# Patient Record
Sex: Female | Born: 1935
Health system: Southern US, Community
[De-identification: ages and names within clinical notes are randomized; demographics above are authoritative.]

## PROBLEM LIST (undated history)

## (undated) ENCOUNTER — Emergency Department (HOSPITAL_COMMUNITY): Admission: EM | Payer: Self-pay | Source: Home / Self Care

## (undated) DIAGNOSIS — F329 Major depressive disorder, single episode, unspecified: Secondary | ICD-10-CM

## (undated) DIAGNOSIS — I8393 Asymptomatic varicose veins of bilateral lower extremities: Secondary | ICD-10-CM

## (undated) DIAGNOSIS — H269 Unspecified cataract: Secondary | ICD-10-CM

## (undated) DIAGNOSIS — M545 Low back pain, unspecified: Secondary | ICD-10-CM

## (undated) DIAGNOSIS — F3289 Other specified depressive episodes: Secondary | ICD-10-CM

## (undated) DIAGNOSIS — F411 Generalized anxiety disorder: Secondary | ICD-10-CM

## (undated) DIAGNOSIS — F17200 Nicotine dependence, unspecified, uncomplicated: Secondary | ICD-10-CM

## (undated) DIAGNOSIS — E785 Hyperlipidemia, unspecified: Secondary | ICD-10-CM

## (undated) DIAGNOSIS — K5900 Constipation, unspecified: Secondary | ICD-10-CM

## (undated) DIAGNOSIS — R413 Other amnesia: Secondary | ICD-10-CM

## (undated) DIAGNOSIS — I10 Essential (primary) hypertension: Secondary | ICD-10-CM

## (undated) DIAGNOSIS — E643 Sequelae of rickets: Secondary | ICD-10-CM

## (undated) HISTORY — DX: Essential (primary) hypertension: I10

## (undated) HISTORY — DX: Unspecified cataract: H26.9

## (undated) HISTORY — DX: Low back pain, unspecified: M54.50

## (undated) HISTORY — DX: Generalized anxiety disorder: F41.1

## (undated) HISTORY — DX: Low back pain: M54.5

## (undated) HISTORY — DX: Asymptomatic varicose veins of bilateral lower extremities: I83.93

## (undated) HISTORY — DX: Hyperlipidemia, unspecified: E78.5

## (undated) HISTORY — PX: CYST EXCISION: SHX5701

## (undated) HISTORY — DX: Sequelae of rickets: E64.3

## (undated) HISTORY — DX: Other specified depressive episodes: F32.89

## (undated) HISTORY — DX: Major depressive disorder, single episode, unspecified: F32.9

## (undated) HISTORY — DX: Constipation, unspecified: K59.00

## (undated) HISTORY — DX: Other amnesia: R41.3

## (undated) HISTORY — DX: Nicotine dependence, unspecified, uncomplicated: F17.200

---

## 1998-09-26 ENCOUNTER — Ambulatory Visit (HOSPITAL_COMMUNITY): Admission: RE | Admit: 1998-09-26 | Discharge: 1998-09-26 | Payer: Self-pay | Admitting: Gastroenterology

## 1999-11-08 ENCOUNTER — Encounter: Admission: RE | Admit: 1999-11-08 | Discharge: 1999-11-08 | Payer: Self-pay | Admitting: Family Medicine

## 1999-11-08 ENCOUNTER — Encounter: Payer: Self-pay | Admitting: Family Medicine

## 2000-11-16 ENCOUNTER — Other Ambulatory Visit: Admission: RE | Admit: 2000-11-16 | Discharge: 2000-11-16 | Payer: Self-pay | Admitting: Family Medicine

## 2001-04-08 ENCOUNTER — Encounter: Payer: Self-pay | Admitting: Family Medicine

## 2001-04-08 ENCOUNTER — Encounter: Admission: RE | Admit: 2001-04-08 | Discharge: 2001-04-08 | Payer: Self-pay | Admitting: Family Medicine

## 2001-12-24 ENCOUNTER — Other Ambulatory Visit: Admission: RE | Admit: 2001-12-24 | Discharge: 2001-12-24 | Payer: Self-pay | Admitting: Family Medicine

## 2002-01-13 ENCOUNTER — Encounter: Payer: Self-pay | Admitting: Family Medicine

## 2002-01-13 ENCOUNTER — Encounter: Admission: RE | Admit: 2002-01-13 | Discharge: 2002-01-13 | Payer: Self-pay | Admitting: Family Medicine

## 2002-01-14 ENCOUNTER — Encounter: Admission: RE | Admit: 2002-01-14 | Discharge: 2002-01-14 | Payer: Self-pay | Admitting: Family Medicine

## 2002-01-14 ENCOUNTER — Encounter: Payer: Self-pay | Admitting: Family Medicine

## 2002-02-24 ENCOUNTER — Other Ambulatory Visit: Admission: RE | Admit: 2002-02-24 | Discharge: 2002-02-24 | Payer: Self-pay | Admitting: *Deleted

## 2002-03-10 ENCOUNTER — Ambulatory Visit (HOSPITAL_COMMUNITY): Admission: RE | Admit: 2002-03-10 | Discharge: 2002-03-10 | Payer: Self-pay | Admitting: Gastroenterology

## 2002-03-10 ENCOUNTER — Encounter (INDEPENDENT_AMBULATORY_CARE_PROVIDER_SITE_OTHER): Payer: Self-pay | Admitting: Specialist

## 2002-04-20 ENCOUNTER — Ambulatory Visit: Admission: RE | Admit: 2002-04-20 | Discharge: 2002-04-20 | Payer: Self-pay | Admitting: *Deleted

## 2002-06-16 ENCOUNTER — Ambulatory Visit (HOSPITAL_COMMUNITY): Admission: RE | Admit: 2002-06-16 | Discharge: 2002-06-17 | Payer: Self-pay | Admitting: *Deleted

## 2002-06-16 ENCOUNTER — Encounter (INDEPENDENT_AMBULATORY_CARE_PROVIDER_SITE_OTHER): Payer: Self-pay | Admitting: *Deleted

## 2003-02-17 ENCOUNTER — Encounter: Payer: Self-pay | Admitting: Family Medicine

## 2003-02-17 ENCOUNTER — Encounter: Admission: RE | Admit: 2003-02-17 | Discharge: 2003-02-17 | Payer: Self-pay | Admitting: Family Medicine

## 2004-01-24 ENCOUNTER — Other Ambulatory Visit: Admission: RE | Admit: 2004-01-24 | Discharge: 2004-01-24 | Payer: Self-pay | Admitting: Family Medicine

## 2005-03-19 ENCOUNTER — Encounter (INDEPENDENT_AMBULATORY_CARE_PROVIDER_SITE_OTHER): Payer: Self-pay | Admitting: Specialist

## 2005-03-19 ENCOUNTER — Ambulatory Visit (HOSPITAL_COMMUNITY): Admission: RE | Admit: 2005-03-19 | Discharge: 2005-03-19 | Payer: Self-pay | Admitting: Gastroenterology

## 2005-05-12 ENCOUNTER — Ambulatory Visit (HOSPITAL_BASED_OUTPATIENT_CLINIC_OR_DEPARTMENT_OTHER): Admission: RE | Admit: 2005-05-12 | Discharge: 2005-05-13 | Payer: Self-pay | Admitting: Otolaryngology

## 2005-05-12 ENCOUNTER — Ambulatory Visit (HOSPITAL_COMMUNITY): Admission: RE | Admit: 2005-05-12 | Discharge: 2005-05-12 | Payer: Self-pay | Admitting: Otolaryngology

## 2005-05-12 ENCOUNTER — Encounter (INDEPENDENT_AMBULATORY_CARE_PROVIDER_SITE_OTHER): Payer: Self-pay | Admitting: *Deleted

## 2005-06-24 ENCOUNTER — Encounter: Admission: RE | Admit: 2005-06-24 | Discharge: 2005-06-24 | Payer: Self-pay | Admitting: Family Medicine

## 2005-07-11 ENCOUNTER — Encounter: Admission: RE | Admit: 2005-07-11 | Discharge: 2005-07-11 | Payer: Self-pay | Admitting: Family Medicine

## 2006-01-12 ENCOUNTER — Encounter: Admission: RE | Admit: 2006-01-12 | Discharge: 2006-01-12 | Payer: Self-pay | Admitting: Family Medicine

## 2006-09-29 ENCOUNTER — Encounter: Admission: RE | Admit: 2006-09-29 | Discharge: 2006-09-29 | Payer: Self-pay | Admitting: Family Medicine

## 2006-10-06 ENCOUNTER — Encounter: Admission: RE | Admit: 2006-10-06 | Discharge: 2006-10-06 | Payer: Self-pay | Admitting: Family Medicine

## 2006-10-06 ENCOUNTER — Encounter (INDEPENDENT_AMBULATORY_CARE_PROVIDER_SITE_OTHER): Payer: Self-pay | Admitting: Specialist

## 2007-10-14 ENCOUNTER — Encounter: Admission: RE | Admit: 2007-10-14 | Discharge: 2007-10-14 | Payer: Self-pay | Admitting: Family Medicine

## 2008-11-01 ENCOUNTER — Encounter: Admission: RE | Admit: 2008-11-01 | Discharge: 2008-11-01 | Payer: Self-pay | Admitting: Family Medicine

## 2008-11-07 ENCOUNTER — Encounter: Admission: RE | Admit: 2008-11-07 | Discharge: 2008-11-07 | Payer: Self-pay | Admitting: Family Medicine

## 2009-08-09 ENCOUNTER — Encounter: Admission: RE | Admit: 2009-08-09 | Discharge: 2009-08-09 | Payer: Self-pay | Admitting: Internal Medicine

## 2009-09-16 ENCOUNTER — Emergency Department (HOSPITAL_COMMUNITY): Admission: EM | Admit: 2009-09-16 | Discharge: 2009-09-16 | Payer: Self-pay | Admitting: Emergency Medicine

## 2009-09-19 ENCOUNTER — Inpatient Hospital Stay (HOSPITAL_COMMUNITY): Admission: EM | Admit: 2009-09-19 | Discharge: 2009-09-24 | Payer: Self-pay | Admitting: Emergency Medicine

## 2010-06-29 ENCOUNTER — Encounter: Payer: Self-pay | Admitting: *Deleted

## 2010-06-30 ENCOUNTER — Encounter: Payer: Self-pay | Admitting: Family Medicine

## 2010-08-27 LAB — CBC
Hemoglobin: 12 g/dL (ref 12.0–15.0)
MCHC: 36.8 g/dL — ABNORMAL HIGH (ref 30.0–36.0)
MCV: 91.6 fL (ref 78.0–100.0)
MCV: 93.4 fL (ref 78.0–100.0)
Platelets: 175 10*3/uL (ref 150–400)
RBC: 3.46 MIL/uL — ABNORMAL LOW (ref 3.87–5.11)
RDW: 14.6 % (ref 11.5–15.5)

## 2010-08-27 LAB — BASIC METABOLIC PANEL
BUN: 10 mg/dL (ref 6–23)
BUN: 9 mg/dL (ref 6–23)
CO2: 23 mEq/L (ref 19–32)
Calcium: 8.5 mg/dL (ref 8.4–10.5)
Chloride: 106 mEq/L (ref 96–112)
Chloride: 108 mEq/L (ref 96–112)
Creatinine, Ser: 0.89 mg/dL (ref 0.4–1.2)
GFR calc Af Amer: >60 mL/min (ref >60)
GFR calc Af Amer: >60 mL/min (ref >60)
Glucose, Bld: 103 mg/dL — ABNORMAL HIGH (ref 70–99)
Glucose, Bld: 110 mg/dL — ABNORMAL HIGH (ref 70–99)
Sodium: 138 mEq/L (ref 135–145)
Sodium: 139 mEq/L (ref 135–145)

## 2010-08-27 LAB — LIPASE, BLOOD
Lipase: 261 U/L — ABNORMAL HIGH (ref 11–59)
Lipase: 272 U/L — ABNORMAL HIGH (ref 11–59)

## 2010-08-28 LAB — COMPREHENSIVE METABOLIC PANEL
ALT: 18 U/L (ref 0–35)
ALT: 41 U/L — ABNORMAL HIGH (ref 0–35)
AST: 74 U/L — ABNORMAL HIGH (ref 0–37)
Albumin: 2.2 g/dL — ABNORMAL LOW (ref 3.5–5.2)
Albumin: 2.3 g/dL — ABNORMAL LOW (ref 3.5–5.2)
Alkaline Phosphatase: 41 U/L (ref 39–117)
Alkaline Phosphatase: 43 U/L (ref 39–117)
Alkaline Phosphatase: 45 U/L (ref 39–117)
Alkaline Phosphatase: 55 U/L (ref 39–117)
BUN: 21 mg/dL (ref 6–23)
BUN: 23 mg/dL (ref 6–23)
BUN: 38 mg/dL — ABNORMAL HIGH (ref 6–23)
CO2: 21 mEq/L (ref 19–32)
CO2: 24 mEq/L (ref 19–32)
CO2: 25 mEq/L (ref 19–32)
Calcium: 9 mg/dL (ref 8.4–10.5)
Chloride: 101 mEq/L (ref 96–112)
Chloride: 105 mEq/L (ref 96–112)
Chloride: 99 mEq/L (ref 96–112)
GFR calc Af Amer: 34 mL/min — ABNORMAL LOW (ref >60)
GFR calc non Af Amer: 28 mL/min — ABNORMAL LOW (ref >60)
GFR calc non Af Amer: 29 mL/min — ABNORMAL LOW (ref >60)
GFR calc non Af Amer: 29 mL/min — ABNORMAL LOW (ref >60)
Glucose, Bld: 103 mg/dL — ABNORMAL HIGH (ref 70–99)
Glucose, Bld: 118 mg/dL — ABNORMAL HIGH (ref 70–99)
Glucose, Bld: 118 mg/dL — ABNORMAL HIGH (ref 70–99)
Glucose, Bld: 125 mg/dL — ABNORMAL HIGH (ref 70–99)
Potassium: 3.3 mEq/L — ABNORMAL LOW (ref 3.5–5.1)
Potassium: 3.3 mEq/L — ABNORMAL LOW (ref 3.5–5.1)
Potassium: 3.4 mEq/L — ABNORMAL LOW (ref 3.5–5.1)
Potassium: 3.4 mEq/L — ABNORMAL LOW (ref 3.5–5.1)
Sodium: 132 mEq/L — ABNORMAL LOW (ref 135–145)
Sodium: 135 mEq/L (ref 135–145)
Total Bilirubin: 0.3 mg/dL (ref 0.3–1.2)
Total Bilirubin: 0.6 mg/dL (ref 0.3–1.2)
Total Bilirubin: 0.8 mg/dL (ref 0.3–1.2)
Total Protein: 8.1 g/dL (ref 6.0–8.3)

## 2010-08-28 LAB — LEGIONELLA ANTIGEN, URINE

## 2010-08-28 LAB — APTT: aPTT: 31 seconds (ref 24–37)

## 2010-08-28 LAB — CBC
HCT: 36.9 % (ref 36.0–46.0)
HCT: 42.3 % (ref 36.0–46.0)
Hemoglobin: 12.7 g/dL (ref 12.0–15.0)
Hemoglobin: 13.9 g/dL (ref 12.0–15.0)
Hemoglobin: 14.2 g/dL (ref 12.0–15.0)
MCHC: 33.7 g/dL (ref 30.0–36.0)
Platelets: 133 10*3/uL — ABNORMAL LOW (ref 150–400)
RBC: 3.98 MIL/uL (ref 3.87–5.11)
RBC: 4.57 MIL/uL (ref 3.87–5.11)
RBC: 4.62 MIL/uL (ref 3.87–5.11)
RDW: 13.6 % (ref 11.5–15.5)
WBC: 6.8 10*3/uL (ref 4.0–10.5)
WBC: 8 10*3/uL (ref 4.0–10.5)

## 2010-08-28 LAB — URINALYSIS, ROUTINE W REFLEX MICROSCOPIC
Bilirubin Urine: NEGATIVE
Nitrite: NEGATIVE
Specific Gravity, Urine: 1.023 (ref 1.005–1.030)
pH: 6 (ref 5.0–8.0)

## 2010-08-28 LAB — CULTURE, BLOOD (ROUTINE X 2)
Culture: NO GROWTH
Culture: NO GROWTH

## 2010-08-28 LAB — PROTEIN ELECTROPHORESIS, SERUM
Alpha-1-Globulin: 8.6 % — ABNORMAL HIGH (ref 2.9–4.9)
Alpha-2-Globulin: 18.1 % — ABNORMAL HIGH (ref 7.1–11.8)
Beta 2: 8.3 % — ABNORMAL HIGH (ref 3.2–6.5)
Gamma Globulin: 18 % (ref 11.1–18.8)

## 2010-08-28 LAB — CARDIAC PANEL(CRET KIN+CKTOT+MB+TROPI)
Relative Index: 1.6 (ref 0.0–2.5)
Troponin I: 0.01 ng/mL (ref 0.00–0.06)

## 2010-08-28 LAB — DIFFERENTIAL
Basophils Absolute: 0 10*3/uL (ref 0.0–0.1)
Basophils Absolute: 0 10*3/uL (ref 0.0–0.1)
Basophils Relative: 0 % (ref 0–1)
Basophils Relative: 0 % (ref 0–1)
Basophils Relative: 0 % (ref 0–1)
Eosinophils Absolute: 0 10*3/uL (ref 0.0–0.7)
Eosinophils Absolute: 0 10*3/uL (ref 0.0–0.7)
Eosinophils Relative: 0 % (ref 0–5)
Lymphs Abs: 1.6 10*3/uL (ref 0.7–4.0)
Monocytes Absolute: 0.2 10*3/uL (ref 0.1–1.0)
Monocytes Relative: 2 % — ABNORMAL LOW (ref 3–12)
Neutro Abs: 12.4 10*3/uL — ABNORMAL HIGH (ref 1.7–7.7)
Neutro Abs: 5 10*3/uL (ref 1.7–7.7)
Neutrophils Relative %: 77 % (ref 43–77)
Neutrophils Relative %: 84 % — ABNORMAL HIGH (ref 43–77)

## 2010-08-28 LAB — LIPASE, BLOOD: Lipase: 325 U/L — ABNORMAL HIGH (ref 11–59)

## 2010-08-28 LAB — BASIC METABOLIC PANEL
BUN: 39 mg/dL — ABNORMAL HIGH (ref 6–23)
CO2: 24 mEq/L (ref 19–32)
Glucose, Bld: 129 mg/dL — ABNORMAL HIGH (ref 70–99)
Potassium: 3.4 mEq/L — ABNORMAL LOW (ref 3.5–5.1)
Sodium: 130 mEq/L — ABNORMAL LOW (ref 135–145)

## 2010-08-28 LAB — EXPECTORATED SPUTUM ASSESSMENT W GRAM STAIN, RFLX TO RESP C

## 2010-08-28 LAB — CULTURE, RESPIRATORY W GRAM STAIN: Culture: NORMAL

## 2010-08-28 LAB — GLUCOSE, CAPILLARY

## 2010-08-28 LAB — URINE MICROSCOPIC-ADD ON

## 2010-08-28 LAB — UIFE/LIGHT CHAINS/TP QN, 24-HR UR
Alpha 2, Urine: DETECTED — AB
Beta, Urine: DETECTED — AB
Free Kappa Lt Chains,Ur: 25.5 mg/dL — ABNORMAL HIGH (ref 0.04–1.51)
Free Lt Chn Excr Rate: 306 mg/d

## 2010-08-28 LAB — TROPONIN I: Troponin I: 0.01 ng/mL (ref 0.00–0.06)

## 2010-08-28 LAB — URINE CULTURE

## 2010-08-28 LAB — HEPATITIS B SURFACE ANTIGEN: Hepatitis B Surface Ag: NEGATIVE

## 2010-10-01 ENCOUNTER — Inpatient Hospital Stay (INDEPENDENT_AMBULATORY_CARE_PROVIDER_SITE_OTHER)
Admission: RE | Admit: 2010-10-01 | Discharge: 2010-10-01 | Disposition: A | Discharge status: Home / Self Care | Payer: Medicare Other | Source: Ambulatory Visit | Attending: Emergency Medicine | Admitting: Emergency Medicine

## 2010-10-01 DIAGNOSIS — S139XXA Sprain of joints and ligaments of unspecified parts of neck, initial encounter: Secondary | ICD-10-CM

## 2010-10-01 DIAGNOSIS — S335XXA Sprain of ligaments of lumbar spine, initial encounter: Secondary | ICD-10-CM

## 2010-10-01 DIAGNOSIS — S239XXA Sprain of unspecified parts of thorax, initial encounter: Secondary | ICD-10-CM

## 2010-10-25 NOTE — Op Note (Signed)
NAME:  Carol [name], Carol [name]                    ACCOUNT NO.:  [id number]   MEDICAL RECORD NO.:  [id number]                   PATIENT TYPE:  AMB   LOCATION:  ENDO                                 FACILITY:  [facility]   PHYSICIAN:  Charna Elizabeth, M.D.                   DATE OF BIRTH:  1936-02-08   DATE OF PROCEDURE:  03/10/2002  DATE OF DISCHARGE:                                 OPERATIVE REPORT   PROCEDURE PERFORMED:  Colonoscopy with biopsies.   ENDOSCOPIST:  Charna Elizabeth, M.D.   INSTRUMENT USED:  Olympus video colonoscope.   INDICATIONS FOR PROCEDURE:  The patient is a 75 year old African-American  female with a history of adenomatous polyps removed three years ago.  Rule  out recurrent polyps.   PREPROCEDURE PREPARATION:  Informed consent was procured from the patient.  The patient was fasted for eight hours prior to the procedure and prepped  with a bottle of magnesium citrate and a gallon of NuLytely the night prior  to the procedure.   PREPROCEDURE PHYSICAL:  The patient had stable vital signs.  Neck supple.  Chest clear to auscultation.  S1 and S2 regular.  Abdomen soft with normal  bowel sounds.   DESCRIPTION OF PROCEDURE:  The patient was placed in left lateral decubitus  position and sedated with 60 mg of Demerol and 6 mg of Versed intravenously.  Once the patient was adequately sedated and maintained on low flow oxygen  and continuous cardiac monitoring, the Olympus video colonoscope was  advanced from the rectum to the cecum with difficulty.  The patient had a  very tortuous colon and the patient's position had to be changed from the  left lateral to supine and the right lateral position to adequately  visualize the cecal base.  Abdominal pressure was applied in different  locations. There was difficulty with entry of the scope into the cecum.  The  appendicular orifice and ileocecal valve appeared healthy and without  lesions.  A small sessile polyp was biopsied from the  proximal right colon.  The patient had a very tortuous colon.  There was no evidence of large  masses, polyps or diverticulosis.  The patient tolerated the procedure well  without complications.  No abnormalities were noted on retroflexion in the  rectum.   IMPRESSION:  1. A small sessile polyp biopsied from proximal right colon.  2. Very tortuous colon.  No diverticulosis, large masses or polyps seen.   RECOMMENDATIONS:  1. Await biopsy results.  2.     Repeat colorectal cancer screening in the next five years unless the patient     develops any abnormal symptoms in the interim.  3. Outpatient follow-up on a p.r.n. basis.  Charna Elizabeth, M.D.    JM/MEDQ  D:  03/10/2002  T:  03/11/2002  Job:  [id number]   cc:   Talmadge Coventry, M.D.

## 2010-10-25 NOTE — Op Note (Signed)
Carol [name], Carol [name]          ACCOUNT NO.:  [id number]   MEDICAL RECORD NO.:  [id number]          PATIENT TYPE:  AMB   LOCATION:  DSC                          FACILITY:  [facility]   PHYSICIAN:  Jefry H. Pollyann Kennedy, MD     DATE OF BIRTH:  03/16/1936   DATE OF PROCEDURE:  05/12/2005  DATE OF DISCHARGE:                                 OPERATIVE REPORT   PREOPERATIVE DIAGNOSIS:  Left parotid mass.   POSTOPERATIVE DIAGNOSIS:  Left parotid mass.   PROCEDURE:  Left superficial parotidectomy.   SURGEON:  Jefry H. Pollyann Kennedy, MD.   ASSISTANT:  Kinnie Scales. Annalee Genta, M.D.   ANESTHESIA:  General endotracheal.   COMPLICATIONS:  No complications.   BLOOD LOSS:  50 cc.   FINDINGS:  Large soft mass arising from the tail of the parotid on the left  side, inferior and deep to the mandibular branch of the facial nerve. No  complications.   REFERRING PHYSICIAN:  Talmadge Coventry, M.D.   HISTORY:  A 75 year old lady with an enlarging mass in the left parotid. 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 This one had  increase in size recently. Risks, benefits, alternatives and complications  of the procedure were explained to the patient's; she seemed to understand  and agreed to surgery.   PROCEDURE:  The patient was taken to the operating room, placed on the  operating table in a supine position. Following induction of general  endotracheal anesthesia, the table was turned 90 degrees. The patient was  positioned for parotid surgery. The face was prepped and draped in a sterile  fashion. An S-shaped incision was outlined the preauricular crease behind  the lobule, and then around the mastoid tip -- down to a cervical crease two  fingerbreadths below the angle of the mandible. Electrocautery was used to  incise through the skin and the superficial fascia. A flap was developed  anteriorly superficial to the  parotid fascia. The greater auricular nerve  posterior branch was identified, dissected and preserved. The parotid gland  tissue was dissected off of the upper sternocleidomastoid muscle. The gland  was then dissected off of the ear canal cartilage and down to the  tympanomeatal suture line. The posterior belly of the digastric was  identified. The main trunk of the facial nerve was then identified, and  dissection was then began following the main trunk to the pes anserinus. We  used McCabe dissector to dissect right down onto the lateral aspect of the  nerve, and the bipolar cautery used to cauterize the glandular tissue.  Superficial sharp dissection was used to divide the superficial from the  deep lobe at the plane of the nerve. The upper branches were dissected  first, followed by the lower branches -- all the way down to the tail of  parotid, where the tumor was identified. The tumor was then dissected out of  the surrounding tissue using blunt dissection. The entire specimen was then  sent for pathologic evaluation. Several vessels were encountered during the  dissection and were tied using silk ligatures. The wound was inspected for  bleeding and hemostasis was completed. The wound was then irrigated with  saline. A nerve stimulator was used to stimulate the main trunk and the  upper and lower division, and there was good stimulation in all branches.  The wound was closed in layers using 3-0 chromic on the deep layer and  running 5-0 nylon on the skin. A 7-French round JP drain was left in the  wound, exited through the inferior aspect of the incision, and secured in  place with nylon suture. Bacitracin was applied. The patient was then  awakened, extubated and transferred to recovery.      Jefry H. Pollyann Kennedy, MD  Electronically Signed     JHR/MEDQ  D:  05/12/2005  T:  05/12/2005  Job:  [id number]   cc:   Talmadge Coventry, M.D.  Fax: [phone]

## 2010-10-25 NOTE — Op Note (Signed)
Carol [name], Carol [name] NO.:  [id number]   MEDICAL RECORD NO.:  [id number]          PATIENT TYPE:  AMB   LOCATION:  ENDO                         FACILITY:  [facility]   PHYSICIAN:  Anselmo Rod, M.D.  DATE OF BIRTH:  1936-01-05   DATE OF PROCEDURE:  03/19/2005  DATE OF DISCHARGE:  03/19/2005                                 OPERATIVE REPORT   PROCEDURE PERFORMED:  Colonoscopy with biopsies.   ENDOSCOPIST:  Anselmo Rod, M.D.   INSTRUMENT USED:  Olympus video colonoscope.   INDICATIONS FOR PROCEDURE:  A 75 year old female undergoing a screening  coloscopy to rule out colonic polyps, masses, etc.  The patient has a  personal history of tubular adenomas in the colon, occasional rectal  bleeding, rule out recurrent polyps, masses, ect.   PRE-PROCEDURE PREPARATION:  Informed consent was procured from the patient.  The patient was fasted for eight hours prior to the procedure and prepped  with a bottle of magnesium citrate and a gallon of GoLYTELY the night prior  to the procedure.  The risks and benefits of the procedure including a 10%  misread of cancer and polyps were discussed with the patient as well.   PRE-PROCEDURE PHYSICAL:  The patient had stable vital signs.  Neck supple.  Chest clear to auscultation.  S1 S2 regular.  Abdomen soft with normal bowel  sounds.   DESCRIPTION OF THE PROCEDURE:  The patient was placed in the left lateral  decubitus position and sedated with 80 mg of Demerol and 9 mg of Versed in  slow incremental doses.  Once the patient was adequately sedated and  maintained on low flow oxygen and continuous cardiac monitoring, the Olympus  video colonoscope was advanced from the rectum to the cecum.  A small  sessile polyp was biopsied in the rectosigmoid colon.  One cecal polyp was  biopsied as well.  Four small sessile polyps were biopsied from the proximal  right colon.  The cecal polyp and the polyps from the proximal right colon  were  placed in one bottle.  Retroflexion in the rectum revealed small  internal hemorrhoids.  No large masses or polyps seen.  There was no  evidence of diverticulosis.  The patient tolerated the procedure well  without immediate complications.   IMPRESSION:  1.  Small internal hemorrhoids.  2.  Small sessile polyp biopsied from the rectosigmoid colon, four from the      proximal right colon and one from the cecum.   RECOMMENDATIONS:  1.  Await pathology results.  2.  Repeat colonoscopy depending on pathology results.  3.  Avoid all nonsteroidals including aspirin for the next two weeks.  4.  Continue a high fiber diet with liberal fluid intake.  5.  Outpatient followup as need arises in the future.      Anselmo Rod, M.D.  Electronically Signed     JNM/MEDQ  D:  03/21/2005  T:  03/21/2005  Job:  [id number]   cc:   Talmadge Coventry, M.D.  Fax: [phone]

## 2010-10-25 NOTE — Op Note (Signed)
NAME:  Carol [name], Carol [name]                    ACCOUNT NO.:  [id number]   MEDICAL RECORD NO.:  [id number]                   PATIENT TYPE:  OIB   LOCATION:  2550                                 FACILITY:  [facility]   PHYSICIAN:  Veverly Fells. Arletha Grippe, M.D.             DATE OF BIRTH:  10-30-1935   DATE OF PROCEDURE:  06/16/2002  DATE OF DISCHARGE:                                 OPERATIVE REPORT   PREOPERATIVE DIAGNOSIS:  Right parotid neoplasm.   POSTOPERATIVE DIAGNOSIS:  Right parotid neoplasm.   PROCEDURE:  Right lateral parotidectomy with facial nerve preservation and  monitoring of facial nerve with the XOMED MIMS facial nerve monitor for two  hours.   SURGEON:  Veverly Fells. Arletha Grippe, M.D.   ASSISTANT:  Kinnie Scales. Annalee Genta, M.D.   ANESTHESIA:  General endotracheal.   INDICATIONS:  This is a 75 year old black female, patient of Dr. Talmadge Coventry, with a history of a slowly enlarging mass involving the right  parotid gland for the past 6-12 months.  This is confirmed on examination  and by MRI of the neck which did show about a 3 x 4 cm mass which involving  the tail of the parotid gland.  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  Based on her history and physical examination,  I have recommended proceeding with the above noted surgical procedure.  I  have discussed extensively with the patient and the family the risks and  benefits of the surgery, including anesthesia, infection, bleeding, injury  to facial nerve and numbness of the face and earlobe.  I also discussed with  them the type of incision we would use, the type of drain we would use and  normal recovery period expected after this type of surgery.  We entertained  any questions and answered them appropriately.  Informed was obtained, and  the patient was consented for the procedure.   OPERATIVE FINDINGS:  Large benign-appearing mass involving the tail of the  parotid gland  approximately 3 x 4 cm dimension.  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   DESCRIPTION OF PROCEDURE:  The patient was brought into the operating room  and in the placed supine position.  General endotracheal anesthesia was  administered via the anesthesiologist without complications.  The shoulders  were placed on a shoulder roller.  Head was placed in a donut.  Head of the  table was turned 90 degrees such that the right side was exposed.  The XOMED  MIMS monitor was then hooked up to the patient per protocol to monitor the  function of the facial nerve on the right side.  After this was done, the  patient's right face and neck were sterilely prepped and draped in the  standard fashion.  A doubly modified Blair incision was then marked out over  the patient's right neck and was injected with a total of 10 cubic  centimeters  of 1% lidocaine solution, 1:100,000 epinephrine.  The facial  incision was made with the scalpel.  Light dissection was carried down  through the subcutaneous tissue until parotid fascia was identified.  Standard face lift flap was then elevated anteriorly using sharp dissection  and then down inferiorly to expose anterior corner of SCM muscle.  A little  bit of dissection was also carried out posteriorly.  The large tumor which  involved the tail of the parotid gland was then sharply excised off of the  anterior border of the SCM and off inferiorly and posteriorly off of the  posterior biliary digastric muscle.  Pharyngeal cartilage was identified and  parotid tissue was then both bluntly and sharply divided off the tracheal  cartilage until all mastoid suture was identified in this area.  The main  branch of the facial nerve was identified and was stimulated with a XOMED  MIMS monitor with good response.  The main branch was then dissected from  the parotid mass, and the upper division but was not traced out anteriorly  because of the location of the  tumor.  All three branches of the inferior  division were traced out entirely throughout the substance of the gland.  The gland was divided sharply overlying the inferior branch of the facial  nerve and no branches were sacrificed.  After this was done, the posterior  facial vein was identified and was divided between hemostats and tied with 3-  0 silk ties.  The great auricular nerve was also divided inferiorly and  sharply.  The tumor and portion of surrounding normal gland were then  excised sharply not to injure the facial nerve, and the specimen was then  removed and sent to surgical pathology for frozen section analysis.  This  came back consistent with a Warthin's tumor. Bleeding was controlled with  bipolar cautery.  The wound was then irrigated with copious amounts of  irrigation.  Fluid suctioned dry.  There was no evidence of any active  bleeding.  The main branch in all divisions that were exposed to the fascial  nerve were stimulated with the XOMED MIMS facial nerve stimulator with good  response of all branches.  After this was done, a fully fluted 10 mm Blake  drain was placed within the wound, brought up through a separate stab  incision posteriorly and then was sutured to the neck skin with a 2-0 silk  stitch.  The flap was then placed back in normal position.  The incision was  closed in multiple interrupted 4-0 Vicryl sutures and final skin closure was  achieved with a combination of running interlocking 5-0 and 4-0 nylon  stitch.  Bacitracin ointment was placed over the wound.  Fluids given during  the procedure were approximately 1100 cubic centimeters crystalloid.  Estimated blood loss was less than 50 cubic centimeters.  Urine output was  not measured.  The above noted closed suction drain was placed.  The  specimen sent was the right lateral parotid gland, and there were no packs.  The patient tolerated the procedure well without complications and was extubated in  the operating room and transferred to recovery in stable  condition.  Sponge, needle and instrument counts were correct at the end of  the procedure.  Total duration of the procedure is approximately 2-1/2  hours.   DISCHARGE INSTRUCTIONS:  The patient will be admitted for overnight  recovery.  Once determined well she will be sent home on June 17, 2002.  She is to have regular activity and diet after surgery.  Both her and her  family will be given oral and written instructions.  They are to call for  any problems with bleeding, fever, vomiting or pain, for any extra  medications, or any questions.  She will follow up in the office for suture  removal and postoperative check on June 23, 2001, at 1:15 p.m.   DISCHARGE MEDICATIONS:  1. She will be sent home on Levaquin 500 mg p.o. q.d. x 10 days.  2. Vicodin #30 two refills one to two tablets q.4h. p.r.n.                                               Veverly Fells. Arletha Grippe, M.D.    MDR/MEDQ  D:  06/16/2002  T:  06/16/2002  Job:  [id number]   cc:   Talmadge Coventry, M.D.  [address]  [place]  [place] [address]  Fax: [phone]

## 2010-10-26 ENCOUNTER — Inpatient Hospital Stay (INDEPENDENT_AMBULATORY_CARE_PROVIDER_SITE_OTHER)
Admission: RE | Admit: 2010-10-26 | Discharge: 2010-10-26 | Disposition: A | Discharge status: Home / Self Care | Payer: BC Managed Care – PPO | Source: Ambulatory Visit | Attending: Family Medicine | Admitting: Family Medicine

## 2010-10-26 DIAGNOSIS — N390 Urinary tract infection, site not specified: Secondary | ICD-10-CM

## 2010-10-26 LAB — POCT URINALYSIS DIP (DEVICE)
Nitrite: POSITIVE — AB
Protein, ur: >=300 mg/dL — AB
Urobilinogen, UA: 0.2 mg/dL (ref 0.0–1.0)

## 2011-12-04 ENCOUNTER — Other Ambulatory Visit: Payer: Self-pay | Admitting: Internal Medicine

## 2011-12-04 DIAGNOSIS — Z780 Asymptomatic menopausal state: Secondary | ICD-10-CM

## 2011-12-19 ENCOUNTER — Other Ambulatory Visit: Payer: Medicare Other

## 2011-12-22 ENCOUNTER — Ambulatory Visit
Admission: RE | Admit: 2011-12-22 | Discharge: 2011-12-22 | Disposition: A | Discharge status: Home / Self Care | Payer: Medicare Other | Source: Ambulatory Visit | Attending: Internal Medicine | Admitting: Internal Medicine

## 2011-12-22 DIAGNOSIS — Z780 Asymptomatic menopausal state: Secondary | ICD-10-CM

## 2011-12-22 LAB — HM DEXA SCAN

## 2012-04-06 ENCOUNTER — Other Ambulatory Visit: Payer: Self-pay | Admitting: Internal Medicine

## 2012-04-06 DIAGNOSIS — Z1231 Encounter for screening mammogram for malignant neoplasm of breast: Secondary | ICD-10-CM

## 2012-05-10 ENCOUNTER — Ambulatory Visit
Admission: RE | Admit: 2012-05-10 | Discharge: 2012-05-10 | Disposition: A | Discharge status: Home / Self Care | Payer: Medicare Other | Source: Ambulatory Visit | Attending: Internal Medicine | Admitting: Internal Medicine

## 2012-05-10 DIAGNOSIS — Z1231 Encounter for screening mammogram for malignant neoplasm of breast: Secondary | ICD-10-CM

## 2012-05-10 LAB — HM MAMMOGRAPHY: HM Mammogram: NORMAL

## 2013-01-24 ENCOUNTER — Encounter: Payer: Self-pay | Admitting: *Deleted

## 2013-01-24 ENCOUNTER — Other Ambulatory Visit: Payer: Self-pay | Admitting: *Deleted

## 2013-01-25 ENCOUNTER — Encounter: Payer: Self-pay | Admitting: Internal Medicine

## 2013-01-25 ENCOUNTER — Other Ambulatory Visit: Payer: Self-pay | Admitting: Internal Medicine

## 2013-01-25 ENCOUNTER — Ambulatory Visit (INDEPENDENT_AMBULATORY_CARE_PROVIDER_SITE_OTHER): Payer: 59 | Admitting: Internal Medicine

## 2013-01-25 VITALS — BP 136/82 | HR 78 | Temp 98.2°F | Resp 14 | Wt 173.6 lb

## 2013-01-25 DIAGNOSIS — Z720 Tobacco use: Secondary | ICD-10-CM

## 2013-01-25 DIAGNOSIS — K5900 Constipation, unspecified: Secondary | ICD-10-CM

## 2013-01-25 DIAGNOSIS — I10 Essential (primary) hypertension: Secondary | ICD-10-CM

## 2013-01-25 DIAGNOSIS — F0150 Vascular dementia without behavioral disturbance: Secondary | ICD-10-CM

## 2013-01-25 DIAGNOSIS — F0280 Dementia in other diseases classified elsewhere without behavioral disturbance: Secondary | ICD-10-CM

## 2013-01-25 DIAGNOSIS — M948X9 Other specified disorders of cartilage, unspecified sites: Secondary | ICD-10-CM

## 2013-01-25 DIAGNOSIS — F17200 Nicotine dependence, unspecified, uncomplicated: Secondary | ICD-10-CM

## 2013-01-25 DIAGNOSIS — E785 Hyperlipidemia, unspecified: Secondary | ICD-10-CM

## 2013-01-25 MED ORDER — POLYETHYLENE GLYCOL 3350 17 GM/SCOOP PO POWD: 17.0000 g | Freq: Every day | ORAL | Status: DC

## 2013-01-25 MED ORDER — POLYETHYLENE GLYCOL 3350 17 GM/SCOOP PO POWD: 17.0000 g | Freq: Two times a day (BID) | ORAL | Status: DC | PRN

## 2013-01-25 NOTE — Patient Instructions (Signed)
No driving until I see her in the office next and she has cleared the driving test

## 2013-01-25 NOTE — Progress Notes (Signed)
Patient ID: Carol [name], female   DOB: 06/12/1935, 77 y.o.   MRN: [id number]  Code status- full code  HPI 77 y/o female patient seen today after 10 months. Her daughters are here with her today. She denies any complaints. She has hx of dementia but is able to carry out pleasant conversation Family noticed that she has been forgetting directions while driving. No recent road accidents. She has been more repetitive recently with family members She is going days without bathing or changing her clothes and refuses to admit it. Daughters are concerned for her No falls reported She lives at home with her husband in one level housing  Review of Systems  Constitutional: Negative for fever, chills, malaise/fatigue and diaphoresis.  HENT: Positive for congestion. Negative for ear pain and sore throat.   Eyes: Negative for blurred vision and double vision.       Wears corrective lenses  Respiratory: Negative for cough and shortness of breath.   Cardiovascular: Negative for chest pain, palpitations, claudication and leg swelling.  Gastrointestinal: Negative for heartburn, nausea, vomiting, abdominal pain and constipation.  Genitourinary: Negative for dysuria and flank pain.  Musculoskeletal: Negative for joint pain and falls.  Skin: Negative for rash.  Neurological: Negative for dizziness, seizures, loss of consciousness and headaches.  Psychiatric/Behavioral: Positive for memory loss. Negative for depression. The patient is not nervous/anxious and does not have insomnia.      BP 136/82  Pulse 78  Temp(Src) 98.2 F (36.8 C) (Oral)  Resp 14  Wt 173 lb 9.6 oz (78.744 kg)  Physical Exam  Constitutional: She is oriented to person, place, and time. She appears well-developed and well-nourished. No distress.  HENT:  Head: Normocephalic and atraumatic.  Mouth/Throat: Oropharynx is clear and moist.  Eyes: Conjunctivae and EOM are normal. Pupils are equal, round, and reactive to light.   Neck: Normal range of motion. Neck supple. No JVD present.  Cardiovascular: Normal rate, regular rhythm, normal heart sounds and intact distal pulses.   No murmur heard. Pulmonary/Chest: Effort normal and breath sounds normal. No respiratory distress. She has no wheezes. She has no rales. She exhibits no tenderness.  Abdominal: Soft. Bowel sounds are normal. She exhibits no distension and no mass. There is no tenderness. There is no guarding.  Musculoskeletal: Normal range of motion. She exhibits no edema and no tenderness.  Lymphadenopathy:    She has no cervical adenopathy.  Neurological: She is alert and oriented to person, place, and time. No cranial nerve deficit.  Skin: Skin is warm and dry. She is not diaphoretic.  Psychiatric: She has a normal mood and affect. Her behavior is normal.   Assessment/plan  Mixed dementia- worsening recently. Will have her not drive until gives a driving test with DMV office. Will get tsh and b12 level checked to rule out reversible causes. Talked with daughter in detail about course and progression of dementia and her eventually needing assisted living vs nursing home. Pt refuses any assistance at home for now  HTN- continue maxzide for now, monitor bp and will check cmp  Vitamin d def- check vitamin d level. Continue calcium- vitamin d supplement   Constipation- with her being constipated, will add miralax 17 g daily to her current colace regimen  Allergic rhinitis- continue clarinex for now  Hyperlipidemia- check lipid panel  Tobacco user- does not want to quit smoking.   Labs- cbc, cmp, flp, tsh, b12

## 2013-01-26 ENCOUNTER — Encounter: Payer: Self-pay | Admitting: *Deleted

## 2013-01-26 DIAGNOSIS — F17200 Nicotine dependence, unspecified, uncomplicated: Secondary | ICD-10-CM | POA: Insufficient documentation

## 2013-01-26 DIAGNOSIS — Z720 Tobacco use: Secondary | ICD-10-CM | POA: Insufficient documentation

## 2013-01-26 DIAGNOSIS — M948X9 Other specified disorders of cartilage, unspecified sites: Secondary | ICD-10-CM | POA: Insufficient documentation

## 2013-01-26 DIAGNOSIS — F329 Major depressive disorder, single episode, unspecified: Secondary | ICD-10-CM | POA: Insufficient documentation

## 2013-01-26 DIAGNOSIS — I10 Essential (primary) hypertension: Secondary | ICD-10-CM | POA: Insufficient documentation

## 2013-01-26 DIAGNOSIS — F0150 Vascular dementia without behavioral disturbance: Secondary | ICD-10-CM | POA: Insufficient documentation

## 2013-01-26 DIAGNOSIS — K5900 Constipation, unspecified: Secondary | ICD-10-CM | POA: Insufficient documentation

## 2013-01-26 DIAGNOSIS — E785 Hyperlipidemia, unspecified: Secondary | ICD-10-CM | POA: Insufficient documentation

## 2013-01-27 LAB — LIPID PANEL
Cholesterol, Total: 210 mg/dL — ABNORMAL HIGH (ref 100–199)
HDL: 60 mg/dL (ref >39)
Triglycerides: 124 mg/dL (ref 0–149)
VLDL Cholesterol Cal: 25 mg/dL (ref 5–40)

## 2013-01-27 LAB — COMPREHENSIVE METABOLIC PANEL
ALT: 7 IU/L (ref 0–32)
CO2: 24 mmol/L (ref 18–29)
Calcium: 9.6 mg/dL (ref 8.6–10.2)
Chloride: 104 mmol/L (ref 97–108)
Glucose: 86 mg/dL (ref 65–99)
Potassium: 4.2 mmol/L (ref 3.5–5.2)
Total Protein: 7 g/dL (ref 6.0–8.5)

## 2013-01-27 LAB — CBC WITH DIFFERENTIAL/PLATELET
Basos: 1 % (ref 0–3)
Eosinophils Absolute: 0.1 10*3/uL (ref 0.0–0.4)
Immature Grans (Abs): 0 10*3/uL (ref 0.0–0.1)
MCH: 29.9 pg (ref 26.6–33.0)
MCV: 89 fL (ref 79–97)
Monocytes Absolute: 0.5 10*3/uL (ref 0.1–0.9)
Neutrophils Relative %: 47 % (ref 40–74)
RBC: 4.48 x10E6/uL (ref 3.77–5.28)
RDW: 13.9 % (ref 12.3–15.4)

## 2013-03-01 ENCOUNTER — Encounter: Payer: Self-pay | Admitting: Internal Medicine

## 2013-03-01 ENCOUNTER — Ambulatory Visit (INDEPENDENT_AMBULATORY_CARE_PROVIDER_SITE_OTHER): Payer: 59 | Admitting: Internal Medicine

## 2013-03-01 VITALS — BP 140/92 | HR 77 | Temp 98.8°F | Resp 18 | Wt 177.4 lb

## 2013-03-01 DIAGNOSIS — F29 Unspecified psychosis not due to a substance or known physiological condition: Secondary | ICD-10-CM

## 2013-03-01 DIAGNOSIS — F0150 Vascular dementia without behavioral disturbance: Secondary | ICD-10-CM

## 2013-03-01 DIAGNOSIS — I10 Essential (primary) hypertension: Secondary | ICD-10-CM

## 2013-03-01 DIAGNOSIS — K5900 Constipation, unspecified: Secondary | ICD-10-CM

## 2013-03-01 DIAGNOSIS — Z23 Encounter for immunization: Secondary | ICD-10-CM

## 2013-03-01 DIAGNOSIS — F0280 Dementia in other diseases classified elsewhere without behavioral disturbance: Secondary | ICD-10-CM

## 2013-03-01 MED ORDER — DONEPEZIL HCL 5 MG PO TABS: 5.0000 mg | ORAL_TABLET | Freq: Every evening | ORAL | Status: DC | PRN

## 2013-03-01 MED ORDER — POLYETHYLENE GLYCOL 3350 17 GM/SCOOP PO POWD: 17.0000 g | Freq: Every day | ORAL | Status: DC

## 2013-03-01 MED ORDER — QUETIAPINE FUMARATE 25 MG PO TABS: 25.0000 mg | ORAL_TABLET | Freq: Every day | ORAL | Status: DC

## 2013-03-01 MED ORDER — TRIAMTERENE-HCTZ 75-50 MG PO TABS: ORAL_TABLET | ORAL | Status: DC

## 2013-03-01 NOTE — Progress Notes (Signed)
Passed clock drawing 

## 2013-03-01 NOTE — Progress Notes (Signed)
Patient ID: Carol [name], female   DOB: Oct 21, 1935, 77 y.o.   MRN: [id number]  Chief Complaint  Patient presents with  . Follow-up   HPI 77 y/o female patient seen today for routine visit. Her 4 daughters are here with her today. She denies any complaints. She has hx of dementia but is able to carry out pleasant conversation. She continues to be forgetful and tends to repeat things. She also has been agitated at times when things does not happen the way she plans and family have noticed behavioral changes like getting angry easily, being mean to her daughters which is new fo few months on and off.  She is going days without bathing or changing her clothes and refuses to admit it. No falls reported She lives at home with her husband in one level housing Taking her medications  Review of Systems  Constitutional: Negative for fever, chills, malaise/fatigue and diaphoresis.  HENT: Negative for ear pain, chest congestion and sore throat.   Eyes: Negative for blurred vision and double vision.        Wears corrective lenses  Respiratory: Negative for cough and shortness of breath.   Cardiovascular: Negative for chest pain, palpitations, claudication and leg swelling.  Gastrointestinal: Negative for heartburn, nausea, vomiting, abdominal pain and constipation.  Genitourinary: Negative for dysuria and flank pain.  Musculoskeletal: Negative for joint pain and falls.  Skin: Negative for rash.  Neurological: Negative for dizziness, seizures, loss of consciousness and headaches.  Psychiatric/Behavioral: Positive for memory loss. Negative for depression. The patient is not nervous/anxious and does not have insomnia.    Past Medical History  Diagnosis Date  . Cataract   . Rickets, late effect   . Other and unspecified hyperlipidemia   . Anxiety state, unspecified   . Tobacco use disorder   . Depressive disorder, not elsewhere classified   . Unspecified essential hypertension   . Memory  loss   . Lumbago   . Unspecified constipation   . Varicose veins of lower extremities    Past Surgical History  Procedure Laterality Date  . Cyst excision Bilateral     behind ears   Medication reviewed  BP 140/92  Pulse 77  Temp(Src) 98.8 F (37.1 C) (Oral)  Resp 18  Wt 177 lb 6.4 oz (80.468 kg)  SpO2 97%  Physical Exam  Constitutional: She is oriented to person, place, and time. She appears well-developed and well-nourished. No distress.  HENT:   Head: Normocephalic and atraumatic.   Mouth/Throat: Oropharynx is clear and moist.  Eyes: Conjunctivae and EOM are normal. Pupils are equal, round, and reactive to light.  Neck: Normal range of motion. Neck supple. No JVD present.  Cardiovascular: Normal rate, regular rhythm, normal heart sounds and intact distal pulses.    No murmur heard. Pulmonary/Chest: Effort normal and breath sounds normal. No respiratory distress. She has no wheezes. She has no rales. She exhibits no tenderness.  Abdominal: Soft. Bowel sounds are normal. She exhibits no distension and no mass. There is no tenderness. There is no guarding.  Musculoskeletal: Normal range of motion. She exhibits no edema and no tenderness.  Lymphadenopathy:    She has no cervical adenopathy.  Neurological: She is alert and oriented to person, place, and time. No cranial nerve deficit.  Skin: Skin is warm and dry. She is not diaphoretic.  Psychiatric: She has a normal mood and affect. Her behavior is normal.   Labs-  CBC    Component Value Date/Time  WBC 8.2 01/25/2013 1541   WBC 8.2 09/22/2009 0607   RBC 4.48 01/25/2013 1541   RBC 3.46* 09/22/2009 0607   HGB 13.4 01/25/2013 1541   HCT 39.7 01/25/2013 1541   PLT 197 09/22/2009 0607   MCV 89 01/25/2013 1541   MCH 29.9 01/25/2013 1541   MCHC 33.8 01/25/2013 1541   MCHC 36.8* 09/22/2009 0607   RDW 13.9 01/25/2013 1541   RDW 15.0 09/22/2009 0607   LYMPHSABS 3.6* 01/25/2013 1541   LYMPHSABS 1.5 09/19/2009 0623   MONOABS 0.2  09/19/2009 0623   EOSABS 0.1 01/25/2013 1541   EOSABS 0.1 09/19/2009 0623   BASOSABS 0.0 01/25/2013 1541   BASOSABS 0.0 09/19/2009 0623    CMP     Component Value Date/Time   NA 143 01/25/2013 1541   NA 138 09/22/2009 0607   K 4.2 01/25/2013 1541   CL 104 01/25/2013 1541   CO2 24 01/25/2013 1541   GLUCOSE 86 01/25/2013 1541   GLUCOSE 103* 09/22/2009 0607   BUN 12 01/25/2013 1541   BUN 9 09/22/2009 0607   CREATININE 0.88 01/25/2013 1541   CALCIUM 9.6 01/25/2013 1541   PROT 7.0 01/25/2013 1541   PROT 6.0 09/20/2009 0653   ALBUMIN 2.2* 09/20/2009 0653   AST 14 01/25/2013 1541   ALT 7 01/25/2013 1541   ALKPHOS 67 01/25/2013 1541   BILITOT 0.2 01/25/2013 1541   GFRNONAA 64 01/25/2013 1541   GFRAA 74 01/25/2013 1541   Normal thyroid level and b12  Assessment/plan  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 Reversible causes ruled out.But with new behavior changes and mild cognitive impairment, will have her started on donepezil 5 mg daily at bedtime and seroquel 25 mg daily at bedtme. Common side effects with this medication explained. Will also schedule ct head w/o contrast to help rule out vascular infarcts// ischemia (i would expect some given her long standing HTN)  Psychosis- will have her on seroquel 25 mg daily and reassess  HTN- bp elevated this visit but patient mentions being bothered by all 4 daughters present in the room. bp normal at home as per daughter. continue maxzide for now, monitor bp  Vitamin d def- normal vitamin d. Continue calcium- vitamin d supplement   Constipation- regular bowel movement with current regimen

## 2013-03-10 ENCOUNTER — Ambulatory Visit
Admission: RE | Admit: 2013-03-10 | Discharge: 2013-03-10 | Disposition: A | Discharge status: Home / Self Care | Payer: Medicare HMO | Source: Ambulatory Visit | Attending: Internal Medicine | Admitting: Internal Medicine

## 2013-03-10 DIAGNOSIS — I10 Essential (primary) hypertension: Secondary | ICD-10-CM

## 2013-03-10 DIAGNOSIS — F0150 Vascular dementia without behavioral disturbance: Secondary | ICD-10-CM

## 2013-03-29 ENCOUNTER — Ambulatory Visit: Payer: Self-pay | Admitting: Internal Medicine

## 2013-04-13 ENCOUNTER — Ambulatory Visit: Payer: 59 | Admitting: Internal Medicine

## 2013-04-13 ENCOUNTER — Ambulatory Visit (INDEPENDENT_AMBULATORY_CARE_PROVIDER_SITE_OTHER): Payer: 59 | Admitting: Internal Medicine

## 2013-04-13 ENCOUNTER — Encounter: Payer: Self-pay | Admitting: Internal Medicine

## 2013-04-13 VITALS — BP 150/86 | HR 65 | Temp 98.3°F | Wt 177.4 lb

## 2013-04-13 DIAGNOSIS — F0150 Vascular dementia without behavioral disturbance: Secondary | ICD-10-CM

## 2013-04-13 DIAGNOSIS — F0280 Dementia in other diseases classified elsewhere without behavioral disturbance: Secondary | ICD-10-CM

## 2013-04-13 DIAGNOSIS — I10 Essential (primary) hypertension: Secondary | ICD-10-CM

## 2013-04-13 DIAGNOSIS — F29 Unspecified psychosis not due to a substance or known physiological condition: Secondary | ICD-10-CM

## 2013-04-13 MED ORDER — RIVASTIGMINE 9.5 MG/24HR TD PT24: 9.5000 mg | MEDICATED_PATCH | Freq: Every day | TRANSDERMAL | Status: DC

## 2013-04-13 NOTE — Progress Notes (Signed)
Patient ID: Carol [name], female   DOB: [birth year], 77 y.o.   MRN: [id number]  Chief Complaint  Patient presents with  . Medical Managment of Chronic Issues    6 week f/u   No Known Allergies  HPI 77 y/o patient with recently diagnosed dementia is here for follow up. She was started on donepezil and seroquel 6 weeks back and is here for follow up. She has not taken the medication as per her 4 daughters who are there with her in the room. She mentions she is concerned about the side effect and does not want to take it. She is pleasant at making conversations and her long term memory is intact. She is refusing to take shower and maintain her hygiene. No acute behavioral changes reported but has occassional outbursts  Review of Systems   Constitutional: Negative for fever, chills, malaise/fatigue and diaphoresis.   HENT: Negative for ear pain, chest congestion and sore throat.    Eyes: Negative for blurred vision and double vision.        Wears corrective lenses   Respiratory: Negative for cough and shortness of breath.    Cardiovascular: Negative for chest pain, palpitations, claudication and leg swelling.   Gastrointestinal: Negative for heartburn, nausea, vomiting, abdominal pain and constipation.   Genitourinary: Negative for dysuria and flank pain.   Musculoskeletal: Negative for joint pain and falls.   Skin: Negative for rash.   Neurological: Negative for dizziness, seizures, loss of consciousness and headaches.   Psychiatric/Behavioral: Positive for memory loss. Negative for depression. The patient is not nervous/anxious and does not have insomnia.       Past Medical History  Diagnosis Date  . Cataract   . Rickets, late effect   . Other and unspecified hyperlipidemia   . Anxiety state, unspecified   . Tobacco use disorder   . Depressive disorder, not elsewhere classified   . Unspecified essential hypertension   . Memory loss   . Lumbago   . Unspecified constipation    . Varicose veins of lower extremities    Medication reviewed. See [facility]  Past Surgical History  Procedure Laterality Date  . Cyst excision Bilateral     behind ears    Physical Exam   BP 150/86  Pulse 65  Temp(Src) 98.3 F (36.8 C) (Oral)  Wt 177 lb 6.4 oz (80.468 kg)  SpO2 98%  Constitutional: She is oriented to person, place, and time. She appears well-developed and well-nourished. No distress.   HENT:   Head: Normocephalic and atraumatic.   Mouth/Throat: Oropharynx is clear and moist.   Eyes: Conjunctivae and EOM are normal. Pupils are equal, round, and reactive to light.   Neck: Normal range of motion. Neck supple. No JVD present.   Cardiovascular: Normal rate, regular rhythm, normal heart sounds and intact distal pulses.    No murmur heard. Pulmonary/Chest: Effort normal and breath sounds normal. No respiratory distress. She has no wheezes. She has no rales. She exhibits no tenderness.   Abdominal: Soft. Bowel sounds are normal. She exhibits no distension and no mass. There is no tenderness. There is no guarding.  Musculoskeletal: Normal range of motion. She exhibits no edema and no tenderness.  Lymphadenopathy:    She has no cervical adenopathy.  Neurological: She is alert and oriented to person, place, and time. No cranial nerve deficit.  short term memory loss Skin: Skin is warm and dry. She is not diaphoretic.  Psychiatric: She has a normal mood and affect. Her  behavior is normal in office  Labs- CBC    Component Value Date/Time   WBC 8.2 01/25/2013 1541   WBC 8.2 09/22/2009 0607   RBC 4.48 01/25/2013 1541   RBC 3.46* 09/22/2009 0607   HGB 13.4 01/25/2013 1541   HCT 39.7 01/25/2013 1541   PLT 197 09/22/2009 0607   MCV 89 01/25/2013 1541   MCH 29.9 01/25/2013 1541   MCHC 33.8 01/25/2013 1541   MCHC 36.8* 09/22/2009 0607   RDW 13.9 01/25/2013 1541   RDW 15.0 09/22/2009 0607   LYMPHSABS 3.6* 01/25/2013 1541   LYMPHSABS 1.5 09/19/2009 0623   MONOABS 0.2 09/19/2009 0623    EOSABS 0.1 01/25/2013 1541   EOSABS 0.1 09/19/2009 0623   BASOSABS 0.0 01/25/2013 1541   BASOSABS 0.0 09/19/2009 0623    CMP     Component Value Date/Time   NA 143 01/25/2013 1541   NA 138 09/22/2009 0607   K 4.2 01/25/2013 1541   CL 104 01/25/2013 1541   CO2 24 01/25/2013 1541   GLUCOSE 86 01/25/2013 1541   GLUCOSE 103* 09/22/2009 0607   BUN 12 01/25/2013 1541   BUN 9 09/22/2009 0607   CREATININE 0.88 01/25/2013 1541   CALCIUM 9.6 01/25/2013 1541   PROT 7.0 01/25/2013 1541   PROT 6.0 09/20/2009 0653   ALBUMIN 2.2* 09/20/2009 0653   AST 14 01/25/2013 1541   ALT 7 01/25/2013 1541   ALKPHOS 67 01/25/2013 1541   BILITOT 0.2 01/25/2013 1541   GFRNONAA 64 01/25/2013 1541   GFRAA 74 01/25/2013 1541   Normal tsh and b12  Ct head- 03/10/13 FINDINGS: Mild atrophy and chronic microvascular ischemic change. Moderate supra vermian atrophy. Cavum septum pellucidum. No acute stroke or hemorrhage. Calvarium intact. Tiny osteoma left frontal bone projects inward. No extra-axial fluid collections. Normal sized ventricles for age. Scalp and extracranial soft tissues unremarkable. No sinus or mastoid disease.   IMPRESSION: Chronic changes as described.  Similar appearance to prior MR.   assessment/plan  Mixed dementia- MMSE 25/30 and behavior changes. Vascular and alzhimer's. Will d/c donepeil and have her on exelon patch 4.6 mg daily for a week and then 9.5 mg daily, sample of 7 days provided and a free trial care of 30 days provided. Reversible causes ruled out.pt agrees to take seroquel 25 mg daily at bedtime. Common side effects with this medication explained. Explained to daughter to give all her meds together in evening when she goes to see her  Psychosis- will have her on seroquel 25 mg daily and reassess  HTN- continue maxzide for now, monitor bp

## 2013-05-04 ENCOUNTER — Ambulatory Visit: Payer: 59 | Admitting: Internal Medicine

## 2013-05-10 ENCOUNTER — Ambulatory Visit (INDEPENDENT_AMBULATORY_CARE_PROVIDER_SITE_OTHER): Payer: 59 | Admitting: Internal Medicine

## 2013-05-10 ENCOUNTER — Encounter: Payer: Self-pay | Admitting: Internal Medicine

## 2013-05-10 VITALS — BP 124/86 | HR 81 | Temp 99.2°F | Wt 183.0 lb

## 2013-05-10 DIAGNOSIS — F0150 Vascular dementia without behavioral disturbance: Secondary | ICD-10-CM

## 2013-05-10 DIAGNOSIS — F0280 Dementia in other diseases classified elsewhere without behavioral disturbance: Secondary | ICD-10-CM

## 2013-05-10 DIAGNOSIS — I10 Essential (primary) hypertension: Secondary | ICD-10-CM

## 2013-05-10 DIAGNOSIS — F29 Unspecified psychosis not due to a substance or known physiological condition: Secondary | ICD-10-CM

## 2013-05-10 MED ORDER — DESLORATADINE 5 MG PO TABS: 5.0000 mg | ORAL_TABLET | Freq: Every day | ORAL | Status: DC

## 2013-05-10 MED ORDER — RIVASTIGMINE 13.3 MG/24HR TD PT24: 1.0000 | MEDICATED_PATCH | Freq: Every day | TRANSDERMAL | Status: DC

## 2013-05-10 MED ORDER — TRIAMTERENE-HCTZ 75-50 MG PO TABS: ORAL_TABLET | ORAL | Status: DC

## 2013-05-10 NOTE — Progress Notes (Signed)
Patient ID: Carol [name], female   DOB: [birth year], 77 y.o.   MRN: [id number]  Chief Complaint  Patient presents with  . Medication Management    memory    No Known Allergies  HPI 77 y/o patient is here with her daughter for follow up on her exelon patch. She was started on it few weeks back and appears to have tolerated it well. No agitation. Complaint with her seroquel. She is pleasant at making conversations and her long term memory is intact. She is refusing to take shower on daily basis but able to maintain basic hygiene.   Review of Systems   Constitutional: Negative for fever, chills, malaise/fatigue and diaphoresis.   HENT: Negative for ear pain, chest congestion and sore throat.    Eyes: Negative for blurred vision and double vision.        Wears corrective lenses   Respiratory: Negative for cough and shortness of breath.    Cardiovascular: Negative for chest pain, palpitations, claudication and leg swelling.   Gastrointestinal: Negative for heartburn, nausea, vomiting, abdominal pain and constipation.   Genitourinary: Negative for dysuria and flank pain.   Musculoskeletal: Negative for joint pain and falls.   Skin: Negative for rash.   Neurological: Negative for dizziness, seizures, loss of consciousness and headaches.   Psychiatric/Behavioral: Positive for memory loss. Negative for depression. The patient is not nervous/anxious and does not have insomnia.      Physical exam BP 124/86  Pulse 81  Temp(Src) 99.2 F (37.3 C) (Oral)  Wt 183 lb (83.008 kg)  SpO2 96%  Constitutional: She is oriented to person, place, and time. She appears well-developed and well-nourished. No distress.   HENT:   Head: Normocephalic and atraumatic.   Mouth/Throat: Oropharynx is clear and moist.   Eyes: Conjunctivae and EOM are normal. Pupils are equal, round, and reactive to light.   Cardiovascular: Normal rate, regular rhythm, normal heart sounds and intact distal pulses.    No  murmur heard. Pulmonary/Chest: Effort normal and breath sounds normal. No respiratory distress.  Abdominal: Soft. Bowel sounds are normal. She exhibits no distension and no mass. There is no tenderness. There is no guarding.  Musculoskeletal: Normal range of motion. She exhibits no edema and no tenderness.  Lymphadenopathy:    She has no cervical adenopathy.  Neurological: She is alert and oriented to person, place, and time. No cranial nerve deficit.  Skin: Skin is warm and dry. She is not diaphoretic.  Psychiatric: She has a normal mood and affect. Her behavior is normal in office  Assessment/plan  Mixed dementia- MMSE 25/30. Tolerating exelon patch well. Will increase this to 13.3 mg daily, common gi side effects explained. Monitor clinically. Pt has been calmer and no recent agitation. Pleasant this visit.    Psychosis- improved, continue seroquel 25 mg daily   HTN- continue maxzide for now, monitor bp

## 2013-05-11 ENCOUNTER — Other Ambulatory Visit: Payer: Self-pay | Admitting: Internal Medicine

## 2013-07-24 ENCOUNTER — Other Ambulatory Visit: Payer: Self-pay | Admitting: Internal Medicine

## 2013-08-25 ENCOUNTER — Other Ambulatory Visit: Payer: Self-pay | Admitting: Internal Medicine

## 2013-09-14 ENCOUNTER — Encounter: Payer: Self-pay | Admitting: Internal Medicine

## 2013-09-14 ENCOUNTER — Ambulatory Visit (INDEPENDENT_AMBULATORY_CARE_PROVIDER_SITE_OTHER): Payer: 59 | Admitting: Internal Medicine

## 2013-09-14 VITALS — BP 148/84 | HR 72 | Resp 10 | Ht 66.03 in | Wt 166.0 lb

## 2013-09-14 DIAGNOSIS — F0393 Unspecified dementia, unspecified severity, with mood disturbance: Secondary | ICD-10-CM

## 2013-09-14 DIAGNOSIS — E785 Hyperlipidemia, unspecified: Secondary | ICD-10-CM

## 2013-09-14 DIAGNOSIS — G309 Alzheimer's disease, unspecified: Principal | ICD-10-CM

## 2013-09-14 DIAGNOSIS — F3289 Other specified depressive episodes: Secondary | ICD-10-CM

## 2013-09-14 DIAGNOSIS — I10 Essential (primary) hypertension: Secondary | ICD-10-CM

## 2013-09-14 DIAGNOSIS — K5900 Constipation, unspecified: Secondary | ICD-10-CM

## 2013-09-14 DIAGNOSIS — F0150 Vascular dementia without behavioral disturbance: Secondary | ICD-10-CM

## 2013-09-14 DIAGNOSIS — F329 Major depressive disorder, single episode, unspecified: Secondary | ICD-10-CM

## 2013-09-14 DIAGNOSIS — F0280 Dementia in other diseases classified elsewhere without behavioral disturbance: Secondary | ICD-10-CM

## 2013-09-14 MED ORDER — RIVASTIGMINE 13.3 MG/24HR TD PT24: 1.0000 | MEDICATED_PATCH | Freq: Every day | TRANSDERMAL | Status: DC

## 2013-09-14 NOTE — Progress Notes (Signed)
Patient ID: Carol [name], female   DOB: [birth year], 78 y.o.   MRN: [id number]     Chief Complaint  Patient presents with  . Medical Managment of Chronic Issues    routine follow up   No Known Allergies  HPI 78 y/o patient is here with her daughters for follow up. Her exelon patch dose was adjusted last visit but the family continued to receive 9.9 mg patch and has been giving her the same dose. Her mood has been somewhat stable with fewer agitation episodes. Her bowel movement has been regular. He refuses to take bath but other wise has been taking her medications. No other concerns from family. She is due for an annual physical.   Review of Systems   Constitutional: Negative for fever, chills, malaise/fatigue and diaphoresis.   HENT: Negative for ear pain, chest congestion and sore throat.    Eyes: Negative for blurred vision and double vision. Wears corrective lenses   Respiratory: Negative for cough and shortness of breath.    Cardiovascular: Negative for chest pain, palpitations, claudication and leg swelling.   Gastrointestinal: Negative for heartburn, nausea, vomiting, abdominal pain and constipation.   Genitourinary: Negative for dysuria and flank pain.   Musculoskeletal: Negative for joint pain and falls.   Skin: Negative for rash.   Neurological: Negative for dizziness, seizures, loss of consciousness and headaches.   Psychiatric/Behavioral: Positive for memory loss.Negative for depression. The patient is not nervous/anxious and does not have insomnia.     Past Medical History  Diagnosis Date  . Cataract   . Rickets, late effect   . Other and unspecified hyperlipidemia   . Anxiety state, unspecified   . Tobacco use disorder   . Depressive disorder, not elsewhere classified   . Unspecified essential hypertension   . Memory loss   . Lumbago   . Unspecified constipation   . Varicose veins of lower extremities    Past Surgical History  Procedure Laterality Date    . Cyst excision Bilateral     behind ears   Current Outpatient Prescriptions on File Prior to Visit  Medication Sig Dispense Refill  . Calcium Carbonate-Vitamin D (CALTRATE 600+D) 600-400 MG-UNIT per tablet Take 1 tablet by mouth 2 (two) times a week.      . desloratadine (CLARINEX) 5 MG tablet Take 1 tablet (5 mg total) by mouth daily. Take 1 tablet daily as needed for allergies.  30 tablet  5  . docusate sodium (COLACE) 100 MG capsule Take 100 mg by mouth 2 (two) times daily. Take 1 tablet by mouth twice daily for constipation.      . ergocalciferol (VITAMIN D2) 50000 UNITS capsule Take 50,000 Units by mouth once a week.      . Hydrocortisone Acetate ([employer] HC-1% HEMORRHOID) 1 % OINT Apply topically 4 (four) times daily. Apply 4 times daily for pain.      Marland Kitchen MAXZIDE 75-50 MG per tablet TAKE 1 TABLET BY MOUTH ONE TIME DAILY  30 tablet  2  . Multiple Vitamins-Minerals (MULTIVITAMIN WITH MINERALS) tablet Take 1 tablet by mouth daily.      . polyethylene glycol powder (GLYCOLAX/MIRALAX) powder Take 17 g by mouth daily.  3350 g  1  . QUEtiapine (SEROQUEL) 25 MG tablet TAKE 1 TABLET BY MOUTH AT BEDTIME  30 tablet  5   No current facility-administered medications on file prior to visit.    Physical exam BP 148/84  Pulse 72  Resp 10  Ht 5' 6.02" (1.677  m)  Wt 166 lb (75.297 kg)  BMI 26.77 kg/m2  Constitutional: She is oriented to person, place, and time. She appears well-developed and well-nourished. No distress.   HENT:   Head: Normocephalic and atraumatic.   Mouth/Throat: Oropharynx is clear and moist.   Eyes: Conjunctivae and EOM are normal. Pupils are equal, round, and reactive to light.   Cardiovascular: Normal rate, regular rhythm, normal heart sounds and intact distal pulses.    No murmur heard. Pulmonary/Chest: Effort normal and breath sounds normal. No respiratory distress.   Abdominal: Soft. Bowel sounds are normal. She exhibits no distension and no mass. There is no tenderness.  There is no guarding.  Musculoskeletal: Normal range of motion. She exhibits no edema and no tenderness.  Lymphadenopathy:    She has no cervical adenopathy.  Neurological: She is alert and oriented to person, place, and time. No cranial nerve deficit.   Skin: Skin is warm and dry. She is not diaphoretic.  Psychiatric: She has a normal mood and affect. Her behavior is normal in office  Assessment/plan  1. Essential hypertension, benign continue maxzide for now, monitor bp - CBC with Differential - CMP  2. Constipation Stable. Continue miralax and prn hydrocortisone cream for her hemorrhoids  3. Mixed Alzheimer's and vascular dementia Will increase her exelon patch to 13.3 mg patch daily for now. Continue seroquel to help with her behavior  4. Other and unspecified hyperlipidemia Off all medication at present - Lipid Panel  5. Depression due to dementia Stable. Continue seroquel 25 mg daily

## 2013-09-15 LAB — CBC WITH DIFFERENTIAL/PLATELET
Basophils Absolute: 0 10*3/uL (ref 0.0–0.2)
Basos: 0 %
Eos: 2 %
Eosinophils Absolute: 0.2 10*3/uL (ref 0.0–0.4)
HEMATOCRIT: 40.4 % (ref 34.0–46.6)
Hemoglobin: 13.8 g/dL (ref 11.1–15.9)
Immature Grans (Abs): 0 10*3/uL (ref 0.0–0.1)
Immature Granulocytes: 0 %
LYMPHS ABS: 3.7 10*3/uL — AB (ref 0.7–3.1)
Lymphs: 46 %
MCH: 29.8 pg (ref 26.6–33.0)
MCHC: 34.2 g/dL (ref 31.5–35.7)
MCV: 87 fL (ref 79–97)
Monocytes Absolute: 0.4 10*3/uL (ref 0.1–0.9)
Monocytes: 5 %
Neutrophils Absolute: 3.7 10*3/uL (ref 1.4–7.0)
Neutrophils Relative %: 47 %
RBC: 4.63 x10E6/uL (ref 3.77–5.28)
RDW: 14.1 % (ref 12.3–15.4)
WBC: 8 10*3/uL (ref 3.4–10.8)

## 2013-09-15 LAB — COMPREHENSIVE METABOLIC PANEL
A/G RATIO: 1.3 (ref 1.1–2.5)
ALBUMIN: 4.1 g/dL (ref 3.5–4.8)
ALT: 5 IU/L (ref 0–32)
AST: 14 IU/L (ref 0–40)
Alkaline Phosphatase: 66 IU/L (ref 39–117)
BILIRUBIN TOTAL: 0.5 mg/dL (ref 0.0–1.2)
BUN/Creatinine Ratio: 9 — ABNORMAL LOW (ref 11–26)
BUN: 9 mg/dL (ref 8–27)
CALCIUM: 9.5 mg/dL (ref 8.7–10.3)
CHLORIDE: 104 mmol/L (ref 97–108)
CO2: 23 mmol/L (ref 18–29)
Creatinine, Ser: 0.98 mg/dL (ref 0.57–1.00)
GFR calc Af Amer: 65 mL/min/{1.73_m2} (ref >59)
GFR calc non Af Amer: 57 mL/min/{1.73_m2} — ABNORMAL LOW (ref >59)
Globulin, Total: 3.2 g/dL (ref 1.5–4.5)
Glucose: 90 mg/dL (ref 65–99)
Potassium: 3.9 mmol/L (ref 3.5–5.2)
Sodium: 145 mmol/L — ABNORMAL HIGH (ref 134–144)
TOTAL PROTEIN: 7.3 g/dL (ref 6.0–8.5)

## 2013-09-15 LAB — LIPID PANEL
Chol/HDL Ratio: 3.6 ratio units (ref 0.0–4.4)
Cholesterol, Total: 227 mg/dL — ABNORMAL HIGH (ref 100–199)
HDL: 63 mg/dL (ref >39)
LDL Calculated: 147 mg/dL — ABNORMAL HIGH (ref 0–99)
Triglycerides: 87 mg/dL (ref 0–149)
VLDL CHOLESTEROL CAL: 17 mg/dL (ref 5–40)

## 2013-12-14 ENCOUNTER — Ambulatory Visit (INDEPENDENT_AMBULATORY_CARE_PROVIDER_SITE_OTHER): Payer: 59 | Admitting: Internal Medicine

## 2013-12-14 ENCOUNTER — Encounter: Payer: Self-pay | Admitting: Internal Medicine

## 2013-12-14 VITALS — BP 130/84 | HR 72 | Temp 98.4°F | Wt 161.6 lb

## 2013-12-14 DIAGNOSIS — F0280 Dementia in other diseases classified elsewhere without behavioral disturbance: Secondary | ICD-10-CM

## 2013-12-14 DIAGNOSIS — M8580 Other specified disorders of bone density and structure, unspecified site: Secondary | ICD-10-CM | POA: Insufficient documentation

## 2013-12-14 DIAGNOSIS — F17200 Nicotine dependence, unspecified, uncomplicated: Secondary | ICD-10-CM

## 2013-12-14 DIAGNOSIS — G309 Alzheimer's disease, unspecified: Secondary | ICD-10-CM

## 2013-12-14 DIAGNOSIS — I10 Essential (primary) hypertension: Secondary | ICD-10-CM

## 2013-12-14 DIAGNOSIS — H6123 Impacted cerumen, bilateral: Secondary | ICD-10-CM

## 2013-12-14 DIAGNOSIS — R062 Wheezing: Secondary | ICD-10-CM

## 2013-12-14 DIAGNOSIS — Z23 Encounter for immunization: Secondary | ICD-10-CM

## 2013-12-14 DIAGNOSIS — F329 Major depressive disorder, single episode, unspecified: Secondary | ICD-10-CM

## 2013-12-14 DIAGNOSIS — H6120 Impacted cerumen, unspecified ear: Secondary | ICD-10-CM

## 2013-12-14 DIAGNOSIS — E785 Hyperlipidemia, unspecified: Secondary | ICD-10-CM

## 2013-12-14 DIAGNOSIS — F0393 Unspecified dementia, unspecified severity, with mood disturbance: Secondary | ICD-10-CM

## 2013-12-14 DIAGNOSIS — F3289 Other specified depressive episodes: Secondary | ICD-10-CM

## 2013-12-14 DIAGNOSIS — K5900 Constipation, unspecified: Secondary | ICD-10-CM

## 2013-12-14 DIAGNOSIS — M949 Disorder of cartilage, unspecified: Secondary | ICD-10-CM

## 2013-12-14 DIAGNOSIS — Z0000 Encounter for general adult medical examination without abnormal findings: Secondary | ICD-10-CM

## 2013-12-14 DIAGNOSIS — F0150 Vascular dementia without behavioral disturbance: Secondary | ICD-10-CM

## 2013-12-14 DIAGNOSIS — M899 Disorder of bone, unspecified: Secondary | ICD-10-CM

## 2013-12-14 MED ORDER — QUETIAPINE FUMARATE 25 MG PO TABS: ORAL_TABLET | ORAL | Status: DC

## 2013-12-14 MED ORDER — ALBUTEROL SULFATE (2.5 MG/3ML) 0.083% IN NEBU: 2.5000 mg | INHALATION_SOLUTION | Freq: Four times a day (QID) | RESPIRATORY_TRACT | Status: DC | PRN

## 2013-12-14 MED ORDER — TETANUS-DIPHTH-ACELL PERTUSSIS 5-2.5-18.5 LF-MCG/0.5 IM SUSP: 0.5000 mL | Freq: Once | INTRAMUSCULAR | Status: DC

## 2013-12-14 MED ORDER — MAXZIDE 75-50 MG PO TABS: ORAL_TABLET | ORAL | Status: DC

## 2013-12-14 MED ORDER — CARBAMIDE PEROXIDE 6.5 % OT SOLN: 5.0000 [drp] | Freq: Two times a day (BID) | OTIC | Status: DC

## 2013-12-14 MED ORDER — ATORVASTATIN CALCIUM 10 MG PO TABS: 10.0000 mg | ORAL_TABLET | Freq: Every day | ORAL | Status: DC

## 2013-12-14 MED ORDER — ZOSTER VACCINE LIVE 19400 UNT/0.65ML ~~LOC~~ SOLR: 0.6500 mL | Freq: Once | SUBCUTANEOUS | Status: DC

## 2013-12-14 NOTE — Progress Notes (Signed)
Patient ID: Carol [name], female   DOB: 1935/12/10, 78 y.o.   MRN: [id number]    Chief Complaint  Patient presents with  . Annual Exam    Physical with no labs done prior- need EKG & MMSE   Allergies  Allergen Reactions  . Penicillins     Rash & swelling   HPI 78 y/o patient is here with her daughters for annual exam. She has history of dementia and behavior overall has been calm and stable. Continue to refuse to take bath but other wise has been taking her medications. No other concerns from family.  Mammogram 05/10/12 normal 12/23/11 dexa scan- low bone mass Continues to smoke  Wt Readings from Last 3 Encounters:  12/14/13 161 lb 9.6 oz (73.301 kg)  09/14/13 166 lb (75.297 kg)  05/10/13 183 lb (83.008 kg)   Review of Systems   Constitutional: Negative for fever, chills, malaise/fatigue and diaphoresis.   HENT: Negative for ear pain, chest congestion and sore throat.    Eyes: Negative for blurred vision and double vision. Wears corrective lenses. Due for eye exam- family to make appointment Respiratory: Negative for cough, wheezing and shortness of breath. smoking at present Cardiovascular: Negative for chest pain, palpitations, claudication and leg swelling. Has varicose veins  Gastrointestinal: Negative for heartburn, nausea, vomiting, abdominal pain, melena and constipation.   Genitourinary: Negative for dysuria and flank pain. Denies nocturia Musculoskeletal: Negative for joint pain and falls.   Skin: Negative for itching or rash.   Neurological: Negative for dizziness, seizures, loss of consciousness and headaches.   Psychiatric/Behavioral: Positive for memory loss.Negative for depression. The patient is not nervous/anxious and does not have insomnia.       Past Medical History  Diagnosis Date  . Cataract   . Rickets, late effect   . Other and unspecified hyperlipidemia   . Anxiety state, unspecified   . Tobacco use disorder   . Depressive disorder, not  elsewhere classified   . Unspecified essential hypertension   . Memory loss   . Lumbago   . Unspecified constipation   . Varicose veins of lower extremities    Past Surgical History  Procedure Laterality Date  . Cyst excision Bilateral     behind ears   Family History  Problem Relation Age of Onset  . Heart disease Father    History   Social History  . Marital Status: Married    Spouse Name: N/A    Number of Children: N/A  . Years of Education: N/A   Occupational History  . Not on file.   Social History Main Topics  . Smoking status: Current Every Day Smoker  . Smokeless tobacco: Not on file     Comment: No more than three cig daily   . Alcohol Use: No  . Drug Use: No  . Sexual Activity: Not on file   Other Topics Concern  . Not on file   Social History Narrative  . No narrative on file   Physical exam BP 130/84  Pulse 72  Temp(Src) 98.4 F (36.9 C) (Oral)  Wt 161 lb 9.6 oz (73.301 kg)  SpO2 98%  General- elderly female in no acute distress Head- atraumatic, normocephalic Eyes- PERRLA, EOMI, no pallor, no icterus, no discharge Ears- impacted cerumen in both ears Neck- no lymphadenopathy, no thyromegaly, no jugular vein distension, no carotid bruit Nose- normal nasaal mucosa, no maxillary sinus tenderness, no frontal sinus tenderness Mouth- normal mucus membrane, no oral thrush, poor dentition, has partial  dentures, has a mass in palate region Chest- no chest wall deformities, no chest wall tenderness Breast- no masses, no palpable lumps, normal nipple and areola exam, no axillary lymphadenopathy Cardiovascular- normal s1,s2, no murmurs/ rubs/ gallops, normal distal pulses Respiratory-has expiratory wheeze, no rhonchi, no crackles Abdomen- bowel sounds present, soft, non tender, no organomegaly, no guarding or rigidity, no CVA tenderness Musculoskeletal- able to move all 4 extremities, no spinal and paraspinal tenderness, steady gait, no use of assistive  device, normal range of motion, no leg edema Neurological- no focal deficit, normal reflexes, normal muscle strength, normal sensation to fine touch and vibration Skin- warm and dry Psychiatry- alert and oriented to person, place and time, normal mood and affect   12/14/13 MMSE 25/30, passed clock draw 12/14/13 ekg- NSR, left atrial abnormality and leftward axis  Labs- Lab Results  Component Value Date   WBC 8.0 09/14/2013   HGB 13.8 09/14/2013   HCT 40.4 09/14/2013   MCV 87 09/14/2013   PLT 197 09/22/2009   CMP     Component Value Date/Time   NA 145* 09/14/2013 1632   NA 138 09/22/2009 0607   K 3.9 09/14/2013 1632   CL 104 09/14/2013 1632   CO2 23 09/14/2013 1632   GLUCOSE 90 09/14/2013 1632   GLUCOSE 103* 09/22/2009 0607   BUN 9 09/14/2013 1632   BUN 9 09/22/2009 0607   CREATININE 0.98 09/14/2013 1632   CALCIUM 9.5 09/14/2013 1632   PROT 7.3 09/14/2013 1632   PROT 6.0 09/20/2009 0653   ALBUMIN 2.2* 09/20/2009 0653   AST 14 09/14/2013 1632   ALT 5 09/14/2013 1632   ALKPHOS 66 09/14/2013 1632   BILITOT 0.5 09/14/2013 1632   GFRNONAA 57* 09/14/2013 1632   GFRAA 65 09/14/2013 1632    Assessment/plan  1. Essential hypertension, benign maxzide to be continued, refill provided - CBC with Differential; Future - CMP; Future  2. Constipation, unspecified constipation type miralax has been helpful  3. Mixed Alzheimer's and vascular dementia Stable with exelon patch at present - TSH; Future  4. Depression due to dementia Stable on seroquel  5. Osteopenia Repeat dexa. Continue capvit d supplement and fall precautions - DG Bone Density; Future  6. Depressive disorder, not elsewhere classified Stable with seroquel  7. Hyperlipidemia Will have her started on lipitor 10 mg daily - Lipid Panel; Future - CMP; Future  8. Tobacco use disorder - DG Chest 2 View; Future  9. Routine general medical examination at a health care facility Family to make Eye appointment and dental appointment Shingles and tdap  script given FOBT card provided Labs prior to next visit. Recent labs reviewed dexa scheduled Normal breast exam. Will not get further mammogram and pap smear given her age and absence of complaints counselling on diet, self hygiene, regular interval appointments provided Also spoke about living will and HCPOA  10. Need for prophylactic vaccination with combined diphtheria-tetanus-pertussis (DTP) vaccine - Tdap (BOOSTRIX) 5-2.5-18.5 LF-MCG/0.5 injection; Inject 0.5 mLs into the muscle once.  Dispense: 0.5 mL; Refill: 0  11. Need for prophylactic vaccination and inoculation against other combinations of diseases - zoster vaccine live, PF, (ZOSTAVAX) [id number] UNT/0.65ML injection; Inject 19,400 Units into the skin once.  Dispense: 1 each; Refill: 0  12. Impacted cerumen, bilateral Debrox bid for a week and then ear lavage  13. Wheezing Likely from emphysematous changes from her smoking history. Pt not willing to quit smoking. Will have her on albuterol MDI prn for now with her not having any  cough or dyspnea reported. Will get cxr to help assess for mass vs chronic bronchitis/ emphysema changes. Monitor clinically - DG Chest 2 View; Future

## 2013-12-14 NOTE — Progress Notes (Signed)
Passed clock drawing 

## 2013-12-21 ENCOUNTER — Encounter: Payer: Self-pay | Admitting: Internal Medicine

## 2013-12-28 ENCOUNTER — Other Ambulatory Visit: Payer: Self-pay | Admitting: Internal Medicine

## 2013-12-28 DIAGNOSIS — Z1231 Encounter for screening mammogram for malignant neoplasm of breast: Secondary | ICD-10-CM

## 2014-01-11 ENCOUNTER — Ambulatory Visit
Admission: RE | Admit: 2014-01-11 | Discharge: 2014-01-11 | Disposition: A | Discharge status: Home / Self Care | Payer: Medicare PPO | Source: Ambulatory Visit | Attending: Internal Medicine | Admitting: Internal Medicine

## 2014-01-11 DIAGNOSIS — Z1231 Encounter for screening mammogram for malignant neoplasm of breast: Secondary | ICD-10-CM

## 2014-01-11 DIAGNOSIS — M8580 Other specified disorders of bone density and structure, unspecified site: Secondary | ICD-10-CM

## 2014-01-11 LAB — HM DEXA SCAN

## 2014-01-13 ENCOUNTER — Encounter: Payer: Self-pay | Admitting: *Deleted

## 2014-01-17 ENCOUNTER — Telehealth: Payer: Self-pay | Admitting: *Deleted

## 2014-01-17 NOTE — Telephone Encounter (Signed)
Pharmacy called and stated that patient's insurance will not pay for brand Maxide but will Generic. Wants to know if it is ok to change to Generic? Please Advise.

## 2014-01-17 NOTE — Telephone Encounter (Signed)
Pharmacy Notified

## 2014-01-17 NOTE — Telephone Encounter (Signed)
ok 

## 2014-01-24 ENCOUNTER — Encounter: Payer: Self-pay | Admitting: Internal Medicine

## 2014-01-24 ENCOUNTER — Ambulatory Visit (INDEPENDENT_AMBULATORY_CARE_PROVIDER_SITE_OTHER): Payer: 59 | Admitting: Internal Medicine

## 2014-01-24 ENCOUNTER — Encounter: Payer: Self-pay | Admitting: *Deleted

## 2014-01-24 VITALS — BP 142/80 | HR 68 | Temp 98.1°F | Wt 162.6 lb

## 2014-01-24 DIAGNOSIS — H269 Unspecified cataract: Secondary | ICD-10-CM

## 2014-01-24 DIAGNOSIS — M8580 Other specified disorders of bone density and structure, unspecified site: Secondary | ICD-10-CM

## 2014-01-24 DIAGNOSIS — M899 Disorder of bone, unspecified: Secondary | ICD-10-CM

## 2014-01-24 DIAGNOSIS — E785 Hyperlipidemia, unspecified: Secondary | ICD-10-CM

## 2014-01-24 DIAGNOSIS — M949 Disorder of cartilage, unspecified: Secondary | ICD-10-CM

## 2014-01-24 MED ORDER — ALENDRONATE SODIUM 70 MG PO TABS: 70.0000 mg | ORAL_TABLET | ORAL | Status: DC

## 2014-01-24 MED ORDER — ALBUTEROL SULFATE HFA 108 (90 BASE) MCG/ACT IN AERS: 1.0000 | INHALATION_SPRAY | Freq: Four times a day (QID) | RESPIRATORY_TRACT | Status: DC | PRN

## 2014-01-24 NOTE — Progress Notes (Signed)
Patient ID: Carol [name], female   DOB: 1935-06-28, 78 y.o.   MRN: [id number]    Chief Complaint  Patient presents with  . Follow-up    discuss Bone Density Results  . other    referral to [facility].   Allergies  Allergen Reactions  . Penicillins     Rash & swelling   HPI 78 y/o female pt here to review her bone scan result. She also needs to get a referral to eye doctor for her cataract follow up. No recent falls reported. Denies bone pain. ocassional joint pain  ROS No chest pain or dyspnea No nausea or vomiting No abdominal pain No headaches  Past Medical History  Diagnosis Date  . Cataract   . Rickets, late effect   . Other and unspecified hyperlipidemia   . Anxiety state, unspecified   . Tobacco use disorder   . Depressive disorder, not elsewhere classified   . Unspecified essential hypertension   . Memory loss   . Lumbago   . Unspecified constipation   . Varicose veins of lower extremities    Current Outpatient Prescriptions on File Prior to Visit  Medication Sig Dispense Refill  . atorvastatin (LIPITOR) 10 MG tablet Take 1 tablet (10 mg total) by mouth daily.  90 tablet  3  . Calcium Carbonate-Vitamin D (CALTRATE 600+D) 600-400 MG-UNIT per tablet Take 1 tablet by mouth 2 (two) times a week.      . carbamide peroxide (DEBROX) 6.5 % otic solution Place 5 drops into both ears 2 (two) times daily. For a week and then come to office for ear lavage  15 mL  0  . desloratadine (CLARINEX) 5 MG tablet Take 1 tablet (5 mg total) by mouth daily. Take 1 tablet daily as needed for allergies.  30 tablet  5  . Hydrocortisone Acetate ([employer] HC-1% HEMORRHOID) 1 % OINT Apply topically 4 (four) times daily. Apply 4 times daily for pain.      Marland Kitchen MAXZIDE 75-50 MG per tablet TAKE 1 TABLET BY MOUTH ONE TIME DAILY  90 tablet  3  . Multiple Vitamins-Minerals (MULTIVITAMIN WITH MINERALS) tablet Take 1 tablet by mouth daily.      . polyethylene glycol powder (GLYCOLAX/MIRALAX)  powder Take 17 g by mouth daily.  3350 g  1  . QUEtiapine (SEROQUEL) 25 MG tablet TAKE 1 TABLET BY MOUTH AT BEDTIME  90 tablet  3  . Rivastigmine 13.3 MG/24HR PT24 Place 1 patch (13.3 mg total) onto the skin daily.  30 patch  6  . Tdap (BOOSTRIX) 5-2.5-18.5 LF-MCG/0.5 injection Inject 0.5 mLs into the muscle once.  0.5 mL  0  . zoster vaccine live, PF, (ZOSTAVAX) 40981 UNT/0.65ML injection Inject 19,400 Units into the skin once.  1 each  0   No current facility-administered medications on file prior to visit.   Physical exam BP 142/80  Pulse 68  Temp(Src) 98.1 F (36.7 C) (Oral)  Wt 162 lb 9.6 oz (73.755 kg)  SpO2 98%  gen- elderly female in NAD HEENT- no LAD, no pallor or icterus Abdomen- bowel sound present, soft, non tender Respiratory- CTAB Ext- able to move all 4  Imaging dexa scan 11/2011 t score -1.9 dexa scan 01/2014 t score -2.2  Assessment/plan  1. Cataract - Ambulatory referral to Ophthalmology  2. Osteopenia With progressively worsening t score will have her started on fosamax 70 mg once a week for her bones. Reviewed care plan with patient and family. Will also have  her continue ca-vit d   3. Hyperlipidemia Tolerating lipitor daily well

## 2014-04-10 ENCOUNTER — Other Ambulatory Visit: Payer: 59

## 2014-04-12 ENCOUNTER — Ambulatory Visit: Payer: 59 | Admitting: Internal Medicine

## 2014-04-12 HISTORY — PX: MOUTH SURGERY: SHX715

## 2014-04-18 ENCOUNTER — Other Ambulatory Visit: Payer: Self-pay | Admitting: Internal Medicine

## 2014-05-10 ENCOUNTER — Other Ambulatory Visit: Payer: Self-pay | Admitting: *Deleted

## 2014-05-10 DIAGNOSIS — E039 Hypothyroidism, unspecified: Secondary | ICD-10-CM

## 2014-05-10 DIAGNOSIS — I159 Secondary hypertension, unspecified: Secondary | ICD-10-CM

## 2014-05-10 DIAGNOSIS — E785 Hyperlipidemia, unspecified: Secondary | ICD-10-CM

## 2014-05-11 ENCOUNTER — Other Ambulatory Visit: Payer: 59

## 2014-05-11 DIAGNOSIS — E039 Hypothyroidism, unspecified: Secondary | ICD-10-CM

## 2014-05-11 DIAGNOSIS — I159 Secondary hypertension, unspecified: Secondary | ICD-10-CM

## 2014-05-11 DIAGNOSIS — E785 Hyperlipidemia, unspecified: Secondary | ICD-10-CM

## 2014-05-12 LAB — CBC WITH DIFFERENTIAL/PLATELET
BASOS ABS: 0 10*3/uL (ref 0.0–0.2)
Basos: 0 %
Eos: 2 %
Eosinophils Absolute: 0.2 10*3/uL (ref 0.0–0.4)
HEMATOCRIT: 42.2 % (ref 34.0–46.6)
Hemoglobin: 14.5 g/dL (ref 11.1–15.9)
Immature Grans (Abs): 0 10*3/uL (ref 0.0–0.1)
Immature Granulocytes: 0 %
LYMPHS ABS: 3.5 10*3/uL — AB (ref 0.7–3.1)
Lymphs: 43 %
MCH: 30.2 pg (ref 26.6–33.0)
MCHC: 34.4 g/dL (ref 31.5–35.7)
MCV: 88 fL (ref 79–97)
MONOCYTES: 6 %
MONOS ABS: 0.5 10*3/uL (ref 0.1–0.9)
Neutrophils Absolute: 4 10*3/uL (ref 1.4–7.0)
Neutrophils Relative %: 49 %
RBC: 4.8 x10E6/uL (ref 3.77–5.28)
RDW: 13.9 % (ref 12.3–15.4)
WBC: 8.2 10*3/uL (ref 3.4–10.8)

## 2014-05-12 LAB — LIPID PANEL
CHOL/HDL RATIO: 3.4 ratio (ref 0.0–4.4)
Cholesterol, Total: 240 mg/dL — ABNORMAL HIGH (ref 100–199)
HDL: 70 mg/dL (ref >39)
LDL Calculated: 153 mg/dL — ABNORMAL HIGH (ref 0–99)
Triglycerides: 87 mg/dL (ref 0–149)
VLDL Cholesterol Cal: 17 mg/dL (ref 5–40)

## 2014-05-12 LAB — COMPREHENSIVE METABOLIC PANEL
ALK PHOS: 71 IU/L (ref 39–117)
ALT: 5 IU/L (ref 0–32)
AST: 10 IU/L (ref 0–40)
Albumin/Globulin Ratio: 1.2 (ref 1.1–2.5)
Albumin: 4.1 g/dL (ref 3.5–4.8)
BUN / CREAT RATIO: 11 (ref 11–26)
BUN: 9 mg/dL (ref 8–27)
CHLORIDE: 101 mmol/L (ref 97–108)
CO2: 20 mmol/L (ref 18–29)
Calcium: 9.9 mg/dL (ref 8.7–10.3)
Creatinine, Ser: 0.81 mg/dL (ref 0.57–1.00)
GFR calc Af Amer: 82 mL/min/{1.73_m2} (ref >59)
GFR calc non Af Amer: 71 mL/min/{1.73_m2} (ref >59)
Globulin, Total: 3.4 g/dL (ref 1.5–4.5)
Glucose: 96 mg/dL (ref 65–99)
Potassium: 4.2 mmol/L (ref 3.5–5.2)
SODIUM: 141 mmol/L (ref 134–144)
Total Bilirubin: 0.7 mg/dL (ref 0.0–1.2)
Total Protein: 7.5 g/dL (ref 6.0–8.5)

## 2014-05-12 LAB — TSH: TSH: 1.34 u[IU]/mL (ref 0.450–4.500)

## 2014-05-16 ENCOUNTER — Ambulatory Visit: Payer: 59 | Admitting: Internal Medicine

## 2014-05-22 ENCOUNTER — Other Ambulatory Visit: Payer: Self-pay | Admitting: Internal Medicine

## 2014-06-08 ENCOUNTER — Ambulatory Visit (INDEPENDENT_AMBULATORY_CARE_PROVIDER_SITE_OTHER): Payer: 59

## 2014-06-08 ENCOUNTER — Ambulatory Visit (INDEPENDENT_AMBULATORY_CARE_PROVIDER_SITE_OTHER): Payer: 59 | Admitting: Nurse Practitioner

## 2014-06-08 ENCOUNTER — Encounter: Payer: Self-pay | Admitting: Nurse Practitioner

## 2014-06-08 VITALS — BP 132/80 | HR 73 | Resp 10 | Ht 66.0 in | Wt 157.0 lb

## 2014-06-08 DIAGNOSIS — K5900 Constipation, unspecified: Secondary | ICD-10-CM

## 2014-06-08 DIAGNOSIS — E785 Hyperlipidemia, unspecified: Secondary | ICD-10-CM

## 2014-06-08 DIAGNOSIS — F03918 Unspecified dementia, unspecified severity, with other behavioral disturbance: Secondary | ICD-10-CM | POA: Insufficient documentation

## 2014-06-08 DIAGNOSIS — Z23 Encounter for immunization: Secondary | ICD-10-CM

## 2014-06-08 DIAGNOSIS — F0391 Unspecified dementia with behavioral disturbance: Secondary | ICD-10-CM

## 2014-06-08 DIAGNOSIS — I10 Essential (primary) hypertension: Secondary | ICD-10-CM

## 2014-06-08 DIAGNOSIS — R634 Abnormal weight loss: Secondary | ICD-10-CM

## 2014-06-08 MED ORDER — ZOSTER VACCINE LIVE 19400 UNT/0.65ML ~~LOC~~ SOLR: 0.6500 mL | Freq: Once | SUBCUTANEOUS | Status: DC

## 2014-06-08 MED ORDER — DESLORATADINE 5 MG PO TABS
5.0000 mg | ORAL_TABLET | Freq: Every day | ORAL | Status: DC
Start: 2014-06-08 — End: 2016-07-04

## 2014-06-08 MED ORDER — EXELON 13.3 MG/24HR TD PT24
MEDICATED_PATCH | TRANSDERMAL | Status: DC
Start: 2014-06-08 — End: 2014-09-01

## 2014-06-08 MED ORDER — MEMANTINE HCL 28 X 5 MG & 21 X 10 MG PO TABS: ORAL_TABLET | ORAL | Status: DC

## 2014-06-08 MED ORDER — MEMANTINE HCL ER 28 MG PO CP24: 28.0000 mg | ORAL_CAPSULE | Freq: Every day | ORAL | Status: DC

## 2014-06-08 MED ORDER — ATORVASTATIN CALCIUM 20 MG PO TABS
20.0000 mg | ORAL_TABLET | Freq: Every day | ORAL | Status: DC
Start: 2014-06-08 — End: 2014-11-08

## 2014-06-08 NOTE — Progress Notes (Signed)
Patient ID: Carol [name], female   DOB: December 06, 1935, 78 y.o.   MRN: [id number]    PCP: Oneal GroutPANDEY, MAHIMA, MD  Allergies  Allergen Reactions  . Penicillins     Rash & swelling    Chief Complaint  Patient presents with  . Medical Management of Chronic Issues    4 month follow-up      HPI: Patient is a 78 y.o. female pt of Dr Glade LloydPandey seen in the office today for 4 month follow up. Pt with a pmh of cataract, hyperlipidemia, anxiety, memory loss, HTN, lumbago. Would like tdap and flu vaccine today. Needs refill on exelon patch   Still cooks occasionally, rarely daughters notice the stove is left on.  Not eating 3 meals a day, sometimes gets up late and will only eat 2 meals a day Turning the heat up in the house a lot.   daughters mostly concerned over increase in behaviors at times, worsening hygiene and not changing clothes daily  Review of Systems:  Review of Systems  Constitutional: Positive for unexpected weight change (weight loss since last visit). Negative for activity change, appetite change and fatigue.  HENT: Negative for congestion and hearing loss.   Eyes: Negative.   Respiratory: Negative for cough and shortness of breath.   Cardiovascular: Negative for chest pain, palpitations and leg swelling.  Gastrointestinal: Negative for abdominal pain, diarrhea and constipation.  Genitourinary: Negative for dysuria and difficulty urinating.  Musculoskeletal: Negative for myalgias and arthralgias.  Skin: Negative for color change and wound.  Neurological: Negative for dizziness and weakness.  Psychiatric/Behavioral: Positive for confusion and agitation. Negative for behavioral problems.       Memory loss with some increase in agitation     Past Medical History  Diagnosis Date  . Cataract   . Rickets, late effect   . Other and unspecified hyperlipidemia   . Anxiety state, unspecified   . Tobacco use disorder   . Depressive disorder, not elsewhere classified   .  Unspecified essential hypertension   . Memory loss   . Lumbago   . Unspecified constipation   . Varicose veins of lower extremities    Past Surgical History  Procedure Laterality Date  . Cyst excision Bilateral     behind ears  . Mouth surgery  04/12/2014   Social History:   reports that she has been smoking.  She does not have any smokeless tobacco history on file. She reports that she does not drink alcohol or use illicit drugs.  Family History  Problem Relation Age of Onset  . Heart disease Father     Medications: Patient's Medications  New Prescriptions   No medications on file  Previous Medications   ALBUTEROL (PROVENTIL HFA;VENTOLIN HFA) 108 (90 BASE) MCG/ACT INHALER    Inhale 1 puff into the lungs every 6 (six) hours as needed for wheezing or shortness of breath.   ALENDRONATE (FOSAMAX) 70 MG TABLET    Take 1 tablet (70 mg total) by mouth every 7 (seven) days. Take with a full glass of water on an empty stomach.   ATORVASTATIN (LIPITOR) 10 MG TABLET    Take 1 tablet (10 mg total) by mouth daily.   CALCIUM CARBONATE-VITAMIN D (CALTRATE 600+D) 600-400 MG-UNIT PER TABLET    Take 1 tablet by mouth 2 (two) times a week.   CARBAMIDE PEROXIDE (DEBROX) 6.5 % OTIC SOLUTION    Place 5 drops into both ears 2 (two) times daily. For a week and then come to  office for ear lavage   DESLORATADINE (CLARINEX) 5 MG TABLET    Take 1 tablet (5 mg total) by mouth daily. Take 1 tablet daily as needed for allergies.   EXELON 13.3 MG/24HR PT24    PLACE 1 PATCH ONTO THE SKIN EVERY DAY, ROTATE SITES   HYDROCODONE-ACETAMINOPHEN (NORCO/VICODIN) 5-325 MG PER TABLET    1/2-1 by mouth every 4-6 hours as needed for pain   HYDROCORTISONE ACETATE ([employer]S HC-1% HEMORRHOID) 1 % OINT    Apply topically 4 (four) times daily. Apply 4 times daily for pain.   MAXZIDE 75-50 MG PER TABLET    TAKE 1 TABLET BY MOUTH ONE TIME DAILY   MULTIPLE VITAMINS-MINERALS (MULTIVITAMIN WITH MINERALS) TABLET    Take 1 tablet by mouth  daily.   POLYETHYLENE GLYCOL POWDER (GLYCOLAX/MIRALAX) POWDER    Take 17 g by mouth daily.   QUETIAPINE (SEROQUEL) 25 MG TABLET    TAKE 1 TABLET BY MOUTH AT BEDTIME   TDAP (BOOSTRIX) 5-2.5-18.5 LF-MCG/0.5 INJECTION    Inject 0.5 mLs into the muscle once.   ZOSTER VACCINE LIVE, PF, (ZOSTAVAX) 16109 UNT/0.65ML INJECTION    Inject 19,400 Units into the skin once.  Modified Medications   No medications on file  Discontinued Medications   RIVASTIGMINE 13.3 MG/24HR PT24    Place 1 patch (13.3 mg total) onto the skin daily.     Physical Exam:  Filed Vitals:   06/08/14 0820  BP: 132/80  Pulse: 73  Resp: 10  Height: 5\' 6"  (1.676 m)  Weight: 157 lb (71.215 kg)  SpO2: 97%    Physical Exam  Constitutional: She is oriented to person, place, and time. She appears well-developed and well-nourished. No distress.  HENT:  Head: Normocephalic and atraumatic.  Mouth/Throat: Oropharynx is clear and moist.  Eyes: Conjunctivae and EOM are normal. Pupils are equal, round, and reactive to light.  Neck: Normal range of motion. Neck supple. No JVD present.  Cardiovascular: Normal rate, regular rhythm, normal heart sounds and intact distal pulses.   No murmur heard. Pulmonary/Chest: Effort normal and breath sounds normal. No respiratory distress. She has no wheezes. She has no rales. She exhibits no tenderness.  Abdominal: Soft. Bowel sounds are normal. She exhibits no distension and no mass. There is no tenderness. There is no guarding.  Musculoskeletal: Normal range of motion. She exhibits no edema or tenderness.  Lymphadenopathy:    She has no cervical adenopathy.  Neurological: She is alert and oriented to person, place, and time. No cranial nerve deficit.  Skin: Skin is warm and dry. She is not diaphoretic.  Psychiatric: She has a normal mood and affect. Her behavior is normal.    Labs reviewed: Basic Metabolic Panel:  Recent Labs  60/45/40 1632 05/11/14 0840  NA 145* 141  K 3.9 4.2  CL  104 101  CO2 23 20  GLUCOSE 90 96  BUN 9 9  CREATININE 0.98 0.81  CALCIUM 9.5 9.9  TSH  --  1.340   Liver Function Tests:  Recent Labs  09/14/13 1632 05/11/14 0840  AST 14 10  ALT 5 5  ALKPHOS 66 71  BILITOT 0.5 0.7  PROT 7.3 7.5   No results for input(s): LIPASE, AMYLASE in the last 8760 hours. No results for input(s): AMMONIA in the last 8760 hours. CBC:  Recent Labs  09/14/13 1632 05/11/14 0840  WBC 8.0 8.2  NEUTROABS 3.7 4.0  HGB 13.8 14.5  HCT 40.4 42.2  MCV 87 88   Lipid Panel:  Recent Labs  09/14/13 1632 05/11/14 0840  HDL 63 70  LDLCALC 147* 153*  TRIG 87 87  CHOLHDL 3.6 3.4   TSH:  Recent Labs  05/11/14 0840  TSH 1.340   A1C: Lab Results  Component Value Date   HGBA1C * 09/19/2009    6.5 (NOTE)                                                                       According to the ADA Clinical Practice Recommendations for 2011, when HbA1c is used as a screening test:   >=6.5%   Diagnostic of Diabetes Mellitus           (if abnormal result  is confirmed)  5.7-6.4%   Increased risk of developing Diabetes Mellitus  References:Diagnosis and Classification of Diabetes Mellitus,Diabetes Care,2011,34(Suppl 1):S62-S69 and Standards of Medical Care in         Diabetes - 2011,Diabetes Care,2011,34  (Suppl 1):S11-S61.     Assessment/Plan  1. Need for shingles vaccine - zoster vaccine live, PF, (ZOSTAVAX) [id number] UNT/0.65ML injection; Inject 19,400 Units into the skin once.  Dispense: 1 each; Refill: 0  2. Dementia with behavioral disturbance - discussed concerns regarding safety with pt cooking and leaving oven/stove on. Daughters are monitoring this -conts on seroquel due to behaviors, sometimes worse -will start namenda - memantine XR (NAMENDA TITRATION PAK) tablet pack given, instructions to fill Rx after titration pack is complete  - Memantine HCl ER 28 MG CP24; Take 28 mg by mouth daily.  Dispense: 30 capsule; Refill: 1 - EXELON 13.3 MG/24HR  PT24; PLACE 1 PATCH ONTO THE SKIN EVERY DAY, ROTATE SITES  Dispense: 30 patch; Refill: 3 -MMSE 25/30 in July, will have staff follow up MMSE at next visit  3. Hyperlipemia -LDL worse, will increase lipitor -discussed proper food choices - atorvastatin (LIPITOR) 20 MG tablet; Take 1 tablet (20 mg total) by mouth daily.  Dispense: 30 tablet; Refill: 3  4. Need for diphtheria-tetanus-pertussis (Tdap) vaccine - Tdap vaccine greater than or equal to 7yo IM given today in office   5. Essential hypertension, benign Controlled on medication  6. Constipation, unspecified constipation type -controlled  7. Weight loss -noted from last visit, likely due to progressive memory loss -educated on proper nutritional choices, 3 meals a day, may use ensure between meals  -daughter reports they are checking in more often with meals at this time -cont to follow   Follow up in 6 weeks with Dr Glade LloydPandey

## 2014-06-08 NOTE — Patient Instructions (Addendum)
Will start namenda titration pack to help with memory loss-use as directed Once complete start namenda XR 28 mg daily -- to get Rx from pharmacy  Will increase lipitor to 20 mg daily   Follow up with Dr Glade LloydPandey in 6 weeks

## 2014-06-20 ENCOUNTER — Other Ambulatory Visit: Payer: Self-pay | Admitting: Nurse Practitioner

## 2014-06-22 NOTE — Telephone Encounter (Signed)
Called the pharmacy to confirmed rx was received on 06/09/15

## 2014-06-25 IMAGING — CT CT HEAD W/O CM
2 series · 16 of 30 positions shown, 20 images · non-contrast
Comparison: MRI brain 09/19/2009.

CLINICAL DATA: Memory loss and cognitive impairment.

EXAM:
CT HEAD WITHOUT CONTRAST
TECHNIQUE: Contiguous axial images were obtained from the base of the skull
through the vertex without intravenous contrast.

[Series 2: head w/o · axial · non-contrast · 0.49mm/px · z∈[+25,+151]mm · 13 of 28 slices shown, 17 images]
[im 2/28  brain]
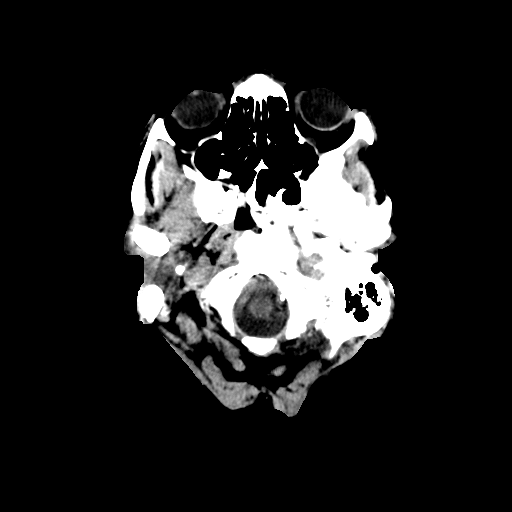
[im 2/28  bone]
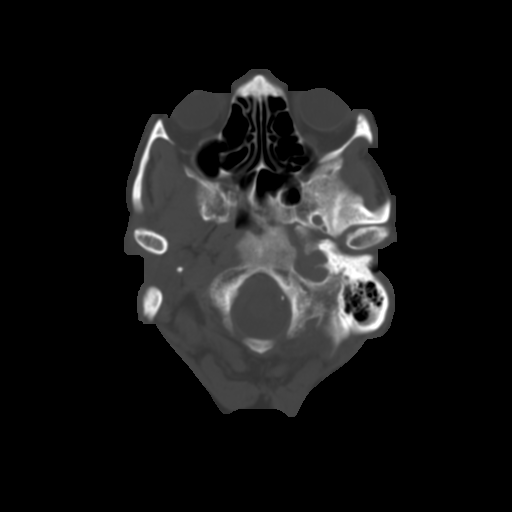
[im 4/28  brain]
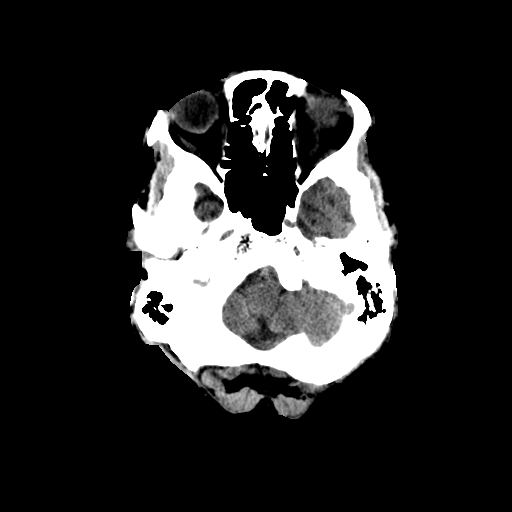
[im 6/28  brain]
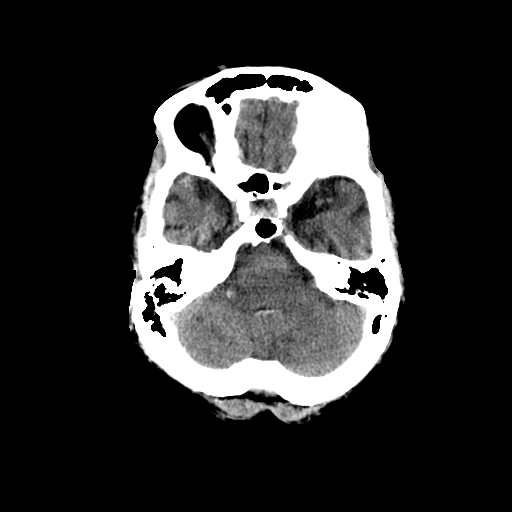
[im 8/28  brain]
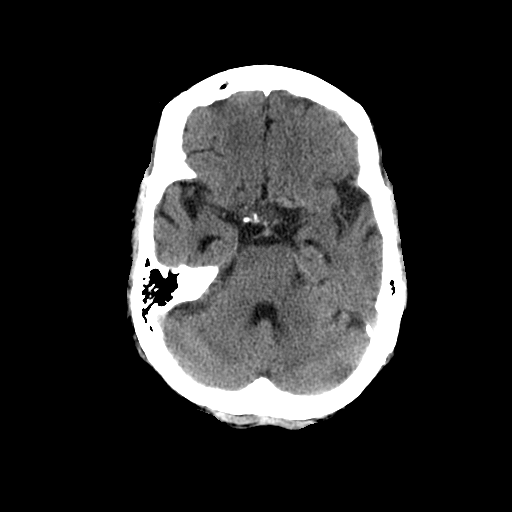
[im 10/28  brain]
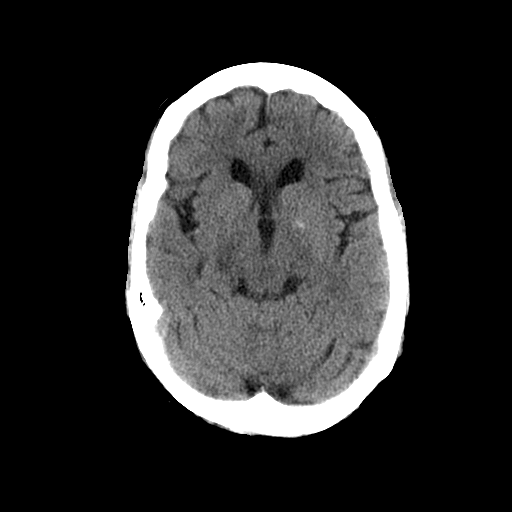
[im 10/28  bone]
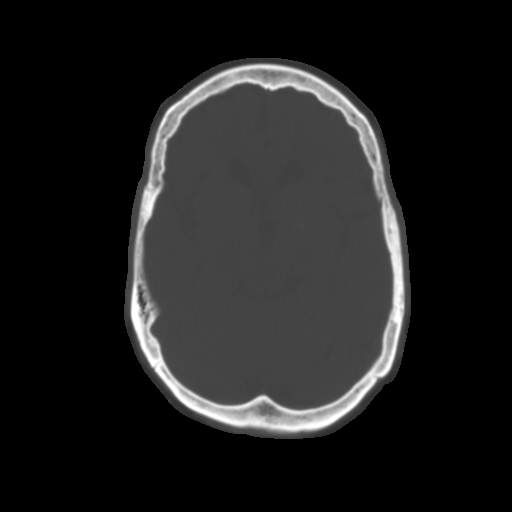
[im 12/28  brain]
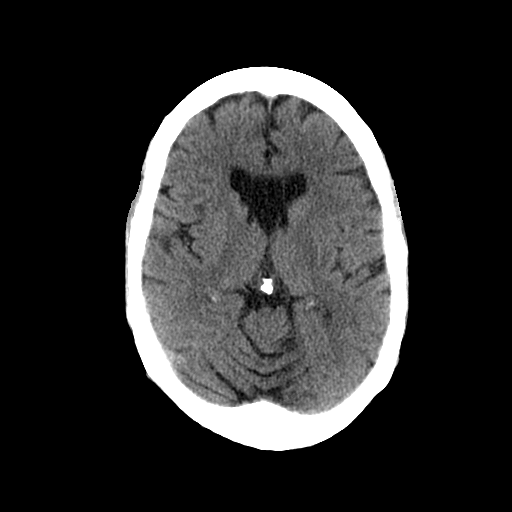
[im 14/28  brain]
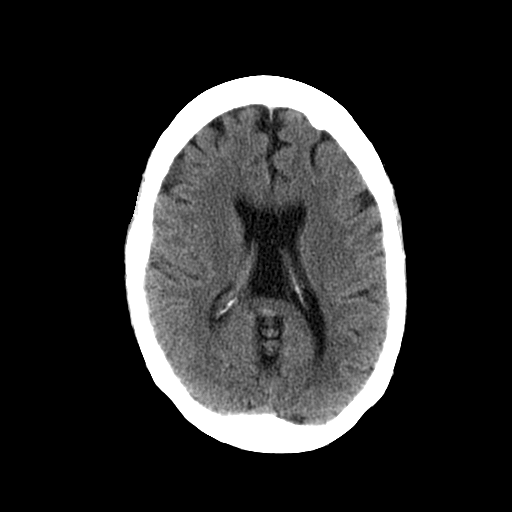
[im 16/28  brain]
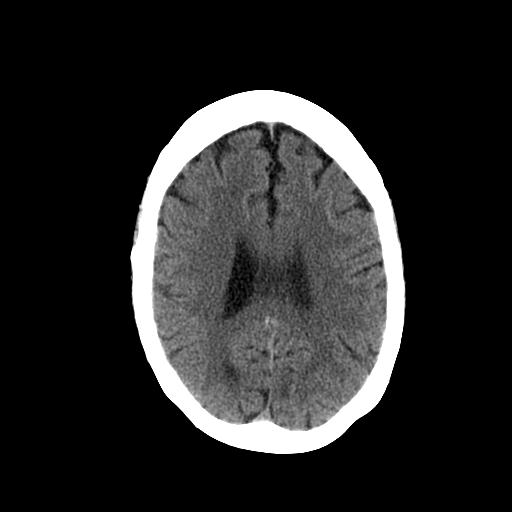
[im 18/28  brain]
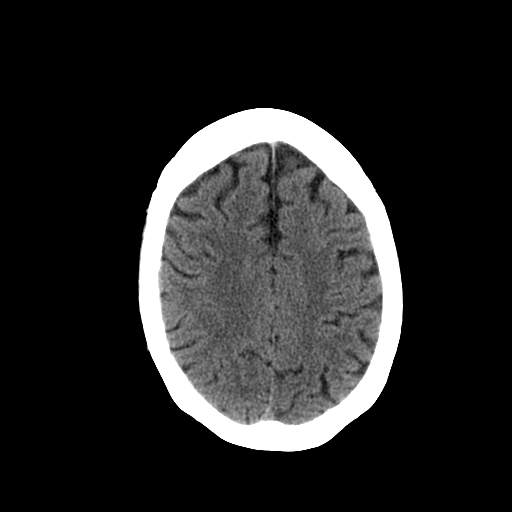
[im 18/28  bone]
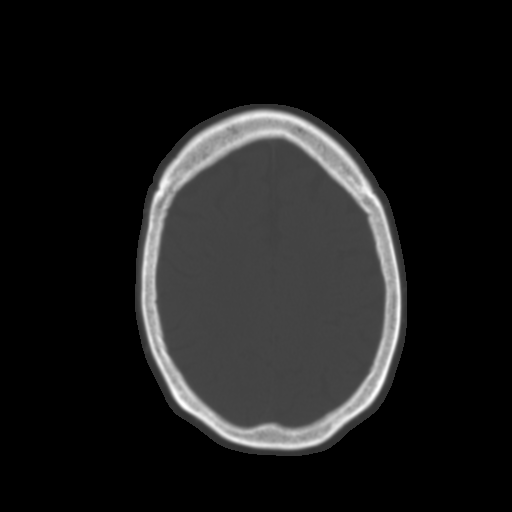
[im 20/28  brain]
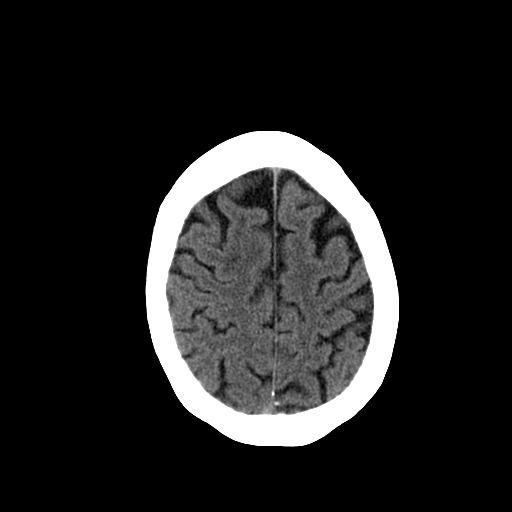
[im 22/28  brain]
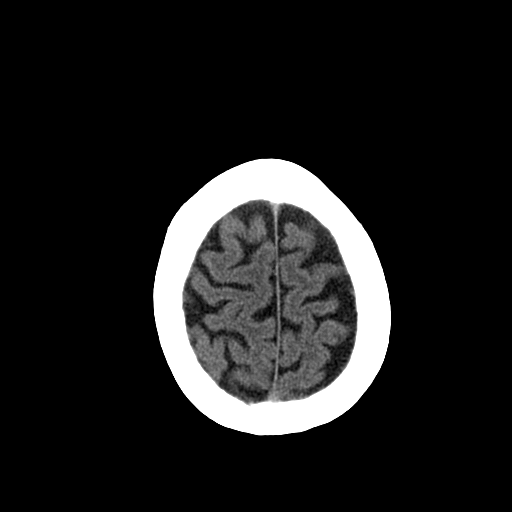
[im 24/28  brain]
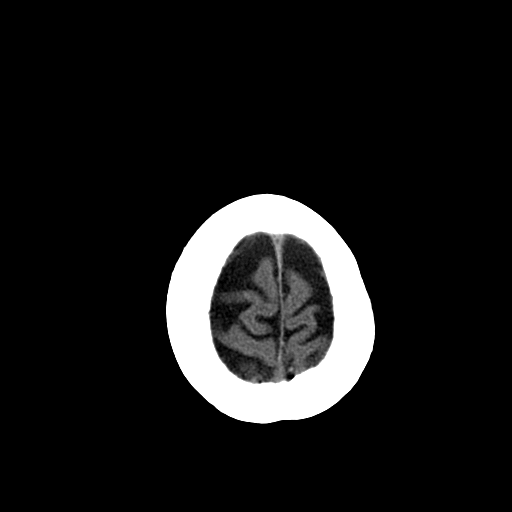
[im 26/28  brain]
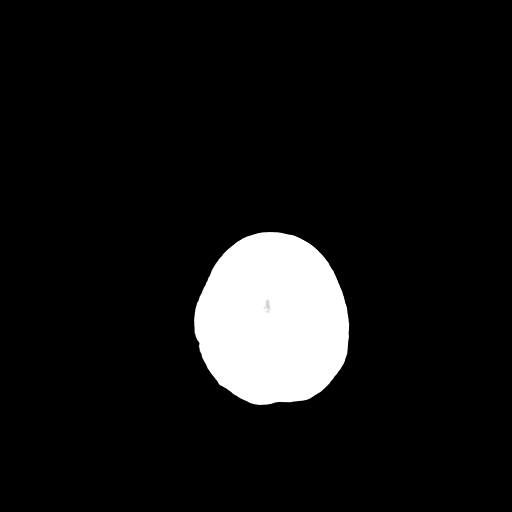
[im 26/28  bone]
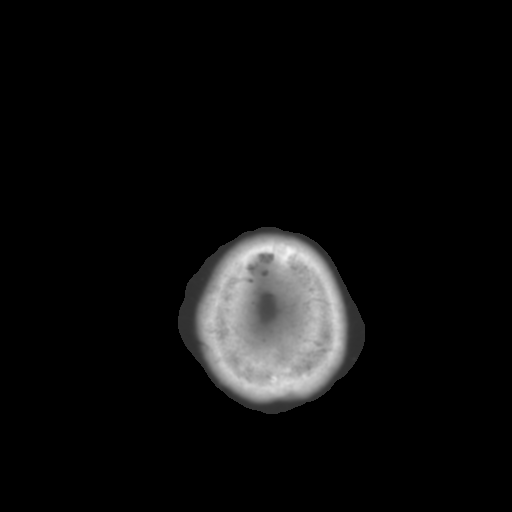

[Series 3: head bone · axial · 0.49mm/px · z∈[+25,+67]mm · 3 of 28 slices shown]
[im 2/28  bone]
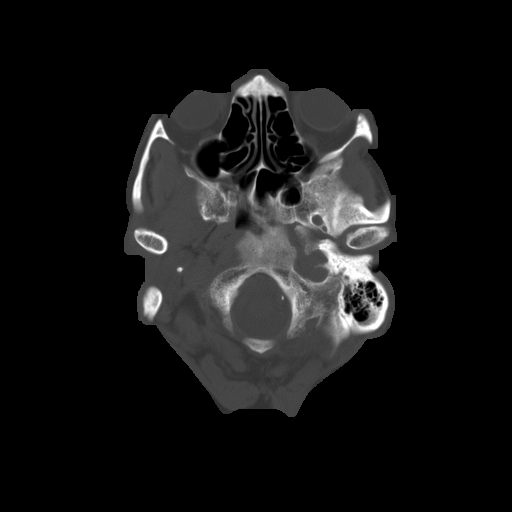
[im 6/28  bone]
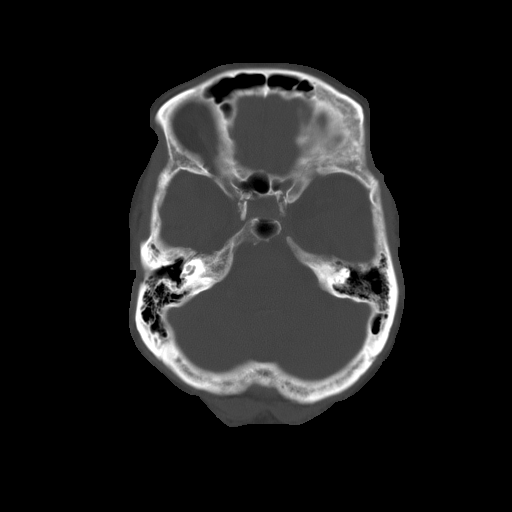
[im 10/28  bone]
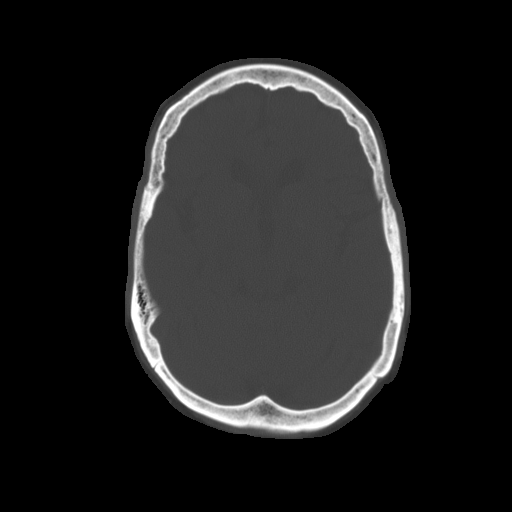

[16 of 30 positions shown; findings below may reference images not displayed]

FINDINGS: Mild atrophy and chronic microvascular ischemic change. Moderate
supra vermian atrophy. Cavum septum pellucidum. No acute stroke or
hemorrhage. Calvarium intact. Tiny osteoma left frontal bone
projects inward. No extra-axial fluid collections. Normal sized
ventricles for age. Scalp and extracranial soft tissues
unremarkable. No sinus or mastoid disease.
IMPRESSION: Chronic changes as described.  Similar appearance to prior MR.

## 2014-07-25 ENCOUNTER — Encounter: Payer: Self-pay | Admitting: Internal Medicine

## 2014-07-25 ENCOUNTER — Ambulatory Visit (INDEPENDENT_AMBULATORY_CARE_PROVIDER_SITE_OTHER): Payer: 59 | Admitting: Internal Medicine

## 2014-07-25 ENCOUNTER — Other Ambulatory Visit: Payer: Self-pay | Admitting: *Deleted

## 2014-07-25 VITALS — BP 132/78 | HR 90 | Temp 98.1°F | Resp 10 | Ht 66.0 in | Wt 156.0 lb

## 2014-07-25 DIAGNOSIS — Z23 Encounter for immunization: Secondary | ICD-10-CM

## 2014-07-25 DIAGNOSIS — E785 Hyperlipidemia, unspecified: Secondary | ICD-10-CM

## 2014-07-25 DIAGNOSIS — H6123 Impacted cerumen, bilateral: Secondary | ICD-10-CM

## 2014-07-25 DIAGNOSIS — F0391 Unspecified dementia with behavioral disturbance: Secondary | ICD-10-CM

## 2014-07-25 DIAGNOSIS — F03918 Unspecified dementia, unspecified severity, with other behavioral disturbance: Secondary | ICD-10-CM

## 2014-07-25 MED ORDER — MEMANTINE HCL ER 28 MG PO CP24: 28.0000 mg | ORAL_CAPSULE | Freq: Every day | ORAL | Status: DC

## 2014-07-25 MED ORDER — TETANUS-DIPHTH-ACELL PERTUSSIS 5-2.5-18.5 LF-MCG/0.5 IM SUSP: 0.5000 mL | Freq: Once | INTRAMUSCULAR | Status: DC

## 2014-07-25 MED ORDER — ZOSTER VACCINE LIVE 19400 UNT/0.65ML ~~LOC~~ SOLR: 0.6500 mL | Freq: Once | SUBCUTANEOUS | Status: DC

## 2014-07-25 MED ORDER — QUETIAPINE FUMARATE 25 MG PO TABS: 12.5000 mg | ORAL_TABLET | Freq: Every day | ORAL | Status: DC

## 2014-07-25 MED ORDER — QUETIAPINE FUMARATE 25 MG PO TABS: ORAL_TABLET | ORAL | Status: DC

## 2014-07-25 MED ORDER — CARBAMIDE PEROXIDE 6.5 % OT SOLN: 5.0000 [drp] | Freq: Two times a day (BID) | OTIC | Status: AC

## 2014-07-25 NOTE — Progress Notes (Signed)
Patient ID: Carol [name], female   DOB: May 01, 1936, 79 y.o.   MRN: [id number]    Chief Complaint  Patient presents with  . Medical Management of Chronic Issues    6 week follow-up, discuss medication    Allergies  Allergen Reactions  . Penicillins     Rash & swelling   HPI 79 y/o female patient is here for routine follow up. She has tolerated namenda xr titration pack well. Has now picked up her script. Taking her exelon patch Se is taking her zocor and tolerating it well bp has been controlled with current regimen Bowel movement regular, has not required any stool softener Her daughter is present this visit No recent behavioral changes Continues to smoke and is taking her fosamax  Review of Systems   Constitutional: Negative for fever, chills, malaise/fatigue and diaphoresis.   HENT: Negative for ear pain, chest congestion and sore throat.    Eyes: Negative for blurred vision and double vision. Wears corrective lenses.  Respiratory: Negative for cough, wheezing and shortness of breath. smoking at present Cardiovascular: Negative for chest pain, palpitations, claudication and leg swelling. Has varicose veins  Gastrointestinal: Negative for heartburn, nausea, vomiting, abdominal pain, melena and constipation.   Genitourinary: Negative for dysuria and flank pain. Denies nocturia Musculoskeletal: Negative for joint pain and falls.   Skin: Negative for itching or rash.   Neurological: Negative for dizziness, seizures, loss of consciousness and headaches.   Psychiatric/Behavioral: Positive for memory loss.Negative for depression. The patient is not nervous/anxious and does not have insomnia.     Past Medical History  Diagnosis Date  . Cataract   . Rickets, late effect   . Other and unspecified hyperlipidemia   . Anxiety state, unspecified   . Tobacco use disorder   . Depressive disorder, not elsewhere classified   . Unspecified essential hypertension   . Memory loss   .  Lumbago   . Unspecified constipation   . Varicose veins of lower extremities    Current Outpatient Prescriptions on File Prior to Visit  Medication Sig Dispense Refill  . alendronate (FOSAMAX) 70 MG tablet Take 1 tablet (70 mg total) by mouth every 7 (seven) days. Take with a full glass of water on an empty stomach. 4 tablet 11  . atorvastatin (LIPITOR) 20 MG tablet Take 1 tablet (20 mg total) by mouth daily. 30 tablet 3  . Calcium Carbonate-Vitamin D (CALTRATE 600+D) 600-400 MG-UNIT per tablet Take 1 tablet by mouth 2 (two) times a week.    . desloratadine (CLARINEX) 5 MG tablet Take 1 tablet (5 mg total) by mouth daily. Take 1 tablet daily as needed for allergies. 30 tablet 5  . EXELON 13.3 MG/24HR PT24 PLACE 1 PATCH ONTO THE SKIN EVERY DAY, ROTATE SITES 30 patch 3  . MAXZIDE 75-50 MG per tablet TAKE 1 TABLET BY MOUTH ONE TIME DAILY 90 tablet 3  . Multiple Vitamins-Minerals (MULTIVITAMIN WITH MINERALS) tablet Take 1 tablet by mouth daily.    . polyethylene glycol powder (GLYCOLAX/MIRALAX) powder Take 17 g by mouth daily. 3350 g 1   No current facility-administered medications on file prior to visit.   Physical exam BP 132/78 mmHg  Pulse 90  Temp(Src) 98.1 F (36.7 C) (Oral)  Resp 10  Ht  (1.676 m)  Wt 156 lb (70.761 kg)  BMI 25.19 kg/m2  SpO2 94%  General- elderly female in no acute distress Head- atraumatic, normocephalic Eyes- PERRLA, EOMI, no pallor, no icterus, no discharge Ears- impacted  cerumen in both ears Neck- no lymphadenopathy, no thyromegaly, no jugular vein distension, no carotid bruit Mouth- normal mucus membrane, no oral thrush, poor dentition, has partial dentures, has a mass in palate region Cardiovascular- normal s1,s2, no murmurs/ rubs/ gallops, normal distal pulses Respiratory-has expiratory wheeze, no rhonchi, no crackles Abdomen- bowel sounds present, soft, non tender Musculoskeletal- able to move all 4 extremities, normal range of motion, no leg  edema Neurological- no focal deficit Skin- warm and dry Psychiatry- alert and oriented to person, place and time, normal mood and affect  labs  Lipid Panel     Component Value Date/Time   TRIG 87 05/11/2014 0840   HDL 70 05/11/2014 0840   CHOLHDL 3.4 05/11/2014 0840   LDLCALC 153* 05/11/2014 0840   CMP Latest Ref Rng 05/11/2014 09/14/2013 01/25/2013  Glucose 65 - 99 mg/dL 96 90 86  BUN 8 - 27 mg/dL 9 9 12   Creatinine 0.57 - 1.00 mg/dL 1.910.81 4.780.98 2.950.88  Sodium 134 - 144 mmol/L 141 145(H) 143  Potassium 3.5 - 5.2 mmol/L 4.2 3.9 4.2  Chloride 97 - 108 mmol/L 101 104 104  CO2 18 - 29 mmol/L 20 23 24   Calcium 8.7 - 10.3 mg/dL 9.9 9.5 9.6  Total Protein 6.0 - 8.5 g/dL 7.5 7.3 7.0  Albumin 3.5 - 4.8 g/dL 4.1 4.1 4.2  Total Bilirubin 0.0 - 1.2 mg/dL 0.7 0.5 0.2  Alkaline Phos 39 - 117 IU/L 71 66 67  AST 0 - 40 IU/L 10 14 14   ALT 0 - 32 IU/L 5 5 7    CBC Latest Ref Rng 05/11/2014 09/14/2013 01/25/2013  WBC 3.4 - 10.8 x10E3/uL 8.2 8.0 8.2  Hemoglobin 11.1 - 15.9 g/dL 62.114.5 30.813.8 65.713.4  Hematocrit 34.0 - 46.6 % 42.2 40.4 39.7  Platelets 150 - 400 K/uL - - -    Assessment/plan  1. Dementia with behavioral disturbance Continue exelon patch, continue namenda xr 28 mg daily. Monitor weight. Behavior stable, decrease seroquel to 12.5 mg daily with attempt for GDR - memantine (NAMENDA XR) 28 MG CP24 24 hr capsule; Take 1 capsule (28 mg total) by mouth daily.  Dispense: 30 capsule; Refill: 1  2. Need for prophylactic vaccination with combined diphtheria-tetanus-pertussis (DTP) vaccine - Tdap (BOOSTRIX) 5-2.5-18.5 LF-MCG/0.5 injection; Inject 0.5 mLs into the muscle once.  Dispense: 0.5 mL; Refill: 0  3. Need for shingles vaccine - zoster vaccine live, PF, (ZOSTAVAX) [id number] UNT/0.65ML injection; Inject 19,400 Units into the skin once.  Dispense: 1 each; Refill: 0  4. Hyperlipidemia Continue lipitor 20 mg daily, check lft and lipid panel prior to next visit - CMP; Future - Lipid Panel; Future  5.  Cerumen debris on tympanic membrane, bilateral Debrox ear drops bid to both ears and then ear lavage

## 2014-07-25 NOTE — Telephone Encounter (Signed)
Pharmacy questioned directions on Seroquel. Confirmed with Dr. Glade LloydPandey -Seroquel 25mg  1/2 tablets at bedtime.

## 2014-08-01 ENCOUNTER — Encounter: Payer: Self-pay | Admitting: Internal Medicine

## 2014-08-31 ENCOUNTER — Telehealth: Payer: Self-pay | Admitting: *Deleted

## 2014-08-31 NOTE — Telephone Encounter (Signed)
Called daughter Carol [name]( [name]) To inform her that the insurance company stated that no authorization was needed for the Exelon patch 13.3 mg. She stated that was good and that she would go pick up her prescription at the pharmacy.

## 2014-09-01 ENCOUNTER — Other Ambulatory Visit: Payer: Self-pay | Admitting: Internal Medicine

## 2014-10-19 ENCOUNTER — Other Ambulatory Visit: Payer: Medicare PPO

## 2014-10-19 DIAGNOSIS — E785 Hyperlipidemia, unspecified: Secondary | ICD-10-CM

## 2014-10-20 LAB — COMPREHENSIVE METABOLIC PANEL
A/G RATIO: 1.1 (ref 1.1–2.5)
ALT: 7 IU/L (ref 0–32)
AST: 12 IU/L (ref 0–40)
Albumin: 3.7 g/dL (ref 3.5–4.8)
Alkaline Phosphatase: 67 IU/L (ref 39–117)
BILIRUBIN TOTAL: 0.5 mg/dL (ref 0.0–1.2)
BUN/Creatinine Ratio: 13 (ref 11–26)
BUN: 10 mg/dL (ref 8–27)
CO2: 19 mmol/L (ref 18–29)
CREATININE: 0.8 mg/dL (ref 0.57–1.00)
Calcium: 9.2 mg/dL (ref 8.7–10.3)
Chloride: 103 mmol/L (ref 97–108)
GFR calc Af Amer: 83 mL/min/{1.73_m2} (ref >59)
GFR, EST NON AFRICAN AMERICAN: 72 mL/min/{1.73_m2} (ref >59)
GLOBULIN, TOTAL: 3.4 g/dL (ref 1.5–4.5)
Glucose: 99 mg/dL (ref 65–99)
POTASSIUM: 4.3 mmol/L (ref 3.5–5.2)
SODIUM: 140 mmol/L (ref 134–144)
TOTAL PROTEIN: 7.1 g/dL (ref 6.0–8.5)

## 2014-10-20 LAB — LIPID PANEL
CHOLESTEROL TOTAL: 152 mg/dL (ref 100–199)
Chol/HDL Ratio: 2.3 ratio units (ref 0.0–4.4)
HDL: 65 mg/dL (ref >39)
LDL Calculated: 75 mg/dL (ref 0–99)
Triglycerides: 62 mg/dL (ref 0–149)
VLDL CHOLESTEROL CAL: 12 mg/dL (ref 5–40)

## 2014-10-24 ENCOUNTER — Ambulatory Visit: Payer: 59 | Admitting: Internal Medicine

## 2014-10-25 ENCOUNTER — Ambulatory Visit: Payer: 59 | Admitting: Internal Medicine

## 2014-11-08 ENCOUNTER — Encounter: Payer: Self-pay | Admitting: Internal Medicine

## 2014-11-08 ENCOUNTER — Ambulatory Visit (INDEPENDENT_AMBULATORY_CARE_PROVIDER_SITE_OTHER): Payer: Medicare PPO | Admitting: Internal Medicine

## 2014-11-08 VITALS — BP 120/78 | HR 76 | Temp 98.3°F | Resp 20 | Ht 66.0 in | Wt 156.0 lb

## 2014-11-08 DIAGNOSIS — F17200 Nicotine dependence, unspecified, uncomplicated: Secondary | ICD-10-CM

## 2014-11-08 DIAGNOSIS — F0150 Vascular dementia without behavioral disturbance: Secondary | ICD-10-CM

## 2014-11-08 DIAGNOSIS — F329 Major depressive disorder, single episode, unspecified: Secondary | ICD-10-CM | POA: Diagnosis not present

## 2014-11-08 DIAGNOSIS — F0280 Dementia in other diseases classified elsewhere without behavioral disturbance: Secondary | ICD-10-CM | POA: Diagnosis not present

## 2014-11-08 DIAGNOSIS — Z720 Tobacco use: Secondary | ICD-10-CM | POA: Diagnosis not present

## 2014-11-08 DIAGNOSIS — I10 Essential (primary) hypertension: Secondary | ICD-10-CM

## 2014-11-08 DIAGNOSIS — G309 Alzheimer's disease, unspecified: Secondary | ICD-10-CM | POA: Diagnosis not present

## 2014-11-08 DIAGNOSIS — E785 Hyperlipidemia, unspecified: Secondary | ICD-10-CM

## 2014-11-08 DIAGNOSIS — F0393 Unspecified dementia, unspecified severity, with mood disturbance: Secondary | ICD-10-CM

## 2014-11-08 MED ORDER — ATORVASTATIN CALCIUM 20 MG PO TABS: 20.0000 mg | ORAL_TABLET | Freq: Every day | ORAL | Status: DC

## 2014-11-08 MED ORDER — MAXZIDE 75-50 MG PO TABS: ORAL_TABLET | ORAL | Status: DC

## 2014-11-08 MED ORDER — EXELON 13.3 MG/24HR TD PT24: MEDICATED_PATCH | TRANSDERMAL | Status: DC

## 2014-11-08 MED ORDER — QUETIAPINE FUMARATE 25 MG PO TABS: ORAL_TABLET | ORAL | Status: DC

## 2014-11-08 NOTE — Progress Notes (Signed)
Patient ID: Carol [name], female   DOB: 1936/03/07, 79 y.o.   MRN: [id number]    Location:    [facility]   Place of Service:  OFFICE     Chief Complaint  Patient presents with  . Follow-up    Follow-up for lipids, memory, behavior    HPI:  79 yo female seen today for f/u. She continues to repeat herself, poor hygiene and does not bathe often, mood swings. She is taking all meds as rx. Recent labs were stable. Daughters make sure meds are taken. Spouse lives with pt. He is retired Hotel manager. She drives within neighborhood.  She is still smoking cigs. She must smoke 1 with her AM coffee.  Pt feels isolated. She is depressed. She did better with namenda xr and seroquel but is out of namenda and never rec'd Rx from pharmacy.  She is a poor historian due to dementia. Hx obtained from chart and daughters   Past Medical History  Diagnosis Date  . Cataract   . Rickets, late effect   . Other and unspecified hyperlipidemia   . Anxiety state, unspecified   . Tobacco use disorder   . Depressive disorder, not elsewhere classified   . Unspecified essential hypertension   . Memory loss   . Lumbago   . Unspecified constipation   . Varicose veins of lower extremities     Past Surgical History  Procedure Laterality Date  . Cyst excision Bilateral     behind ears  . Mouth surgery  04/12/2014    Patient Care Team: Kirt Boys, DO as PCP - General (Internal Medicine)  History   Social History  . Marital Status: Married    Spouse Name: N/A  . Number of Children: N/A  . Years of Education: N/A   Occupational History  . Not on file.   Social History Main Topics  . Smoking status: Current Every Day Smoker  . Smokeless tobacco: Never Used     Comment: No more than three cig daily   . Alcohol Use: No  . Drug Use: No  . Sexual Activity: Not on file   Other Topics Concern  . Not on file   Social History Narrative     reports that she has been smoking.  She has never used  smokeless tobacco. She reports that she does not drink alcohol or use illicit drugs.  Allergies  Allergen Reactions  . Penicillins     Rash & swelling    Medications: Patient's Medications  New Prescriptions   No medications on file  Previous Medications   ALENDRONATE (FOSAMAX) 70 MG TABLET    Take 1 tablet (70 mg total) by mouth every 7 (seven) days. Take with a full glass of water on an empty stomach.   ATORVASTATIN (LIPITOR) 20 MG TABLET    Take 1 tablet (20 mg total) by mouth daily.   CALCIUM CARBONATE-VITAMIN D (CALTRATE 600+D) 600-400 MG-UNIT PER TABLET    Take 1 tablet by mouth 2 (two) times a week.   DESLORATADINE (CLARINEX) 5 MG TABLET    Take 1 tablet (5 mg total) by mouth daily. Take 1 tablet daily as needed for allergies.   EXELON 13.3 MG/24HR PT24    PLACE 1 PATCH ON SKIN EVERY DAY, ROTATE SITES   MAXZIDE 75-50 MG PER TABLET    TAKE 1 TABLET BY MOUTH ONE TIME DAILY   MEMANTINE (NAMENDA XR) 28 MG CP24 24 HR CAPSULE    Take 1 capsule (28 mg  total) by mouth daily.   MULTIPLE VITAMINS-MINERALS (MULTIVITAMIN WITH MINERALS) TABLET    Take 1 tablet by mouth daily.   POLYETHYLENE GLYCOL POWDER (GLYCOLAX/MIRALAX) POWDER    Take 17 g by mouth daily.   QUETIAPINE (SEROQUEL) 25 MG TABLET    Take 1/2 tablet by mouth at bedtime   TDAP (BOOSTRIX) 5-2.5-18.5 LF-MCG/0.5 INJECTION    Inject 0.5 mLs into the muscle once.   ZOSTER VACCINE LIVE, PF, (ZOSTAVAX) 16109 UNT/0.65ML INJECTION    Inject 19,400 Units into the skin once.  Modified Medications   No medications on file  Discontinued Medications   No medications on file    Review of Systems  Unable to perform ROS: Dementia    Filed Vitals:   11/08/14 1432  BP: 120/78  Pulse: 76  Temp: 98.3 F (36.8 C)  TempSrc: Oral  Resp: 20  Height:  (1.676 m)  Weight: 156 lb (70.761 kg)  SpO2: 97%   Body mass index is 25.19 kg/(m^2).  Physical Exam  Constitutional: She appears well-developed and well-nourished.  HENT:    Mouth/Throat: Oropharynx is clear and moist. No oropharyngeal exudate.  Eyes: Pupils are equal, round, and reactive to light. No scleral icterus.  Neck: Neck supple. No tracheal deviation present. No thyromegaly present.  Cardiovascular: Normal rate, regular rhythm, normal heart sounds and intact distal pulses.  Exam reveals no gallop and no friction rub.   No murmur heard. No LE edema b/l. no calf TTP. No carotid bruit b/l  Pulmonary/Chest: Effort normal and breath sounds normal. No stridor. No respiratory distress. She has no wheezes. She has no rales.  Abdominal: Soft. Bowel sounds are normal. She exhibits no distension and no mass. There is no tenderness. There is no rebound and no guarding.  Lymphadenopathy:    She has no cervical adenopathy.  Neurological: She is alert. She has normal reflexes.  Skin: Skin is warm and dry. No rash noted.  Psychiatric: Her behavior is normal. Her affect is blunt (flat affect). She exhibits a depressed mood.  She repeats herself often during the exam     Labs reviewed: Appointment on 10/19/2014  Component Date Value Ref Range Status  . Glucose 10/19/2014 99  65 - 99 mg/dL Final  . BUN 60/45/4098 10  8 - 27 mg/dL Final  . Creatinine, Ser 10/19/2014 0.80  0.57 - 1.00 mg/dL Final  . GFR calc non Af Amer 10/19/2014 72  >59 mL/min/1.73 Final  . GFR calc Af Amer 10/19/2014 83  >59 mL/min/1.73 Final  . BUN/Creatinine Ratio 10/19/2014 13  11 - 26 Final  . Sodium 10/19/2014 140  134 - 144 mmol/L Final  . Potassium 10/19/2014 4.3  3.5 - 5.2 mmol/L Final  . Chloride 10/19/2014 103  97 - 108 mmol/L Final  . CO2 10/19/2014 19  18 - 29 mmol/L Final  . Calcium 10/19/2014 9.2  8.7 - 10.3 mg/dL Final  . Total Protein 10/19/2014 7.1  6.0 - 8.5 g/dL Final  . Albumin 11/91/4782 3.7  3.5 - 4.8 g/dL Final  . Globulin, Total 10/19/2014 3.4  1.5 - 4.5 g/dL Final  . Albumin/Globulin Ratio 10/19/2014 1.1  1.1 - 2.5 Final  . Bilirubin Total 10/19/2014 0.5  0.0 - 1.2  mg/dL Final  . Alkaline Phosphatase 10/19/2014 67  39 - 117 IU/L Final  . AST 10/19/2014 12  0 - 40 IU/L Final  . ALT 10/19/2014 7  0 - 32 IU/L Final  . Cholesterol, Total 10/19/2014 152  100 -  199 mg/dL Final  . Triglycerides 10/19/2014 62  0 - 149 mg/dL Final  . HDL 16/10/960405/05/2015 65  >39 mg/dL Final   Comment: According to ATP-III Guidelines, HDL-C >59 mg/dL is considered a negative risk factor for CHD.   Marland Kitchen. VLDL Cholesterol Cal 10/19/2014 12  5 - 40 mg/dL Final  . LDL Calculated 10/19/2014 75  0 - 99 mg/dL Final  . Chol/HDL Ratio 10/19/2014 2.3  0.0 - 4.4 ratio units Final   Comment:                                   T. Chol/HDL Ratio                                             Men  Women                               1/2 Avg.Risk  3.4    3.3                                   Avg.Risk  5.0    4.4                                2X Avg.Risk  9.6    7.1                                3X Avg.Risk 23.4   11.0     No results found.   Assessment/Plan   ICD-9-CM ICD-10-CM   1. Mixed Alzheimer's and vascular dementia 331.0 G30.9 QUEtiapine (SEROQUEL) 25 MG tablet   294.10 F01.50 EXELON 13.3 MG/24HR PT24   290.40 F02.80   2. Essential hypertension, benign 401.1 I10 MAXZIDE 75-50 MG per tablet  3. Hyperlipemia 272.4 E78.5 atorvastatin (LIPITOR) 20 MG tablet  4. Depression due to dementia 311 F32.9    294.10 F02.80   5. Tobacco use disorder 305.1 Z72.0     --recommend washing hair at least weekly. Family will try to get pt to agree to visit from barber  --smoking cessation discussed and highly urged  --resume namenda xr. Titration packet sample given.  --f/u in 1 month for CPE/MMSE. Will reassess memory/behavior at that time  Fish CampMonica S. Ancil Linseyarter, D. O., F. A. C. O. I.  [facility] [address] [place], [place] [address] [phone] Cell (Monday-Friday 8 AM - 5 PM) [phone] After 5 PM and follow prompts

## 2014-11-08 NOTE — Patient Instructions (Signed)
Start namenda xr titration packet. Will re-evaluate at next visit  Continue current medications as ordered  Follow up in 1 month for CPE

## 2014-11-10 ENCOUNTER — Telehealth: Payer: Self-pay

## 2014-11-10 NOTE — Telephone Encounter (Signed)
Received prior authorization request from [facility], called [employer] received form, filled out form and give to Dr. Montez Moritaarter to sign

## 2014-11-15 ENCOUNTER — Telehealth: Payer: Self-pay | Admitting: Internal Medicine

## 2014-11-15 NOTE — Telephone Encounter (Signed)
[facility] 75mg -50 tab.  Taken one daily # 90 11-15-2014 cdavis- Placed Faxed  authorization Pending Approval in Triage area..Apolinar Junescdavis

## 2014-12-15 ENCOUNTER — Ambulatory Visit (INDEPENDENT_AMBULATORY_CARE_PROVIDER_SITE_OTHER): Payer: Medicare PPO | Admitting: Internal Medicine

## 2014-12-15 ENCOUNTER — Encounter: Payer: Self-pay | Admitting: Internal Medicine

## 2014-12-15 VITALS — BP 110/88 | HR 82 | Temp 98.0°F | Resp 20 | Ht 66.0 in | Wt 151.6 lb

## 2014-12-15 DIAGNOSIS — F03918 Unspecified dementia, unspecified severity, with other behavioral disturbance: Secondary | ICD-10-CM

## 2014-12-15 DIAGNOSIS — F0150 Vascular dementia without behavioral disturbance: Secondary | ICD-10-CM

## 2014-12-15 DIAGNOSIS — Z23 Encounter for immunization: Secondary | ICD-10-CM

## 2014-12-15 DIAGNOSIS — E785 Hyperlipidemia, unspecified: Secondary | ICD-10-CM | POA: Diagnosis not present

## 2014-12-15 DIAGNOSIS — F329 Major depressive disorder, single episode, unspecified: Secondary | ICD-10-CM | POA: Diagnosis not present

## 2014-12-15 DIAGNOSIS — G309 Alzheimer's disease, unspecified: Secondary | ICD-10-CM

## 2014-12-15 DIAGNOSIS — F0391 Unspecified dementia with behavioral disturbance: Secondary | ICD-10-CM | POA: Diagnosis not present

## 2014-12-15 DIAGNOSIS — F0280 Dementia in other diseases classified elsewhere without behavioral disturbance: Secondary | ICD-10-CM

## 2014-12-15 DIAGNOSIS — Z0000 Encounter for general adult medical examination without abnormal findings: Secondary | ICD-10-CM | POA: Diagnosis not present

## 2014-12-15 DIAGNOSIS — F0393 Unspecified dementia, unspecified severity, with mood disturbance: Secondary | ICD-10-CM

## 2014-12-15 MED ORDER — QUETIAPINE FUMARATE 50 MG PO TABS
ORAL_TABLET | ORAL | Status: DC
Start: 2014-12-15 — End: 2015-02-16

## 2014-12-15 MED ORDER — MEMANTINE HCL ER 28 MG PO CP24: 28.0000 mg | ORAL_CAPSULE | Freq: Every day | ORAL | Status: DC

## 2014-12-15 NOTE — Progress Notes (Signed)
Patient ID: Carol [name], female   DOB: 12-26-1935, 79 y.o.   MRN: [id number] Subjective:     Carol [name] is a 79 y.o. female and is here for a comprehensive physical exam. The patient reports no problems. namenda worked well. She ran out earlier this week but did not call for Rx. Family states she remains aggressive and irritable. Poor hygiene an issue. She goes days without bathing or changing her clothes. She is still smoking cigarettes.  History   Past Medical History  Diagnosis Date  . Cataract   . Rickets, late effect   . Other and unspecified hyperlipidemia   . Anxiety state, unspecified   . Tobacco use disorder   . Depressive disorder, not elsewhere classified   . Unspecified essential hypertension   . Memory loss   . Lumbago   . Unspecified constipation   . Varicose veins of lower extremities    Past Surgical History  Procedure Laterality Date  . Cyst excision Bilateral     behind ears  . Mouth surgery  04/12/2014   Family History  Problem Relation Age of Onset  . Heart disease Father     Social History  . Marital Status: Married    Spouse Name: N/A  . Number of Children: N/A  . Years of Education: N/A   Occupational History  . Not on file.   Social History Main Topics  . Smoking status: Current Every Day Smoker  . Smokeless tobacco: Never Used     Comment: No more than three cig daily   . Alcohol Use: No  . Drug Use: No  . Sexual Activity: Not on file   Other Topics Concern  . Not on file   Social History Narrative   Health Maintenance  Topic Date Due  . ZOSTAVAX  10/31/1997  . COLONOSCOPY  03/11/2012  . PNA vac Low Risk Adult (2 of 2 - PCV13) 07/09/2012  . INFLUENZA VACCINE  01/08/2015  . TETANUS/TDAP  06/08/2024  . DEXA SCAN  Completed      Review of Systems   Review of Systems  Unable to perform ROS: dementia     Objective:   Filed Vitals:   12/15/14 1451  Height:  (1.676 m)  Weight: 151 lb 9.6 oz (68.765 kg)  down 5 lbs since November 08, 2014   Filed Vitals:   12/15/14 1451  BP: 110/88  Pulse: 82  Temp: 98 F (36.7 C)  Resp: 20        Physical Exam  Constitutional: She is well-developed, well-nourished, and in no distress.  HENT:  Head: Normocephalic and atraumatic.  Right Ear: External ear normal.  Left Ear: External ear normal.  Mouth/Throat: Oropharynx is clear and moist. No oropharyngeal exudate.  Eyes: Conjunctivae and EOM are normal. Pupils are equal, round, and reactive to light. No scleral icterus.  Neck: Normal range of motion. Neck supple. No tracheal deviation present. No thyromegaly present.  Cardiovascular: Normal rate, regular rhythm, normal heart sounds and intact distal pulses.  Exam reveals no gallop and no friction rub.   No murmur heard. Pulmonary/Chest: Effort normal and breath sounds normal. She has no wheezes. She has no rhonchi. She has no rales. She exhibits no tenderness. Right breast exhibits no inverted nipple, no mass, no nipple discharge, no skin change and no tenderness. Left breast exhibits no inverted nipple, no mass, no nipple discharge, no skin change and no tenderness. Breasts are symmetrical.  Abdominal: Soft. Bowel sounds are normal.  She exhibits no distension and no mass. There is no hepatosplenomegaly. There is no tenderness. There is no rebound and no guarding.  Genitourinary:  Deferred. Pt has no c/o and has aged out of paps  Musculoskeletal: Normal range of motion. She exhibits edema.  Lymphadenopathy:    She has no cervical adenopathy.  Neurological: She is alert. She has normal reflexes. Gait normal.  Skin: Skin is warm, dry and intact. No lesion and no rash noted.  Very dry skin but no breaks. Multiple spider veins. No varicose veins.  Psychiatric: Mood and affect normal. She exhibits abnormal recent memory.      MMSE - Mini Mental State Exam 12/15/2014 12/14/2013 03/01/2013  Orientation to time 1 3 3   Orientation to Place 4 5 5   Registration 3 3  3   Attention/ Calculation 5 5 5   Recall 2 0 0  Language- name 2 objects 2 2 2   Language- repeat 1 1 1   Language- follow 3 step command 3 3 3   Language- read & follow direction 1 1 1   Write a sentence 1 1 1   Copy design 1 1 1   Total score 24 25 25       Assessment:    Healthy female exam.       ICD-9-CM ICD-10-CM   1. Well adult exam V70.0 Z00.00   2. Mixed Alzheimer's and vascular dementia - she has worsened compared to 1 yr ago. Cont namenda er 331.0 G30.9 QUEtiapine (SEROQUEL) 50 MG tablet   294.10 F01.50    290.40 F02.80   3. Hyperlipidemia - stable 272.4 E78.5   4. Depression due to dementia 311 F32.9    294.10 F02.80   5. Dementia with behavioral disturbance 294.21 F03.91 memantine (NAMENDA XR) 28 MG CP24 24 hr capsule  6. Need for vaccination with 13-polyvalent pneumococcal conjugate vaccine V03.82 Z23 Pneumococcal conjugate vaccine 13-valent    Plan:     See After Visit Summary for Counseling Recommendations   --Pt is UTD on health maintenance. Vaccinations are not UTD. Prevnar given today. Pt maintains a healthy lifestyle. Encouraged pt to exercise 30-45 minutes 4-5 times per week. Eat a well balanced diet. Smoking cessation discussed and highly urged. Limit alcohol intake. Wear seatbelt when riding in the car. Wear sun block (SPF >50) when spending extended times outside.  --increase seroquel 50mg  qhs  --resume namenda er. Sample titration pack given. Maintenance dose Rx sent to pharmacy by eRx  --discussed importance of hygiene. Encouraged her to shower preferably daily but no less than 3 times a week.  --use moisturizing lotion to skin BID  --Follow up in 2 mos for routine visit  --She will get shingles vaccine and tetanus at local pharmacy. She already has Rx. If not done by next OV, family states they will have the vaccines administered during that visit, irrespective of the expense  [place]Monica S. Ancil Linseyarter, D. O., F. A. C. O. I.  [facility] [address] [place], [place] [phone] Cell (Monday-Friday 8 AM - 5 PM) [phone] After 5 PM and follow prompts

## 2014-12-15 NOTE — Progress Notes (Signed)
Passed clock test 

## 2014-12-15 NOTE — Patient Instructions (Signed)
Encouraged her to exercise 30-45 minutes 4-5 times per week. Eat a well balanced diet. Avoid smoking. Limit alcohol intake. Wear seatbelt when riding in the car. Wear sun block (SPF >50) when spending extended times outside.  Increase seroquel to   Resume namenda er. Sample titration packet given  Follow up in 2 mos for routine visit  She will get shingles vaccine and tetanus at local pharmacy  prevnar given today

## 2015-02-16 ENCOUNTER — Ambulatory Visit (INDEPENDENT_AMBULATORY_CARE_PROVIDER_SITE_OTHER): Payer: Medicare PPO | Admitting: Internal Medicine

## 2015-02-16 ENCOUNTER — Encounter: Payer: Self-pay | Admitting: Internal Medicine

## 2015-02-16 VITALS — BP 136/76 | HR 83 | Temp 98.3°F | Resp 20 | Ht 66.0 in | Wt 154.4 lb

## 2015-02-16 DIAGNOSIS — G309 Alzheimer's disease, unspecified: Secondary | ICD-10-CM | POA: Diagnosis not present

## 2015-02-16 DIAGNOSIS — F0280 Dementia in other diseases classified elsewhere without behavioral disturbance: Secondary | ICD-10-CM

## 2015-02-16 DIAGNOSIS — I10 Essential (primary) hypertension: Secondary | ICD-10-CM | POA: Diagnosis not present

## 2015-02-16 DIAGNOSIS — Z23 Encounter for immunization: Secondary | ICD-10-CM | POA: Diagnosis not present

## 2015-02-16 DIAGNOSIS — Z720 Tobacco use: Secondary | ICD-10-CM | POA: Diagnosis not present

## 2015-02-16 DIAGNOSIS — F0150 Vascular dementia without behavioral disturbance: Secondary | ICD-10-CM | POA: Diagnosis not present

## 2015-02-16 DIAGNOSIS — E785 Hyperlipidemia, unspecified: Secondary | ICD-10-CM | POA: Diagnosis not present

## 2015-02-16 DIAGNOSIS — F0393 Unspecified dementia, unspecified severity, with mood disturbance: Secondary | ICD-10-CM

## 2015-02-16 DIAGNOSIS — F329 Major depressive disorder, single episode, unspecified: Secondary | ICD-10-CM

## 2015-02-16 DIAGNOSIS — F17200 Nicotine dependence, unspecified, uncomplicated: Secondary | ICD-10-CM

## 2015-02-16 MED ORDER — QUETIAPINE FUMARATE 50 MG PO TABS: ORAL_TABLET | ORAL | Status: DC

## 2015-02-16 NOTE — Progress Notes (Signed)
Patient ID: Carol [name], female   DOB: 08-Dec-1935, 79 y.o.   MRN: [id number]    Location:    [facility]   Place of Service:  OFFICE   Chief Complaint  Patient presents with  . Medical Management of Chronic Issues    2 month follow-up  . Immunizations    will take flu shot    HPI:  79 yo female seen today for f/u. Daughter reports no change in pt's behavior. Hygiene poor and she has been scratching a lot. Not bathing. She does not comb her hair. She kept the same clothes on for 2 weeks. Pt is a poor historian due to dementia. Hx obtained from daughters. Daughters are really concerned. She is hoarding more and pretty soon, they will not be able to get into bedroom. Expired food in refrigerator. She is still driving and will be gone for hours at a time. She cannot recount where she had been. She is still taking 1/2 tab of seroquel qhs but is paranoid and agitated. She feels like people are stealing from her  She did not get Tdap and zostavax  Past Medical History  Diagnosis Date  . Cataract   . Rickets, late effect   . Other and unspecified hyperlipidemia   . Anxiety state, unspecified   . Tobacco use disorder   . Depressive disorder, not elsewhere classified   . Unspecified essential hypertension   . Memory loss   . Lumbago   . Unspecified constipation   . Varicose veins of lower extremities     Past Surgical History  Procedure Laterality Date  . Cyst excision Bilateral     behind ears  . Mouth surgery  04/12/2014    Patient Care Team: Kirt Boys, DO as PCP - General (Internal Medicine)  Social History   Social History  . Marital Status: Married    Spouse Name: N/A  . Number of Children: N/A  . Years of Education: N/A   Occupational History  . Not on file.   Social History Main Topics  . Smoking status: Current Every Day Smoker  . Smokeless tobacco: Never Used     Comment: No more than three cig daily   . Alcohol Use: No  . Drug Use: No  . Sexual  Activity: Not on file   Other Topics Concern  . Not on file   Social History Narrative     reports that she has been smoking.  She has never used smokeless tobacco. She reports that she does not drink alcohol or use illicit drugs.  Allergies  Allergen Reactions  . Penicillins     Rash & swelling    Medications: Patient's Medications  New Prescriptions   No medications on file  Previous Medications   ALENDRONATE (FOSAMAX) 70 MG TABLET    Take 1 tablet (70 mg total) by mouth every 7 (seven) days. Take with a full glass of water on an empty stomach.   ATORVASTATIN (LIPITOR) 20 MG TABLET    Take 1 tablet (20 mg total) by mouth daily.   CALCIUM CARBONATE-VITAMIN D (CALTRATE 600+D) 600-400 MG-UNIT PER TABLET    Take 1 tablet by mouth 2 (two) times a week.   DESLORATADINE (CLARINEX) 5 MG TABLET    Take 1 tablet (5 mg total) by mouth daily. Take 1 tablet daily as needed for allergies.   EXELON 13.3 MG/24HR PT24    PLACE 1 PATCH ON SKIN EVERY DAY, ROTATE SITES   MAXZIDE 75-50 MG PER  TABLET    TAKE 1 TABLET BY MOUTH ONE TIME DAILY   MEMANTINE (NAMENDA XR) 28 MG CP24 24 HR CAPSULE    Take 1 capsule (28 mg total) by mouth daily.   MULTIPLE VITAMINS-MINERALS (MULTIVITAMIN WITH MINERALS) TABLET    Take 1 tablet by mouth daily.   POLYETHYLENE GLYCOL POWDER (GLYCOLAX/MIRALAX) POWDER    Take 17 g by mouth daily.   QUETIAPINE (SEROQUEL) 50 MG TABLET    Take 1/2 tablet by mouth at bedtime   TDAP (BOOSTRIX) 5-2.5-18.5 LF-MCG/0.5 INJECTION    Inject 0.5 mLs into the muscle once.   ZOSTER VACCINE LIVE, PF, (ZOSTAVAX) 40981 UNT/0.65ML INJECTION    Inject 19,400 Units into the skin once.  Modified Medications   No medications on file  Discontinued Medications   No medications on file    Review of Systems  Unable to perform ROS: Dementia    Filed Vitals:   02/16/15 1559  BP: 136/76  Pulse: 83  Temp: 98.3 F (36.8 C)  TempSrc: Oral  Resp: 20  Height: 5\' 6"  (1.676 m)  Weight: 154 lb 6.4 oz  (70.035 kg)  SpO2: 98%   Body mass index is 24.93 kg/(m^2).  Physical Exam  Constitutional: She appears well-developed.  Frail appearing  HENT:  Mouth/Throat: Oropharynx is clear and moist. No oropharyngeal exudate.  Eyes: Pupils are equal, round, and reactive to light. No scleral icterus.  Neck: Neck supple. Carotid bruit is not present. No tracheal deviation present. No thyromegaly present.  Cardiovascular: Normal rate, regular rhythm, normal heart sounds and intact distal pulses.  Exam reveals no gallop and no friction rub.   No murmur heard. No LE edema b/l. no calf TTP.   Pulmonary/Chest: Effort normal and breath sounds normal. No stridor. No respiratory distress. She has no wheezes. She has no rales.  Abdominal: Soft. Bowel sounds are normal. She exhibits no distension and no mass. There is no hepatomegaly. There is no tenderness. There is no rebound and no guarding.  Lymphadenopathy:    She has no cervical adenopathy.  Neurological: She is alert.  Skin: Skin is warm and dry. No rash (but very dry) noted.  Adhesive from old exelon patches present on back in different areas. The patch themselves are absent except for newest one  Psychiatric: She is agitated. She exhibits a depressed mood.     Labs reviewed: No visits with results within 3 Month(s) from this visit. Latest known visit with results is:  Appointment on 10/19/2014  Component Date Value Ref Range Status  . Glucose 10/19/2014 99  65 - 99 mg/dL Final  . BUN 19/14/7829 10  8 - 27 mg/dL Final  . Creatinine, Ser 10/19/2014 0.80  0.57 - 1.00 mg/dL Final  . GFR calc non Af Amer 10/19/2014 72  >59 mL/min/1.73 Final  . GFR calc Af Amer 10/19/2014 83  >59 mL/min/1.73 Final  . BUN/Creatinine Ratio 10/19/2014 13  11 - 26 Final  . Sodium 10/19/2014 140  134 - 144 mmol/L Final  . Potassium 10/19/2014 4.3  3.5 - 5.2 mmol/L Final  . Chloride 10/19/2014 103  97 - 108 mmol/L Final  . CO2 10/19/2014 19  18 - 29 mmol/L Final  .  Calcium 10/19/2014 9.2  8.7 - 10.3 mg/dL Final  . Total Protein 10/19/2014 7.1  6.0 - 8.5 g/dL Final  . Albumin 56/21/3086 3.7  3.5 - 4.8 g/dL Final  . Globulin, Total 10/19/2014 3.4  1.5 - 4.5 g/dL Final  . Albumin/Globulin Ratio 10/19/2014  1.1  1.1 - 2.5 Final  . Bilirubin Total 10/19/2014 0.5  0.0 - 1.2 mg/dL Final  . Alkaline Phosphatase 10/19/2014 67  39 - 117 IU/L Final  . AST 10/19/2014 12  0 - 40 IU/L Final  . ALT 10/19/2014 7  0 - 32 IU/L Final  . Cholesterol, Total 10/19/2014 152  100 - 199 mg/dL Final  . Triglycerides 10/19/2014 62  0 - 149 mg/dL Final  . HDL 19/14/7829 65  >39 mg/dL Final   Comment: According to ATP-III Guidelines, HDL-C >59 mg/dL is considered a negative risk factor for CHD.   Marland Kitchen VLDL Cholesterol Cal 10/19/2014 12  5 - 40 mg/dL Final  . LDL Calculated 10/19/2014 75  0 - 99 mg/dL Final  . Chol/HDL Ratio 10/19/2014 2.3  0.0 - 4.4 ratio units Final   Comment:                                   T. Chol/HDL Ratio                                             Men  Women                               1/2 Avg.Risk  3.4    3.3                                   Avg.Risk  5.0    4.4                                2X Avg.Risk  9.6    7.1                                3X Avg.Risk 23.4   11.0     No results found.   Assessment/Plan   ICD-9-CM ICD-10-CM   1. 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 Need for prophylactic vaccination and inoculation against cholera alone V03.0 Z23 Flu Vaccine QUAD 36+ mos PF IM (Fluarix & Fluzone Quad PF)  3. Depression due to dementia 311 F32.9    294.10 F02.80   4. Essential hypertension, benign 401.1 I10   5. Hyperlipemia 272.4 E78.5   6. Tobacco use disorder 305.1 Z72.0     AVOID DRIVING due to worsening dementia  Increase seroquel to 1 tab at bedtime  Continue other medications as ordered  Follow up in 1 month  for dementia  Recommend Alzheimer support group. Given education materials  Cohoe S. Ancil Linsey  [facility] [address] [place], [place] [address] [phone] Cell (Monday-Friday 8 AM - 5 PM) [phone] After 5 PM and follow prompts

## 2015-02-16 NOTE — Patient Instructions (Addendum)
AVOID DRIVING due to worsening dementia  Increase seroquel to 1 tab at bedtime  Continue other medications as ordered  Follow up in 1 month for dementia  Recommend Alzheimer support group. Education materials supplied

## 2015-03-19 ENCOUNTER — Other Ambulatory Visit: Payer: Self-pay | Admitting: Nurse Practitioner

## 2015-03-23 ENCOUNTER — Ambulatory Visit: Payer: Medicare PPO | Admitting: Internal Medicine

## 2015-04-23 DIAGNOSIS — Z961 Presence of intraocular lens: Secondary | ICD-10-CM | POA: Diagnosis not present

## 2015-04-27 ENCOUNTER — Ambulatory Visit (INDEPENDENT_AMBULATORY_CARE_PROVIDER_SITE_OTHER): Payer: Medicare PPO | Admitting: Internal Medicine

## 2015-04-27 ENCOUNTER — Encounter: Payer: Self-pay | Admitting: Internal Medicine

## 2015-04-27 VITALS — BP 132/98 | HR 67 | Temp 98.6°F | Resp 20 | Ht 66.0 in | Wt 154.2 lb

## 2015-04-27 DIAGNOSIS — F17200 Nicotine dependence, unspecified, uncomplicated: Secondary | ICD-10-CM

## 2015-04-27 DIAGNOSIS — F0280 Dementia in other diseases classified elsewhere without behavioral disturbance: Secondary | ICD-10-CM | POA: Diagnosis not present

## 2015-04-27 DIAGNOSIS — F329 Major depressive disorder, single episode, unspecified: Secondary | ICD-10-CM | POA: Diagnosis not present

## 2015-04-27 DIAGNOSIS — I10 Essential (primary) hypertension: Secondary | ICD-10-CM

## 2015-04-27 DIAGNOSIS — F0150 Vascular dementia without behavioral disturbance: Secondary | ICD-10-CM | POA: Diagnosis not present

## 2015-04-27 DIAGNOSIS — F0393 Unspecified dementia, unspecified severity, with mood disturbance: Secondary | ICD-10-CM

## 2015-04-27 DIAGNOSIS — G309 Alzheimer's disease, unspecified: Secondary | ICD-10-CM

## 2015-04-27 MED ORDER — MEMANTINE HCL-DONEPEZIL HCL ER 28-10 MG PO CP24: 1.0000 | ORAL_CAPSULE | Freq: Every day | ORAL | Status: DC

## 2015-04-27 NOTE — Progress Notes (Signed)
Patient ID: Carol [name], female   DOB: 04-Jul-1935, 79 y.o.   MRN: [id number]    Location:    [facility]    Place of Service:   OFFICE  Chief Complaint  Patient presents with  . Medical Management of Chronic Issues    1 month follow-up for dementia    HPI:  79 yo female seen today for f/u dementia. seroquel increased at last OV. She is still not bathing and is more noticeable by others. Daughters are really concerned. She is taking 1 tab of seroquel qhs. Family believes she is not taking maxide daily as daughter has not had to fill it through the pharmacy in 2 mos. Pt continues to smoke  Past Medical History  Diagnosis Date  . Cataract   . Rickets, late effect   . Other and unspecified hyperlipidemia   . Anxiety state, unspecified   . Tobacco use disorder   . Depressive disorder, not elsewhere classified   . Unspecified essential hypertension   . Memory loss   . Lumbago   . Unspecified constipation   . Varicose veins of lower extremities     Past Surgical History  Procedure Laterality Date  . Cyst excision Bilateral     behind ears  . Mouth surgery  04/12/2014    Patient Care Team: Kirt BoysMonica Aleisa Howk, DO as PCP - General (Internal Medicine)  Social History   Social History  . Marital Status: Married    Spouse Name: N/A  . Number of Children: N/A  . Years of Education: N/A   Occupational History  . Not on file.   Social History Main Topics  . Smoking status: Current Every Day Smoker  . Smokeless tobacco: Never Used     Comment: No more than three cig daily   . Alcohol Use: No  . Drug Use: No  . Sexual Activity: Not on file   Other Topics Concern  . Not on file   Social History Narrative     reports that she has been smoking.  She has never used smokeless tobacco. She reports that she does not drink alcohol or use illicit drugs.  Allergies  Allergen Reactions  . Penicillins     Rash & swelling    Medications: Patient's Medications  New Prescriptions    No medications on file  Previous Medications   ALENDRONATE (FOSAMAX) 70 MG TABLET    Take 1 tablet (70 mg total) by mouth every 7 (seven) days. Take with a full glass of water on an empty stomach.   ATORVASTATIN (LIPITOR) 20 MG TABLET    Take 1 tablet (20 mg total) by mouth daily.   CALCIUM CARBONATE-VITAMIN D (CALTRATE 600+D) 600-400 MG-UNIT PER TABLET    Take 1 tablet by mouth 2 (two) times a week.   DESLORATADINE (CLARINEX) 5 MG TABLET    Take 1 tablet (5 mg total) by mouth daily. Take 1 tablet daily as needed for allergies.   EXELON 13.3 MG/24HR PT24    PLACE 1 PATCH ON SKIN EVERY DAY, ROTATE SITES   MAXZIDE 75-50 MG PER TABLET    TAKE 1 TABLET BY MOUTH ONE TIME DAILY   MEMANTINE (NAMENDA XR) 28 MG CP24 24 HR CAPSULE    Take 1 capsule (28 mg total) by mouth daily.   MULTIPLE VITAMINS-MINERALS (MULTIVITAMIN WITH MINERALS) TABLET    Take 1 tablet by mouth daily.   POLYETHYLENE GLYCOL POWDER (GLYCOLAX/MIRALAX) POWDER    Take 17 g by mouth daily.   QUETIAPINE (  SEROQUEL) 50 MG TABLET    Take 1 tablet by mouth at bedtime   ZOSTER VACCINE LIVE, PF, (ZOSTAVAX) [address] UNT/0.65ML INJECTION    Inject 19,400 Units into the skin once.  Modified Medications   No medications on file  Discontinued Medications   EXELON 13.3 MG/24HR PT24    PLACE 1 PATCH ON SKIN EVERY DAY, ROTATE SITES   TDAP (BOOSTRIX) 5-2.5-18.5 LF-MCG/0.5 INJECTION    Inject 0.5 mLs into the muscle once.    Review of Systems  Unable to perform ROS: Dementia    Filed Vitals:   04/27/15 1528  BP: 132/98  Pulse: 67  Temp: 98.6 F (37 C)  TempSrc: Oral  Resp: 20  Height:  (1.676 m)  Weight: 154 lb 3.2 oz (69.945 kg)  SpO2: 97%   Body mass index is 24.9 kg/(m^2).  Physical Exam  Constitutional: She appears well-developed and well-nourished.  Disheveled appearance  HENT:  Mouth/Throat: Oropharynx is clear and moist. No oropharyngeal exudate.  Eyes: Pupils are equal, round, and reactive to light. No scleral icterus.    Neck: Neck supple. Carotid bruit is not present. No tracheal deviation present. No thyromegaly present.  Cardiovascular: Normal rate, regular rhythm, normal heart sounds and intact distal pulses.  Exam reveals no gallop and no friction rub.   No murmur heard. No LE edema b/l. no calf TTP.   Pulmonary/Chest: Effort normal and breath sounds normal. No stridor. No respiratory distress. She has no wheezes. She has no rales.  Abdominal: There is no hepatomegaly.  Lymphadenopathy:    She has no cervical adenopathy.  Neurological: She is alert.  Skin: Skin is warm and dry. No rash noted.  Psychiatric: She has a normal mood and affect. Her behavior is normal.     Labs reviewed: No visits with results within 3 Month(s) from this visit. Latest known visit with results is:  Appointment on 10/19/2014  Component Date Value Ref Range Status  . Glucose 10/19/2014 99  65 - 99 mg/dL Final  . BUN 60/45/4098 10  8 - 27 mg/dL Final  . Creatinine, Ser 10/19/2014 0.80  0.57 - 1.00 mg/dL Final  . GFR calc non Af Amer 10/19/2014 72  >59 mL/min/1.73 Final  . GFR calc Af Amer 10/19/2014 83  >59 mL/min/1.73 Final  . BUN/Creatinine Ratio 10/19/2014 13  11 - 26 Final  . Sodium 10/19/2014 140  134 - 144 mmol/L Final  . Potassium 10/19/2014 4.3  3.5 - 5.2 mmol/L Final  . Chloride 10/19/2014 103  97 - 108 mmol/L Final  . CO2 10/19/2014 19  18 - 29 mmol/L Final  . Calcium 10/19/2014 9.2  8.7 - 10.3 mg/dL Final  . Total Protein 10/19/2014 7.1  6.0 - 8.5 g/dL Final  . Albumin 11/91/4782 3.7  3.5 - 4.8 g/dL Final  . Globulin, Total 10/19/2014 3.4  1.5 - 4.5 g/dL Final  . Albumin/Globulin Ratio 10/19/2014 1.1  1.1 - 2.5 Final  . Bilirubin Total 10/19/2014 0.5  0.0 - 1.2 mg/dL Final  . Alkaline Phosphatase 10/19/2014 67  39 - 117 IU/L Final  . AST 10/19/2014 12  0 - 40 IU/L Final  . ALT 10/19/2014 7  0 - 32 IU/L Final  . Cholesterol, Total 10/19/2014 152  100 - 199 mg/dL Final  . Triglycerides 10/19/2014 62  0 -  149 mg/dL Final  . HDL 95/62/1308 65  >39 mg/dL Final   Comment: According to ATP-III Guidelines, HDL-C >59 mg/dL is considered a negative risk factor  for CHD.   Marland Kitchen VLDL Cholesterol Cal 10/19/2014 12  5 - 40 mg/dL Final  . LDL Calculated 10/19/2014 75  0 - 99 mg/dL Final  . Chol/HDL Ratio 10/19/2014 2.3  0.0 - 4.4 ratio units Final   Comment:                                   T. Chol/HDL Ratio                                             Men  Women                               1/2 Avg.Risk  3.4    3.3                                   Avg.Risk  5.0    4.4                                2X Avg.Risk  9.6    7.1                                3X Avg.Risk 23.4   11.0     No results found.   Assessment/Plan   ICD-9-CM ICD-10-CM   1. Mixed Alzheimer's and vascular dementia - failing to change as expected 331.0 G30.9    294.10 F01.50    290.40 F02.80   2. Depression due to dementia - stable 311 F32.9    294.10 F02.80   3       Tobacco use d/o - continuous 4.       HTN - suboptimally controlled due to medication noncompliance  STOP NAMENDA XR  START namzaric 1 capsule daily for dementia  Continue other medications as ordered  Smoking cessation discussed and highly urged  Follow up in 1 month for routine visit  Elianah Karis S. Ancil Linsey  [facility] [address] [place], [place] [address] [phone] Cell (Monday-Friday 8 AM - 5 PM) [phone] After 5 PM and follow prompts

## 2015-04-27 NOTE — Patient Instructions (Signed)
STOP NAMENDA XR  START namzaric 1 capsule daily for dementia  Continue other medications as ordered  Follow up in 1 month for routine visit

## 2015-05-17 ENCOUNTER — Inpatient Hospital Stay (HOSPITAL_COMMUNITY)
Admission: EM | Admit: 2015-05-17 | Discharge: 2015-05-20 | DRG: 056 | Disposition: A | Discharge status: Home / Self Care | Payer: Medicare PPO | Attending: Internal Medicine | Admitting: Internal Medicine

## 2015-05-17 ENCOUNTER — Encounter (HOSPITAL_COMMUNITY): Payer: Self-pay | Admitting: Emergency Medicine

## 2015-05-17 ENCOUNTER — Emergency Department (HOSPITAL_COMMUNITY): Payer: Medicare PPO

## 2015-05-17 DIAGNOSIS — M810 Age-related osteoporosis without current pathological fracture: Secondary | ICD-10-CM | POA: Diagnosis present

## 2015-05-17 DIAGNOSIS — B9689 Other specified bacterial agents as the cause of diseases classified elsewhere: Secondary | ICD-10-CM | POA: Diagnosis present

## 2015-05-17 DIAGNOSIS — Z8249 Family history of ischemic heart disease and other diseases of the circulatory system: Secondary | ICD-10-CM | POA: Diagnosis not present

## 2015-05-17 DIAGNOSIS — F03918 Unspecified dementia, unspecified severity, with other behavioral disturbance: Secondary | ICD-10-CM | POA: Diagnosis present

## 2015-05-17 DIAGNOSIS — T502X5A Adverse effect of carbonic-anhydrase inhibitors, benzothiadiazides and other diuretics, initial encounter: Secondary | ICD-10-CM | POA: Diagnosis not present

## 2015-05-17 DIAGNOSIS — N179 Acute kidney failure, unspecified: Secondary | ICD-10-CM | POA: Diagnosis present

## 2015-05-17 DIAGNOSIS — F17210 Nicotine dependence, cigarettes, uncomplicated: Secondary | ICD-10-CM | POA: Diagnosis present

## 2015-05-17 DIAGNOSIS — G9340 Encephalopathy, unspecified: Secondary | ICD-10-CM | POA: Diagnosis not present

## 2015-05-17 DIAGNOSIS — F0281 Dementia in other diseases classified elsewhere with behavioral disturbance: Secondary | ICD-10-CM | POA: Diagnosis present

## 2015-05-17 DIAGNOSIS — F0391 Unspecified dementia with behavioral disturbance: Secondary | ICD-10-CM | POA: Diagnosis not present

## 2015-05-17 DIAGNOSIS — A419 Sepsis, unspecified organism: Secondary | ICD-10-CM | POA: Diagnosis present

## 2015-05-17 DIAGNOSIS — R410 Disorientation, unspecified: Secondary | ICD-10-CM | POA: Diagnosis not present

## 2015-05-17 DIAGNOSIS — R4182 Altered mental status, unspecified: Secondary | ICD-10-CM | POA: Diagnosis present

## 2015-05-17 DIAGNOSIS — M8580 Other specified disorders of bone density and structure, unspecified site: Secondary | ICD-10-CM | POA: Diagnosis present

## 2015-05-17 DIAGNOSIS — R630 Anorexia: Secondary | ICD-10-CM | POA: Diagnosis not present

## 2015-05-17 DIAGNOSIS — E876 Hypokalemia: Secondary | ICD-10-CM | POA: Diagnosis not present

## 2015-05-17 DIAGNOSIS — R269 Unspecified abnormalities of gait and mobility: Secondary | ICD-10-CM | POA: Diagnosis not present

## 2015-05-17 DIAGNOSIS — I10 Essential (primary) hypertension: Secondary | ICD-10-CM | POA: Diagnosis present

## 2015-05-17 DIAGNOSIS — R5383 Other fatigue: Secondary | ICD-10-CM | POA: Diagnosis not present

## 2015-05-17 DIAGNOSIS — G309 Alzheimer's disease, unspecified: Principal | ICD-10-CM | POA: Diagnosis present

## 2015-05-17 DIAGNOSIS — N390 Urinary tract infection, site not specified: Secondary | ICD-10-CM | POA: Diagnosis present

## 2015-05-17 DIAGNOSIS — E860 Dehydration: Secondary | ICD-10-CM | POA: Diagnosis present

## 2015-05-17 DIAGNOSIS — R8271 Bacteriuria: Secondary | ICD-10-CM | POA: Diagnosis present

## 2015-05-17 LAB — CBC
HEMATOCRIT: 46 % (ref 36.0–46.0)
HEMOGLOBIN: 15.5 g/dL — AB (ref 12.0–15.0)
MCH: 30.1 pg (ref 26.0–34.0)
MCHC: 33.7 g/dL (ref 30.0–36.0)
MCV: 89.3 fL (ref 78.0–100.0)
Platelets: 165 10*3/uL (ref 150–400)
RBC: 5.15 MIL/uL — ABNORMAL HIGH (ref 3.87–5.11)
RDW: 13.6 % (ref 11.5–15.5)
WBC: 9.3 10*3/uL (ref 4.0–10.5)

## 2015-05-17 LAB — BASIC METABOLIC PANEL
ANION GAP: 12 (ref 5–15)
BUN: 39 mg/dL — AB (ref 6–20)
CALCIUM: 9.8 mg/dL (ref 8.9–10.3)
CO2: 23 mmol/L (ref 22–32)
Chloride: 102 mmol/L (ref 101–111)
Creatinine, Ser: 1.98 mg/dL — ABNORMAL HIGH (ref 0.44–1.00)
GFR calc Af Amer: 27 mL/min — ABNORMAL LOW (ref >60)
GFR calc non Af Amer: 23 mL/min — ABNORMAL LOW (ref >60)
GLUCOSE: 164 mg/dL — AB (ref 65–99)
Potassium: 3.1 mmol/L — ABNORMAL LOW (ref 3.5–5.1)
Sodium: 137 mmol/L (ref 135–145)

## 2015-05-17 LAB — URINALYSIS, ROUTINE W REFLEX MICROSCOPIC
Glucose, UA: NEGATIVE mg/dL
Ketones, ur: NEGATIVE mg/dL
Nitrite: NEGATIVE
PH: 5 (ref 5.0–8.0)
Protein, ur: NEGATIVE mg/dL
SPECIFIC GRAVITY, URINE: 1.015 (ref 1.005–1.030)

## 2015-05-17 LAB — URINE MICROSCOPIC-ADD ON

## 2015-05-17 LAB — I-STAT CG4 LACTIC ACID, ED: LACTIC ACID, VENOUS: 1.78 mmol/L (ref 0.5–2.0)

## 2015-05-17 MED ORDER — POTASSIUM CHLORIDE 10 MEQ/100ML IV SOLN
10.0000 meq | Freq: Once | INTRAVENOUS | Status: AC
  Administered 2015-05-17: 10 meq via INTRAVENOUS
  Filled 2015-05-17: qty 100

## 2015-05-17 MED ORDER — CEFTRIAXONE SODIUM 1 G IJ SOLR
1.0000 g | Freq: Once | INTRAMUSCULAR | Status: AC
  Administered 2015-05-17: 1 g via INTRAVENOUS
  Filled 2015-05-17: qty 10

## 2015-05-17 MED ORDER — POTASSIUM CHLORIDE CRYS ER 20 MEQ PO TBCR: 40.0000 meq | EXTENDED_RELEASE_TABLET | Freq: Once | ORAL | Status: DC

## 2015-05-17 NOTE — ED Notes (Signed)
Pt was bought in by daughters that are concerned because the last 3 days pt has had decreased appetite and been just laying around the house which is not normal for her. Family states last night they noticed that her speech was slower and sounded slurred. Pt is alert and ox3. Pt has no facial droop. Equal grip strengths and denies any numbness or weakness. pts gait seemed unsteady walking into triage. Daughters state that her unsteady gait started yesterday. Pt has no complainants.

## 2015-05-17 NOTE — H&P (Signed)
History and Physical  Patient Name: Carol [name]     ZOX:[id number]    DOB: 02/07/1936    DOA: 05/17/2015 Referring physician: Olive Bass, PA-C PCP: Kirt Boys, DO      Chief Complaint: Sluggish and slurred speech  HPI: Carol [name] is a 79 y.o. female with a past medical history significant for dementia with behavioral disturbance, smoking, and HTN who presents with acute change in mental status.  History collected from daughter, as patient is unreliable historian.  She notes that the patient has been "laying about" for 2-3 days, more sluggish than usual, and last night seemed to be slurring her speech and today unsteady on her feet, so family brought her to the ER.  No fever, no respiratory symptoms.  Patient denies urinary symptoms.  Had donepezil added, but this as three weeks ago.  In the ED, the patient was hemodynamically stable but had new AKI and urinalysis showing evidence of bacteriuria.  She was started on ceftriaxone and TRH were asked to admit for presumed UTI.     Review of Systems:  Pt complains of sluggishness, slurred speech, weakness, confusion more than usual. Pt denies any fever, chills, cough, sputum, dysuria, urgency, hematuria, abdominal pain, urinary frequency, focal weakness, passing out.  All other systems negative except as just noted or noted in the history of present illness.   Allergies:  Allergies  Allergen Reactions  . Penicillins     Rash & swelling    Home medications: .  alendronate (FOSAMAX) 70 MG tablet, Take 1 tablet (70 mg total) by mouth every 7 (seven) days. Take with a full glass of water on an empty stomach., Disp: 4 tablet, Rfl: 11 .  atorvastatin (LIPITOR) 20 MG tablet, Take 1 tablet (20 mg total) by mouth daily., Disp: 30 tablet, Rfl: 3 .  Calcium Carbonate-Vitamin D (CALTRATE 600+D) 600-400 MG-UNIT per tablet, Take 1 tablet by mouth 2 (two) times a week., Disp: , Rfl:  .  desloratadine (CLARINEX) 5 MG tablet,  Take 1 tablet (5 mg total) by mouth daily. Take 1 tablet daily as needed for allergies., Disp: 30 tablet, Rfl: 5 .  Memantine HCl-Donepezil HCl (NAMZARIC) 28-10 MG CP24, Take 1 capsule by mouth daily., Disp: 30 capsule, Rfl: 6 .  Multiple Vitamins-Minerals (MULTIVITAMIN WITH MINERALS) tablet, Take 1 tablet by mouth daily., Disp: , Rfl:  .  polyethylene glycol powder (GLYCOLAX/MIRALAX) powder, Take 17 g by mouth daily. (Patient taking differently: Take 17 g by mouth daily as needed for mild constipation. ), Disp: 3350 g, Rfl: 1 .  QUEtiapine (SEROQUEL) 50 MG tablet, Take 1 tablet by mouth at bedtime, Disp: 30 tablet, Rfl: 6 .  Rivastigmine (EXELON) 13.3 MG/24HR PT24, Place 1 patch onto the skin daily. Rotate sites, Disp: , Rfl:  .  triamterene-hydrochlorothiazide (MAXZIDE) 75-50 MG tablet, Take 1 tablet by mouth daily., Disp: , Rfl:  .  zoster vaccine live, PF, (ZOSTAVAX) 81191 UNT/0.65ML injection, Inject 19,400 Units into the skin once. (Patient not taking: Reported on 04/27/2015), Disp: 1 each, Rfl: 0   Past medical history: 1. Smoking 2. HTN 3. Dementia with behavioral disturbance 4. Osteoporosis/osteopenia   Past surgical history: 1. Mouth surgery  Family history:  Father, heart disease.  Social History:  Patient lives with her husband.  She still drives, per daughter, and is independent with all ADLs.  Still shops and able to cook a little.  Walks without assistive device.        Physical Exam: BP 111/63  mmHg  Pulse 63  Temp(Src) 98.4 F (36.9 C) (Oral)  Resp 16  Ht 5\' 7"  (1.702 m)  Wt 62.143 kg (137 lb)  BMI 21.45 kg/m2  SpO2 100% General appearance: Well-developed, elderly adult female, alert and in no acute distress.   Eyes: Anicteric, conjunctiva pink, lids and lashes normal.     ENT: No nasal deformity, discharge, or epistaxis.  OP moist without lesions.   Lymph: No cervical lymphadenopathy. Skin: Warm and dry.  Cardiac: RRR, nl S1-S2, no murmurs appreciated.     Respiratory: Normal respiratory rate and rhythm.  CTAB. Abdomen: Abdomen soft without rigidity.  No TTP. No ascites, distension.   MSK: No deformities or effusions. Neuro: Pupils are 3 mm and reactive to 2 mm.  Extraocular movements are intact, without nystagmus.    Cranial nerve 5 is within normal limits.  Cranial nerve 7 is symmetrical.  Cranial nerve 8 is within normal limits.  Cranial nerves 9 and 10 reveal equal palate elevation.  Cranial nerve 11 reveals sternocleidomastoid strong.  Cranial nerve 12 is midline.  Motor strength testing is 5/5 in the upper and lower extremities bilaterally with normal motor, tone and bulk.   Sensory examination is intact to light touch and position.  The gait is slow, but steady.  Romberg maneuver is negative for pathology.  Finger-to-nose testing is within normal limits.  The patient is oriented to time, place and person.  Speech is slow but not slurred.  Naming is grossly intact.  Recall, recent and remote, as well as general fund of knowledge seem mildly impaired.  Attention span and concentration are within normal limits.    Psych: Behavior appropriate.  Affect normal.  No evidence of aural or visual hallucinations or delusions.       Labs on Admission:  The metabolic panel shows hypokalemia, elevated creatinine. The urinalysis shows no protein, RBCs. There is bacteria and WBCs. The lactic acid level is normal. The complete blood count shows no leukocytosis, anemia, or thrombocytopenia.   Radiological Exams on Admission: Personally reviewed: Ct Head Wo Contrast  05/17/2015  CLINICAL DATA:  Altered mental status. EXAM: CT HEAD WITHOUT CONTRAST TECHNIQUE: Contiguous axial images were obtained from the base of the skull through the vertex without intravenous contrast. COMPARISON:  03/10/2013 FINDINGS: There is no evidence of intracranial hemorrhage, brain edema, or other signs of acute infarction. There is no evidence of intracranial mass lesion or  mass effect. No abnormal extraaxial fluid collections are identified. Mild diffuse cerebral atrophy noted. Ventricles stable in size. Persistent cavum septum pellucidum and cavum vergae incidentally noted. No skull abnormality identified. Increased mucosal thickening seen involving right maxillary sinus, without evidence of air-fluid level. IMPRESSION: No acute intracranial findings.  Stable mild cerebral atrophy. Chronic right maxillary sinusitis incidentally noted. Electronically Signed   By: Myles RosenthalJohn  Stahl M.D.   On: 05/17/2015 21:18        Assessment/Plan 1. UTI:  Possible UTI.  No symptoms.    -Ceftriaxone 1 g daily -Follow urine culture. -PT eval   2. Altered mental status:  This is new.  Donepezil added 3 weeks ago, but not clearly related.  Suspect this is related to #1 above.  CVA doubted.  3. AKI:  This is new.  Suspect related to #1 above and pre-renal.  UA bland other than bacteria. -Fluid resuscitation and trend BMP  4. Hypokalemia:  -Check magnesium -Supplement orally once and K containing fluids overnight, cautious given AKI  5. Dementia with behavioral disturbance:  Stable.  -  Continue rivastigmine patch, memantine, donepezil, and quetiapine  6. HTN:  Normotensive at admission. -Continue Maxzide     DVT PPx: Lovenox Diet: Regular Consultants: PT Code Status: Full Family Communication: Daughter and son-in-law, present at bedside.  CODE STATUS confirmed.  Treatment plan for tonight reviewed, and questions answered.  Medical decision making: What exists of the patient's previous chart was reviewed in depth and the case was discussed with Olive Bass, PA-C. Patient seen 11:03 PM on 05/17/2015.  Disposition Plan:  Admit for IV antibitics for UTI and fluids for dehydration and AKI.  Trend BMP.  PT eval and anticipate admission for 2-3 days.      Alberteen Sam [facility] Pager [phone]

## 2015-05-17 NOTE — ED Provider Notes (Signed)
CSN: [id number]     Arrival date & time 05/17/15  1640 History   First MD Initiated Contact with Patient 05/17/15 1901     Chief Complaint  Patient presents with  . Aphasia    HPI   Carol [name] is a 79 y.o. female with a PMH of HLD, HTN, dementia who presents to the ED with altered mental status and fatigue for the past 3 days. Her daughters are present at bedside, and state the patient has had a decreased appetite and has not been acting like her normal self. They also reports slurred speech. The patient states she has no complaints, and denies fever, chills, headache, dizziness, lightheadedness, vision changes, chest pain, shortness of breath, abdominal pain, N/V/D/C, dysuria, urgency, frequency, numbness, weakness, paresthesia.   Past Medical History  Diagnosis Date  . Cataract   . Rickets, late effect   . Other and unspecified hyperlipidemia   . Anxiety state, unspecified   . Tobacco use disorder   . Depressive disorder, not elsewhere classified   . Unspecified essential hypertension   . Memory loss   . Lumbago   . Unspecified constipation   . Varicose veins of lower extremities    Past Surgical History  Procedure Laterality Date  . Cyst excision Bilateral     behind ears  . Mouth surgery  04/12/2014   Family History  Problem Relation Age of Onset  . Heart disease Father    Social History  Substance Use Topics  . Smoking status: Current Every Day Smoker  . Smokeless tobacco: Never Used     Comment: No more than three cig daily   . Alcohol Use: No   OB History    No data available      Review of Systems  Constitutional: Positive for activity change, appetite change and fatigue. Negative for fever and chills.  Eyes: Negative for visual disturbance.  Respiratory: Negative for shortness of breath.   Cardiovascular: Negative for chest pain.  Gastrointestinal: Negative for nausea, vomiting, diarrhea, constipation and abdominal distention.  Genitourinary:  Negative for dysuria, urgency and frequency.  Neurological: Positive for speech difficulty. Negative for dizziness, syncope, weakness, light-headedness, numbness and headaches.  All other systems reviewed and are negative.     Allergies  Penicillins  Home Medications   Prior to Admission medications   Medication Sig Start Date End Date Taking? Authorizing Provider  alendronate (FOSAMAX) 70 MG tablet Take 1 tablet (70 mg total) by mouth every 7 (seven) days. Take with a full glass of water on an empty stomach. 01/24/14  Yes Mahima Pandey, MD  atorvastatin (LIPITOR) 20 MG tablet Take 1 tablet (20 mg total) by mouth daily. 11/08/14  Yes Kirt Boys, DO  Calcium Carbonate-Vitamin D (CALTRATE 600+D) 600-400 MG-UNIT per tablet Take 1 tablet by mouth 2 (two) times a week.   Yes Historical Provider, MD  desloratadine (CLARINEX) 5 MG tablet Take 1 tablet (5 mg total) by mouth daily. Take 1 tablet daily as needed for allergies. 06/08/14  Yes Sharon Seller, NP  Memantine HCl-Donepezil HCl (NAMZARIC) 28-10 MG CP24 Take 1 capsule by mouth daily. 04/27/15  Yes Kirt Boys, DO  Multiple Vitamins-Minerals (MULTIVITAMIN WITH MINERALS) tablet Take 1 tablet by mouth daily.   Yes Historical Provider, MD  polyethylene glycol powder (GLYCOLAX/MIRALAX) powder Take 17 g by mouth daily. Patient taking differently: Take 17 g by mouth daily as needed for mild constipation.  03/01/13  Yes Mahima Pandey, MD  QUEtiapine (SEROQUEL) 50 MG tablet  Take 1 tablet by mouth at bedtime 02/16/15  Yes [name], DO  Rivastigmine (EXELON) 13.3 MG/24HR PT24 Place 1 patch onto the skin daily. Rotate sites   Yes Historical Provider, MD  triamterene-hydrochlorothiazide (MAXZIDE) 75-50 MG tablet Take 1 tablet by mouth daily.   Yes Historical Provider, MD  zoster vaccine live, PF, (ZOSTAVAX) [id number] UNT/0.65ML injection Inject 19,400 Units into the skin once. Patient not taking: Reported on 04/27/2015 07/25/14   Mahima Glade LloydPandey, MD     BP 151/117 mmHg  Pulse 72  Temp(Src) 98.4 F (36.9 C) (Oral)  Resp 16  Ht 5\' 7"  (1.702 m)  Wt 62.143 kg  BMI 21.45 kg/m2  SpO2 99% Physical Exam  Constitutional: She is oriented to person, place, and time. She appears well-developed and well-nourished. No distress.  HENT:  Head: Normocephalic and atraumatic.  Right Ear: External ear normal.  Left Ear: External ear normal.  Nose: Nose normal.  Mouth/Throat: Uvula is midline, oropharynx is clear and moist and mucous membranes are normal.  Eyes: Conjunctivae, EOM and lids are normal. Pupils are equal, round, and reactive to light. Right eye exhibits no discharge. Left eye exhibits no discharge. No scleral icterus.  Neck: Normal range of motion. Neck supple.  Cardiovascular: Normal rate, regular rhythm, normal heart sounds, intact distal pulses and normal pulses.   Pulmonary/Chest: Effort normal and breath sounds normal. No respiratory distress. She has no wheezes. She has no rales.  Abdominal: Soft. Normal appearance and bowel sounds are normal. She exhibits no distension and no mass. There is no tenderness. There is no rigidity, no rebound and no guarding.  Musculoskeletal: Normal range of motion. She exhibits no edema or tenderness.  Neurological: She is alert and oriented to person, place, and time. She has normal strength. No cranial nerve deficit or sensory deficit.  Speech fluent. No facial asymmetry.  Skin: Skin is warm, dry and intact. No rash noted. She is not diaphoretic. No erythema. No pallor.  Psychiatric: She has a normal mood and affect. Her speech is normal and behavior is normal.  Nursing note and vitals reviewed.   ED Course  Procedures (including critical care time)  Labs Review Labs Reviewed  BASIC METABOLIC PANEL - Abnormal; Notable for the following:    Potassium 3.1 (*)    Glucose, Bld 164 (*)    BUN 39 (*)    Creatinine, Ser 1.98 (*)    GFR calc non Af Amer 23 (*)    GFR calc Af Amer 27 (*)    All  other components within normal limits  CBC - Abnormal; Notable for the following:    RBC 5.15 (*)    Hemoglobin 15.5 (*)    All other components within normal limits  URINALYSIS, ROUTINE W REFLEX MICROSCOPIC (NOT AT [facility]) - Abnormal; Notable for the following:    APPearance CLOUDY (*)    Hgb urine dipstick TRACE (*)    Bilirubin Urine SMALL (*)    Leukocytes, UA LARGE (*)    All other components within normal limits  URINE MICROSCOPIC-ADD ON - Abnormal; Notable for the following:    Squamous Epithelial / LPF 6-30 (*)    Bacteria, UA MANY (*)    All other components within normal limits  URINE CULTURE  CULTURE, BLOOD (ROUTINE X 2)  CULTURE, BLOOD (ROUTINE X 2)  I-STAT CG4 LACTIC ACID, ED    Imaging Review Ct Head Wo Contrast  05/17/2015  CLINICAL DATA:  Altered mental status. EXAM: CT HEAD WITHOUT CONTRAST TECHNIQUE:  Contiguous axial images were obtained from the base of the skull through the vertex without intravenous contrast. COMPARISON:  03/10/2013 FINDINGS: There is no evidence of intracranial hemorrhage, brain edema, or other signs of acute infarction. There is no evidence of intracranial mass lesion or mass effect. No abnormal extraaxial fluid collections are identified. Mild diffuse cerebral atrophy noted. Ventricles stable in size. Persistent cavum septum pellucidum and cavum vergae incidentally noted. No skull abnormality identified. Increased mucosal thickening seen involving right maxillary sinus, without evidence of air-fluid level. IMPRESSION: No acute intracranial findings.  Stable mild cerebral atrophy. Chronic right maxillary sinusitis incidentally noted. Electronically Signed   By: Myles Rosenthal M.D.   On: 05/17/2015 21:18     I have personally reviewed and evaluated these images and lab results as part of my medical decision-making.   EKG Interpretation None      MDM   Final diagnoses:  Altered mental status  Acute UTI    79 year old female presents for altered  mental status, fatigue, decreased appetite, and intermittent slurred/slowed speech x 3 days. She denies complaints at this time.  Patient is afebrile. Vital signs stable. Alert and oriented x 3. Heart regular rate and rhythm. Lungs clear to auscultation bilaterally. Abdomen soft, nontender, nondistended. Normal neuro exam with no focal deficit. Strength and sensation intact.  CBC negative for leukocytosis or anemia. BMP remarkable for potassium 3.1, creatinine 1.98. UA remarkable for large leukocytes; 6-30 WBC, many bacteria on microscopic.  Will give rocephin for UTI.  CT head pending. CT negative for acute intracranial findings.  Patient to be admitted given AMS in the setting of UTI. Hospitalist consulted for admission. Spoke with Dr. Maryfrances Bunnell, who will admit the patient for further evaluation and management. Advised to order lactic acid and urine and blood cultures. Patient discussed with Dr. Rhunette Croft.  BP 151/117 mmHg  Pulse 72  Temp(Src) 98.4 F (36.9 C) (Oral)  Resp 16  Ht  (1.702 m)  Wt 62.143 kg  BMI 21.45 kg/m2  SpO2 99%     Mady Gemma, PA-C 05/17/15 2313  Derwood Kaplan, MD 05/18/15 [phone]

## 2015-05-18 ENCOUNTER — Ambulatory Visit: Payer: Medicare PPO | Admitting: Internal Medicine

## 2015-05-18 DIAGNOSIS — N179 Acute kidney failure, unspecified: Secondary | ICD-10-CM

## 2015-05-18 DIAGNOSIS — N10 Acute tubulo-interstitial nephritis: Secondary | ICD-10-CM

## 2015-05-18 DIAGNOSIS — I10 Essential (primary) hypertension: Secondary | ICD-10-CM

## 2015-05-18 DIAGNOSIS — R410 Disorientation, unspecified: Secondary | ICD-10-CM

## 2015-05-18 DIAGNOSIS — F0391 Unspecified dementia with behavioral disturbance: Secondary | ICD-10-CM

## 2015-05-18 LAB — CBC
HEMATOCRIT: 40.4 % (ref 36.0–46.0)
HEMOGLOBIN: 13.1 g/dL (ref 12.0–15.0)
MCH: 29.2 pg (ref 26.0–34.0)
MCHC: 32.4 g/dL (ref 30.0–36.0)
MCV: 90 fL (ref 78.0–100.0)
Platelets: 132 10*3/uL — ABNORMAL LOW (ref 150–400)
RBC: 4.49 MIL/uL (ref 3.87–5.11)
RDW: 13.8 % (ref 11.5–15.5)
WBC: 8.5 10*3/uL (ref 4.0–10.5)

## 2015-05-18 LAB — BASIC METABOLIC PANEL
ANION GAP: 8 (ref 5–15)
BUN: 31 mg/dL — ABNORMAL HIGH (ref 6–20)
CALCIUM: 8.5 mg/dL — AB (ref 8.9–10.3)
CO2: 23 mmol/L (ref 22–32)
Chloride: 108 mmol/L (ref 101–111)
Creatinine, Ser: 1.68 mg/dL — ABNORMAL HIGH (ref 0.44–1.00)
GFR calc non Af Amer: 28 mL/min — ABNORMAL LOW (ref >60)
GFR, EST AFRICAN AMERICAN: 33 mL/min — AB (ref >60)
GLUCOSE: 74 mg/dL (ref 65–99)
POTASSIUM: 3.4 mmol/L — AB (ref 3.5–5.1)
Sodium: 139 mmol/L (ref 135–145)

## 2015-05-18 LAB — MAGNESIUM: MAGNESIUM: 1.6 mg/dL — AB (ref 1.7–2.4)

## 2015-05-18 MED ORDER — MAGNESIUM SULFATE 50 % IJ SOLN
3.0000 g | Freq: Once | INTRAVENOUS | Status: AC
  Administered 2015-05-18: 3 g via INTRAVENOUS
  Filled 2015-05-18: qty 6

## 2015-05-18 MED ORDER — POTASSIUM CHLORIDE CRYS ER 20 MEQ PO TBCR
20.0000 meq | EXTENDED_RELEASE_TABLET | Freq: Once | ORAL | Status: AC
  Administered 2015-05-18: 20 meq via ORAL
  Filled 2015-05-18: qty 1

## 2015-05-18 MED ORDER — DEXTROSE 5 % IV SOLN
1.0000 g | INTRAVENOUS | Status: DC
  Administered 2015-05-18 – 2015-05-19 (×2): 1 g via INTRAVENOUS
  Filled 2015-05-18 (×3): qty 10

## 2015-05-18 MED ORDER — POLYETHYLENE GLYCOL 3350 17 G PO PACK: 17.0000 g | PACK | Freq: Every day | ORAL | Status: DC | PRN

## 2015-05-18 MED ORDER — POTASSIUM CHLORIDE IN NACL 20-0.9 MEQ/L-% IV SOLN
INTRAVENOUS | Status: DC
  Administered 2015-05-18 (×2): via INTRAVENOUS
  Filled 2015-05-18 (×6): qty 1000

## 2015-05-18 MED ORDER — MEMANTINE HCL ER 28 MG PO CP24
28.0000 mg | ORAL_CAPSULE | Freq: Every day | ORAL | Status: DC
  Administered 2015-05-18 – 2015-05-20 (×3): 28 mg via ORAL
  Filled 2015-05-18 (×3): qty 1

## 2015-05-18 MED ORDER — QUETIAPINE FUMARATE 25 MG PO TABS
50.0000 mg | ORAL_TABLET | Freq: Every day | ORAL | Status: DC
  Administered 2015-05-18 – 2015-05-19 (×3): 50 mg via ORAL
  Filled 2015-05-18 (×3): qty 2

## 2015-05-18 MED ORDER — MEMANTINE HCL-DONEPEZIL HCL ER 28-10 MG PO CP24: 1.0000 | ORAL_CAPSULE | Freq: Every day | ORAL | Status: DC

## 2015-05-18 MED ORDER — TRIAMTERENE-HCTZ 75-50 MG PO TABS
1.0000 | ORAL_TABLET | Freq: Every day | ORAL | Status: DC
  Administered 2015-05-18: 1 via ORAL
  Filled 2015-05-18: qty 1

## 2015-05-18 MED ORDER — ATORVASTATIN CALCIUM 20 MG PO TABS
20.0000 mg | ORAL_TABLET | Freq: Every day | ORAL | Status: DC
  Administered 2015-05-18 – 2015-05-20 (×3): 20 mg via ORAL
  Filled 2015-05-18 (×3): qty 1

## 2015-05-18 MED ORDER — ACETAMINOPHEN 650 MG RE SUPP
650.0000 mg | Freq: Four times a day (QID) | RECTAL | Status: DC | PRN
Start: 2015-05-18 — End: 2015-05-20

## 2015-05-18 MED ORDER — MAGNESIUM SULFATE 50 % IJ SOLN: 3.0000 g | Freq: Once | INTRAVENOUS | Status: DC

## 2015-05-18 MED ORDER — SODIUM CHLORIDE 0.9 % IV BOLUS (SEPSIS)
1000.0000 mL | Freq: Once | INTRAVENOUS | Status: AC
  Administered 2015-05-18: 1000 mL via INTRAVENOUS

## 2015-05-18 MED ORDER — RIVASTIGMINE 13.3 MG/24HR TD PT24
1.0000 | MEDICATED_PATCH | Freq: Every day | TRANSDERMAL | Status: DC
  Administered 2015-05-18 – 2015-05-20 (×3): 13.3 mg via TRANSDERMAL
  Filled 2015-05-18 (×3): qty 1

## 2015-05-18 MED ORDER — ACETAMINOPHEN 325 MG PO TABS
650.0000 mg | ORAL_TABLET | Freq: Four times a day (QID) | ORAL | Status: DC | PRN
Start: 2015-05-18 — End: 2015-05-20

## 2015-05-18 MED ORDER — DONEPEZIL HCL 10 MG PO TABS
10.0000 mg | ORAL_TABLET | Freq: Every day | ORAL | Status: DC
  Administered 2015-05-18 – 2015-05-19 (×3): 10 mg via ORAL
  Filled 2015-05-18 (×3): qty 1

## 2015-05-18 MED ORDER — ENOXAPARIN SODIUM 30 MG/0.3ML ~~LOC~~ SOLN
30.0000 mg | Freq: Every day | SUBCUTANEOUS | Status: DC
  Administered 2015-05-18 – 2015-05-20 (×3): 30 mg via SUBCUTANEOUS
  Filled 2015-05-18 (×3): qty 0.3

## 2015-05-18 NOTE — Progress Notes (Signed)
[facility]                                                                              Patient Demographics  Carol [name], is a 79 y.o. female, DOB - 01-Jan-1936, ZOX:[id number]  Admit date - 05/17/2015   Admitting Physician Alberteen Sam, MD  Outpatient Primary MD for the patient is Kirt Boys, DO  LOS - 1   Chief Complaint  Patient presents with  . Aphasia       Brief HPI   Carol [name] is a 79 y.o. female with a past medical history significant for dementia with behavioral disturbance, smoking, and HTN who presented with acute change in mental status. Per patient's daughter at the time of admission reported that the patient had been "laying about" for 2-3 days, more sluggish than usual, and last night seemed to be slurring her speech and on the day of admission and unsteady on her feet, so family brought her to the ER. No fever, no respiratory symptoms. Patient denies urinary symptoms. Had donepezil added, but this as three weeks ago. In the ED, the patient was hemodynamically stable but had new AKI and urinalysis showing evidence of bacteriuria. She was started on ceftriaxone and TRH were asked to admit for presumed UTI.   Assessment & Plan    Principal Problem: Acute encephalopathy in the setting of underlying dementia, now worsened with UTI, acute kidney injury -CT head negative, stable mild cerebral atrophy, no acute infarct - Continue IV Rocephin, PT evaluation, if no significant improvement, will obtain an MRI to rule out any stroke   Urinary tract infection Follow urine culture and sensitivities, continue IV Rocephin   Acute kidney injury Possibly due to UTI, continue gentle hydration, improving Hold her triamterene HCTZ  Hypokalemia:  Likely due to HCTZ, replaced  Hypomagnesemia Replaced IV  Dementia with behavioral disturbance:  Stable.  -Continue rivastigmine patch, memantine, donepezil, and  quetiapine  HTN:  Normotensive at admission. Hold Maxzide Place on hydralazine as needed with parameters   Code Status: Full CODE STATUS  Family Communication: No family member at the bedside  Disposition Plan:   Time Spent in minutes 25 minutes  Procedures  CT head  Consults   None   DVT Prophylaxis  Lovenox   Medications  Scheduled Meds: . atorvastatin  20 mg Oral Daily  . cefTRIAXone (ROCEPHIN)  IV  1 g Intravenous Q24H  . memantine  28 mg Oral Daily   And  . donepezil  10 mg Oral QHS  . enoxaparin (LOVENOX) injection  30 mg Subcutaneous Daily  . QUEtiapine  50 mg Oral QHS  . Rivastigmine  1 patch Transdermal Daily  . triamterene-hydrochlorothiazide  1 tablet Oral Daily   Continuous Infusions: . 0.9 % NaCl with KCl 20 mEq / L 75 mL/hr at 05/18/15 0307   PRN Meds:.acetaminophen **OR** acetaminophen, polyethylene glycol   Antibiotics   Anti-infectives    Start     Dose/Rate Route Frequency Ordered Stop   05/18/15 1800  cefTRIAXone (ROCEPHIN) 1 g in dextrose 5 % 50 mL IVPB     1 g 100 mL/hr over 30  Minutes Intravenous Every 24 hours 05/18/15 0123     05/17/15 1945  cefTRIAXone (ROCEPHIN) 1 g in dextrose 5 % 50 mL IVPB     1 g 100 mL/hr over 30 Minutes Intravenous  Once 05/17/15 1940 05/17/15 2036        Subjective:   Carol [name] was seen and examined today. Still somewhat confused but no slurring of speech noted. Oriented to self. Patient denies dizziness, chest pain, shortness of breath, abdominal pain, N/V/D/C, new weakness, numbess, tingling. No acute events overnight.    Objective:   Blood pressure 128/72, pulse 70, temperature 98.2 F (36.8 C), temperature source Oral, resp. rate 18, height  (1.702 m), weight 62.143 kg (137 lb), SpO2 99 %.  Wt Readings from Last 3 Encounters:  05/17/15 62.143 kg (137 lb)  04/27/15 69.945 kg (154 lb 3.2 oz)  02/16/15 70.035 kg (154 lb 6.4 oz)     Intake/Output Summary (Last 24 hours) at  05/18/15 1227 Last data filed at 05/18/15 1043  Gross per 24 hour  Intake   1760 ml  Output    400 ml  Net   1360 ml    Exam  General: Alert and oriented x self, NAD  HEENT:  PERRLA, EOMI, Anicteric Sclera, mucous membranes moist.   Neck: Supple, no JVD, no masses  CVS: S1 S2 auscultated, no rubs, murmurs or gallops. Regular rate and rhythm.  Respiratory: Clear to auscultation bilaterally, no wheezing, rales or rhonchi  Abdomen: Soft, nontender, nondistended, + bowel sounds  Ext: no cyanosis clubbing or edema  Neuro: AAOxself, Cr N's II- XII. Strength 5/5 upper and lower extremities bilaterally  Skin: No rashes  Psych: confused alert and oriented xself   Data Review   Micro Results Recent Results (from the past 240 hour(s))  Blood culture (routine x 2)     Status: None (Preliminary result)   Collection Time: 05/17/15 10:15 PM  Result Value Ref Range Status   Specimen Description BLOOD RIGHT HAND  Final   Special Requests BOTTLES DRAWN AEROBIC ONLY 6CC  Final   Culture NO GROWTH < 24 HOURS  Final   Report Status PENDING  Incomplete  Blood culture (routine x 2)     Status: None (Preliminary result)   Collection Time: 05/17/15 10:20 PM  Result Value Ref Range Status   Specimen Description BLOOD LEFT HAND  Final   Special Requests BOTTLES DRAWN AEROBIC ONLY 10CC  Final   Culture NO GROWTH < 24 HOURS  Final   Report Status PENDING  Incomplete    Radiology Reports Ct Head Wo Contrast  05/17/2015  CLINICAL DATA:  Altered mental status. EXAM: CT HEAD WITHOUT CONTRAST TECHNIQUE: Contiguous axial images were obtained from the base of the skull through the vertex without intravenous contrast. COMPARISON:  03/10/2013 FINDINGS: There is no evidence of intracranial hemorrhage, brain edema, or other signs of acute infarction. There is no evidence of intracranial mass lesion or mass effect. No abnormal extraaxial fluid collections are identified. Mild diffuse cerebral atrophy  noted. Ventricles stable in size. Persistent cavum septum pellucidum and cavum vergae incidentally noted. No skull abnormality identified. Increased mucosal thickening seen involving right maxillary sinus, without evidence of air-fluid level. IMPRESSION: No acute intracranial findings.  Stable mild cerebral atrophy. Chronic right maxillary sinusitis incidentally noted. Electronically Signed   By: Myles Rosenthal M.D.   On: 05/17/2015 21:18    CBC  Recent Labs Lab 05/17/15 1743 05/18/15 0543  WBC 9.3 8.5  HGB 15.5* 13.1  HCT 46.0 40.4  PLT 165 132*  MCV 89.3 90.0  MCH 30.1 29.2  MCHC 33.7 32.4  RDW 13.6 13.8    Chemistries   Recent Labs Lab 05/17/15 1743 05/18/15 0543  NA 137 139  K 3.1* 3.4*  CL 102 108  CO2 23 23  GLUCOSE 164* 74  BUN 39* 31*  CREATININE 1.98* 1.68*  CALCIUM 9.8 8.5*  MG  --  1.6*   ------------------------------------------------------------------------------------------------------------------ estimated creatinine clearance is 27.3 mL/min (by C-G formula based on Cr of 1.68). ------------------------------------------------------------------------------------------------------------------ No results for input(s): HGBA1C in the last 72 hours. ------------------------------------------------------------------------------------------------------------------ No results for input(s): CHOL, HDL, LDLCALC, TRIG, CHOLHDL, LDLDIRECT in the last 72 hours. ------------------------------------------------------------------------------------------------------------------ No results for input(s): TSH, T4TOTAL, T3FREE, THYROIDAB in the last 72 hours.  Invalid input(s): FREET3 ------------------------------------------------------------------------------------------------------------------ No results for input(s): VITAMINB12, FOLATE, FERRITIN, TIBC, IRON, RETICCTPCT in the last 72 hours.  Coagulation profile No results for input(s): INR, PROTIME in the last 168  hours.  No results for input(s): DDIMER in the last 72 hours.  Cardiac Enzymes No results for input(s): CKMB, TROPONINI, MYOGLOBIN in the last 168 hours.  Invalid input(s): CK ------------------------------------------------------------------------------------------------------------------ Invalid input(s): POCBNP  No results for input(s): GLUCAP in the last 72 hours.   RAI,RIPUDEEP M.D. [facility] 05/18/2015, 12:27 PM  Pager: [phone] Between 7am to 7pm - call Pager - [phone]  After 7pm go to [email or web address] - password [email or web address]  Call night coverage person covering after 7pm

## 2015-05-18 NOTE — Progress Notes (Signed)
ANTIBIOTIC CONSULT NOTE - INITIAL  Pharmacy Consult for Ceftriaxone  Indication: UTI  Allergies  Allergen Reactions  . Penicillins     Rash & swelling    Patient Measurements: Height: 5\' 7"  (170.2 cm) Weight: 137 lb (62.143 kg) IBW/kg (Calculated) : 61.6  Vital Signs: Temp: 98.4 F (36.9 C) (12/09 0121) Temp Source: Oral (12/09 0121) BP: 149/94 mmHg (12/09 0121) Pulse Rate: 72 (12/09 0121)  Labs:  Recent Labs  05/17/15 1743  WBC 9.3  HGB 15.5*  PLT 165  CREATININE 1.98*   Estimated Creatinine Clearance: 23.1 mL/min (by C-G formula based on Cr of 1.98).  Medical History: Past Medical History  Diagnosis Date  . Cataract   . Rickets, late effect   . Other and unspecified hyperlipidemia   . Anxiety state, unspecified   . Tobacco use disorder   . Depressive disorder, not elsewhere classified   . Unspecified essential hypertension   . Memory loss   . Lumbago   . Unspecified constipation   . Varicose veins of lower extremities     Assessment: Ceftriaxone for possible UTI  Plan:  -Ceftriaxone 1g IV q24h -F/U urine culture  Abran DukeLedford, Emilee Market 05/18/2015,1:23 AM

## 2015-05-18 NOTE — Evaluation (Signed)
Physical Therapy Evaluation Patient Details Name: Carol [name] MRN: [id number] DOB: [birth year] Today's Date: 05/18/2015   History of Present Illness  79 yo female with onset of UTI and AMS, with history dementia, CT head negative.    Clinical Impression  Pt was able to get up to walk with minor help but does present a higher fall risk.  Pt has increased fall risk with her new change from UTI with strength but will benefit from HHPT to follow up.  May need AD but is not willing to use one upon first meeting with PT.    Follow Up Recommendations Home health PT;Supervision/Assistance - 24 hour    Equipment Recommendations  None recommended by PT (Pt will need encouragement to use AD)    Recommendations for Other Services       Precautions / Restrictions Precautions Precautions: Fall Restrictions Weight Bearing Restrictions: No      Mobility  Bed Mobility Overal bed mobility: Modified Independent                Transfers Overall transfer level: Needs assistance Equipment used: 1 person hand held assist Transfers: Sit to/from Stand;Stand Pivot Transfers Sit to Stand: Min guard Stand pivot transfers: Min guard          Ambulation/Gait Ambulation/Gait assistance: Min guard Ambulation Distance (Feet): 200 Feet Assistive device: 1 person hand held assist Gait Pattern/deviations: Step-through pattern;Drifts right/left;Narrow base of support Gait velocity: reduced Gait velocity interpretation: Below normal speed for age/gender General Gait Details: disorganized approach to gait in the hall, not following instructions and  needed coaching to find her room and to sit in chair  Stairs            Wheelchair Mobility    Modified Rankin (Stroke Patients Only)       Balance Overall balance assessment: Needs assistance Sitting-balance support: Feet supported Sitting balance-Leahy Scale: Good     Standing balance support: Single extremity  supported Standing balance-Leahy Scale: Fair                               Pertinent Vitals/Pain Pain Assessment: No/denies pain    Home Living Family/patient expects to be discharged to:: Private residence Living Arrangements: Children;Spouse/significant other Available Help at Discharge: Family;Available 24 hours/day Type of Home: House Home Access: Other (comment) (Pt unable to describe)     Home Layout: One level Home Equipment: None      Prior Function Level of Independence: Independent               Hand Dominance        Extremity/Trunk Assessment   Upper Extremity Assessment: Overall WFL for tasks assessed           Lower Extremity Assessment: Generalized weakness      Cervical / Trunk Assessment: Normal  Communication   Communication: No difficulties;Other (comment) (mild confusion)  Cognition Arousal/Alertness: Awake/alert Behavior During Therapy: WFL for tasks assessed/performed Overall Cognitive Status: History of cognitive impairments - at baseline       Memory: Decreased short-term memory              General Comments General comments (skin integrity, edema, etc.): Pt presents as an increased fall risk but is willing to walk with PT.  Her plan is to go home with HHPT and will need potentially an AD but refuses to use one.  Pt is not understanding how this could be useful.  Exercises        Assessment/Plan    PT Assessment Patient needs continued PT services  PT Diagnosis Abnormality of gait   PT Problem List Decreased strength;Decreased range of motion;Decreased activity tolerance;Decreased balance;Decreased mobility;Decreased coordination;Decreased cognition;Decreased knowledge of use of DME;Decreased safety awareness;Decreased knowledge of precautions  PT Treatment Interventions DME instruction;Gait training;Functional mobility training;Therapeutic activities;Therapeutic exercise;Balance training;Neuromuscular  re-education;Patient/family education   PT Goals (Current goals can be found in the Care Plan section) Acute Rehab PT Goals Patient Stated Goal: to go home PT Goal Formulation: With patient Time For Goal Achievement: 06/01/15 Potential to Achieve Goals: Good    Frequency Min 2X/week   Barriers to discharge Other (comment) (increased fall risk and will benefit from follow up therapy)      Co-evaluation               End of Session Equipment Utilized During Treatment: Gait belt Activity Tolerance: Patient tolerated treatment well Patient left: in chair;with call bell/phone within reach;with chair alarm set;with family/visitor present (arrived as PT was leaving) Nurse Communication: Mobility status         Time: [id number] PT Time Calculation (min) (ACUTE ONLY): 35 min   Charges:   PT Evaluation $Initial PT Evaluation Tier I: 1 Procedure PT Treatments $Gait Training: 8-22 mins   PT G CodesIvar Drape:        Eyanna Mcgonagle E 05/18/2015, 1:22 PM   Samul Dadauth Myrian Botello, PT MS Acute Rehab Dept. Number: [facility] [id number] and [facility] [phone]

## 2015-05-18 NOTE — Care Management Note (Signed)
Case Management Note  Patient Details  Name: Carol [name] MRN: [id number] Date of Birth: Feb 01, 1936  Subjective/Objective:     Date:  05/18/15 Spoke with patient at the bedside with daughter [name] .  Introduced self as Sports coachcase manager and explained role in discharge planning and how to be reached. Verified patient lives in town, alone with spouse. Expressed potential need for  Rolling walker.  Verified patient anticipates to go home with spouse at time of discharge and will have part-time supervision by family at this time to best of their knowledge.  Patient denied needing help with their medication.  Patient  is driven by  Daughters to MD appointments.  Verified patient has PCP [name].  [facility], [facility]  Soc will begin 24-48 post dc.    Plan: CM will continue to follow for discharge planning and [facility].                Action/Plan:   Expected Discharge Date:                  Expected Discharge Plan:  Home w Home Health Services  In-House Referral:     Discharge planning Services  CM Consult  Post Acute Care Choice:  Home Health Choice offered to:     DME Arranged:    DME Agency:     HH Arranged:  PT, Nurse's Aide HH Agency:  [facility]  Status of Service:  Completed, signed off  Medicare Important Message Given:    Date Medicare IM Given:    Medicare IM give by:    Date Additional Medicare IM Given:    Additional Medicare Important Message give by:     If discussed at Long Length of Stay Meetings, dates discussed:    Additional Comments:  Leone Havenaylor, Isayah Ignasiak Clinton, RN 05/18/2015, 3:01 PM

## 2015-05-18 NOTE — Progress Notes (Signed)
Pt was admitted into the unit at 0122 per stretcher accompanied by NT and her two duaghters, came in fully alert but did not no why she is here, pt and family oriented to the unit and equipment, pt and family has been advise about valuable items and home medications, pt has been made comfortable in bed, call light and telephone within reach, bed alarm turn on and prescribed treatment started, will continue to monitor pt

## 2015-05-19 LAB — BASIC METABOLIC PANEL
Anion gap: 8 (ref 5–15)
BUN: 30 mg/dL — ABNORMAL HIGH (ref 6–20)
CHLORIDE: 109 mmol/L (ref 101–111)
CO2: 22 mmol/L (ref 22–32)
CREATININE: 1.49 mg/dL — AB (ref 0.44–1.00)
Calcium: 8.6 mg/dL — ABNORMAL LOW (ref 8.9–10.3)
GFR, EST AFRICAN AMERICAN: 38 mL/min — AB (ref >60)
GFR, EST NON AFRICAN AMERICAN: 33 mL/min — AB (ref >60)
Glucose, Bld: 94 mg/dL (ref 65–99)
Potassium: 4.3 mmol/L (ref 3.5–5.1)
SODIUM: 139 mmol/L (ref 135–145)

## 2015-05-19 LAB — URINE CULTURE

## 2015-05-19 NOTE — Progress Notes (Signed)
[facility]                                                                              Patient Demographics  Carol [name], is a 79 y.o. female, DOB - December 02, 1935, NFA:[id number]  Admit date - 05/17/2015   Admitting Physician Alberteen Sam, MD  Outpatient Primary MD for the patient is Kirt Boys, DO  LOS - 2   Chief Complaint  Patient presents with  . Aphasia       Brief HPI   Carol [name] is a 79 y.o. female with a past medical history significant for dementia with behavioral disturbance, smoking, and HTN who presented with acute change in mental status. Per patient's daughter at the time of admission reported that the patient had been "laying about" for 2-3 days, more sluggish than usual, and last night seemed to be slurring her speech and on the day of admission and unsteady on her feet, so family brought her to the ER. No fever, no respiratory symptoms. Patient denies urinary symptoms. Had donepezil added, but this as three weeks ago. In the ED, the patient was hemodynamically stable but had new AKI and urinalysis showing evidence of bacteriuria. She was started on ceftriaxone and TRH were asked to admit for presumed UTI.   Assessment & Plan    Principal Problem: Acute encephalopathy in the setting of underlying dementia, now worsened with UTI, acute kidney injury- improving -CT head negative, stable mild cerebral atrophy, no acute infarct - Continue IV Rocephin -  PT evaluation recommended home health PT, OT   Urinary tract infection Continue Rocephin    Acute kidney injury Creatinine 1.9 at the time of admission, improving Possibly due to UTI, continue gentle hydration, improving Discontinued triamterene HCTZ  Hypokalemia:  Likely due to HCTZ, replaced Discontinued triamterene/HCTZ  Hypomagnesemia Replaced IV  Dementia with behavioral disturbance:  Stable.  -Continue rivastigmine patch, memantine, donepezil,  and quetiapine  HTN:  Normotensive at admission. Hold Maxzide Place on hydralazine as needed with parameters   Code Status: Full CODE STATUS  Family Communication: Discussed in detail with patient's daughter   Disposition Plan: creatinine function improving, DC home in a.m.   Time Spent in minutes 25 minutes  Procedures  CT head  Consults   None   DVT Prophylaxis  Lovenox   Medications  Scheduled Meds: . atorvastatin  20 mg Oral Daily  . cefTRIAXone (ROCEPHIN)  IV  1 g Intravenous Q24H  . memantine  28 mg Oral Daily   And  . donepezil  10 mg Oral QHS  . enoxaparin (LOVENOX) injection  30 mg Subcutaneous Daily  . QUEtiapine  50 mg Oral QHS  . Rivastigmine  1 patch Transdermal Daily   Continuous Infusions: . 0.9 % NaCl with KCl 20 mEq / L 75 mL/hr at 05/18/15 1650   PRN Meds:.acetaminophen **OR** acetaminophen, polyethylene glycol   Antibiotics   Anti-infectives    Start     Dose/Rate Route Frequency Ordered Stop   05/18/15 1800  cefTRIAXone (ROCEPHIN) 1 g in dextrose 5 % 50 mL IVPB     1 g 100 mL/hr over 30 Minutes  Intravenous Every 24 hours 05/18/15 0123     05/17/15 1945  cefTRIAXone (ROCEPHIN) 1 g in dextrose 5 % 50 mL IVPB     1 g 100 mL/hr over 30 Minutes Intravenous  Once 05/17/15 1940 05/17/15 2036        Subjective:   Carol [name] was seen and examined today. Confusion improving, feels a lot better, no pain, headache or shortness of breath. No fevers or chills, no slurring of speech.  Patient denies dizziness, chest pain, shortness of breath, abdominal pain, N/V/D/C, new weakness, numbess, tingling. No acute events overnight.    Objective:   Blood pressure 125/62, pulse 67, temperature 98.3 F (36.8 C), temperature source Oral, resp. rate 19, height 5\' 7"  (1.702 m), weight 62.143 kg (137 lb), SpO2 99 %.  Wt Readings from Last 3 Encounters:  05/17/15 62.143 kg (137 lb)  04/27/15 69.945 kg (154 lb 3.2 oz)  02/16/15 70.035 kg (154 lb  6.4 oz)     Intake/Output Summary (Last 24 hours) at 05/19/15 1126 Last data filed at 05/19/15 0551  Gross per 24 hour  Intake   1346 ml  Output    850 ml  Net    496 ml    Exam  General: Alert and oriented x2, NAD  HEENT:  PERRLA, EOMI, Anicteric Sclera, mucous membranes moist.   Neck: Supple, no JVD, no masses  CVS: S1 S2 auscultated, no rubs, murmurs or gallops. Regular rate and rhythm.  Respiratory: Clear to auscultation bilaterally, no wheezing, rales or rhonchi  Abdomen: Soft, nontender, nondistended, + bowel sounds  Ext: no cyanosis clubbing or edema  Neuro: AAOxself, Cr N's II- XII. Strength 5/5 upper and lower extremities bilaterally  Skin: No rashes  Psych: normal affect and demeanor, alert and oriented 2   Data Review   Micro Results Recent Results (from the past 240 hour(s))  Urine culture     Status: None   Collection Time: 05/17/15  5:28 PM  Result Value Ref Range Status   Specimen Description URINE, CLEAN CATCH  Final   Special Requests NONE  Final   Culture MULTIPLE SPECIES PRESENT, SUGGEST RECOLLECTION  Final   Report Status 05/19/2015 FINAL  Final  Blood culture (routine x 2)     Status: None (Preliminary result)   Collection Time: 05/17/15 10:15 PM  Result Value Ref Range Status   Specimen Description BLOOD RIGHT HAND  Final   Special Requests BOTTLES DRAWN AEROBIC ONLY 6CC  Final   Culture NO GROWTH < 24 HOURS  Final   Report Status PENDING  Incomplete  Blood culture (routine x 2)     Status: None (Preliminary result)   Collection Time: 05/17/15 10:20 PM  Result Value Ref Range Status   Specimen Description BLOOD LEFT HAND  Final   Special Requests BOTTLES DRAWN AEROBIC ONLY 10CC  Final   Culture NO GROWTH < 24 HOURS  Final   Report Status PENDING  Incomplete    Radiology Reports Ct Head Wo Contrast  05/17/2015  CLINICAL DATA:  Altered mental status. EXAM: CT HEAD WITHOUT CONTRAST TECHNIQUE: Contiguous axial images were obtained  from the base of the skull through the vertex without intravenous contrast. COMPARISON:  03/10/2013 FINDINGS: There is no evidence of intracranial hemorrhage, brain edema, or other signs of acute infarction. There is no evidence of intracranial mass lesion or mass effect. No abnormal extraaxial fluid collections are identified. Mild diffuse cerebral atrophy noted. Ventricles stable in size. Persistent cavum septum pellucidum and cavum vergae  incidentally noted. No skull abnormality identified. Increased mucosal thickening seen involving right maxillary sinus, without evidence of air-fluid level. IMPRESSION: No acute intracranial findings.  Stable mild cerebral atrophy. Chronic right maxillary sinusitis incidentally noted. Electronically Signed   By: Myles Rosenthal M.D.   On: 05/17/2015 21:18    CBC  Recent Labs Lab 05/17/15 1743 05/18/15 0543  WBC 9.3 8.5  HGB 15.5* 13.1  HCT 46.0 40.4  PLT 165 132*  MCV 89.3 90.0  MCH 30.1 29.2  MCHC 33.7 32.4  RDW 13.6 13.8    Chemistries   Recent Labs Lab 05/17/15 1743 05/18/15 0543 05/19/15 0405  NA 137 139 139  K 3.1* 3.4* 4.3  CL 102 108 109  CO2 GLUCOSE 164* 74 94  BUN 39* 31* 30*  CREATININE 1.98* 1.68* 1.49*  CALCIUM 9.8 8.5* 8.6*  MG  --  1.6*  --    ------------------------------------------------------------------------------------------------------------------ estimated creatinine clearance is 30.7 mL/min (by C-G formula based on Cr of 1.49). ------------------------------------------------------------------------------------------------------------------ No results for input(s): HGBA1C in the last 72 hours. ------------------------------------------------------------------------------------------------------------------ No results for input(s): CHOL, HDL, LDLCALC, TRIG, CHOLHDL, LDLDIRECT in the last 72  hours. ------------------------------------------------------------------------------------------------------------------ No results for input(s): TSH, T4TOTAL, T3FREE, THYROIDAB in the last 72 hours.  Invalid input(s): FREET3 ------------------------------------------------------------------------------------------------------------------ No results for input(s): VITAMINB12, FOLATE, FERRITIN, TIBC, IRON, RETICCTPCT in the last 72 hours.  Coagulation profile No results for input(s): INR, PROTIME in the last 168 hours.  No results for input(s): DDIMER in the last 72 hours.  Cardiac Enzymes No results for input(s): CKMB, TROPONINI, MYOGLOBIN in the last 168 hours.  Invalid input(s): CK ------------------------------------------------------------------------------------------------------------------ Invalid input(s): POCBNP  No results for input(s): GLUCAP in the last 72 hours.   Carol [name] M.D. [facility] 05/19/2015, 11:26 AM  Pager: [phone] Between 7am to 7pm - call Pager - [phone]  After 7pm go to [email or web address] - password [email or web address]  Call night coverage person covering after 7pm

## 2015-05-20 LAB — BASIC METABOLIC PANEL
ANION GAP: 7 (ref 5–15)
BUN: 18 mg/dL (ref 6–20)
CALCIUM: 8.9 mg/dL (ref 8.9–10.3)
CO2: 24 mmol/L (ref 22–32)
CREATININE: 1.12 mg/dL — AB (ref 0.44–1.00)
Chloride: 110 mmol/L (ref 101–111)
GFR, EST AFRICAN AMERICAN: 53 mL/min — AB (ref >60)
GFR, EST NON AFRICAN AMERICAN: 46 mL/min — AB (ref >60)
Glucose, Bld: 95 mg/dL (ref 65–99)
Potassium: 4 mmol/L (ref 3.5–5.1)
SODIUM: 141 mmol/L (ref 135–145)

## 2015-05-20 LAB — GLUCOSE, CAPILLARY: GLUCOSE-CAPILLARY: 94 mg/dL (ref 65–99)

## 2015-05-20 MED ORDER — AMLODIPINE BESYLATE 5 MG PO TABS: 5.0000 mg | ORAL_TABLET | Freq: Every day | ORAL | Status: DC

## 2015-05-20 MED ORDER — CEPHALEXIN 250 MG PO CAPS
250.0000 mg | ORAL_CAPSULE | Freq: Two times a day (BID) | ORAL | Status: DC
  Filled 2015-05-20 (×2): qty 1

## 2015-05-20 MED ORDER — CEPHALEXIN 250 MG PO CAPS: 250.0000 mg | ORAL_CAPSULE | Freq: Two times a day (BID) | ORAL | Status: DC

## 2015-05-20 MED ORDER — SODIUM CHLORIDE 0.9 % IV SOLN: INTRAVENOUS | Status: DC

## 2015-05-20 MED ORDER — AMLODIPINE BESYLATE 5 MG PO TABS
5.0000 mg | ORAL_TABLET | Freq: Every day | ORAL | Status: DC
Start: 2015-05-20 — End: 2015-05-20
  Administered 2015-05-20: 5 mg via ORAL
  Filled 2015-05-20: qty 1

## 2015-05-20 NOTE — Progress Notes (Signed)
Nsg Discharge Note  Admit Date:  05/17/2015 Discharge date: 05/20/2015   Carol [name] to be D/C'd Home per MD order.  AVS completed.  Copy for chart, and copy for patient signed, and dated. Patient/caregiver able to verbalize understanding.  Discharge Medication:   Medication List    STOP taking these medications        triamterene-hydrochlorothiazide 75-50 MG tablet  Commonly known as:  MAXZIDE     zoster vaccine live (PF) 19400 UNT/0.65ML injection  Commonly known as:  ZOSTAVAX      TAKE these medications        alendronate 70 MG tablet  Commonly known as:  FOSAMAX  Take 1 tablet (70 mg total) by mouth every 7 (seven) days. Take with a full glass of water on an empty stomach.     amLODipine 5 MG tablet  Commonly known as:  NORVASC  Take 1 tablet (5 mg total) by mouth daily.     atorvastatin 20 MG tablet  Commonly known as:  LIPITOR  Take 1 tablet (20 mg total) by mouth daily.     CALTRATE 600+D 600-400 MG-UNIT tablet  Generic drug:  Calcium Carbonate-Vitamin D  Take 1 tablet by mouth 2 (two) times a week.     cephALEXin 250 MG capsule  Commonly known as:  KEFLEX  Take 1 capsule (250 mg total) by mouth 2 (two) times daily. x2 days     desloratadine 5 MG tablet  Commonly known as:  CLARINEX  Take 1 tablet (5 mg total) by mouth daily. Take 1 tablet daily as needed for allergies.     EXELON 13.3 MG/24HR Pt24  Generic drug:  Rivastigmine  Place 1 patch onto the skin daily. Rotate sites     Memantine HCl-Donepezil HCl 28-10 MG Cp24  Commonly known as:  NAMZARIC  Take 1 capsule by mouth daily.     multivitamin with minerals tablet  Take 1 tablet by mouth daily.     polyethylene glycol powder powder  Commonly known as:  GLYCOLAX/MIRALAX  Take 17 g by mouth daily.     QUEtiapine 50 MG tablet  Commonly known as:  SEROQUEL  Take 1 tablet by mouth at bedtime        Discharge Assessment: Filed Vitals:   05/19/15 2125 05/20/15 0537  BP: 132/71 138/58   Pulse: 67 54  Temp: 99.3 F (37.4 C) 98.4 F (36.9 C)  Resp: 18 18   Skin clean, dry and intact without evidence of skin break down, no evidence of skin tears noted. IV catheter discontinued intact. Site without signs and symptoms of complications - no redness or edema noted at insertion site, patient denies c/o pain - only slight tenderness at site.  Dressing with slight pressure applied.  D/c Instructions-Education: Discharge instructions given to patient/family with verbalized understanding. D/c education completed with patient/family including follow up instructions, medication list, d/c activities limitations if indicated, with other d/c instructions as indicated by MD - patient able to verbalize understanding, all questions fully answered. Patient instructed to return to ED, call 911, or call MD for any changes in condition.  Patient escorted via WC, and D/C home via private auto.  Camillo FlamingVicki L Ashlie Mcmenamy, RN 05/20/2015 11:36 AM

## 2015-05-20 NOTE — Discharge Summary (Signed)
Physician Discharge Summary   Patient ID: Carol [name] MRN: [id number] DOB/AGE: 77-14-1937 77 y.o.  Admit date: 05/17/2015 Discharge date: 05/20/2015  Primary Care Physician:  Kirt Boys, DO  Discharge Diagnoses:    . Altered mental status . Essential hypertension, benign . Dementia with behavioral disturbance . UTI (urinary tract infection) . AKI (acute kidney injury) (HCC)  Consults:  none  Recommendations for Outpatient Follow-up:  1. Home health PT, OT, RN, HHA arranged 2. Please note, triamterene/HCTZ discontinued, started on Norvasc 5 mg daily for BP 3. Please follow CBC, BMET   DIET: heart healthy diet    Allergies:   Allergies  Allergen Reactions  . Penicillins     Rash & swelling     DISCHARGE MEDICATIONS: Current Discharge Medication List    START taking these medications   Details  amLODipine (NORVASC) 5 MG tablet Take 1 tablet (5 mg total) by mouth daily. Qty: 30 tablet, Refills: 2    cephALEXin (KEFLEX) 250 MG capsule Take 1 capsule (250 mg total) by mouth 2 (two) times daily. x2 days Qty: 4 capsule, Refills: 0      CONTINUE these medications which have NOT CHANGED   Details  alendronate (FOSAMAX) 70 MG tablet Take 1 tablet (70 mg total) by mouth every 7 (seven) days. Take with a full glass of water on an empty stomach. Qty: 4 tablet, Refills: 11    atorvastatin (LIPITOR) 20 MG tablet Take 1 tablet (20 mg total) by mouth daily. Qty: 30 tablet, Refills: 3   Associated Diagnoses: Hyperlipemia    Calcium Carbonate-Vitamin D (CALTRATE 600+D) 600-400 MG-UNIT per tablet Take 1 tablet by mouth 2 (two) times a week.    desloratadine (CLARINEX) 5 MG tablet Take 1 tablet (5 mg total) by mouth daily. Take 1 tablet daily as needed for allergies. Qty: 30 tablet, Refills: 5    Memantine HCl-Donepezil HCl (NAMZARIC) 28-10 MG CP24 Take 1 capsule by mouth daily. Qty: 30 capsule, Refills: 6    Multiple Vitamins-Minerals (MULTIVITAMIN WITH  MINERALS) tablet Take 1 tablet by mouth daily.    polyethylene glycol powder (GLYCOLAX/MIRALAX) powder Take 17 g by mouth daily. Qty: 3350 g, Refills: 1   Associated Diagnoses: Unspecified constipation    QUEtiapine (SEROQUEL) 50 MG tablet Take 1 tablet by mouth at bedtime Qty: 30 tablet, Refills: 6   Associated Diagnoses: Mixed Alzheimer's and vascular dementia    Rivastigmine (EXELON) 13.3 MG/24HR PT24 Place 1 patch onto the skin daily. Rotate sites      STOP taking these medications     triamterene-hydrochlorothiazide (MAXZIDE) 75-50 MG tablet      zoster vaccine live, PF, (ZOSTAVAX) 40981 UNT/0.65ML injection          Brief H and P: For complete details please refer to admission H and P, but in brief Carol [name] is a 79 y.o. female with a past medical history significant for dementia with behavioral disturbance, smoking, and HTN who presented with acute change in mental status. Per patient's daughter at the time of admission reported that the patient had been "laying about" for 2-3 days, more sluggish than usual, and last night seemed to be slurring her speech and on the day of admission and unsteady on her feet, so family brought her to the ER. No fever, no respiratory symptoms. Patient denies urinary symptoms. Had donepezil added, but this as three weeks ago. In the ED, the patient was hemodynamically stable but had new AKI and urinalysis showing evidence of bacteriuria. She  was started on ceftriaxone and TRH were asked to admit for presumed UTI.  Hospital Course:  Acute encephalopathy in the setting of underlying dementia, now worsened with UTI, acute kidney injury- resolved, back to baseline -CT head negative, stable mild cerebral atrophy, no acute infarct -patient was started on IV Rocephin, transitioned to oral Keflex at discharge - PT evaluation recommended home health PT, OTI was arranged by case management   Urinary tract infection Patient was placed on IV  Rocephin, urine culture showed multiple species, transitioned to oral Keflex x 2 more days   Acute kidney injury Creatinine 1.9 at the time of admission, resolved, 1.1 at discharge Possibly due to UTI, patient was placed on gentle hydration Discontinued triamterene HCTZ  Hypokalemia:  Likely due to HCTZ, replaced Discontinued triamterene/HCTZ  Hypomagnesemia Replaced IV  Dementia with behavioral disturbance:  Stable.  -Continue rivastigmine patch, memantine, donepezil, and quetiapine  HTN:  Discontinued Maxzide,placed on Norvasc 5 mg daily   Day of Discharge BP 138/58 mmHg  Pulse 54  Temp(Src) 98.4 F (36.9 C) (Oral)  Resp 18  Ht 5\' 7"  (1.702 m)  Wt 62.143 kg (137 lb)  BMI 21.45 kg/m2  SpO2 100%  Physical Exam: General: Alert and awake oriented x3 not in any acute distress. HEENT: anicteric sclera, pupils reactive to light and accommodation CVS: S1-S2 clear no murmur rubs or gallops Chest: clear to auscultation bilaterally, no wheezing rales or rhonchi Abdomen: soft nontender, nondistended, normal bowel sounds Extremities: no cyanosis, clubbing or edema noted bilaterally Neuro: Cranial nerves II-XII intact, no focal neurological deficits   The results of significant diagnostics from this hospitalization (including imaging, microbiology, ancillary and laboratory) are listed below for reference.    LAB RESULTS: Basic Metabolic Panel:  Recent Labs Lab 05/18/15 0543 05/19/15 0405 05/20/15 0526  NA 139 139 141  K 3.4* 4.3 4.0  CL 108 109 110  CO2 23 22 24   GLUCOSE 74 94 95  BUN 31* 30* 18  CREATININE 1.68* 1.49* 1.12*  CALCIUM 8.5* 8.6* 8.9  MG 1.6*  --   --    Liver Function Tests: No results for input(s): AST, ALT, ALKPHOS, BILITOT, PROT, ALBUMIN in the last 168 hours. No results for input(s): LIPASE, AMYLASE in the last 168 hours. No results for input(s): AMMONIA in the last 168 hours. CBC:  Recent Labs Lab 05/17/15 1743 05/18/15 0543  WBC  9.3 8.5  HGB 15.5* 13.1  HCT 46.0 40.4  MCV 89.3 90.0  PLT 165 132*   Cardiac Enzymes: No results for input(s): CKTOTAL, CKMB, CKMBINDEX, TROPONINI in the last 168 hours. BNP: Invalid input(s): POCBNP CBG:  Recent Labs Lab 05/20/15 0805  GLUCAP 94    Significant Diagnostic Studies:  Ct Head Wo Contrast  05/17/2015  CLINICAL DATA:  Altered mental status. EXAM: CT HEAD WITHOUT CONTRAST TECHNIQUE: Contiguous axial images were obtained from the base of the skull through the vertex without intravenous contrast. COMPARISON:  03/10/2013 FINDINGS: There is no evidence of intracranial hemorrhage, brain edema, or other signs of acute infarction. There is no evidence of intracranial mass lesion or mass effect. No abnormal extraaxial fluid collections are identified. Mild diffuse cerebral atrophy noted. Ventricles stable in size. Persistent cavum septum pellucidum and cavum vergae incidentally noted. No skull abnormality identified. Increased mucosal thickening seen involving right maxillary sinus, without evidence of air-fluid level. IMPRESSION: No acute intracranial findings.  Stable mild cerebral atrophy. Chronic right maxillary sinusitis incidentally noted. Electronically Signed   By: Alver SorrowJohn  Stahl M.D.  On: 05/17/2015 21:18    2D ECHO:   Disposition and Follow-up: Discharge Instructions    Diet - low sodium heart healthy    Complete by:  As directed      Discharge instructions    Complete by:  As directed   Please STOP Maxzide (Triamterene/HCTZ). Instead, you are started on new medication for BP, Norvasc  daily.     Increase activity slowly    Complete by:  As directed             DISPOSITION:home   DISCHARGE FOLLOW-UP Follow-up Information    Follow up with [facility].   Why:  HHPT, HHaide   Contact information:   [address] [place] [address] [phone]       [facility] with [facility]   Why:  rolling walker    Contact information:   [address] [place] [address] [phone]       Follow up with Kirt Boys, DO. Schedule an appointment as soon as possible for a visit in 2 weeks.   Specialty:  Internal Medicine   Why:  for hospital follow-up   Contact information:   [address] [place] [address] [phone]        Time spent on Discharge: 25 minutes  Signed:   RAI,RIPUDEEP M.D. [facility] 05/20/2015, 10:39 AM Pager: [phone]

## 2015-05-21 DIAGNOSIS — F0391 Unspecified dementia with behavioral disturbance: Secondary | ICD-10-CM | POA: Diagnosis not present

## 2015-05-21 DIAGNOSIS — F17210 Nicotine dependence, cigarettes, uncomplicated: Secondary | ICD-10-CM | POA: Diagnosis not present

## 2015-05-21 DIAGNOSIS — M810 Age-related osteoporosis without current pathological fracture: Secondary | ICD-10-CM | POA: Diagnosis not present

## 2015-05-21 DIAGNOSIS — N390 Urinary tract infection, site not specified: Secondary | ICD-10-CM | POA: Diagnosis not present

## 2015-05-21 DIAGNOSIS — I10 Essential (primary) hypertension: Secondary | ICD-10-CM | POA: Diagnosis not present

## 2015-05-22 DIAGNOSIS — F17210 Nicotine dependence, cigarettes, uncomplicated: Secondary | ICD-10-CM | POA: Diagnosis not present

## 2015-05-22 DIAGNOSIS — N390 Urinary tract infection, site not specified: Secondary | ICD-10-CM | POA: Diagnosis not present

## 2015-05-22 DIAGNOSIS — I10 Essential (primary) hypertension: Secondary | ICD-10-CM | POA: Diagnosis not present

## 2015-05-22 DIAGNOSIS — M810 Age-related osteoporosis without current pathological fracture: Secondary | ICD-10-CM | POA: Diagnosis not present

## 2015-05-22 DIAGNOSIS — F0391 Unspecified dementia with behavioral disturbance: Secondary | ICD-10-CM | POA: Diagnosis not present

## 2015-05-22 LAB — CULTURE, BLOOD (ROUTINE X 2)
CULTURE: NO GROWTH
Culture: NO GROWTH

## 2015-05-24 DIAGNOSIS — F17210 Nicotine dependence, cigarettes, uncomplicated: Secondary | ICD-10-CM | POA: Diagnosis not present

## 2015-05-24 DIAGNOSIS — I10 Essential (primary) hypertension: Secondary | ICD-10-CM | POA: Diagnosis not present

## 2015-05-24 DIAGNOSIS — N390 Urinary tract infection, site not specified: Secondary | ICD-10-CM | POA: Diagnosis not present

## 2015-05-24 DIAGNOSIS — F0391 Unspecified dementia with behavioral disturbance: Secondary | ICD-10-CM | POA: Diagnosis not present

## 2015-05-24 DIAGNOSIS — M810 Age-related osteoporosis without current pathological fracture: Secondary | ICD-10-CM | POA: Diagnosis not present

## 2015-05-28 DIAGNOSIS — N390 Urinary tract infection, site not specified: Secondary | ICD-10-CM | POA: Diagnosis not present

## 2015-05-28 DIAGNOSIS — F17210 Nicotine dependence, cigarettes, uncomplicated: Secondary | ICD-10-CM | POA: Diagnosis not present

## 2015-05-28 DIAGNOSIS — M810 Age-related osteoporosis without current pathological fracture: Secondary | ICD-10-CM | POA: Diagnosis not present

## 2015-05-28 DIAGNOSIS — F0391 Unspecified dementia with behavioral disturbance: Secondary | ICD-10-CM | POA: Diagnosis not present

## 2015-05-28 DIAGNOSIS — I10 Essential (primary) hypertension: Secondary | ICD-10-CM | POA: Diagnosis not present

## 2015-05-29 DIAGNOSIS — N390 Urinary tract infection, site not specified: Secondary | ICD-10-CM | POA: Diagnosis not present

## 2015-05-29 DIAGNOSIS — F17210 Nicotine dependence, cigarettes, uncomplicated: Secondary | ICD-10-CM | POA: Diagnosis not present

## 2015-05-29 DIAGNOSIS — M810 Age-related osteoporosis without current pathological fracture: Secondary | ICD-10-CM | POA: Diagnosis not present

## 2015-05-29 DIAGNOSIS — I10 Essential (primary) hypertension: Secondary | ICD-10-CM | POA: Diagnosis not present

## 2015-05-29 DIAGNOSIS — F0391 Unspecified dementia with behavioral disturbance: Secondary | ICD-10-CM | POA: Diagnosis not present

## 2015-05-31 DIAGNOSIS — F17210 Nicotine dependence, cigarettes, uncomplicated: Secondary | ICD-10-CM | POA: Diagnosis not present

## 2015-05-31 DIAGNOSIS — N390 Urinary tract infection, site not specified: Secondary | ICD-10-CM | POA: Diagnosis not present

## 2015-05-31 DIAGNOSIS — F0391 Unspecified dementia with behavioral disturbance: Secondary | ICD-10-CM | POA: Diagnosis not present

## 2015-05-31 DIAGNOSIS — M810 Age-related osteoporosis without current pathological fracture: Secondary | ICD-10-CM | POA: Diagnosis not present

## 2015-05-31 DIAGNOSIS — I10 Essential (primary) hypertension: Secondary | ICD-10-CM | POA: Diagnosis not present

## 2015-06-01 ENCOUNTER — Ambulatory Visit (INDEPENDENT_AMBULATORY_CARE_PROVIDER_SITE_OTHER): Payer: Medicare PPO | Admitting: Internal Medicine

## 2015-06-01 ENCOUNTER — Encounter: Payer: Self-pay | Admitting: Internal Medicine

## 2015-06-01 VITALS — BP 138/82 | HR 76 | Temp 97.5°F | Resp 20 | Ht 67.0 in | Wt 141.2 lb

## 2015-06-01 DIAGNOSIS — F329 Major depressive disorder, single episode, unspecified: Secondary | ICD-10-CM | POA: Diagnosis not present

## 2015-06-01 DIAGNOSIS — F0393 Unspecified dementia, unspecified severity, with mood disturbance: Secondary | ICD-10-CM

## 2015-06-01 DIAGNOSIS — F0150 Vascular dementia without behavioral disturbance: Secondary | ICD-10-CM

## 2015-06-01 DIAGNOSIS — F0280 Dementia in other diseases classified elsewhere without behavioral disturbance: Secondary | ICD-10-CM

## 2015-06-01 DIAGNOSIS — G309 Alzheimer's disease, unspecified: Secondary | ICD-10-CM

## 2015-06-01 DIAGNOSIS — N390 Urinary tract infection, site not specified: Secondary | ICD-10-CM

## 2015-06-01 DIAGNOSIS — R5383 Other fatigue: Secondary | ICD-10-CM | POA: Diagnosis not present

## 2015-06-01 DIAGNOSIS — I10 Essential (primary) hypertension: Secondary | ICD-10-CM

## 2015-06-01 MED ORDER — MEMANTINE HCL-DONEPEZIL HCL ER 28-10 MG PO CP24: 1.0000 | ORAL_CAPSULE | Freq: Every day | ORAL | Status: DC

## 2015-06-01 NOTE — Patient Instructions (Signed)
Continue current medications as ordered  Recommend [facility] to assist with caring for pt during the day.  Will call with lab results  Follow up in 1 month for dementia

## 2015-06-01 NOTE — Progress Notes (Signed)
Patient ID: Carol [name], female   DOB: 1936-02-13, 79 y.o.   MRN: [id number]    Location:    [facility]   Place of Service:  OFFICE   Chief Complaint  Patient presents with  . Medical Management of Chronic Issues    1 month follow-up for [place], Dementia, Hypertension  . OTHER     Patient has daughter's and granddaughter in room with her    HPI:  79 yo female seen today for f/u. She was admitted to hospital for UTI and change in MS. BP med changed from maxide to amlodipine. will need CBC and BMP. UTI tx with keflex. She is receiving HHA at least 2 weeks more. eval by PT/OT revealed no further tx. SW to meet next week. She had had a few baths since d/c. She feels tired.    Pt is a poor historian due to dementia. Hx obtained from daughters and chart.  Dementia - on seroquel and namzeric.  She is taking 1 tab of seroquel qhs. Pt continues to smoke. Needs med RF   Past Medical History  Diagnosis Date  . Cataract   . Rickets, late effect   . Other and unspecified hyperlipidemia   . Anxiety state, unspecified   . Tobacco use disorder   . Depressive disorder, not elsewhere classified   . Unspecified essential hypertension   . Memory loss   . Lumbago   . Unspecified constipation   . Varicose veins of lower extremities     Past Surgical History  Procedure Laterality Date  . Cyst excision Bilateral     behind ears  . Mouth surgery  04/12/2014    Patient Care Team: Gildardo Cranker, DO as PCP - General (Internal Medicine)  Social History   Social History  . Marital Status: Married    Spouse Name: N/A  . Number of Children: N/A  . Years of Education: N/A   Occupational History  . Not on file.   Social History Main Topics  . Smoking status: Current Every Day Smoker  . Smokeless tobacco: Never Used     Comment: No more than three cig daily   . Alcohol Use: No  . Drug Use: No  . Sexual Activity: Not on file   Other Topics Concern  . Not on file   Social History  Narrative     reports that she has been smoking.  She has never used smokeless tobacco. She reports that she does not drink alcohol or use illicit drugs.  Allergies  Allergen Reactions  . Penicillins     Rash & swelling    Medications: Patient's Medications  New Prescriptions   No medications on file  Previous Medications   ALENDRONATE (FOSAMAX) 70 MG TABLET    Take 1 tablet (70 mg total) by mouth every 7 (seven) days. Take with a full glass of water on an empty stomach.   AMLODIPINE (NORVASC) 5 MG TABLET    Take 1 tablet (5 mg total) by mouth daily.   ATORVASTATIN (LIPITOR) 20 MG TABLET    Take 1 tablet (20 mg total) by mouth daily.   CALCIUM CARBONATE-VITAMIN D (CALTRATE 600+D) 600-400 MG-UNIT PER TABLET    Take 1 tablet by mouth 2 (two) times a week.   DESLORATADINE (CLARINEX) 5 MG TABLET    Take 1 tablet (5 mg total) by mouth daily. Take 1 tablet daily as needed for allergies.   MEMANTINE HCL-DONEPEZIL HCL (NAMZARIC) 28-10 MG CP24    Take 1  capsule by mouth daily.   MULTIPLE VITAMINS-MINERALS (MULTIVITAMIN WITH MINERALS) TABLET    Take 1 tablet by mouth daily.   POLYETHYLENE GLYCOL POWDER (GLYCOLAX/MIRALAX) POWDER    Take 17 g by mouth daily.   QUETIAPINE (SEROQUEL) 50 MG TABLET    Take 1 tablet by mouth at bedtime   RIVASTIGMINE (EXELON) 13.3 MG/24HR PT24    Place 1 patch onto the skin daily. Rotate sites  Modified Medications   No medications on file  Discontinued Medications   CEPHALEXIN (KEFLEX) 250 MG CAPSULE    Take 1 capsule (250 mg total) by mouth 2 (two) times daily. x2 days    Review of Systems  Unable to perform ROS: Dementia    Filed Vitals:   06/01/15 1128  BP: 138/82  Pulse: 76  Temp: 97.5 F (36.4 C)  TempSrc: Oral  Resp: 20  Height: _0  (1.702 m)  Weight: 141 lb 3.2 oz (64.048 kg)  SpO2: 95%   Body mass index is 22.11 kg/(m^2).  Physical Exam  Constitutional: She appears well-developed and well-nourished.  Frail appearing in NAD  HENT:    Mouth/Throat: Oropharynx is clear and moist. No oropharyngeal exudate.  Eyes: Pupils are equal, round, and reactive to light. No scleral icterus.  Neck: Neck supple. Carotid bruit is not present. No tracheal deviation present.  Cardiovascular: Normal rate, regular rhythm, normal heart sounds and intact distal pulses.  Exam reveals no gallop and no friction rub.   No murmur heard. No LE edema b/l. no calf TTP.   Pulmonary/Chest: Effort normal and breath sounds normal. No stridor. No respiratory distress. She has no wheezes. She has no rales.  Abdominal: Soft. Bowel sounds are normal. She exhibits no distension and no mass. There is no hepatomegaly. There is no tenderness. There is no rebound and no guarding.  No CVAT  Lymphadenopathy:    She has no cervical adenopathy.  Neurological: She is alert.  Skin: Skin is warm and dry. No rash noted.  Psychiatric: She has a normal mood and affect. Her behavior is normal.     Labs reviewed: Admission on 05/17/2015, Discharged on 05/20/2015  Component Date Value Ref Range Status  . Sodium 05/17/2015 137  135 - 145 mmol/L Final  . Potassium 05/17/2015 3.1* 3.5 - 5.1 mmol/L Final  . Chloride 05/17/2015 102  101 - 111 mmol/L Final  . CO2 05/17/2015 23  22 - 32 mmol/L Final  . Glucose, Bld 05/17/2015 164* 65 - 99 mg/dL Final  . BUN 05/17/2015 39* 6 - 20 mg/dL Final  . Creatinine, Ser 05/17/2015 1.98* 0.44 - 1.00 mg/dL Final  . Calcium 05/17/2015 9.8  8.9 - 10.3 mg/dL Final  . GFR calc non Af Amer 05/17/2015 23* >60 mL/min Final  . GFR calc Af Amer 05/17/2015 27* >60 mL/min Final   Comment: (NOTE) The eGFR has been calculated using the CKD EPI equation. This calculation has not been validated in all clinical situations. eGFR's persistently <60 mL/min signify possible Chronic Kidney Disease.   . Anion gap 05/17/2015 12  5 - 15 Final  . WBC 05/17/2015 9.3  4.0 - 10.5 K/uL Final  . RBC 05/17/2015 5.15* 3.87 - 5.11 MIL/uL Final  . Hemoglobin  05/17/2015 15.5* 12.0 - 15.0 g/dL Final  . HCT 05/17/2015 46.0  36.0 - 46.0 % Final  . MCV 05/17/2015 89.3  78.0 - 100.0 fL Final  . MCH 05/17/2015 30.1  26.0 - 34.0 pg Final  . MCHC 05/17/2015 33.7  30.0 -  36.0 g/dL Final  . RDW 05/17/2015 13.6  11.5 - 15.5 % Final  . Platelets 05/17/2015 165  150 - 400 K/uL Final  . Color, Urine 05/17/2015 YELLOW  YELLOW Final  . APPearance 05/17/2015 CLOUDY* CLEAR Final  . Specific Gravity, Urine 05/17/2015 1.015  1.005 - 1.030 Final  . pH 05/17/2015 5.0  5.0 - 8.0 Final  . Glucose, UA 05/17/2015 NEGATIVE  NEGATIVE mg/dL Final  . Hgb urine dipstick 05/17/2015 TRACE* NEGATIVE Final  . Bilirubin Urine 05/17/2015 SMALL* NEGATIVE Final  . Ketones, ur 05/17/2015 NEGATIVE  NEGATIVE mg/dL Final  . Protein, ur 05/17/2015 NEGATIVE  NEGATIVE mg/dL Final  . Nitrite 05/17/2015 NEGATIVE  NEGATIVE Final  . Leukocytes, UA 05/17/2015 LARGE* NEGATIVE Final  . Squamous Epithelial / LPF 05/17/2015 6-30* NONE SEEN Final  . WBC, UA 05/17/2015 6-30  0 - 5 WBC/hpf Final  . RBC / HPF 05/17/2015 0-5  0 - 5 RBC/hpf Final  . Bacteria, UA 05/17/2015 MANY* NONE SEEN Final  . Lactic Acid, Venous 05/17/2015 1.78  0.5 - 2.0 mmol/L Final  . Specimen Description 05/17/2015 URINE, CLEAN CATCH   Final  . Special Requests 05/17/2015 NONE   Final  . Culture 05/17/2015 MULTIPLE SPECIES PRESENT, SUGGEST RECOLLECTION   Final  . Report Status 05/17/2015 05/19/2015 FINAL   Final  . Specimen Description 05/17/2015 BLOOD RIGHT HAND   Final  . Special Requests 05/17/2015 BOTTLES DRAWN AEROBIC ONLY 6CC   Final  . Culture 05/17/2015 NO GROWTH 5 DAYS   Final  . Report Status 05/17/2015 05/22/2015 FINAL   Final  . Specimen Description 05/17/2015 BLOOD LEFT HAND   Final  . Special Requests 05/17/2015 BOTTLES DRAWN AEROBIC ONLY 10CC   Final  . Culture 05/17/2015 NO GROWTH 5 DAYS   Final  . Report Status 05/17/2015 05/22/2015 FINAL   Final  . Sodium 05/18/2015 139  135 - 145 mmol/L Final  .  Potassium 05/18/2015 3.4* 3.5 - 5.1 mmol/L Final  . Chloride 05/18/2015 108  101 - 111 mmol/L Final  . CO2 05/18/2015 23  22 - 32 mmol/L Final  . Glucose, Bld 05/18/2015 74  65 - 99 mg/dL Final  . BUN 05/18/2015 31* 6 - 20 mg/dL Final  . Creatinine, Ser 05/18/2015 1.68* 0.44 - 1.00 mg/dL Final  . Calcium 05/18/2015 8.5* 8.9 - 10.3 mg/dL Final  . GFR calc non Af Amer 05/18/2015 28* >60 mL/min Final  . GFR calc Af Amer 05/18/2015 33* >60 mL/min Final   Comment: (NOTE) The eGFR has been calculated using the CKD EPI equation. This calculation has not been validated in all clinical situations. eGFR's persistently <60 mL/min signify possible Chronic Kidney Disease.   . Anion gap 05/18/2015 8  5 - 15 Final  . WBC 05/18/2015 8.5  4.0 - 10.5 K/uL Final  . RBC 05/18/2015 4.49  3.87 - 5.11 MIL/uL Final  . Hemoglobin 05/18/2015 13.1  12.0 - 15.0 g/dL Final  . HCT 05/18/2015 40.4  36.0 - 46.0 % Final  . MCV 05/18/2015 90.0  78.0 - 100.0 fL Final  . MCH 05/18/2015 29.2  26.0 - 34.0 pg Final  . MCHC 05/18/2015 32.4  30.0 - 36.0 g/dL Final  . RDW 05/18/2015 13.8  11.5 - 15.5 % Final  . Platelets 05/18/2015 132* 150 - 400 K/uL Final  . Magnesium 05/18/2015 1.6* 1.7 - 2.4 mg/dL Final  . Sodium 05/19/2015 139  135 - 145 mmol/L Final  . Potassium 05/19/2015 4.3  3.5 - 5.1 mmol/L  Final   DELTA CHECK NOTED  . Chloride 05/19/2015 109  101 - 111 mmol/L Final  . CO2 05/19/2015 22  22 - 32 mmol/L Final  . Glucose, Bld 05/19/2015 94  65 - 99 mg/dL Final  . BUN 05/19/2015 30* 6 - 20 mg/dL Final  . Creatinine, Ser 05/19/2015 1.49* 0.44 - 1.00 mg/dL Final  . Calcium 05/19/2015 8.6* 8.9 - 10.3 mg/dL Final  . GFR calc non Af Amer 05/19/2015 33* >60 mL/min Final  . GFR calc Af Amer 05/19/2015 38* >60 mL/min Final   Comment: (NOTE) The eGFR has been calculated using the CKD EPI equation. This calculation has not been validated in all clinical situations. eGFR's persistently <60 mL/min signify possible Chronic  Kidney Disease.   . Anion gap 05/19/2015 8  5 - 15 Final  . Sodium 05/20/2015 141  135 - 145 mmol/L Final  . Potassium 05/20/2015 4.0  3.5 - 5.1 mmol/L Final  . Chloride 05/20/2015 110  101 - 111 mmol/L Final  . CO2 05/20/2015 24  22 - 32 mmol/L Final  . Glucose, Bld 05/20/2015 95  65 - 99 mg/dL Final  . BUN 05/20/2015 18  6 - 20 mg/dL Final  . Creatinine, Ser 05/20/2015 1.12* 0.44 - 1.00 mg/dL Final  . Calcium 05/20/2015 8.9  8.9 - 10.3 mg/dL Final  . GFR calc non Af Amer 05/20/2015 46* >60 mL/min Final  . GFR calc Af Amer 05/20/2015 53* >60 mL/min Final   Comment: (NOTE) The eGFR has been calculated using the CKD EPI equation. This calculation has not been validated in all clinical situations. eGFR's persistently <60 mL/min signify possible Chronic Kidney Disease.   . Anion gap 05/20/2015 7  5 - 15 Final  . Glucose-Capillary 05/20/2015 94  65 - 99 mg/dL Final    Ct Head Wo Contrast  05/17/2015  CLINICAL DATA:  Altered mental status. EXAM: CT HEAD WITHOUT CONTRAST TECHNIQUE: Contiguous axial images were obtained from the base of the skull through the vertex without intravenous contrast. COMPARISON:  03/10/2013 FINDINGS: There is no evidence of intracranial hemorrhage, brain edema, or other signs of acute infarction. There is no evidence of intracranial mass lesion or mass effect. No abnormal extraaxial fluid collections are identified. Mild diffuse cerebral atrophy noted. Ventricles stable in size. Persistent cavum septum pellucidum and cavum vergae incidentally noted. No skull abnormality identified. Increased mucosal thickening seen involving right maxillary sinus, without evidence of air-fluid level. IMPRESSION: No acute intracranial findings.  Stable mild cerebral atrophy. Chronic right maxillary sinusitis incidentally noted. Electronically Signed   By: Earle Gell M.D.   On: 05/17/2015 21:18     Assessment/Plan   ICD-9-CM ICD-10-CM   1. Other fatigue 780.79 R53.83 CBC with  Differential     Urinalysis with Reflex Microscopic     Basic Metabolic Panel  2. Mixed Alzheimer's and vascular dementia - unchanged 331.0 Z66.2 Basic Metabolic Panel   947.65 Y65.03    290.40 F02.80   3. Depression due to dementia - stable 311 F32.9    294.10 F02.80   4. Essential hypertension, benign - stable 401.1 I10 CBC with Differential  5. Urinary tract infection, site not specified - stable 599.0 N39.0 Urinalysis with Reflex Microscopic     Basic Metabolic Panel     Culture, Urine     Culture, Urine   will Rx abx if urine (+) for UTI  New rx namzeric sent to pharmacy  She will need to cont HHA as long as possible  Continue current medications as ordered  Recommend [facility] to assist with caring for pt during the day.  Will call with lab results  Follow up in 1 month for dementia  Quaran Kedzierski S. Perlie Gold  [facility] [address] [place], [place] [address] [phone] Cell (Monday-Friday 8 AM - 5 PM) [phone] After 5 PM and follow prompts

## 2015-06-02 LAB — URINALYSIS, ROUTINE W REFLEX MICROSCOPIC
Bilirubin, UA: NEGATIVE
Glucose, UA: NEGATIVE
KETONES UA: NEGATIVE
Nitrite, UA: NEGATIVE
RBC, UA: NEGATIVE
SPEC GRAV UA: 1.014 (ref 1.005–1.030)
Urobilinogen, Ur: 1 mg/dL (ref 0.2–1.0)
pH, UA: 6.5 (ref 5.0–7.5)

## 2015-06-02 LAB — BASIC METABOLIC PANEL
BUN / CREAT RATIO: 11 (ref 11–26)
BUN: 9 mg/dL (ref 8–27)
CALCIUM: 8.9 mg/dL (ref 8.7–10.3)
CHLORIDE: 104 mmol/L (ref 96–106)
CO2: 22 mmol/L (ref 18–29)
Creatinine, Ser: 0.85 mg/dL (ref 0.57–1.00)
GFR calc Af Amer: 76 mL/min/{1.73_m2} (ref >59)
GFR calc non Af Amer: 66 mL/min/{1.73_m2} (ref >59)
Glucose: 99 mg/dL (ref 65–99)
Potassium: 3.2 mmol/L — ABNORMAL LOW (ref 3.5–5.2)
SODIUM: 147 mmol/L — AB (ref 134–144)

## 2015-06-02 LAB — CBC WITH DIFFERENTIAL/PLATELET
BASOS ABS: 0 10*3/uL (ref 0.0–0.2)
Basos: 0 %
EOS (ABSOLUTE): 0.1 10*3/uL (ref 0.0–0.4)
Eos: 2 %
HEMOGLOBIN: 14 g/dL (ref 11.1–15.9)
Hematocrit: 41.7 % (ref 34.0–46.6)
Immature Grans (Abs): 0 10*3/uL (ref 0.0–0.1)
Immature Granulocytes: 0 %
LYMPHS ABS: 2.4 10*3/uL (ref 0.7–3.1)
Lymphs: 34 %
MCH: 29.2 pg (ref 26.6–33.0)
MCHC: 33.6 g/dL (ref 31.5–35.7)
MCV: 87 fL (ref 79–97)
Monocytes Absolute: 0.5 10*3/uL (ref 0.1–0.9)
Monocytes: 6 %
NEUTROS ABS: 4 10*3/uL (ref 1.4–7.0)
Neutrophils: 58 %
PLATELETS: 317 10*3/uL (ref 150–379)
RBC: 4.79 x10E6/uL (ref 3.77–5.28)
RDW: 13.7 % (ref 12.3–15.4)
WBC: 7 10*3/uL (ref 3.4–10.8)

## 2015-06-02 LAB — MICROSCOPIC EXAMINATION: Casts: NONE SEEN /lpf

## 2015-06-04 DIAGNOSIS — F17210 Nicotine dependence, cigarettes, uncomplicated: Secondary | ICD-10-CM | POA: Diagnosis not present

## 2015-06-04 DIAGNOSIS — N390 Urinary tract infection, site not specified: Secondary | ICD-10-CM | POA: Diagnosis not present

## 2015-06-04 DIAGNOSIS — I10 Essential (primary) hypertension: Secondary | ICD-10-CM | POA: Diagnosis not present

## 2015-06-04 DIAGNOSIS — M810 Age-related osteoporosis without current pathological fracture: Secondary | ICD-10-CM | POA: Diagnosis not present

## 2015-06-04 DIAGNOSIS — F0391 Unspecified dementia with behavioral disturbance: Secondary | ICD-10-CM | POA: Diagnosis not present

## 2015-06-04 LAB — URINE CULTURE

## 2015-06-05 DIAGNOSIS — I10 Essential (primary) hypertension: Secondary | ICD-10-CM | POA: Diagnosis not present

## 2015-06-05 DIAGNOSIS — F0391 Unspecified dementia with behavioral disturbance: Secondary | ICD-10-CM | POA: Diagnosis not present

## 2015-06-05 DIAGNOSIS — M810 Age-related osteoporosis without current pathological fracture: Secondary | ICD-10-CM | POA: Diagnosis not present

## 2015-06-05 DIAGNOSIS — F17210 Nicotine dependence, cigarettes, uncomplicated: Secondary | ICD-10-CM | POA: Diagnosis not present

## 2015-06-05 DIAGNOSIS — N390 Urinary tract infection, site not specified: Secondary | ICD-10-CM | POA: Diagnosis not present

## 2015-06-06 ENCOUNTER — Telehealth: Payer: Self-pay | Admitting: *Deleted

## 2015-06-06 DIAGNOSIS — F0391 Unspecified dementia with behavioral disturbance: Secondary | ICD-10-CM | POA: Diagnosis not present

## 2015-06-06 DIAGNOSIS — N390 Urinary tract infection, site not specified: Secondary | ICD-10-CM | POA: Diagnosis not present

## 2015-06-06 DIAGNOSIS — F17210 Nicotine dependence, cigarettes, uncomplicated: Secondary | ICD-10-CM | POA: Diagnosis not present

## 2015-06-06 DIAGNOSIS — M810 Age-related osteoporosis without current pathological fracture: Secondary | ICD-10-CM | POA: Diagnosis not present

## 2015-06-06 DIAGNOSIS — I10 Essential (primary) hypertension: Secondary | ICD-10-CM | POA: Diagnosis not present

## 2015-06-06 NOTE — Telephone Encounter (Signed)
Prior Authorization completed on Cover My Meds for Namzaric. Key: [id number] Medication APPROVED case #: [id number]

## 2015-06-07 DIAGNOSIS — N390 Urinary tract infection, site not specified: Secondary | ICD-10-CM | POA: Diagnosis not present

## 2015-06-07 DIAGNOSIS — M810 Age-related osteoporosis without current pathological fracture: Secondary | ICD-10-CM | POA: Diagnosis not present

## 2015-06-07 DIAGNOSIS — F0391 Unspecified dementia with behavioral disturbance: Secondary | ICD-10-CM | POA: Diagnosis not present

## 2015-06-07 DIAGNOSIS — F17210 Nicotine dependence, cigarettes, uncomplicated: Secondary | ICD-10-CM | POA: Diagnosis not present

## 2015-06-07 DIAGNOSIS — I10 Essential (primary) hypertension: Secondary | ICD-10-CM | POA: Diagnosis not present

## 2015-06-12 ENCOUNTER — Telehealth: Payer: Self-pay | Admitting: *Deleted

## 2015-06-12 NOTE — Telephone Encounter (Signed)
Beth with [facility] called and [facility]. Wants verbal orders to go a few more weeks. Verbal order given.

## 2015-07-04 ENCOUNTER — Encounter: Payer: Self-pay | Admitting: Internal Medicine

## 2015-07-04 ENCOUNTER — Ambulatory Visit (INDEPENDENT_AMBULATORY_CARE_PROVIDER_SITE_OTHER): Payer: Medicare Other | Admitting: Internal Medicine

## 2015-07-04 VITALS — BP 128/70 | HR 84 | Temp 98.3°F | Resp 20 | Ht 67.0 in | Wt 132.0 lb

## 2015-07-04 DIAGNOSIS — R634 Abnormal weight loss: Secondary | ICD-10-CM

## 2015-07-04 DIAGNOSIS — F0280 Dementia in other diseases classified elsewhere without behavioral disturbance: Secondary | ICD-10-CM | POA: Diagnosis not present

## 2015-07-04 DIAGNOSIS — F329 Major depressive disorder, single episode, unspecified: Secondary | ICD-10-CM | POA: Diagnosis not present

## 2015-07-04 DIAGNOSIS — G309 Alzheimer's disease, unspecified: Secondary | ICD-10-CM

## 2015-07-04 DIAGNOSIS — F0150 Vascular dementia without behavioral disturbance: Secondary | ICD-10-CM | POA: Diagnosis not present

## 2015-07-04 DIAGNOSIS — F0393 Unspecified dementia, unspecified severity, with mood disturbance: Secondary | ICD-10-CM
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
## 2015-07-04 NOTE — Patient Instructions (Addendum)
Recommend memory care unit  Continue current medications as ordered  Recommend meals-on-wheels 5 days per week  Follow up in 1 month  ABSOLUTELY NO DRIVING!!

## 2015-07-31 ENCOUNTER — Other Ambulatory Visit: Payer: Self-pay | Admitting: Internal Medicine

## 2015-08-01 ENCOUNTER — Other Ambulatory Visit: Payer: Self-pay | Admitting: Internal Medicine

## 2015-08-01 ENCOUNTER — Ambulatory Visit (INDEPENDENT_AMBULATORY_CARE_PROVIDER_SITE_OTHER): Payer: Medicare Other | Admitting: Internal Medicine

## 2015-08-01 ENCOUNTER — Encounter: Payer: Self-pay | Admitting: Internal Medicine

## 2015-08-01 VITALS — BP 124/78 | HR 64 | Temp 98.2°F | Resp 20 | Ht 67.0 in | Wt 132.4 lb

## 2015-08-01 DIAGNOSIS — R634 Abnormal weight loss: Secondary | ICD-10-CM | POA: Diagnosis not present

## 2015-08-01 DIAGNOSIS — F0280 Dementia in other diseases classified elsewhere without behavioral disturbance: Secondary | ICD-10-CM | POA: Diagnosis not present

## 2015-08-01 DIAGNOSIS — E785 Hyperlipidemia, unspecified: Secondary | ICD-10-CM | POA: Diagnosis not present

## 2015-08-01 DIAGNOSIS — G309 Alzheimer's disease, unspecified: Secondary | ICD-10-CM | POA: Diagnosis not present

## 2015-08-01 DIAGNOSIS — F329 Major depressive disorder, single episode, unspecified: Secondary | ICD-10-CM | POA: Diagnosis not present

## 2015-08-01 DIAGNOSIS — F0150 Vascular dementia without behavioral disturbance: Secondary | ICD-10-CM | POA: Diagnosis not present

## 2015-08-01 DIAGNOSIS — F0393 Unspecified dementia, unspecified severity, with mood disturbance: Secondary | ICD-10-CM

## 2015-08-01 DIAGNOSIS — E878 Other disorders of electrolyte and fluid balance, not elsewhere classified: Secondary | ICD-10-CM

## 2015-08-01 DIAGNOSIS — I10 Essential (primary) hypertension: Secondary | ICD-10-CM | POA: Diagnosis not present

## 2015-08-01 NOTE — Patient Instructions (Addendum)
STOP FOSAMAX  START CALTRATE 1 TABLET DAILY  Continue other medications as ordered  Return to office for lab in 1-2 weeks  Follow up in 1 month for routine visit  STOP DRIVING!!

## 2015-08-01 NOTE — Progress Notes (Signed)
Patient ID: Carol [name], female   DOB: 1935-10-05, 80 y.o.   MRN: [id number]    Location:    [facility]   Place of Service:   OFFICE  Chief Complaint  Patient presents with  . Medical Management of Chronic Issues    HPI:  80 yo female seen today for f/u. Family has private duty care 2 times per week (Mon/Fri) where she gets bath and washes hair. Meals-on-wheels daily Mon- Fri. She gained 6 oz since last visit. She is still driving. She is hording paper towels, toilet paper and laundry detergent. She is taking namzaric and has exelon patch. She has behavioral disturbance and takes seroquel  HTN - takes amlodipine   Hyperlipidemia -takes lipitor  Osteopenia - not taking fosamax in several mos. Not on caltrate  Past Medical History  Diagnosis Date  . Cataract   . Rickets, late effect   . Other and unspecified hyperlipidemia   . Anxiety state, unspecified   . Tobacco use disorder   . Depressive disorder, not elsewhere classified   . Unspecified essential hypertension   . Memory loss   . Lumbago   . Unspecified constipation   . Varicose veins of lower extremities     Past Surgical History  Procedure Laterality Date  . Cyst excision Bilateral     behind ears  . Mouth surgery  04/12/2014    Patient Care Team: Gildardo Cranker, DO as PCP - General (Internal Medicine)  Social History   Social History  . Marital Status: Married    Spouse Name: N/A  . Number of Children: N/A  . Years of Education: N/A   Occupational History  . Not on file.   Social History Main Topics  . Smoking status: Current Every Day Smoker  . Smokeless tobacco: Never Used     Comment: No more than three cig daily   . Alcohol Use: No  . Drug Use: No  . Sexual Activity: Not on file   Other Topics Concern  . Not on file   Social History Narrative     reports that she has been smoking.  She has never used smokeless tobacco. She reports that she does not drink alcohol or use illicit  drugs.  Allergies  Allergen Reactions  . Penicillins     Rash & swelling    Medications: Patient's Medications  New Prescriptions   No medications on file  Previous Medications   ALENDRONATE (FOSAMAX) 70 MG TABLET    Take 1 tablet (70 mg total) by mouth every 7 (seven) days. Take with a full glass of water on an empty stomach.   AMLODIPINE (NORVASC) 5 MG TABLET    Take 1 tablet (5 mg total) by mouth daily.   ATORVASTATIN (LIPITOR) 20 MG TABLET    TAKE 1 TABLET(20 MG) BY MOUTH DAILY   CALCIUM CARBONATE-VITAMIN D (CALTRATE 600+D) 600-400 MG-UNIT PER TABLET    Take 1 tablet by mouth 2 (two) times a week.   DESLORATADINE (CLARINEX) 5 MG TABLET    Take 1 tablet (5 mg total) by mouth daily. Take 1 tablet daily as needed for allergies.   MEMANTINE HCL-DONEPEZIL HCL (NAMZARIC) 28-10 MG CP24    Take 1 capsule by mouth daily.   MULTIPLE VITAMINS-MINERALS (MULTIVITAMIN WITH MINERALS) TABLET    Take 1 tablet by mouth daily.   POLYETHYLENE GLYCOL POWDER (GLYCOLAX/MIRALAX) POWDER    Take 17 g by mouth daily.   QUETIAPINE (SEROQUEL) 50 MG TABLET    Take 1  tablet by mouth at bedtime   RIVASTIGMINE (EXELON) 13.3 MG/24HR PT24    Place 1 patch onto the skin daily. Rotate sites  Modified Medications   No medications on file  Discontinued Medications   No medications on file    Review of Systems  Unable to perform ROS: Dementia    Filed Vitals:   08/01/15 1606  BP: 124/78  Pulse: 64  Temp: 98.2 F (36.8 C)  TempSrc: Oral  Resp: 20  Height: _0  (1.702 m)  Weight: 132 lb 6.4 oz (60.056 kg)  SpO2: 97%   Body mass index is 20.73 kg/(m^2).  Physical Exam  Constitutional: She appears well-developed.  Frail appearing in NAD  HENT:  Mouth/Throat: Oropharynx is clear and moist. No oropharyngeal exudate.  Neck: Carotid bruit is not present.  Cardiovascular: Normal rate, regular rhythm, normal heart sounds and intact distal pulses.  Exam reveals no gallop and no friction rub.   No murmur  heard. Trace LE edema b/l. No calf TTP. Soft varicose veins b/l calf  Pulmonary/Chest: Effort normal and breath sounds normal. No respiratory distress. She has no wheezes. She has no rales. She exhibits no tenderness.  Abdominal: There is no hepatomegaly.  Neurological: She is alert.  Skin: Skin is warm and dry. No rash noted.  Psychiatric: She has a normal mood and affect. She is agitated.     Labs reviewed: Office Visit on 06/01/2015  Component Date Value Ref Range Status  . WBC 06/01/2015 7.0  3.4 - 10.8 x10E3/uL Final  . RBC 06/01/2015 4.79  3.77 - 5.28 x10E6/uL Final  . Hemoglobin 06/01/2015 14.0  11.1 - 15.9 g/dL Final  . Hematocrit 06/01/2015 41.7  34.0 - 46.6 % Final  . MCV 06/01/2015 87  79 - 97 fL Final  . MCH 06/01/2015 29.2  26.6 - 33.0 pg Final  . MCHC 06/01/2015 33.6  31.5 - 35.7 g/dL Final  . RDW 06/01/2015 13.7  12.3 - 15.4 % Final  . Platelets 06/01/2015 317  150 - 379 x10E3/uL Final  . Neutrophils 06/01/2015 58   Final  . Lymphs 06/01/2015 34   Final  . Monocytes 06/01/2015 6   Final  . Eos 06/01/2015 2   Final  . Basos 06/01/2015 0   Final  . Neutrophils Absolute 06/01/2015 4.0  1.4 - 7.0 x10E3/uL Final  . Lymphocytes Absolute 06/01/2015 2.4  0.7 - 3.1 x10E3/uL Final  . Monocytes Absolute 06/01/2015 0.5  0.1 - 0.9 x10E3/uL Final  . EOS (ABSOLUTE) 06/01/2015 0.1  0.0 - 0.4 x10E3/uL Final  . Basophils Absolute 06/01/2015 0.0  0.0 - 0.2 x10E3/uL Final  . Immature Granulocytes 06/01/2015 0   Final  . Immature Grans (Abs) 06/01/2015 0.0  0.0 - 0.1 x10E3/uL Final  . Specific Gravity, UA 06/01/2015 1.014  1.005 - 1.030 Final  . pH, UA 06/01/2015 6.5  5.0 - 7.5 Final  . Color, UA 06/01/2015 Yellow  Yellow Final  . Appearance Ur 06/01/2015 Cloudy* Clear Final  . Leukocytes, UA 06/01/2015 Trace* Negative Final  . Protein, UA 06/01/2015 1+* Negative/Trace Final  . Glucose, UA 06/01/2015 Negative  Negative Final  . Ketones, UA 06/01/2015 Negative  Negative Final  .  RBC, UA 06/01/2015 Negative  Negative Final  . Bilirubin, UA 06/01/2015 Negative  Negative Final  . Urobilinogen, Ur 06/01/2015 1.0  0.2 - 1.0 mg/dL Final  . Nitrite, UA 06/01/2015 Negative  Negative Final  . Microscopic Examination 06/01/2015 See below:   Final   Microscopic was  indicated and was performed.  . Glucose 06/01/2015 99  65 - 99 mg/dL Final  . BUN 06/01/2015 9  8 - 27 mg/dL Final  . Creatinine, Ser 06/01/2015 0.85  0.57 - 1.00 mg/dL Final  . GFR calc non Af Amer 06/01/2015 66  >59 mL/min/1.73 Final  . GFR calc Af Amer 06/01/2015 76  >59 mL/min/1.73 Final  . BUN/Creatinine Ratio 06/01/2015 11  11 - 26 Final  . Sodium 06/01/2015 147* 134 - 144 mmol/L Final  . Potassium 06/01/2015 3.2* 3.5 - 5.2 mmol/L Final  . Chloride 06/01/2015 104  96 - 106 mmol/L Final  . CO2 06/01/2015 22  18 - 29 mmol/L Final  . Calcium 06/01/2015 8.9  8.7 - 10.3 mg/dL Final  . Urine Culture, Routine 06/01/2015 Final report*  Final  . Urine Culture result 1 06/01/2015 Staphylococcus aureus*  Final   Comment: 25,000-50,000 colony forming units per mL Based on resistance to oxacillin this isolate would be resistant to all currently available beta-lactam antimicrobial agents, with the exception of the newer cephalosporins with anti-MRSA activity, such as Ceftaroline Methicillin resistant (MRSA)   . ANTIMICROBIAL SUSCEPTIBILITY 06/01/2015 Comment   Final   Comment:       ** S = Susceptible; I = Intermediate; R = Resistant **                    P = Positive; N = Negative             MICS are expressed in micrograms per mL    Antibiotic                 RSLT#1    RSLT#2    RSLT#3    RSLT#4 Ciprofloxacin                  R Gentamicin                     S Levofloxacin                   R Nitrofurantoin                 S Oxacillin                      R Penicillin                     R Rifampin                       S Tetracycline                   S Trimethoprim/Sulfa             S Vancomycin                      S   . WBC, UA 06/01/2015 0-5  0 -  5 /hpf Final  . RBC, UA 06/01/2015 0-2  0 -  2 /hpf Final  . Epithelial Cells (non renal) 06/01/2015 0-10  0 - 10 /hpf Final  . Casts 06/01/2015 None seen  None seen /lpf Final  . Mucus, UA 06/01/2015 Present  Not Estab. Final  . Bacteria, UA 06/01/2015 Few  None seen/Few Final  Admission on 05/17/2015, Discharged on 05/20/2015  Component Date Value Ref Range Status  . Sodium 05/17/2015 137  135 - 145 mmol/L Final  .  Potassium 05/17/2015 3.1* 3.5 - 5.1 mmol/L Final  . Chloride 05/17/2015 102  101 - 111 mmol/L Final  . CO2 05/17/2015 23  22 - 32 mmol/L Final  . Glucose, Bld 05/17/2015 164* 65 - 99 mg/dL Final  . BUN 05/17/2015 39* 6 - 20 mg/dL Final  . Creatinine, Ser 05/17/2015 1.98* 0.44 - 1.00 mg/dL Final  . Calcium 05/17/2015 9.8  8.9 - 10.3 mg/dL Final  . GFR calc non Af Amer 05/17/2015 23* >60 mL/min Final  . GFR calc Af Amer 05/17/2015 27* >60 mL/min Final   Comment: (NOTE) The eGFR has been calculated using the CKD EPI equation. This calculation has not been validated in all clinical situations. eGFR's persistently <60 mL/min signify possible Chronic Kidney Disease.   . Anion gap 05/17/2015 12  5 - 15 Final  . WBC 05/17/2015 9.3  4.0 - 10.5 K/uL Final  . RBC 05/17/2015 5.15* 3.87 - 5.11 MIL/uL Final  . Hemoglobin 05/17/2015 15.5* 12.0 - 15.0 g/dL Final  . HCT 05/17/2015 46.0  36.0 - 46.0 % Final  . MCV 05/17/2015 89.3  78.0 - 100.0 fL Final  . MCH 05/17/2015 30.1  26.0 - 34.0 pg Final  . MCHC 05/17/2015 33.7  30.0 - 36.0 g/dL Final  . RDW 05/17/2015 13.6  11.5 - 15.5 % Final  . Platelets 05/17/2015 165  150 - 400 K/uL Final  . Color, Urine 05/17/2015 YELLOW  YELLOW Final  . APPearance 05/17/2015 CLOUDY* CLEAR Final  . Specific Gravity, Urine 05/17/2015 1.015  1.005 - 1.030 Final  . pH 05/17/2015 5.0  5.0 - 8.0 Final  . Glucose, UA 05/17/2015 NEGATIVE  NEGATIVE mg/dL Final  . Hgb urine dipstick 05/17/2015 TRACE* NEGATIVE  Final  . Bilirubin Urine 05/17/2015 SMALL* NEGATIVE Final  . Ketones, ur 05/17/2015 NEGATIVE  NEGATIVE mg/dL Final  . Protein, ur 05/17/2015 NEGATIVE  NEGATIVE mg/dL Final  . Nitrite 05/17/2015 NEGATIVE  NEGATIVE Final  . Leukocytes, UA 05/17/2015 LARGE* NEGATIVE Final  . Squamous Epithelial / LPF 05/17/2015 6-30* NONE SEEN Final  . WBC, UA 05/17/2015 6-30  0 - 5 WBC/hpf Final  . RBC / HPF 05/17/2015 0-5  0 - 5 RBC/hpf Final  . Bacteria, UA 05/17/2015 MANY* NONE SEEN Final  . Lactic Acid, Venous 05/17/2015 1.78  0.5 - 2.0 mmol/L Final  . Specimen Description 05/17/2015 URINE, CLEAN CATCH   Final  . Special Requests 05/17/2015 NONE   Final  . Culture 05/17/2015 MULTIPLE SPECIES PRESENT, SUGGEST RECOLLECTION   Final  . Report Status 05/17/2015 05/19/2015 FINAL   Final  . Specimen Description 05/17/2015 BLOOD RIGHT HAND   Final  . Special Requests 05/17/2015 BOTTLES DRAWN AEROBIC ONLY 6CC   Final  . Culture 05/17/2015 NO GROWTH 5 DAYS   Final  . Report Status 05/17/2015 05/22/2015 FINAL   Final  . Specimen Description 05/17/2015 BLOOD LEFT HAND   Final  . Special Requests 05/17/2015 BOTTLES DRAWN AEROBIC ONLY 10CC   Final  . Culture 05/17/2015 NO GROWTH 5 DAYS   Final  . Report Status 05/17/2015 05/22/2015 FINAL   Final  . Sodium 05/18/2015 139  135 - 145 mmol/L Final  . Potassium 05/18/2015 3.4* 3.5 - 5.1 mmol/L Final  . Chloride 05/18/2015 108  101 - 111 mmol/L Final  . CO2 05/18/2015 23  22 - 32 mmol/L Final  . Glucose, Bld 05/18/2015 74  65 - 99 mg/dL Final  . BUN 05/18/2015 31* 6 - 20 mg/dL Final  . Creatinine, Ser 05/18/2015  1.68* 0.44 - 1.00 mg/dL Final  . Calcium 05/18/2015 8.5* 8.9 - 10.3 mg/dL Final  . GFR calc non Af Amer 05/18/2015 28* >60 mL/min Final  . GFR calc Af Amer 05/18/2015 33* >60 mL/min Final   Comment: (NOTE) The eGFR has been calculated using the CKD EPI equation. This calculation has not been validated in all clinical situations. eGFR's persistently <60  mL/min signify possible Chronic Kidney Disease.   . Anion gap 05/18/2015 8  5 - 15 Final  . WBC 05/18/2015 8.5  4.0 - 10.5 K/uL Final  . RBC 05/18/2015 4.49  3.87 - 5.11 MIL/uL Final  . Hemoglobin 05/18/2015 13.1  12.0 - 15.0 g/dL Final  . HCT 05/18/2015 40.4  36.0 - 46.0 % Final  . MCV 05/18/2015 90.0  78.0 - 100.0 fL Final  . MCH 05/18/2015 29.2  26.0 - 34.0 pg Final  . MCHC 05/18/2015 32.4  30.0 - 36.0 g/dL Final  . RDW 05/18/2015 13.8  11.5 - 15.5 % Final  . Platelets 05/18/2015 132* 150 - 400 K/uL Final  . Magnesium 05/18/2015 1.6* 1.7 - 2.4 mg/dL Final  . Sodium 05/19/2015 139  135 - 145 mmol/L Final  . Potassium 05/19/2015 4.3  3.5 - 5.1 mmol/L Final   DELTA CHECK NOTED  . Chloride 05/19/2015 109  101 - 111 mmol/L Final  . CO2 05/19/2015 22  22 - 32 mmol/L Final  . Glucose, Bld 05/19/2015 94  65 - 99 mg/dL Final  . BUN 05/19/2015 30* 6 - 20 mg/dL Final  . Creatinine, Ser 05/19/2015 1.49* 0.44 - 1.00 mg/dL Final  . Calcium 05/19/2015 8.6* 8.9 - 10.3 mg/dL Final  . GFR calc non Af Amer 05/19/2015 33* >60 mL/min Final  . GFR calc Af Amer 05/19/2015 38* >60 mL/min Final   Comment: (NOTE) The eGFR has been calculated using the CKD EPI equation. This calculation has not been validated in all clinical situations. eGFR's persistently <60 mL/min signify possible Chronic Kidney Disease.   . Anion gap 05/19/2015 8  5 - 15 Final  . Sodium 05/20/2015 141  135 - 145 mmol/L Final  . Potassium 05/20/2015 4.0  3.5 - 5.1 mmol/L Final  . Chloride 05/20/2015 110  101 - 111 mmol/L Final  . CO2 05/20/2015 24  22 - 32 mmol/L Final  . Glucose, Bld 05/20/2015 95  65 - 99 mg/dL Final  . BUN 05/20/2015 18  6 - 20 mg/dL Final  . Creatinine, Ser 05/20/2015 1.12* 0.44 - 1.00 mg/dL Final  . Calcium 05/20/2015 8.9  8.9 - 10.3 mg/dL Final  . GFR calc non Af Amer 05/20/2015 46* >60 mL/min Final  . GFR calc Af Amer 05/20/2015 53* >60 mL/min Final   Comment: (NOTE) The eGFR has been calculated using  the CKD EPI equation. This calculation has not been validated in all clinical situations. eGFR's persistently <60 mL/min signify possible Chronic Kidney Disease.   . Anion gap 05/20/2015 7  5 - 15 Final  . Glucose-Capillary 05/20/2015 94  65 - 99 mg/dL Final    No results found.   Assessment/Plan   ICD-9-CM ICD-10-CM   1. Mixed Alzheimer's and vascular dementia 331.0 G30.9    294.10 F01.50    290.40 F02.80   2. Depression due to dementia 311 F32.9    294.10 F02.80   3. Essential hypertension, benign 401.1 I10   4. Hyperlipemia 272.4 E78.5   5. Loss of weight - improving 783.21 R63.4    STOP FOSAMAX  START  CALTRATE 1 TABLET DAILY  Continue other medications as ordered  Continue [facility] private duty and meals-on-wheels.   Return to office for lab in 1-2 weeks  DMV completed and to be faxed  Follow up in 1 month for routine visit  Modestine Scherzinger S. Perlie Gold  [facility] [address] [place], [place] [address] [phone] Cell (Monday-Friday 8 AM - 5 PM) [phone] After 5 PM and follow prompts

## 2015-08-08 ENCOUNTER — Other Ambulatory Visit: Payer: Medicare Other

## 2015-08-08 DIAGNOSIS — E878 Other disorders of electrolyte and fluid balance, not elsewhere classified: Secondary | ICD-10-CM

## 2015-08-09 LAB — BASIC METABOLIC PANEL
BUN / CREAT RATIO: 12 (ref 11–26)
BUN: 12 mg/dL (ref 8–27)
CALCIUM: 9.5 mg/dL (ref 8.7–10.3)
CO2: 25 mmol/L (ref 18–29)
Chloride: 105 mmol/L (ref 96–106)
Creatinine, Ser: 1 mg/dL (ref 0.57–1.00)
GFR, EST AFRICAN AMERICAN: 63 mL/min/{1.73_m2} (ref >59)
GFR, EST NON AFRICAN AMERICAN: 54 mL/min/{1.73_m2} — AB (ref >59)
Glucose: 88 mg/dL (ref 65–99)
POTASSIUM: 4.1 mmol/L (ref 3.5–5.2)
SODIUM: 148 mmol/L — AB (ref 134–144)

## 2015-08-17 ENCOUNTER — Encounter: Payer: Self-pay | Admitting: Internal Medicine

## 2015-08-21 ENCOUNTER — Other Ambulatory Visit: Payer: Self-pay | Admitting: Internal Medicine

## 2015-08-28 ENCOUNTER — Other Ambulatory Visit: Payer: Self-pay | Admitting: Internal Medicine

## 2015-08-31 ENCOUNTER — Ambulatory Visit: Payer: Self-pay | Admitting: Internal Medicine

## 2015-09-07 ENCOUNTER — Ambulatory Visit (INDEPENDENT_AMBULATORY_CARE_PROVIDER_SITE_OTHER): Payer: Medicare Other | Admitting: Internal Medicine

## 2015-09-07 ENCOUNTER — Encounter: Payer: Self-pay | Admitting: Internal Medicine

## 2015-09-07 VITALS — BP 120/68 | HR 74 | Temp 98.6°F | Resp 20 | Ht 67.0 in | Wt 129.2 lb

## 2015-09-07 DIAGNOSIS — R634 Abnormal weight loss: Secondary | ICD-10-CM | POA: Diagnosis not present

## 2015-09-07 DIAGNOSIS — F0150 Vascular dementia without behavioral disturbance: Secondary | ICD-10-CM | POA: Diagnosis not present

## 2015-09-07 DIAGNOSIS — F0280 Dementia in other diseases classified elsewhere without behavioral disturbance: Secondary | ICD-10-CM

## 2015-09-07 DIAGNOSIS — I10 Essential (primary) hypertension: Secondary | ICD-10-CM | POA: Diagnosis not present

## 2015-09-07 DIAGNOSIS — F0393 Unspecified dementia, unspecified severity, with mood disturbance: Secondary | ICD-10-CM

## 2015-09-07 DIAGNOSIS — F329 Major depressive disorder, single episode, unspecified: Secondary | ICD-10-CM

## 2015-09-07 DIAGNOSIS — F29 Unspecified psychosis not due to a substance or known physiological condition: Secondary | ICD-10-CM | POA: Diagnosis not present

## 2015-09-07 DIAGNOSIS — G309 Alzheimer's disease, unspecified: Secondary | ICD-10-CM

## 2015-09-07 MED ORDER — QUETIAPINE FUMARATE 100 MG PO TABS: ORAL_TABLET | ORAL | Status: DC

## 2015-09-07 NOTE — Patient Instructions (Signed)
Recommend Alzheimer's unit due to worsening dementia with behavioral disturbances and psychosis.   NO DRIVING!!  Increase seroquel 100mg  at bedtime  Continue othre medications as ordered  Continue meals-on-wheels  Continue personal care services with private duty nurse  Follow up in 1 month for routine visit

## 2015-09-07 NOTE — Progress Notes (Signed)
Patient ID: Carol [name], female   DOB: 12/29/1935, 80 y.o.   MRN: [id number]    Location:    [facility]   Place of Service:   OFFICE  Chief Complaint  Patient presents with  . Medical Management of Chronic Issues    1 month follow-up for routine visit  . OTHER    Daughters in room with patient    HPI:  80 yo female seen today for f/u.   Family has private duty care 2 times per week (Mon/Fri) where she gets bath and washes hair. Meals-on-wheels daily Mon- Fri. She lost 3 lbs  since last visit. She is still driving. She is hording paper towels, toilet paper, water and laundry detergent. She is taking namzaric and has exelon patch. She has behavioral disturbance and takes seroquel  HTN - takes amlodipine   Hyperlipidemia -takes lipitor  Osteopenia - not taking fosamax in several mos. Not on caltrate  Past Medical History  Diagnosis Date  . Cataract   . Rickets, late effect   . Other and unspecified hyperlipidemia   . Anxiety state, unspecified   . Tobacco use disorder   . Depressive disorder, not elsewhere classified   . Unspecified essential hypertension   . Memory loss   . Lumbago   . Unspecified constipation   . Varicose veins of lower extremities     Past Surgical History  Procedure Laterality Date  . Cyst excision Bilateral     behind ears  . Mouth surgery  04/12/2014    Patient Care Team: Kirt Boys, DO as PCP - General (Internal Medicine)  Social History   Social History  . Marital Status: Married    Spouse Name: N/A  . Number of Children: N/A  . Years of Education: N/A   Occupational History  . Not on file.   Social History Main Topics  . Smoking status: Current Every Day Smoker  . Smokeless tobacco: Never Used     Comment: No more than three cig daily   . Alcohol Use: No  . Drug Use: No  . Sexual Activity: Not on file   Other Topics Concern  . Not on file   Social History Narrative     reports that she has been smoking.  She has  never used smokeless tobacco. She reports that she does not drink alcohol or use illicit drugs.  Allergies  Allergen Reactions  . Penicillins     Rash & swelling    Medications: Patient's Medications  New Prescriptions   No medications on file  Previous Medications   ALENDRONATE (FOSAMAX) 70 MG TABLET    Take 1 tablet (70 mg total) by mouth every 7 (seven) days. Take with a full glass of water on an empty stomach.   AMLODIPINE (NORVASC) 5 MG TABLET    TAKE 1 TABLET BY MOUTH EVERY DAY   ATORVASTATIN (LIPITOR) 20 MG TABLET    TAKE 1 TABLET(20 MG) BY MOUTH DAILY   CALCIUM CARBONATE-VITAMIN D (CALTRATE 600+D) 600-400 MG-UNIT PER TABLET    Take 1 tablet by mouth 2 (two) times a week.   DESLORATADINE (CLARINEX) 5 MG TABLET    Take 1 tablet (5 mg total) by mouth daily. Take 1 tablet daily as needed for allergies.   MEMANTINE HCL-DONEPEZIL HCL (NAMZARIC) 28-10 MG CP24    Take 1 capsule by mouth daily.   MULTIPLE VITAMINS-MINERALS (MULTIVITAMIN WITH MINERALS) TABLET    Take 1 tablet by mouth daily.   POLYETHYLENE GLYCOL POWDER (GLYCOLAX/MIRALAX)  POWDER    Take 17 g by mouth daily.   QUETIAPINE (SEROQUEL) 50 MG TABLET    Take 1 tablet by mouth at bedtime   RIVASTIGMINE (EXELON) 13.3 MG/24HR PT24    Place 1 patch onto the skin daily. Rotate sites  Modified Medications   No medications on file  Discontinued Medications   No medications on file    Review of Systems  Unable to perform ROS: Dementia    Filed Vitals:   09/07/15 1618  BP: 120/68  Pulse: 74  Temp: 98.6 F (37 C)  TempSrc: Oral  Resp: 20  Height: 5\' 7"  (1.702 m)  Weight: 129 lb 3.2 oz (58.605 kg)  SpO2: 98%   Body mass index is 20.23 kg/(m^2).  Physical Exam  Constitutional: She appears well-developed.  Frail appearing in NAD  Cardiovascular: Normal rate, regular rhythm and intact distal pulses.  Exam reveals no gallop and no friction rub.   Murmur (1/6 SEM) heard. No LE edema b/l. No calf TTP  Pulmonary/Chest:  Effort normal and breath sounds normal. No respiratory distress. She has no wheezes. She has no rales. She exhibits no tenderness.  Neurological: She is alert.  Skin: Skin is warm and dry. No rash noted.  Psychiatric: She has a normal mood and affect. Her behavior is normal. Judgment and thought content normal.     Labs reviewed: Appointment on 08/08/2015  Component Date Value Ref Range Status  . Glucose 08/08/2015 88  65 - 99 mg/dL Final  . BUN 16/10/960403/06/2015 12  8 - 27 mg/dL Final  . Creatinine, Ser 08/08/2015 1.00  0.57 - 1.00 mg/dL Final  . GFR calc non Af Amer 08/08/2015 54* >59 mL/min/1.73 Final  . GFR calc Af Amer 08/08/2015 63  >59 mL/min/1.73 Final  . BUN/Creatinine Ratio 08/08/2015 12  11 - 26 Final  . Sodium 08/08/2015 148* 134 - 144 mmol/L Final  . Potassium 08/08/2015 4.1  3.5 - 5.2 mmol/L Final  . Chloride 08/08/2015 105  96 - 106 mmol/L Final  . CO2 08/08/2015 25  18 - 29 mmol/L Final  . Calcium 08/08/2015 9.5  8.7 - 10.3 mg/dL Final    No results found.   Assessment/Plan   ICD-9-CM ICD-10-CM   1. Mixed Alzheimer's and vascular dementia 331.0 G30.9 QUEtiapine (SEROQUEL) 100 MG tablet   294.10 F01.50    290.40 F02.80   2. Psychosis, unspecified psychosis type 298.9 F29   3. Essential hypertension, benign 401.1 I10   4. Depression due to dementia 311 F32.9    294.10 F02.80   5.     Weight loss due to #1  Recommend Alzheimer's unit due to worsening dementia with behavioral disturbances and psychosis.   NO DRIVING!! She is a harm to self and others. [facility]  Increase seroquel 100mg  at bedtime  Continue othre medications as ordered  Continue meals-on-wheels  Continue personal care services with private duty nurse  Follow up in 1 month for routine visit  Trameka Dorough S. Ancil Linseyarter, D. O., F. A. C. O. I.  [facility] [address] [place], [place] [address] [phone] Cell (Monday-Friday 8 AM - 5 PM) [phone]  After 5 PM and follow prompts

## 2015-09-26 ENCOUNTER — Other Ambulatory Visit: Payer: Self-pay | Admitting: Internal Medicine

## 2015-09-27 ENCOUNTER — Other Ambulatory Visit: Payer: Self-pay | Admitting: *Deleted

## 2015-09-27 MED ORDER — ATORVASTATIN CALCIUM 20 MG PO TABS: ORAL_TABLET | ORAL | Status: DC

## 2015-09-27 NOTE — Telephone Encounter (Signed)
Carol [name] Refill request

## 2015-10-05 ENCOUNTER — Encounter: Payer: Self-pay | Admitting: Internal Medicine

## 2015-10-05 ENCOUNTER — Ambulatory Visit (INDEPENDENT_AMBULATORY_CARE_PROVIDER_SITE_OTHER): Payer: Medicare Other | Admitting: Internal Medicine

## 2015-10-05 VITALS — BP 128/70 | HR 73 | Temp 98.6°F | Resp 20 | Ht 67.0 in | Wt 142.6 lb

## 2015-10-05 DIAGNOSIS — F17200 Nicotine dependence, unspecified, uncomplicated: Secondary | ICD-10-CM

## 2015-10-05 DIAGNOSIS — G309 Alzheimer's disease, unspecified: Secondary | ICD-10-CM

## 2015-10-05 DIAGNOSIS — F29 Unspecified psychosis not due to a substance or known physiological condition: Secondary | ICD-10-CM

## 2015-10-05 DIAGNOSIS — B372 Candidiasis of skin and nail: Secondary | ICD-10-CM

## 2015-10-05 DIAGNOSIS — F0150 Vascular dementia without behavioral disturbance: Secondary | ICD-10-CM | POA: Diagnosis not present

## 2015-10-05 DIAGNOSIS — N760 Acute vaginitis: Secondary | ICD-10-CM

## 2015-10-05 DIAGNOSIS — E870 Hyperosmolality and hypernatremia: Secondary | ICD-10-CM | POA: Diagnosis not present

## 2015-10-05 DIAGNOSIS — M8580 Other specified disorders of bone density and structure, unspecified site: Secondary | ICD-10-CM

## 2015-10-05 DIAGNOSIS — F0280 Dementia in other diseases classified elsewhere without behavioral disturbance: Secondary | ICD-10-CM | POA: Diagnosis not present

## 2015-10-05 MED ORDER — RIVASTIGMINE 13.3 MG/24HR TD PT24: MEDICATED_PATCH | TRANSDERMAL | Status: DC

## 2015-10-05 MED ORDER — ALENDRONATE SODIUM 70 MG PO TABS: 70.0000 mg | ORAL_TABLET | ORAL | Status: DC

## 2015-10-05 MED ORDER — FLUCONAZOLE 100 MG PO TABS: ORAL_TABLET | ORAL | Status: DC

## 2015-10-05 MED ORDER — METRONIDAZOLE 500 MG PO TABS: 500.0000 mg | ORAL_TABLET | Freq: Two times a day (BID) | ORAL | Status: DC

## 2015-10-05 NOTE — Progress Notes (Signed)
Patient ID: Carol [name], female   DOB: 1936-05-20, 80 y.o.   MRN: [id number]    Location:    [facility]   Place of Service:   OFFICE  Chief Complaint  Patient presents with  . Medical Management of Chronic Issues    1 month follow-up for routine visit  . OTHER    2 Daughters in room with patient    HPI:  80 yo female seen today for f/u. Two daughters present. She is a poor historian due to dementia. Hx obtained form chart and daughters. They report her behavior has improved on higher dose of seroquel. Dementia unchanged. She continues to have paranoia and has significant issues with short term memory. Family has private duty care 2 times per week (Mon/Fri) where she gets bath and washes hair. Meals-on-wheels daily Mon- Fri. She lost 3 lbs  since last visit. She is still driving as spouse does not stop her. She is hording paper towels, toilet paper, water and laundry detergent. She is taking namzaric and has exelon patch. She has gained 13 lbs since last OV. Na 148 last month  She also c/o itchy groin rash x unknown duration. She constantly scratches. No bleeding. no vaginal d/c  HTN - takes amlodipine   Hyperlipidemia -takes lipitor. LDL 75.  Osteopenia - not taking fosamax in several mos. Not on caltrate. Last DXA in 2015   Past Medical History  Diagnosis Date  . Cataract   . Rickets, late effect   . Other and unspecified hyperlipidemia   . Anxiety state, unspecified   . Tobacco use disorder   . Depressive disorder, not elsewhere classified   . Unspecified essential hypertension   . Memory loss   . Lumbago   . Unspecified constipation   . Varicose veins of lower extremities     Past Surgical History  Procedure Laterality Date  . Cyst excision Bilateral     behind ears  . Mouth surgery  04/12/2014    Patient Care Team: Kirt Boys, DO as PCP - General (Internal Medicine)  Social History   Social History  . Marital Status: Married    Spouse Name: N/A  .  Number of Children: N/A  . Years of Education: N/A   Occupational History  . Not on file.   Social History Main Topics  . Smoking status: Current Every Day Smoker  . Smokeless tobacco: Never Used     Comment: No more than three cig daily   . Alcohol Use: No  . Drug Use: No  . Sexual Activity: Not on file   Other Topics Concern  . Not on file   Social History Narrative     reports that she has been smoking.  She has never used smokeless tobacco. She reports that she does not drink alcohol or use illicit drugs.  Allergies  Allergen Reactions  . Penicillins     Rash & swelling    Medications: Patient's Medications  New Prescriptions   No medications on file  Previous Medications   ALENDRONATE (FOSAMAX) 70 MG TABLET    Take 1 tablet (70 mg total) by mouth every 7 (seven) days. Take with a full glass of water on an empty stomach.   AMLODIPINE (NORVASC) 5 MG TABLET    TAKE 1 TABLET BY MOUTH EVERY DAY   ATORVASTATIN (LIPITOR) 20 MG TABLET    Take one tablet by mouth once daily for cholesterol   CALCIUM CARBONATE-VITAMIN D (CALTRATE 600+D) 600-400 MG-UNIT PER TABLET  Take 1 tablet by mouth 2 (two) times a week.   DESLORATADINE (CLARINEX) 5 MG TABLET    Take 1 tablet (5 mg total) by mouth daily. Take 1 tablet daily as needed for allergies.   EXELON 13.3 MG/24HR PT24    PLACE 1 PATCH ON SKIN EVERY DAY, ROTATE SITES   MEMANTINE HCL-DONEPEZIL HCL (NAMZARIC) 28-10 MG CP24    Take 1 capsule by mouth daily.   MULTIPLE VITAMINS-MINERALS (MULTIVITAMIN WITH MINERALS) TABLET    Take 1 tablet by mouth daily.   POLYETHYLENE GLYCOL POWDER (GLYCOLAX/MIRALAX) POWDER    Take 17 g by mouth daily.   QUETIAPINE (SEROQUEL) 100 MG TABLET    Take 1 tablet by mouth at bedtime   RIVASTIGMINE (EXELON) 13.3 MG/24HR PT24    Place 1 patch onto the skin daily. Rotate sites  Modified Medications   No medications on file  Discontinued Medications   No medications on file    Review of Systems  Unable to  perform ROS: Dementia    Filed Vitals:   10/05/15 1513  BP: 128/70  Pulse: 73  Temp: 98.6 F (37 C)  TempSrc: Oral  Resp: 20  Height: 5\' 7"  (1.702 m)  Weight: 142 lb 9.6 oz (64.683 kg)  SpO2: 97%   Body mass index is 22.33 kg/(m^2).  Physical Exam  Constitutional: She appears well-developed.  Frail appearing in NAD  HENT:  Mouth/Throat: Oropharynx is clear and moist. No oropharyngeal exudate.  Eyes: Pupils are equal, round, and reactive to light. No scleral icterus.  Neck: Neck supple. Carotid bruit is not present.  Cardiovascular: Normal rate, regular rhythm and intact distal pulses.  Exam reveals no gallop and no friction rub.   Murmur (1/6 SEM) heard. Trace LE edema b/l. No calf TTP. Soft varicose veins b/l calf  Pulmonary/Chest: Effort normal and breath sounds normal. No respiratory distress. She has no wheezes. She has no rales. She exhibits no tenderness.  Abdominal: Soft. Bowel sounds are normal. She exhibits no distension. There is no hepatomegaly. There is no tenderness. There is no rebound and no guarding.  Genitourinary: Vaginal discharge (yellowish, no blood) found.  Lymphadenopathy:    She has no cervical adenopathy.  Neurological: She is alert.  Skin: Skin is warm and dry. Rash noted. Rash is not pustular, not vesicular and not urticarial. Nails show clubbing.     Psychiatric: She has a normal mood and affect. She is agitated.     Labs reviewed: Appointment on 08/08/2015  Component Date Value Ref Range Status  . Glucose 08/08/2015 88  65 - 99 mg/dL Final  . BUN 16/10/960403/06/2015 12  8 - 27 mg/dL Final  . Creatinine, Ser 08/08/2015 1.00  0.57 - 1.00 mg/dL Final  . GFR calc non Af Amer 08/08/2015 54* >59 mL/min/1.73 Final  . GFR calc Af Amer 08/08/2015 63  >59 mL/min/1.73 Final  . BUN/Creatinine Ratio 08/08/2015 12  11 - 26 Final  . Sodium 08/08/2015 148* 134 - 144 mmol/L Final  . Potassium 08/08/2015 4.1  3.5 - 5.2 mmol/L Final  . Chloride 08/08/2015 105  96 -  106 mmol/L Final  . CO2 08/08/2015 25  18 - 29 mmol/L Final  . Calcium 08/08/2015 9.5  8.7 - 10.3 mg/dL Final    No results found.   Assessment/Plan   ICD-9-CM ICD-10-CM   1. Cutaneous candidiasis 112.3 B37.2 fluconazole (DIFLUCAN) 100 MG tablet  2. Vaginitis and vulvovaginitis 616.10 N76.0 fluconazole (DIFLUCAN) 100 MG tablet     metroNIDAZOLE (FLAGYL)  500 MG tablet  3. Mixed Alzheimer's and vascular dementia 331.0 G30.9 Rivastigmine (EXELON) 13.3 MG/24HR PT24   294.10 F01.50 CMP   290.40 F02.80   4. Psychosis, unspecified psychosis type 298.9 F29 CMP  5. Tobacco use disorder 305.1 F17.200   6. Osteopenia 733.90 M85.80 alendronate (FOSAMAX) 70 MG tablet  7.      Hypernatremia - repeat lytes  Take diflucan  daily x 7 days  Take flagyl 2 times daily x 7 days  Keep groin as dry as possible  HOLD CHOLESTEROL PILL WHILE TAKING DIFLUCAN. May resume after completion  Continue current medications as ordered  Follow up in 1 month  Adiel Erney S. Ancil Linsey  [facility] [address] [place], [place] [address] [phone] Cell (Monday-Friday 8 AM - 5 PM) [phone] After 5 PM and follow prompts

## 2015-10-05 NOTE — Patient Instructions (Addendum)
Take diflucan 100mg  daily x 7 days  Take flagyl 2 times daily x 7 days  Keep groin as dry as possible  HOLD CHOLESTEROL PILL WHILE TAKING DIFLUCAN. May resume after completion  Continue current medications as ordered  Follow up in 1 month

## 2015-10-06 LAB — COMPREHENSIVE METABOLIC PANEL
ALBUMIN: 3.9 g/dL (ref 3.5–4.8)
ALK PHOS: 68 IU/L (ref 39–117)
ALT: 12 IU/L (ref 0–32)
AST: 19 IU/L (ref 0–40)
Albumin/Globulin Ratio: 1.2 (ref 1.2–2.2)
BILIRUBIN TOTAL: 0.2 mg/dL (ref 0.0–1.2)
BUN / CREAT RATIO: 17 (ref 12–28)
BUN: 14 mg/dL (ref 8–27)
CHLORIDE: 100 mmol/L (ref 96–106)
CO2: 26 mmol/L (ref 18–29)
Calcium: 9.2 mg/dL (ref 8.7–10.3)
Creatinine, Ser: 0.83 mg/dL (ref 0.57–1.00)
GFR calc non Af Amer: 68 mL/min/{1.73_m2} (ref >59)
GFR, EST AFRICAN AMERICAN: 79 mL/min/{1.73_m2} (ref >59)
GLOBULIN, TOTAL: 3.2 g/dL (ref 1.5–4.5)
GLUCOSE: 89 mg/dL (ref 65–99)
Potassium: 4.3 mmol/L (ref 3.5–5.2)
SODIUM: 141 mmol/L (ref 134–144)
TOTAL PROTEIN: 7.1 g/dL (ref 6.0–8.5)

## 2015-11-14 ENCOUNTER — Encounter: Payer: Self-pay | Admitting: Internal Medicine

## 2015-11-14 ENCOUNTER — Ambulatory Visit (INDEPENDENT_AMBULATORY_CARE_PROVIDER_SITE_OTHER): Payer: Medicare Other | Admitting: Internal Medicine

## 2015-11-14 VITALS — BP 138/86 | HR 77 | Temp 98.4°F | Ht 67.0 in | Wt 133.2 lb

## 2015-11-14 DIAGNOSIS — I10 Essential (primary) hypertension: Secondary | ICD-10-CM

## 2015-11-14 DIAGNOSIS — F0150 Vascular dementia without behavioral disturbance: Secondary | ICD-10-CM

## 2015-11-14 DIAGNOSIS — F0280 Dementia in other diseases classified elsewhere without behavioral disturbance: Secondary | ICD-10-CM | POA: Diagnosis not present

## 2015-11-14 DIAGNOSIS — B372 Candidiasis of skin and nail: Secondary | ICD-10-CM | POA: Diagnosis not present

## 2015-11-14 DIAGNOSIS — G309 Alzheimer's disease, unspecified: Secondary | ICD-10-CM

## 2015-11-14 DIAGNOSIS — F29 Unspecified psychosis not due to a substance or known physiological condition: Secondary | ICD-10-CM | POA: Diagnosis not present

## 2015-11-14 DIAGNOSIS — R634 Abnormal weight loss: Secondary | ICD-10-CM | POA: Diagnosis not present

## 2015-11-14 DIAGNOSIS — F17200 Nicotine dependence, unspecified, uncomplicated: Secondary | ICD-10-CM

## 2015-11-14 MED ORDER — FLUCONAZOLE 100 MG PO TABS: ORAL_TABLET | ORAL | Status: DC

## 2015-11-14 MED ORDER — ZOSTER VACCINE LIVE 19400 UNT/0.65ML ~~LOC~~ SUSR: 0.6500 mL | Freq: Once | SUBCUTANEOUS | Status: DC

## 2015-11-14 NOTE — Patient Instructions (Addendum)
Recommend smoking cessation   Continue current medications as ordered  Start diflucan. Hold lipitor while on diflucan  Keep groin as dry as possible. wear loose fitting clothing  Follow up in 1 month for routine visit

## 2015-11-14 NOTE — Progress Notes (Signed)
Patient ID: Carol [name], female   DOB: Jul 24, 1935, 80 y.o.   MRN: [id number]    Location:  [facility] Place of Service: OFFICE  Chief Complaint  Patient presents with  . Medication Management    HPI:   80 yo female seen today for f/u. Two daughters present. She is a poor historian due to dementia. Hx obtained form chart and daughters. They report her behavior has improved on higher dose of seroquel. Dementia unchanged. She continues to have paranoia and has significant issues with short term memory. Family has private duty care 2 times per week (Mon/Fri) where she gets bath and washes hair. Meals-on-wheels daily Mon- Fri. She is still driving as spouse does not stop her. She is hording paper towels, toilet paper, water and laundry detergent. She is taking namzaric and has exelon patch. She is down 9 lbs since last OV. Na 141 last month. She is more agitated today than . She had a birthday last month but does not recall how old she is.  Cutaneous candidiasis - tx with diflucan and flagyl. Family believes improved but she is still scratching her groin  HTN - takes amlodipine   Hyperlipidemia -takes lipitor. LDL 75.  Osteopenia - not taking fosamax in several mos. Not on caltrate. Last DXA in 2015  Tobacco abuse - chain smoking with frail spouse now.   Past Medical History  Diagnosis Date  . Cataract   . Rickets, late effect   . Other and unspecified hyperlipidemia   . Anxiety state, unspecified   . Tobacco use disorder   . Depressive disorder, not elsewhere classified   . Unspecified essential hypertension   . Memory loss   . Lumbago   . Unspecified constipation   . Varicose veins of lower extremities     Past Surgical History  Procedure Laterality Date  . Cyst excision Bilateral     behind ears  . Mouth surgery  04/12/2014    Patient Care Team: Kirt BoysMonica Sanjuanita Condrey, DO as PCP - General (Internal Medicine)  Social History   Social History  . Marital Status: Married   Spouse Name: N/A  . Number of Children: N/A  . Years of Education: N/A   Occupational History  . Not on file.   Social History Main Topics  . Smoking status: Current Every Day Smoker  . Smokeless tobacco: Never Used     Comment: No more than three cig daily   . Alcohol Use: No  . Drug Use: No  . Sexual Activity: Not on file   Other Topics Concern  . Not on file   Social History Narrative     reports that she has been smoking.  She has never used smokeless tobacco. She reports that she does not drink alcohol or use illicit drugs.  Family History  Problem Relation Age of Onset  . Heart disease Father    Family Status  Relation Status Death Age  . Father Deceased 4466    heart disease  . Mother Deceased 6692    old age  . Daughter Alive   . Daughter Alive   . Daughter Alive   . Daughter Alive      Allergies  Allergen Reactions  . Penicillins     Rash & swelling    Medications: Patient's Medications  New Prescriptions   No medications on file  Previous Medications   ALENDRONATE (FOSAMAX) 70 MG TABLET    Take 1 tablet (70 mg total) by mouth every 7 (seven)  days. Take with a full glass of water on an empty stomach.   AMLODIPINE (NORVASC) 5 MG TABLET    TAKE 1 TABLET BY MOUTH EVERY DAY   ATORVASTATIN (LIPITOR) 20 MG TABLET    Take one tablet by mouth once daily for cholesterol   CALCIUM CARBONATE-VITAMIN D (CALTRATE 600+D) 600-400 MG-UNIT PER TABLET    Take 1 tablet by mouth 2 (two) times a week.   DESLORATADINE (CLARINEX) 5 MG TABLET    Take 1 tablet (5 mg total) by mouth daily. Take 1 tablet daily as needed for allergies.   FLUCONAZOLE (DIFLUCAN) 100 MG TABLET    Take 2 tablets on day 1 then take 1 tablet daily x 6 days   MEMANTINE HCL-DONEPEZIL HCL (NAMZARIC) 28-10 MG CP24    Take 1 capsule by mouth daily.   METRONIDAZOLE (FLAGYL) 500 MG TABLET    Take 1 tablet (500 mg total) by mouth 2 (two) times daily.   MULTIPLE VITAMINS-MINERALS (MULTIVITAMIN WITH MINERALS)  TABLET    Take 1 tablet by mouth daily.   POLYETHYLENE GLYCOL POWDER (GLYCOLAX/MIRALAX) POWDER    Take 17 g by mouth daily.   QUETIAPINE (SEROQUEL) 100 MG TABLET    Take 1 tablet by mouth at bedtime   RIVASTIGMINE (EXELON) 13.3 MG/24HR PT24    Place 1 patch onto the skin daily. Rotate sites   RIVASTIGMINE (EXELON) 13.3 MG/24HR PT24    PLACE 1 PATCH ON SKIN EVERY DAY, ROTATE SITES  Modified Medications   Modified Medication Previous Medication   ZOSTER VACCINE LIVE, PF, (ZOSTAVAX) 69629 UNT/0.65ML INJECTION Zoster Vaccine Live, PF, (ZOSTAVAX) 52841 UNT/0.65ML injection      Inject 19,400 Units into the skin once.    Inject 0.65 mLs into the skin once.  Discontinued Medications   No medications on file    Review of Systems  Unable to perform ROS: Dementia    Filed Vitals:   11/14/15 1540  BP: 138/86  Pulse: 77  Temp: 98.4 F (36.9 C)  TempSrc: Oral  Height: 5\' 7"  (1.702 m)  Weight: 133 lb 3.2 oz (60.419 kg)  SpO2: 97%   Body mass index is 20.86 kg/(m^2).  Physical Exam  Constitutional: She appears well-developed.  Frail appearing in NAD  HENT:  Mouth/Throat: Oropharynx is clear and moist. No oropharyngeal exudate.  Eyes: Pupils are equal, round, and reactive to light. No scleral icterus.  Neck: Neck supple. Carotid bruit is not present.  Cardiovascular: Normal rate, regular rhythm and intact distal pulses.  Exam reveals no gallop and no friction rub.   Murmur (1/6 SEM) heard. Trace LE edema b/l. No calf TTP. Soft varicose veins b/l calf  Pulmonary/Chest: Effort normal. No respiratory distress. She has wheezes (end expiratory b/l). She has no rales. She exhibits no tenderness.  Abdominal: Soft. Bowel sounds are normal. She exhibits no distension. There is no hepatomegaly. There is no tenderness. There is no rebound and no guarding.  Genitourinary: No vaginal discharge found.  Lymphadenopathy:    She has no cervical adenopathy.  Neurological: She is alert.  Skin: Skin is  warm and dry. Rash noted. Rash is not pustular, not vesicular and not urticarial. Nails show clubbing.     Psychiatric: She has a normal mood and affect. She is agitated.     Labs reviewed: Office Visit on 10/05/2015  Component Date Value Ref Range Status  . Glucose 10/05/2015 89  65 - 99 mg/dL Final  . BUN 32/44/0102 14  8 - 27 mg/dL Final  .  Creatinine, Ser 10/05/2015 0.83  0.57 - 1.00 mg/dL Final  . GFR calc non Af Amer 10/05/2015 68  >59 mL/min/1.73 Final  . GFR calc Af Amer 10/05/2015 79  >59 mL/min/1.73 Final  . BUN/Creatinine Ratio 10/05/2015 17  12 - 28 Final  . Sodium 10/05/2015 141  134 - 144 mmol/L Final  . Potassium 10/05/2015 4.3  3.5 - 5.2 mmol/L Final  . Chloride 10/05/2015 100  96 - 106 mmol/L Final  . CO2 10/05/2015 26  18 - 29 mmol/L Final  . Calcium 10/05/2015 9.2  8.7 - 10.3 mg/dL Final  . Total Protein 10/05/2015 7.1  6.0 - 8.5 g/dL Final  . Albumin 40/98/1191 3.9  3.5 - 4.8 g/dL Final  . Globulin, Total 10/05/2015 3.2  1.5 - 4.5 g/dL Final  . Albumin/Globulin Ratio 10/05/2015 1.2  1.2 - 2.2 Final  . Bilirubin Total 10/05/2015 0.2  0.0 - 1.2 mg/dL Final  . Alkaline Phosphatase 10/05/2015 68  39 - 117 IU/L Final  . AST 10/05/2015 19  0 - 40 IU/L Final  . ALT 10/05/2015 12  0 - 32 IU/L Final    No results found.   Assessment/Plan   ICD-9-CM ICD-10-CM   1. Cutaneous candidiasis 112.3 B37.2 fluconazole (DIFLUCAN) 100 MG tablet  2. Mixed Alzheimer's and vascular dementia - worsening 331.0 G30.9    294.10 F01.50    290.40 F02.80   3. Psychosis, unspecified psychosis type 298.9 F29   4. Essential hypertension, benign 401.1 I10   5. Tobacco use disorder 305.1 F17.200   6.      Loss of weight - worsening due to #2  Her dementia is worsening and may need SNF/Alzheimer's unit. Family will continue to research sites and get back to me with their decision  Recommend smoking cessation   Continue current medications as ordered  Start diflucan. Hold lipitor  while on diflucan  Keep groin as dry as possible. wear loose fitting clothing  Follow up in 1 month for routine visit   Girard Koontz S. Ancil Linsey  [facility] and [facility] [address] [place], [place] [address] [phone] Cell (Monday-Friday 8 AM - 5 PM) [phone] After 5 PM and follow prompts

## 2015-12-14 ENCOUNTER — Ambulatory Visit (INDEPENDENT_AMBULATORY_CARE_PROVIDER_SITE_OTHER): Payer: Medicare Other | Admitting: Internal Medicine

## 2015-12-14 ENCOUNTER — Encounter: Payer: Self-pay | Admitting: Internal Medicine

## 2015-12-14 VITALS — BP 150/50 | HR 79 | Temp 98.9°F | Resp 20 | Ht 67.0 in | Wt 140.4 lb

## 2015-12-14 DIAGNOSIS — F17200 Nicotine dependence, unspecified, uncomplicated: Secondary | ICD-10-CM | POA: Diagnosis not present

## 2015-12-14 DIAGNOSIS — F0280 Dementia in other diseases classified elsewhere without behavioral disturbance: Secondary | ICD-10-CM

## 2015-12-14 DIAGNOSIS — B372 Candidiasis of skin and nail: Secondary | ICD-10-CM

## 2015-12-14 DIAGNOSIS — F29 Unspecified psychosis not due to a substance or known physiological condition: Secondary | ICD-10-CM

## 2015-12-14 DIAGNOSIS — G309 Alzheimer's disease, unspecified: Secondary | ICD-10-CM | POA: Diagnosis not present

## 2015-12-14 DIAGNOSIS — F0150 Vascular dementia without behavioral disturbance: Secondary | ICD-10-CM

## 2015-12-14 MED ORDER — NYSTATIN 100000 UNIT/GM EX POWD
Freq: Two times a day (BID) | CUTANEOUS | Status: DC
Start: 2015-12-14 — End: 2016-10-29

## 2015-12-14 MED ORDER — QUETIAPINE FUMARATE 100 MG PO TABS: ORAL_TABLET | ORAL | Status: DC

## 2015-12-14 NOTE — Patient Instructions (Signed)
Increase seroquel to 1/2 tab in AM and 1 tab at bedtime  Start nystatin powder apply 2 times daily if possible to groin and abdomen area  Follow up in 1 month for rash and memory  [facility]

## 2015-12-14 NOTE — Progress Notes (Signed)
Patient ID: Carol [name], female   DOB: 1935-06-20, 80 y.o.   MRN: [id number]    Location:  [facility] Place of Service: OFFICE  Chief Complaint  Patient presents with  . Medical Management of Chronic Issues    HPI:  80 yo female seen today for f/u. Youngest daughter present. She is a poor historian due to dementia. Hx obtained form chart and daughters. They report her behavior has improved on higher dose of seroquel. Dementia unchanged. She continues to have paranoia and has significant issues with short term memory. Family has private duty care 2 times per week (Mon/Fri) where she gets bath and washes hair. Meals-on-wheels daily Mon- Fri. She is still driving as spouse does not stop her. She is hording paper towels, toilet paper, water and laundry detergent. She is taking namzaric and has exelon patch. Weight is up 7 lbs since last OV. Na 141. She is more agitated today than usual. She is not driving  Cutaneous candidiasis - tx with diflucan and flagyl. Family believes improved but she is still scratching her groin  HTN - takes amlodipine. BP creeping up most likely related to chain smoking.  Hyperlipidemia -takes lipitor. LDL 75.  Osteopenia - taking fosamax. Not on caltrate. Last DXA in 2015  Tobacco abuse - chain smoking with frail spouse now.  Past Medical History  Diagnosis Date  . Cataract   . Rickets, late effect   . Other and unspecified hyperlipidemia   . Anxiety state, unspecified   . Tobacco use disorder   . Depressive disorder, not elsewhere classified   . Unspecified essential hypertension   . Memory loss   . Lumbago   . Unspecified constipation   . Varicose veins of lower extremities     Past Surgical History  Procedure Laterality Date  . Cyst excision Bilateral     behind ears  . Mouth surgery  04/12/2014    Patient Care Team: Kirt BoysMonica Marne Meline, DO as PCP - General (Internal Medicine)  Social History   Social History  . Marital Status: Married   Spouse Name: N/A  . Number of Children: N/A  . Years of Education: N/A   Occupational History  . Not on file.   Social History Main Topics  . Smoking status: Current Every Day Smoker  . Smokeless tobacco: Never Used     Comment: No more than three cig daily   . Alcohol Use: No  . Drug Use: No  . Sexual Activity: Not on file   Other Topics Concern  . Not on file   Social History Narrative     reports that she has been smoking.  She has never used smokeless tobacco. She reports that she does not drink alcohol or use illicit drugs.  Family History  Problem Relation Age of Onset  . Heart disease Father    Family Status  Relation Status Death Age  . Father Deceased 5566    heart disease  . Mother Deceased 1092    old age  . Daughter Alive   . Daughter Alive   . Daughter Alive   . Daughter Alive      Allergies  Allergen Reactions  . Penicillins     Rash & swelling    Medications: Patient's Medications  New Prescriptions   No medications on file  Previous Medications   ALENDRONATE (FOSAMAX) 70 MG TABLET    Take 1 tablet (70 mg total) by mouth every 7 (seven) days. Take with a full glass of  water on an empty stomach.   AMLODIPINE (NORVASC) 5 MG TABLET    TAKE 1 TABLET BY MOUTH EVERY DAY   ATORVASTATIN (LIPITOR) 20 MG TABLET    Take one tablet by mouth once daily for cholesterol   CALCIUM CARBONATE-VITAMIN D (CALTRATE 600+D) 600-400 MG-UNIT PER TABLET    Take 1 tablet by mouth 2 (two) times a week.   DESLORATADINE (CLARINEX) 5 MG TABLET    Take 1 tablet (5 mg total) by mouth daily. Take 1 tablet daily as needed for allergies.   FLUCONAZOLE (DIFLUCAN) 100 MG TABLET    Take 2 tablets on day 1 then take 1 tablet daily x 6 days   MEMANTINE HCL-DONEPEZIL HCL (NAMZARIC) 28-10 MG CP24    Take 1 capsule by mouth daily.   MULTIPLE VITAMINS-MINERALS (MULTIVITAMIN WITH MINERALS) TABLET    Take 1 tablet by mouth daily.   POLYETHYLENE GLYCOL POWDER (GLYCOLAX/MIRALAX) POWDER    Take  17 g by mouth daily.   QUETIAPINE (SEROQUEL) 100 MG TABLET    Take 1 tablet by mouth at bedtime   RIVASTIGMINE (EXELON) 13.3 MG/24HR PT24    PLACE 1 PATCH ON SKIN EVERY DAY, ROTATE SITES   ZOSTER VACCINE LIVE, PF, (ZOSTAVAX) [address] UNT/0.65ML INJECTION    Inject 19,400 Units into the skin once.  Modified Medications   No medications on file  Discontinued Medications   METRONIDAZOLE (FLAGYL) 500 MG TABLET    Take 1 tablet (500 mg total) by mouth 2 (two) times daily.   RIVASTIGMINE (EXELON) 13.3 MG/24HR PT24    Place 1 patch onto the skin daily. Rotate sites    Review of Systems  Unable to perform ROS: Dementia    Filed Vitals:   12/14/15 1545  BP: 150/50  Pulse: 79  Temp: 98.9 F (37.2 C)  TempSrc: Oral  Resp: 20  Height:  (1.702 m)  Weight: 140 lb 6.4 oz (63.685 kg)  SpO2: 96%   Body mass index is 21.98 kg/(m^2).  Physical Exam  Constitutional: She appears well-developed.  Frail appearing in NAD  HENT:  Mouth/Throat: Oropharynx is clear and moist. No oropharyngeal exudate.  Eyes: Pupils are equal, round, and reactive to light. No scleral icterus.  Neck: Normal range of motion. Neck supple.  No carotid bruit b/l  Cardiovascular: Normal rate, regular rhythm and intact distal pulses.  Exam reveals no gallop and no friction rub.   Murmur (1/6 SEM) heard. No LE edema b/l. No calf TTP  Pulmonary/Chest: Effort normal and breath sounds normal. No respiratory distress. She has no wheezes. She has no rales. She exhibits no tenderness.  Lymphadenopathy:    She has no cervical adenopathy.  Neurological: She is alert.  Skin: Skin is warm and dry. Rash (R>L inguinal dark with redness, min maceration, moist) noted.  Psychiatric: Her mood appears anxious. She is agitated. Thought content is delusional.     Labs reviewed: Office Visit on 10/05/2015  Component Date Value Ref Range Status  . Glucose 10/05/2015 89  65 - 99 mg/dL Final  . BUN 60/45/4098 14  8 - 27 mg/dL Final  .  Creatinine, Ser 10/05/2015 0.83  0.57 - 1.00 mg/dL Final  . GFR calc non Af Amer 10/05/2015 68  >59 mL/min/1.73 Final  . GFR calc Af Amer 10/05/2015 79  >59 mL/min/1.73 Final  . BUN/Creatinine Ratio 10/05/2015 17  12 - 28 Final  . Sodium 10/05/2015 141  134 - 144 mmol/L Final  . Potassium 10/05/2015 4.3  3.5 - 5.2  mmol/L Final  . Chloride 10/05/2015 100  96 - 106 mmol/L Final  . CO2 10/05/2015 26  18 - 29 mmol/L Final  . Calcium 10/05/2015 9.2  8.7 - 10.3 mg/dL Final  . Total Protein 10/05/2015 7.1  6.0 - 8.5 g/dL Final  . Albumin 65/78/469604/28/2017 3.9  3.5 - 4.8 g/dL Final  . Globulin, Total 10/05/2015 3.2  1.5 - 4.5 g/dL Final  . Albumin/Globulin Ratio 10/05/2015 1.2  1.2 - 2.2 Final  . Bilirubin Total 10/05/2015 0.2  0.0 - 1.2 mg/dL Final  . Alkaline Phosphatase 10/05/2015 68  39 - 117 IU/L Final  . AST 10/05/2015 19  0 - 40 IU/L Final  . ALT 10/05/2015 12  0 - 32 IU/L Final    No results found.   Assessment/Plan   ICD-9-CM ICD-10-CM   1. Mixed Alzheimer's and vascular dementia 331.0 G30.9 QUEtiapine (SEROQUEL) 100 MG tablet   294.10 F01.50    290.40 F02.80   2. Psychosis, unspecified psychosis type 298.9 F29   3. Cutaneous candidiasis 112.3 B37.2 nystatin (MYCOSTATIN/NYSTOP) powder  4. Tobacco use disorder 305.1 F17.200     Will need labs next OV (CMP)  Increase seroquel to 1/2 tab in AM and 1 tab at bedtime  Start nystatin powder apply 2 times daily if possible to groin and abdomen area  Follow up in 1 month for rash and memory  Smoking cessation discussed and highly urged   Addi Pak S. Ancil Linseyarter, D. O., F. A. C. O. I.  [facility] [address] [place], [place] [id number] [phone] Cell (Monday-Friday 8 AM - 5 PM) [phone] After 5 PM and follow prompts

## 2016-01-06 ENCOUNTER — Other Ambulatory Visit: Payer: Self-pay | Admitting: Internal Medicine

## 2016-01-06 DIAGNOSIS — F0280 Dementia in other diseases classified elsewhere without behavioral disturbance: Principal | ICD-10-CM

## 2016-01-06 DIAGNOSIS — F0150 Vascular dementia without behavioral disturbance: Secondary | ICD-10-CM

## 2016-01-06 DIAGNOSIS — G309 Alzheimer's disease, unspecified: Principal | ICD-10-CM

## 2016-01-17 ENCOUNTER — Other Ambulatory Visit: Payer: Self-pay | Admitting: Internal Medicine

## 2016-02-01 ENCOUNTER — Ambulatory Visit (INDEPENDENT_AMBULATORY_CARE_PROVIDER_SITE_OTHER): Payer: Medicare Other | Admitting: Internal Medicine

## 2016-02-01 ENCOUNTER — Encounter: Payer: Self-pay | Admitting: Internal Medicine

## 2016-02-01 VITALS — BP 132/78 | HR 65 | Temp 98.1°F | Ht 67.0 in | Wt 138.0 lb

## 2016-02-01 DIAGNOSIS — F0280 Dementia in other diseases classified elsewhere without behavioral disturbance: Secondary | ICD-10-CM

## 2016-02-01 DIAGNOSIS — B372 Candidiasis of skin and nail: Secondary | ICD-10-CM | POA: Diagnosis not present

## 2016-02-01 DIAGNOSIS — E785 Hyperlipidemia, unspecified: Secondary | ICD-10-CM

## 2016-02-01 DIAGNOSIS — G309 Alzheimer's disease, unspecified: Secondary | ICD-10-CM

## 2016-02-01 DIAGNOSIS — I10 Essential (primary) hypertension: Secondary | ICD-10-CM | POA: Diagnosis not present

## 2016-02-01 DIAGNOSIS — F0150 Vascular dementia without behavioral disturbance: Secondary | ICD-10-CM | POA: Diagnosis not present

## 2016-02-01 DIAGNOSIS — Z23 Encounter for immunization: Secondary | ICD-10-CM

## 2016-02-01 NOTE — Patient Instructions (Signed)
Continue current medications as ordered  Will call with lab results  Follow up in 1 month for routine visit

## 2016-02-01 NOTE — Progress Notes (Signed)
Patient ID: Carol [name], female   DOB: [birth year], 80 y.o.   MRN: [id number]    Location:  [facility] Place of Service: OFFICE  Chief Complaint  Patient presents with  . Follow-up    follow up for rash and memory  . Other    discuss flu vacc.    HPI:  80 yo female seen today for f/u. She reports no changes. Weight down 2 lbs. Appetite good. No constipation.   Youngest daughter present. She is a poor historian due to dementia. Hx obtained form chart and daughter. Her behavior has improved on higher dose of seroquel. Dementia unchanged. She continues to have paranoia and has significant issues with short term memory. Family has private duty care 2 times per week (Mon/Fri) where she gets bath and washes hair. Meals-on-wheels daily Mon- Fri. She is still driving as spouse does not stop her. She is hording paper towels, toilet paper, water and laundry detergent. She is taking namzaric and has exelon patch. Na 141. She is not driving  Cutaneous candidiasis - tx with po diflucan and flagyl. Family believes improved.   HTN - takes amlodipine. BP creeping up most likely related to chain smoking.  Hyperlipidemia -takes lipitor. LDL 75.  Osteopenia - taking fosamax. Not on caltrate. Last DXA in 2015  Tobacco abuse - chain smoking with frail spouse now.  Past Medical History:  Diagnosis Date  . Anxiety state, unspecified   . Cataract   . Depressive disorder, not elsewhere classified   . Lumbago   . Memory loss   . Other and unspecified hyperlipidemia   . Rickets, late effect   . Tobacco use disorder   . Unspecified constipation   . Unspecified essential hypertension   . Varicose veins of lower extremities     Past Surgical History:  Procedure Laterality Date  . CYST EXCISION Bilateral    behind ears  . MOUTH SURGERY  04/12/2014    Patient Care Team: Gildardo Cranker, DO as PCP - General (Internal Medicine)  Social History   Social History  . Marital status: Married   Spouse name: N/A  . Number of children: N/A  . Years of education: N/A   Occupational History  . Not on file.   Social History Main Topics  . Smoking status: Current Every Day Smoker  . Smokeless tobacco: Never Used     Comment: No more than three cig daily   . Alcohol use No  . Drug use: No  . Sexual activity: Not on file   Other Topics Concern  . Not on file   Social History Narrative  . No narrative on file     reports that she has been smoking.  She has never used smokeless tobacco. She reports that she does not drink alcohol or use drugs.  Family History  Problem Relation Age of Onset  . Heart disease Father    Family Status  Relation Status  . Father Deceased at age 53   heart disease  . Mother Deceased at age 23   old age  . Daughter Alive  . Daughter Alive  . Daughter Alive  . Daughter Alive     Allergies  Allergen Reactions  . Penicillins     Rash & swelling    Medications: Patient's Medications  New Prescriptions   No medications on file  Previous Medications   ALENDRONATE (FOSAMAX) 70 MG TABLET    Take 1 tablet (70 mg total) by mouth every 7 (seven) days.  Take with a full glass of water on an empty stomach.   AMLODIPINE (NORVASC) 5 MG TABLET    TAKE 1 TABLET BY MOUTH EVERY DAY   ATORVASTATIN (LIPITOR) 20 MG TABLET    Take one tablet by mouth once daily for cholesterol   CALCIUM CARBONATE-VITAMIN D (CALTRATE 600+D) 600-400 MG-UNIT PER TABLET    Take 1 tablet by mouth 2 (two) times a week.   DESLORATADINE (CLARINEX) 5 MG TABLET    Take 1 tablet (5 mg total) by mouth daily. Take 1 tablet daily as needed for allergies.   FLUCONAZOLE (DIFLUCAN) 100 MG TABLET    Take 2 tablets on day 1 then take 1 tablet daily x 6 days   MULTIPLE VITAMINS-MINERALS (MULTIVITAMIN WITH MINERALS) TABLET    Take 1 tablet by mouth daily.   NAMZARIC 28-10 MG CP24    TAKE 1 CAPSULE BY MOUTH DAILY   NYSTATIN (MYCOSTATIN/NYSTOP) POWDER    Apply topically 2 (two) times daily. To  groin and abdomen area   POLYETHYLENE GLYCOL POWDER (GLYCOLAX/MIRALAX) POWDER    Take 17 g by mouth daily.   QUETIAPINE (SEROQUEL) 100 MG TABLET    Take 0.5 tab po qAM and 1 ablet by mouth at bedtime   RIVASTIGMINE (EXELON) 13.3 MG/24HR PT24    PLACE 1 PATCH ON SKIN EVERY DAY, ROTATE SITES  Modified Medications   No medications on file  Discontinued Medications   ZOSTER VACCINE LIVE, PF, (ZOSTAVAX) 34287 UNT/0.65ML INJECTION    Inject 19,400 Units into the skin once.    Review of Systems  Unable to perform ROS: Dementia    Vitals:   02/01/16 1140  BP: 132/78  Pulse: 65  Temp: 98.1 F (36.7 C)  TempSrc: Oral  SpO2: 97%  Weight: 138 lb (62.6 kg)  Height: 5' 7"  (1.702 m)   Body mass index is 21.61 kg/m.  Physical Exam  Constitutional: She appears well-developed.  Frail appearing in NAD  HENT:  Mouth/Throat: Oropharynx is clear and moist. No oropharyngeal exudate.  Eyes: Pupils are equal, round, and reactive to light. No scleral icterus.  Neck: Normal range of motion. Neck supple.  No carotid bruit b/l  Cardiovascular: Normal rate, regular rhythm and intact distal pulses.  Exam reveals no gallop and no friction rub.   Murmur (1/6 SEM) heard. No LE edema b/l. No calf TTP  Pulmonary/Chest: Effort normal and breath sounds normal. No respiratory distress. She has no wheezes. She has no rales. She exhibits no tenderness.  Lymphadenopathy:    She has no cervical adenopathy.  Neurological: She is alert.  Skin: Skin is warm and dry. Rash (R>L inguinal dark with less redness and no maceration, dry) noted.  Psychiatric: Her mood appears anxious. She is not agitated. Thought content is delusional.     Labs reviewed: No visits with results within 3 Month(s) from this visit.  Latest known visit with results is:  Office Visit on 10/05/2015  Component Date Value Ref Range Status  . Glucose 10/06/2015 89  65 - 99 mg/dL Final  . BUN 10/06/2015 14  8 - 27 mg/dL Final  . Creatinine,  Ser 10/06/2015 0.83  0.57 - 1.00 mg/dL Final  . GFR calc non Af Amer 10/06/2015 68  >59 mL/min/1.73 Final  . GFR calc Af Amer 10/06/2015 79  >59 mL/min/1.73 Final  . BUN/Creatinine Ratio 10/06/2015 17  12 - 28 Final  . Sodium 10/06/2015 141  134 - 144 mmol/L Final  . Potassium 10/06/2015 4.3  3.5 -  5.2 mmol/L Final  . Chloride 10/06/2015 100  96 - 106 mmol/L Final  . CO2 10/06/2015 26  18 - 29 mmol/L Final  . Calcium 10/06/2015 9.2  8.7 - 10.3 mg/dL Final  . Total Protein 10/06/2015 7.1  6.0 - 8.5 g/dL Final  . Albumin 10/06/2015 3.9  3.5 - 4.8 g/dL Final  . Globulin, Total 10/06/2015 3.2  1.5 - 4.5 g/dL Final  . Albumin/Globulin Ratio 10/06/2015 1.2  1.2 - 2.2 Final  . Bilirubin Total 10/06/2015 0.2  0.0 - 1.2 mg/dL Final  . Alkaline Phosphatase 10/06/2015 68  39 - 117 IU/L Final  . AST 10/06/2015 19  0 - 40 IU/L Final  . ALT 10/06/2015 12  0 - 32 IU/L Final    No results found.   Assessment/Plan   ICD-9-CM ICD-10-CM   1. Mixed Alzheimer's and vascular dementia 331.0 G30.9 BMP with eGFR   294.10 F01.50 ALT   290.40 F02.80   2. Cutaneous candidiasis - improved 112.3 B37.2   3. Essential hypertension, benign 401.1 I10 BMP with eGFR  4. Hyperlipemia 272.4 E78.5 Lipid Panel   Continue current medications as ordered  Will call with lab results  Follow up in 1 month for routine visit  Shirline Kendle S. Perlie Gold  [facility] [address] [place], [place] [address] [phone] Cell (Monday-Friday 8 AM - 5 PM) [phone] After 5 PM and follow prompts

## 2016-02-04 NOTE — Addendum Note (Signed)
Addended by: Sueanne MargaritaSMITH, Lowella Kindley L on: 02/04/2016 09:56 AM   Modules accepted: Orders

## 2016-03-05 ENCOUNTER — Ambulatory Visit (INDEPENDENT_AMBULATORY_CARE_PROVIDER_SITE_OTHER): Payer: Medicare Other | Admitting: Internal Medicine

## 2016-03-05 ENCOUNTER — Encounter: Payer: Self-pay | Admitting: Internal Medicine

## 2016-03-05 VITALS — BP 132/82 | HR 69 | Temp 98.2°F | Ht 67.0 in | Wt 138.0 lb

## 2016-03-05 DIAGNOSIS — F0393 Unspecified dementia, unspecified severity, with mood disturbance: Secondary | ICD-10-CM

## 2016-03-05 DIAGNOSIS — F329 Major depressive disorder, single episode, unspecified: Secondary | ICD-10-CM

## 2016-03-05 DIAGNOSIS — F29 Unspecified psychosis not due to a substance or known physiological condition: Secondary | ICD-10-CM | POA: Diagnosis not present

## 2016-03-05 DIAGNOSIS — I10 Essential (primary) hypertension: Secondary | ICD-10-CM

## 2016-03-05 DIAGNOSIS — F0150 Vascular dementia without behavioral disturbance: Secondary | ICD-10-CM | POA: Diagnosis not present

## 2016-03-05 DIAGNOSIS — E785 Hyperlipidemia, unspecified: Secondary | ICD-10-CM | POA: Diagnosis not present

## 2016-03-05 DIAGNOSIS — F0280 Dementia in other diseases classified elsewhere without behavioral disturbance: Secondary | ICD-10-CM | POA: Diagnosis not present

## 2016-03-05 DIAGNOSIS — F17200 Nicotine dependence, unspecified, uncomplicated: Secondary | ICD-10-CM

## 2016-03-05 DIAGNOSIS — M8580 Other specified disorders of bone density and structure, unspecified site: Secondary | ICD-10-CM | POA: Diagnosis not present

## 2016-03-05 DIAGNOSIS — B372 Candidiasis of skin and nail: Secondary | ICD-10-CM

## 2016-03-05 DIAGNOSIS — G309 Alzheimer's disease, unspecified: Secondary | ICD-10-CM | POA: Diagnosis not present

## 2016-03-05 LAB — ALT: ALT: 8 U/L (ref 6–29)

## 2016-03-05 LAB — BASIC METABOLIC PANEL WITH GFR
BUN: 13 mg/dL (ref 7–25)
CALCIUM: 9.5 mg/dL (ref 8.6–10.4)
CO2: 25 mmol/L (ref 20–31)
CREATININE: 0.74 mg/dL (ref 0.60–0.93)
Chloride: 105 mmol/L (ref 98–110)
GFR, Est African American: >89 mL/min (ref >=60)
GFR, Est Non African American: 78 mL/min (ref >=60)
Glucose, Bld: 81 mg/dL (ref 65–99)
Potassium: 4.2 mmol/L (ref 3.5–5.3)
SODIUM: 141 mmol/L (ref 135–146)

## 2016-03-05 LAB — LIPID PANEL
CHOLESTEROL: 147 mg/dL (ref 125–200)
HDL: 77 mg/dL (ref >=46)
LDL CALC: 59 mg/dL (ref <130)
Total CHOL/HDL Ratio: 1.9 Ratio (ref <=5.0)
Triglycerides: 57 mg/dL (ref <150)
VLDL: 11 mg/dL (ref <30)

## 2016-03-05 MED ORDER — ZOSTER VACCINE LIVE 19400 UNT/0.65ML ~~LOC~~ SUSR: 0.6500 mL | Freq: Once | SUBCUTANEOUS | 0 refills | Status: AC

## 2016-03-05 MED ORDER — ALENDRONATE SODIUM 70 MG PO TABS: 70.0000 mg | ORAL_TABLET | ORAL | 11 refills | Status: DC

## 2016-03-05 NOTE — Progress Notes (Signed)
Patient ID: Carol [name], female   DOB: 15-Oct-1935, 80 y.o.   MRN: [id number]    Location:  [facility] Place of Service: OFFICE  Chief Complaint  Patient presents with  . Medical Management of Chronic Issues    1 month follow up, here with daughter  . Medication Management    Fosamax wants to start back  . discuss colonoscopy    HPI:  80 yo female seen today for f/u. She reports no changes. Weight stable since last OV. Appetite good. No constipation. She continues to have scratching between buttocks and pubic area. Caretaker applying powder daily.  She is a poor historian due to dementia. Hx obtained form chart and 3 daughters. Her behavior has improved on higher dose of seroquel. Dementia unchanged. She continues to have paranoia and has significant issues with short term memory. Family has private duty care 2 times per week (Mon/Fri) where she gets bath and washes hair. Meals-on-wheels daily Mon- Fri. She is still driving as spouse does not stop her. She is hording paper towels, toilet paper, water and laundry detergent. She is taking namzaric and has exelon patch. Na 141. She is not driving  Cutaneous candidiasis - tx with po diflucan and flagyl. She uses nystatin powder  HTN - takes amlodipine. BP creeping up most likely related to chain smoking.  Hyperlipidemia -takes lipitor. LDL 75.  Osteopenia - not taking fosamax x 1 yr as she was out of Rx. Not on caltrate. Last DXA in 2015  Tobacco abuse - chain smoking with frail spouse now.  Past Medical History:  Diagnosis Date  . Anxiety state, unspecified   . Cataract   . Depressive disorder, not elsewhere classified   . Lumbago   . Memory loss   . Other and unspecified hyperlipidemia   . Rickets, late effect   . Tobacco use disorder   . Unspecified constipation   . Unspecified essential hypertension   . Varicose veins of lower extremities     Past Surgical History:  Procedure Laterality Date  . CYST EXCISION Bilateral      behind ears  . MOUTH SURGERY  04/12/2014    Patient Care Team: Gildardo Cranker, DO as PCP - General (Internal Medicine)  Social History   Social History  . Marital status: Married    Spouse name: N/A  . Number of children: N/A  . Years of education: N/A   Occupational History  . Not on file.   Social History Main Topics  . Smoking status: Current Every Day Smoker  . Smokeless tobacco: Never Used     Comment: No more than three cig daily   . Alcohol use No  . Drug use: No  . Sexual activity: Not on file   Other Topics Concern  . Not on file   Social History Narrative  . No narrative on file     reports that she has been smoking.  She has never used smokeless tobacco. She reports that she does not drink alcohol or use drugs.  Family History  Problem Relation Age of Onset  . Heart disease Father    Family Status  Relation Status  . Father Deceased at age 19   heart disease  . Mother Deceased at age 52   old age  . Daughter Alive  . Daughter Alive  . Daughter Alive  . Daughter Alive     Allergies  Allergen Reactions  . Penicillins     Rash & swelling  Medications: Patient's Medications  New Prescriptions   No medications on file  Previous Medications   AMLODIPINE (NORVASC) 5 MG TABLET    TAKE 1 TABLET BY MOUTH EVERY DAY   ATORVASTATIN (LIPITOR) 20 MG TABLET    Take one tablet by mouth once daily for cholesterol   CALCIUM CARBONATE-VITAMIN D (CALTRATE 600+D) 600-400 MG-UNIT PER TABLET    Take 1 tablet by mouth 2 (two) times a week.   DESLORATADINE (CLARINEX) 5 MG TABLET    Take 1 tablet (5 mg total) by mouth daily. Take 1 tablet daily as needed for allergies.   MULTIPLE VITAMINS-MINERALS (MULTIVITAMIN WITH MINERALS) TABLET    Take 1 tablet by mouth daily.   NAMZARIC 28-10 MG CP24    TAKE 1 CAPSULE BY MOUTH DAILY   NYSTATIN (MYCOSTATIN/NYSTOP) POWDER    Apply topically 2 (two) times daily. To groin and abdomen area   POLYETHYLENE GLYCOL POWDER  (GLYCOLAX/MIRALAX) POWDER    Take 17 g by mouth daily.   QUETIAPINE (SEROQUEL) 100 MG TABLET    Take 0.5 tab po qAM and 1 ablet by mouth at bedtime   RIVASTIGMINE (EXELON) 13.3 MG/24HR PT24    PLACE 1 PATCH ON SKIN EVERY DAY, ROTATE SITES  Modified Medications   No medications on file  Discontinued Medications   ALENDRONATE (FOSAMAX) 70 MG TABLET    Take 1 tablet (70 mg total) by mouth every 7 (seven) days. Take with a full glass of water on an empty stomach.    Review of Systems  Unable to perform ROS: Dementia    Vitals:   03/05/16 1422  BP: 132/82  Pulse: 69  Temp: 98.2 F (36.8 C)  TempSrc: Oral  SpO2: 95%  Weight: 138 lb (62.6 kg)  Height: _0  (1.702 m)   Body mass index is 21.61 kg/m.  Physical Exam  Constitutional: She appears well-developed.  Frail appearing in NAD  HENT:  Mouth/Throat: Oropharynx is clear and moist. No oropharyngeal exudate.  Eyes: Pupils are equal, round, and reactive to light. No scleral icterus.  Neck: Normal range of motion. Neck supple.  No carotid bruit b/l  Cardiovascular: Normal rate, regular rhythm and intact distal pulses.  Exam reveals no gallop and no friction rub.   Murmur (1/6 SEM) heard. No LE edema b/l. No calf TTP  Pulmonary/Chest: Effort normal and breath sounds normal. No respiratory distress. She has no wheezes. She has no rales. She exhibits no tenderness.  Musculoskeletal: She exhibits edema.  Lymphadenopathy:    She has no cervical adenopathy.  Neurological: She is alert.  Skin: Skin is warm and dry. Rash (R>L inguinal dark with redness, min maceration, moist. similar rash between buttocks. no secondary signs of infection) noted.  (+) skin tenting  Psychiatric: Her mood appears anxious. She is agitated. Thought content is delusional.     Labs reviewed: No visits with results within 3 Month(s) from this visit.  Latest known visit with results is:  Office Visit on 10/05/2015  Component Date Value Ref Range Status    . Glucose 10/06/2015 89  65 - 99 mg/dL Final  . BUN 10/06/2015 14  8 - 27 mg/dL Final  . Creatinine, Ser 10/06/2015 0.83  0.57 - 1.00 mg/dL Final  . GFR calc non Af Amer 10/06/2015 68  >59 mL/min/1.73 Final  . GFR calc Af Amer 10/06/2015 79  >59 mL/min/1.73 Final  . BUN/Creatinine Ratio 10/06/2015 17  12 - 28 Final  . Sodium 10/06/2015 141  134 - 144 mmol/L  Final  . Potassium 10/06/2015 4.3  3.5 - 5.2 mmol/L Final  . Chloride 10/06/2015 100  96 - 106 mmol/L Final  . CO2 10/06/2015 26  18 - 29 mmol/L Final  . Calcium 10/06/2015 9.2  8.7 - 10.3 mg/dL Final  . Total Protein 10/06/2015 7.1  6.0 - 8.5 g/dL Final  . Albumin 10/06/2015 3.9  3.5 - 4.8 g/dL Final  . Globulin, Total 10/06/2015 3.2  1.5 - 4.5 g/dL Final  . Albumin/Globulin Ratio 10/06/2015 1.2  1.2 - 2.2 Final  . Bilirubin Total 10/06/2015 0.2  0.0 - 1.2 mg/dL Final  . Alkaline Phosphatase 10/06/2015 68  39 - 117 IU/L Final  . AST 10/06/2015 19  0 - 40 IU/L Final  . ALT 10/06/2015 12  0 - 32 IU/L Final    No results found.   Assessment/Plan   ICD-9-CM ICD-10-CM   1. Osteopenia 733.90 M85.80 alendronate (FOSAMAX) 70 MG tablet  2. Mixed Alzheimer's and vascular dementia 331.0 G30.9 BMP with eGFR   294.10 F01.50 ALT   290.40 F02.80   3. Cutaneous candidiasis 112.3 B37.2   4. Essential hypertension, benign 401.1 I10 BMP with eGFR  5. Psychosis, unspecified psychosis type 298.9 F29 BMP with eGFR  6. Tobacco use disorder 305.1 F17.200   7. Depression due to dementia 311 F32.9 ALT   294.10 F02.80   8. Hyperlipemia 272.4 E78.5 Lipid Panel   Resume fosamax weekly. New Rx sent to pharmacy. Will repeat DXA in 2018 due to noncompliance with regimen this year  Continue current medications as ordered  Bring zostavax back to office and will administer at next visit  Will call with lab results. She was unable to get labs drawn last OV due to dehydration  Follow up in 1 month for routine visit  Geraldina Parrott S. Perlie Gold  [facility] [address] [place], [place] [address] [phone] Cell (Monday-Friday 8 AM - 5 PM) [phone] After 5 PM and follow prompts

## 2016-03-05 NOTE — Patient Instructions (Signed)
Continue current medications as ordered  Bring zostavax back to office and will administer at next visit  Will call with lab results  Follow up in 1 month for routine visit

## 2016-04-03 ENCOUNTER — Other Ambulatory Visit: Payer: Self-pay

## 2016-04-03 ENCOUNTER — Other Ambulatory Visit: Payer: Self-pay | Admitting: Internal Medicine

## 2016-04-03 DIAGNOSIS — F0150 Vascular dementia without behavioral disturbance: Secondary | ICD-10-CM

## 2016-04-03 DIAGNOSIS — F0280 Dementia in other diseases classified elsewhere without behavioral disturbance: Principal | ICD-10-CM

## 2016-04-03 DIAGNOSIS — G309 Alzheimer's disease, unspecified: Principal | ICD-10-CM

## 2016-04-03 MED ORDER — QUETIAPINE FUMARATE 100 MG PO TABS
ORAL_TABLET | ORAL | 6 refills | Status: DC
Start: 2016-04-03 — End: 2017-02-11

## 2016-04-23 ENCOUNTER — Ambulatory Visit (INDEPENDENT_AMBULATORY_CARE_PROVIDER_SITE_OTHER): Payer: Medicare Other | Admitting: Internal Medicine

## 2016-04-23 ENCOUNTER — Encounter: Payer: Self-pay | Admitting: Internal Medicine

## 2016-04-23 VITALS — BP 138/80 | HR 80 | Temp 98.5°F | Ht 67.0 in | Wt 145.8 lb

## 2016-04-23 DIAGNOSIS — I10 Essential (primary) hypertension: Secondary | ICD-10-CM

## 2016-04-23 DIAGNOSIS — B372 Candidiasis of skin and nail: Secondary | ICD-10-CM

## 2016-04-23 DIAGNOSIS — F0280 Dementia in other diseases classified elsewhere without behavioral disturbance: Secondary | ICD-10-CM | POA: Diagnosis not present

## 2016-04-23 DIAGNOSIS — G309 Alzheimer's disease, unspecified: Secondary | ICD-10-CM | POA: Diagnosis not present

## 2016-04-23 DIAGNOSIS — F29 Unspecified psychosis not due to a substance or known physiological condition: Secondary | ICD-10-CM | POA: Diagnosis not present

## 2016-04-23 DIAGNOSIS — F0150 Vascular dementia without behavioral disturbance: Secondary | ICD-10-CM | POA: Diagnosis not present

## 2016-04-23 MED ORDER — AMLODIPINE BESYLATE 5 MG PO TABS: 5.0000 mg | ORAL_TABLET | Freq: Every day | ORAL | 3 refills | Status: DC

## 2016-04-23 NOTE — Progress Notes (Signed)
Patient ID: Carol [name], female   DOB: March 13, 1936, 80 y.o.   MRN: [id number]    Location:  [facility] Place of Service: OFFICE  Chief Complaint  Patient presents with  . Medical Management of Chronic Issues    1 month routine visit    HPI:  80 yo female seen today for f/u. She has gained 7 lbs since last OV. She did not allow caretaker to bathe her last week but pt states she takes a bath everyday. appetite good. No constipation. She continues to have scratching between buttocks and pubic area. Caretaker applying powder daily.  She is a poor historian due to dementia. Hx obtained form chart and 2 daughters. Her behavior has improved on higher dose of seroquel. Dementia unchanged. She continues to have paranoia and has significant issues with short term memory. Family has private duty care 2 times per week (Mon/Fri) where she gets bath and washes hair. Meals-on-wheels daily Mon- Fri. She is still driving as spouse does not stop her. She is hording paper towels, toilet paper, water and laundry detergent. She is taking namzaric and has exelon patch. Na 141.   Cutaneous candidiasis - tx with po diflucan and flagyl. She uses nystatin powder. She still scratches groin area  HTN - takes amlodipine. Needs RF. BP stable. She is chain smoking.  Hyperlipidemia -takes lipitor. LDL 59.  Osteopenia - not taking fosamax x 1 yr as she was out of Rx. Not on caltrate. Last DXA in 2015  Tobacco abuse - chain smoking with frail spouse now  Past Medical History:  Diagnosis Date  . Anxiety state, unspecified   . Cataract   . Depressive disorder, not elsewhere classified   . Lumbago   . Memory loss   . Other and unspecified hyperlipidemia   . Rickets, late effect   . Tobacco use disorder   . Unspecified constipation   . Unspecified essential hypertension   . Varicose veins of lower extremities     Past Surgical History:  Procedure Laterality Date  . CYST EXCISION Bilateral    behind ears  .  MOUTH SURGERY  04/12/2014    Patient Care Team: Kirt BoysMonica Teara Duerksen, DO as PCP - General (Internal Medicine)  Social History   Social History  . Marital status: Married    Spouse name: N/A  . Number of children: N/A  . Years of education: N/A   Occupational History  . Not on file.   Social History Main Topics  . Smoking status: Current Every Day Smoker  . Smokeless tobacco: Never Used     Comment: No more than three cig daily   . Alcohol use No  . Drug use: No  . Sexual activity: Not on file   Other Topics Concern  . Not on file   Social History Narrative  . No narrative on file     reports that she has been smoking.  She has never used smokeless tobacco. She reports that she does not drink alcohol or use drugs.  Family History  Problem Relation Age of Onset  . Heart disease Father    Family Status  Relation Status  . Father Deceased at age 78   heart disease  . Mother Deceased at age 78   old age  . Daughter Alive  . Daughter Alive  . Daughter Alive  . Daughter Alive     Allergies  Allergen Reactions  . Penicillins     Rash & swelling    Medications: Patient's  Medications  New Prescriptions   No medications on file  Previous Medications   ALENDRONATE (FOSAMAX) 70 MG TABLET    Take 1 tablet (70 mg total) by mouth every 7 (seven) days. Take with a full glass of water on an empty stomach.   AMLODIPINE (NORVASC) 5 MG TABLET    TAKE 1 TABLET BY MOUTH EVERY DAY   ATORVASTATIN (LIPITOR) 20 MG TABLET    TAKE 1 TABLET BY MOUTH EVERY DAY FOR CHOLESTEROL   CALCIUM CARBONATE-VITAMIN D (CALTRATE 600+D) 600-400 MG-UNIT PER TABLET    Take 1 tablet by mouth 2 (two) times a week.   DESLORATADINE (CLARINEX) 5 MG TABLET    Take 1 tablet (5 mg total) by mouth daily. Take 1 tablet daily as needed for allergies.   MULTIPLE VITAMINS-MINERALS (MULTIVITAMIN WITH MINERALS) TABLET    Take 1 tablet by mouth daily.   NAMZARIC 28-10 MG CP24    TAKE 1 CAPSULE BY MOUTH DAILY   NYSTATIN  (MYCOSTATIN/NYSTOP) POWDER    Apply topically 2 (two) times daily. To groin and abdomen area   POLYETHYLENE GLYCOL POWDER (GLYCOLAX/MIRALAX) POWDER    Take 17 g by mouth daily.   QUETIAPINE (SEROQUEL) 100 MG TABLET    Take 0.5 tab po qAM and 1 ablet by mouth at bedtime   RIVASTIGMINE (EXELON) 13.3 MG/24HR PT24    PLACE 1 PATCH ON SKIN EVERY DAY, ROTATE SITES  Modified Medications   No medications on file  Discontinued Medications   No medications on file    Review of Systems  Unable to perform ROS: Dementia    Vitals:   04/23/16 1534  BP: 138/80  Pulse: 80  Temp: 98.5 F (36.9 C)  TempSrc: Oral  SpO2: 95%  Weight: 145 lb 12.8 oz (66.1 kg)  Height: 5\' 7"  (1.702 m)   Body mass index is 22.84 kg/m.  Physical Exam  Constitutional: She appears well-developed.  Frail appearing in NAD  Cardiovascular: Normal rate, regular rhythm and intact distal pulses.  Exam reveals no gallop and no friction rub.   Murmur (1/6 SEM) heard. Pulmonary/Chest: Effort normal and breath sounds normal. No respiratory distress. She has no wheezes. She has no rales.  Musculoskeletal: She exhibits edema.  Neurological: She is alert.  Skin: Skin is warm and dry. Rash (drying inguinl rash with postinflammatory hyperpigmentation) noted.  Psychiatric: She has a normal mood and affect. Her behavior is normal.     Labs reviewed: Office Visit on 03/05/2016  Component Date Value Ref Range Status  . Sodium 03/05/2016 141  135 - 146 mmol/L Final  . Potassium 03/05/2016 4.2  3.5 - 5.3 mmol/L Final  . Chloride 03/05/2016 105  98 - 110 mmol/L Final  . CO2 03/05/2016 25  20 - 31 mmol/L Final  . Glucose, Bld 03/05/2016 81  65 - 99 mg/dL Final  . BUN 16/03/9603 13  7 - 25 mg/dL Final  . Creat 54/02/8118 0.74  0.60 - 0.93 mg/dL Final   Comment:   For patients > or = 78 years of age: The upper reference limit for Creatinine is approximately 13% higher for people identified as African-American.     . Calcium  03/05/2016 9.5  8.6 - 10.4 mg/dL Final  . GFR, Est African American 03/05/2016 >89  >=60 mL/min Final  . GFR, Est Non African American 03/05/2016 78  >=60 mL/min Final  . ALT 03/05/2016 8  6 - 29 U/L Final  . Cholesterol 03/05/2016 147  125 - 200 mg/dL Final  .  Triglycerides 03/05/2016 57  <150 mg/dL Final  . HDL 16/10/960409/27/2017 77  >=46 mg/dL Final  . Total CHOL/HDL Ratio 03/05/2016 1.9  <=5.4<=5.0 Ratio Final  . VLDL 03/05/2016 11  <30 mg/dL Final  . LDL Cholesterol 03/05/2016 59  <130 mg/dL Final   Comment:   Total Cholesterol/HDL Ratio:CHD Risk                        Coronary Heart Disease Risk Table                                        Men       Women          1/2 Average Risk              3.4        3.3              Average Risk              5.0        4.4           2X Average Risk              9.6        7.1           3X Average Risk             23.4       11.0 Use the calculated Patient Ratio above and the CHD Risk table  to determine the patient's CHD Risk.     No results found.   Assessment/Plan   ICD-9-CM ICD-10-CM   1. Essential hypertension, benign - stable 401.1 I10   2. Mixed Alzheimer's and vascular dementia 331.0 G30.9    294.10 F01.50    290.40 F02.80   3. Psychosis, unspecified psychosis type 298.9 F29   4. Cutaneous candidiasis - improving 112.3 B37.2    Continue current medications as ordered  Follow up in 2 mos for routine visit.  Smoking cessation discussed and highly urged    Astrid Vides S. Ancil Linseyarter, D. O., F. A. C. O. I.  [facility] [address] [place], [place] [address] [phone] Cell (Monday-Friday 8 AM - 5 PM) [phone] After 5 PM and follow prompts

## 2016-04-23 NOTE — Patient Instructions (Signed)
Continue current medications as ordered  Follow up in 2 mos for routine visit.  Smoking cessation discussed and highly urged

## 2016-05-29 ENCOUNTER — Telehealth: Payer: Self-pay | Admitting: Internal Medicine

## 2016-05-29 NOTE — Telephone Encounter (Signed)
left msg asking pt to confirm this AWV appt w/ nurse. VDM (DD) °

## 2016-06-05 ENCOUNTER — Other Ambulatory Visit: Payer: Self-pay | Admitting: Internal Medicine

## 2016-06-05 DIAGNOSIS — F0150 Vascular dementia without behavioral disturbance: Secondary | ICD-10-CM

## 2016-06-05 DIAGNOSIS — F0280 Dementia in other diseases classified elsewhere without behavioral disturbance: Principal | ICD-10-CM

## 2016-06-05 DIAGNOSIS — G309 Alzheimer's disease, unspecified: Principal | ICD-10-CM

## 2016-06-27 ENCOUNTER — Ambulatory Visit: Payer: Medicare Other | Admitting: Internal Medicine

## 2016-06-27 ENCOUNTER — Ambulatory Visit: Payer: Medicare Other

## 2016-07-01 ENCOUNTER — Other Ambulatory Visit: Payer: Self-pay | Admitting: Internal Medicine

## 2016-07-01 DIAGNOSIS — F0280 Dementia in other diseases classified elsewhere without behavioral disturbance: Principal | ICD-10-CM

## 2016-07-01 DIAGNOSIS — G309 Alzheimer's disease, unspecified: Principal | ICD-10-CM

## 2016-07-01 DIAGNOSIS — F0150 Vascular dementia without behavioral disturbance: Secondary | ICD-10-CM

## 2016-07-04 ENCOUNTER — Encounter: Payer: Self-pay | Admitting: Internal Medicine

## 2016-07-04 ENCOUNTER — Ambulatory Visit (INDEPENDENT_AMBULATORY_CARE_PROVIDER_SITE_OTHER): Payer: Medicare Other | Admitting: Internal Medicine

## 2016-07-04 VITALS — BP 118/82 | HR 66 | Temp 98.3°F | Ht 67.0 in | Wt 143.4 lb

## 2016-07-04 DIAGNOSIS — I10 Essential (primary) hypertension: Secondary | ICD-10-CM

## 2016-07-04 DIAGNOSIS — F0393 Unspecified dementia, unspecified severity, with mood disturbance: Secondary | ICD-10-CM

## 2016-07-04 DIAGNOSIS — G309 Alzheimer's disease, unspecified: Secondary | ICD-10-CM

## 2016-07-04 DIAGNOSIS — F17200 Nicotine dependence, unspecified, uncomplicated: Secondary | ICD-10-CM

## 2016-07-04 DIAGNOSIS — B372 Candidiasis of skin and nail: Secondary | ICD-10-CM

## 2016-07-04 DIAGNOSIS — Z79899 Other long term (current) drug therapy: Secondary | ICD-10-CM | POA: Diagnosis not present

## 2016-07-04 DIAGNOSIS — E782 Mixed hyperlipidemia: Secondary | ICD-10-CM

## 2016-07-04 DIAGNOSIS — F0280 Dementia in other diseases classified elsewhere without behavioral disturbance: Secondary | ICD-10-CM

## 2016-07-04 DIAGNOSIS — F0150 Vascular dementia without behavioral disturbance: Secondary | ICD-10-CM

## 2016-07-04 DIAGNOSIS — F29 Unspecified psychosis not due to a substance or known physiological condition: Secondary | ICD-10-CM | POA: Diagnosis not present

## 2016-07-04 DIAGNOSIS — F329 Major depressive disorder, single episode, unspecified: Secondary | ICD-10-CM

## 2016-07-04 LAB — CBC WITH DIFFERENTIAL/PLATELET
Basophils Absolute: 0 cells/uL (ref 0–200)
Basophils Relative: 0 %
Eosinophils Absolute: 190 cells/uL (ref 15–500)
Eosinophils Relative: 2 %
HEMATOCRIT: 41.1 % (ref 35.0–45.0)
HEMOGLOBIN: 13.7 g/dL (ref 11.7–15.5)
LYMPHS ABS: 4085 {cells}/uL — AB (ref 850–3900)
LYMPHS PCT: 43 %
MCH: 30.4 pg (ref 27.0–33.0)
MCHC: 33.3 g/dL (ref 32.0–36.0)
MCV: 91.3 fL (ref 80.0–100.0)
MONO ABS: 475 {cells}/uL (ref 200–950)
MPV: 11.2 fL (ref 7.5–12.5)
Monocytes Relative: 5 %
NEUTROS PCT: 50 %
Neutro Abs: 4750 cells/uL (ref 1500–7800)
Platelets: 213 10*3/uL (ref 140–400)
RBC: 4.5 MIL/uL (ref 3.80–5.10)
RDW: 13.7 % (ref 11.0–15.0)
WBC: 9.5 10*3/uL (ref 3.8–10.8)

## 2016-07-04 LAB — TSH: TSH: 1.72 mIU/L

## 2016-07-04 MED ORDER — MEMANTINE HCL-DONEPEZIL HCL ER 28-10 MG PO CP24: 1.0000 | ORAL_CAPSULE | Freq: Every day | ORAL | 10 refills | Status: DC

## 2016-07-04 MED ORDER — RIVASTIGMINE 13.3 MG/24HR TD PT24: 1.0000 | MEDICATED_PATCH | Freq: Every day | TRANSDERMAL | 10 refills | Status: DC

## 2016-07-04 NOTE — Patient Instructions (Signed)
Continue current medications as ordered  Smoking cessation discussed and highly urged  Will call with lab results  Follow up in 4 mos for routine visit

## 2016-07-04 NOTE — Progress Notes (Signed)
Patient ID: Carol [name], female   DOB: 10-20-1935, 81 y.o.   MRN: [id number]    Location:  [facility] Place of Service: OFFICE  Chief Complaint  Patient presents with  . Medical Management of Chronic Issues    2 months routine visit    HPI:  81 yo female seen today for f/u. She continues to scratch groin frequently. No major changes in health/behavior. Takes MVI daily and Ca 2 daily.  She has lost 2 lbs since last OV. She did not allow caretaker to bathe her last week but pt states she takes a bath everyday. appetite good. No constipation. She continues to have scratching between buttocks and pubic area. Caretaker applying powder daily.  She is a poor historian due to dementia. Hx obtained form chart and 2 daughters. Her behavior has improved on higher dose of seroquel. Dementia unchanged. She continues to have paranoia and has significant issues with short term memory. Family has private duty care 2 times per week (Mon/Fri) where she gets bath and washes hair. Meals-on-wheels daily Mon- Fri. She is still driving as spouse does not stop her. She is hording paper towels, toilet paper, water and laundry detergent. She is taking namzaric and has exelon patch. Na 141.   Cutaneous candidiasis - tx with po diflucan and flagyl. She uses nystatin powder on a chronic basis. She still scratches groin area  HTN - takes amlodipine. Needs RF. BP stable. She is chain smoking.  Hyperlipidemia -takes lipitor. LDL 59.  Osteopenia - not taking fosamax x 1 yr as she was out of Rx. Not on caltrate. Last DXA in 2015  Tobacco abuse - chain smoking with frail spouse now  Past Medical History:  Diagnosis Date  . Anxiety state, unspecified   . Cataract   . Depressive disorder, not elsewhere classified   . Lumbago   . Memory loss   . Other and unspecified hyperlipidemia   . Rickets, late effect   . Tobacco use disorder   . Unspecified constipation   . Unspecified essential hypertension   . Varicose  veins of lower extremities     Past Surgical History:  Procedure Laterality Date  . CYST EXCISION Bilateral    behind ears  . MOUTH SURGERY  04/12/2014    Patient Care Team: Kirt Boys, DO as PCP - General (Internal Medicine)  Social History   Social History  . Marital status: Married    Spouse name: N/A  . Number of children: N/A  . Years of education: N/A   Occupational History  . Not on file.   Social History Main Topics  . Smoking status: Current Every Day Smoker  . Smokeless tobacco: Never Used     Comment: No more than three cig daily   . Alcohol use No  . Drug use: No  . Sexual activity: Not on file   Other Topics Concern  . Not on file   Social History Narrative  . No narrative on file     reports that she has been smoking.  She has never used smokeless tobacco. She reports that she does not drink alcohol or use drugs.  Family History  Problem Relation Age of Onset  . Heart disease Father    Family Status  Relation Status  . Father Deceased at age 69   heart disease  . Mother Deceased at age 19   old age  . Daughter Alive  . Daughter Alive  . Daughter Alive  . Daughter Alive  Allergies  Allergen Reactions  . Penicillins     Rash & swelling    Medications: Patient's Medications  New Prescriptions   No medications on file  Previous Medications   ALENDRONATE (FOSAMAX) 70 MG TABLET    Take 1 tablet (70 mg total) by mouth every 7 (seven) days. Take with a full glass of water on an empty stomach.   AMLODIPINE (NORVASC) 5 MG TABLET    Take 1 tablet (5 mg total) by mouth daily.   ATORVASTATIN (LIPITOR) 20 MG TABLET    TAKE 1 TABLET BY MOUTH EVERY DAY FOR CHOLESTEROL   CALCIUM CARBONATE-VITAMIN D (CALTRATE 600+D) 600-400 MG-UNIT PER TABLET    Take 1 tablet by mouth 2 (two) times a week.   MULTIPLE VITAMINS-MINERALS (MULTIVITAMIN WITH MINERALS) TABLET    Take 1 tablet by mouth daily.   NAMZARIC 28-10 MG CP24    TAKE 1 CAPSULE BY MOUTH DAILY    NYSTATIN (MYCOSTATIN/NYSTOP) POWDER    Apply topically 2 (two) times daily. To groin and abdomen area   POLYETHYLENE GLYCOL POWDER (GLYCOLAX/MIRALAX) POWDER    Take 17 g by mouth daily.   QUETIAPINE (SEROQUEL) 100 MG TABLET    Take 0.5 tab po qAM and 1 ablet by mouth at bedtime   RIVASTIGMINE 13.3 MG/24HR PT24    PLACE 1 PATCH ON SKIN DAILY, ROTATE SIDES  Modified Medications   No medications on file  Discontinued Medications   DESLORATADINE (CLARINEX) 5 MG TABLET    Take 1 tablet (5 mg total) by mouth daily. Take 1 tablet daily as needed for allergies.    Review of Systems  Unable to perform ROS: Dementia    Vitals:   07/04/16 1419  BP: 118/82  Pulse: 66  Temp: 98.3 F (36.8 C)  TempSrc: Oral  SpO2: 98%  Weight: 143 lb 6.4 oz (65 kg)  Height: 5' 7"  (1.702 m)   Body mass index is 22.46 kg/m.  Physical Exam  Constitutional: She appears well-developed.  Frail appearing in NAD  HENT:  Mouth/Throat: Oropharynx is clear and moist. No oropharyngeal exudate.  Eyes: Pupils are equal, round, and reactive to light. No scleral icterus.  Neck: Normal range of motion. Neck supple.  No carotid bruit b/l  Cardiovascular: Normal rate, regular rhythm and intact distal pulses.  Exam reveals no gallop and no friction rub.   Murmur (1/6 SEM) heard. No LE edema b/l. No calf TTP  Pulmonary/Chest: Effort normal and breath sounds normal. No respiratory distress. She has no wheezes. She has no rales. She exhibits no tenderness.  Abdominal: Soft. Bowel sounds are normal. She exhibits no distension and no mass. There is no tenderness. There is no rebound and no guarding.  Musculoskeletal: She exhibits edema.  Lymphadenopathy:    She has no cervical adenopathy.  Neurological: She is alert.  Skin: Skin is warm and dry. Rash (R>L inguinal dark with less redness and no maceration, dry; extends to between  buttocks snd also abdominal pannus) noted.  Psychiatric: Her mood appears anxious. She is not  agitated. Thought content is delusional.     Labs reviewed: No visits with results within 3 Month(s) from this visit.  Latest known visit with results is:  Office Visit on 03/05/2016  Component Date Value Ref Range Status  . Sodium 03/05/2016 141  135 - 146 mmol/L Final  . Potassium 03/05/2016 4.2  3.5 - 5.3 mmol/L Final  . Chloride 03/05/2016 105  98 - 110 mmol/L Final  . CO2 03/05/2016 25  20 - 31 mmol/L Final  . Glucose, Bld 03/05/2016 81  65 - 99 mg/dL Final  . BUN 03/05/2016 13  7 - 25 mg/dL Final  . Creat 03/05/2016 0.74  0.60 - 0.93 mg/dL Final   Comment:   For patients > or = 79 years of age: The upper reference limit for Creatinine is approximately 13% higher for people identified as African-American.     . Calcium 03/05/2016 9.5  8.6 - 10.4 mg/dL Final  . GFR, Est African American 03/05/2016 >89  >=60 mL/min Final  . GFR, Est Non African American 03/05/2016 78  >=60 mL/min Final  . ALT 03/05/2016 8  6 - 29 U/L Final  . Cholesterol 03/05/2016 147  125 - 200 mg/dL Final  . Triglycerides 03/05/2016 57  <150 mg/dL Final  . HDL 03/05/2016 77  >=46 mg/dL Final  . Total CHOL/HDL Ratio 03/05/2016 1.9  <=5.0 Ratio Final  . VLDL 03/05/2016 11  <30 mg/dL Final  . LDL Cholesterol 03/05/2016 59  <130 mg/dL Final   Comment:   Total Cholesterol/HDL Ratio:CHD Risk                        Coronary Heart Disease Risk Table                                        Men       Women          1/2 Average Risk              3.4        3.3              Average Risk              5.0        4.4           2X Average Risk              9.6        7.1           3X Average Risk             23.4       11.0 Use the calculated Patient Ratio above and the CHD Risk table  to determine the patient's CHD Risk.     No results found.   Assessment/Plan   ICD-9-CM ICD-10-CM   1. Mixed Alzheimer's and vascular dementia 331.0 G30.9 Memantine HCl-Donepezil HCl (NAMZARIC) 28-10 MG CP24   294.10 F01.50  Rivastigmine 13.3 MG/24HR PT24   290.40 F02.80 CMP with eGFR  2. High risk medication use V58.69 Z79.899 CMP with eGFR     CBC with Differential/Platelets  3. Cutaneous candidiasis - failing to change as expected 112.3 B37.2   4. Psychosis, unspecified psychosis type 298.9 F29   5. Essential hypertension, benign 401.1 I10 CMP with eGFR  6. Depression due to dementia 311 F32.9    294.10 F02.80   7. Tobacco use disorder 305.1 F17.200   8. Mixed hyperlipidemia 272.2 E78.2 CMP with eGFR     Lipid Panel     TSH   Continue current medications as ordered. Use nystatin powder between buttocks, perineum, b/l inguinal and abdominal pannus. T/c oral antifungal if no better  Smoking cessation discussed and highly urged  Will call with lab results  Follow up in 4 mos  for routine visit  Halla Chopp S. Perlie Gold  [facility] [address] [place], [place] [address] [phone] Cell (Monday-Friday 8 AM - 5 PM) [phone] After 5 PM and follow prompts

## 2016-07-05 LAB — LIPID PANEL
CHOL/HDL RATIO: 1.9 ratio (ref <5.0)
Cholesterol: 144 mg/dL (ref <200)
HDL: 74 mg/dL (ref >50)
LDL Cholesterol: 56 mg/dL (ref <100)
TRIGLYCERIDES: 70 mg/dL (ref <150)
VLDL: 14 mg/dL (ref <30)

## 2016-07-05 LAB — COMPLETE METABOLIC PANEL WITH GFR
ALBUMIN: 3.8 g/dL (ref 3.6–5.1)
ALK PHOS: 68 U/L (ref 33–130)
ALT: 5 U/L — ABNORMAL LOW (ref 6–29)
AST: 13 U/L (ref 10–35)
BILIRUBIN TOTAL: 0.6 mg/dL (ref 0.2–1.2)
BUN: 10 mg/dL (ref 7–25)
CO2: 21 mmol/L (ref 20–31)
Calcium: 9.5 mg/dL (ref 8.6–10.4)
Chloride: 107 mmol/L (ref 98–110)
Creat: 0.92 mg/dL (ref 0.60–0.93)
GFR, EST AFRICAN AMERICAN: 69 mL/min (ref >=60)
GFR, EST NON AFRICAN AMERICAN: 60 mL/min (ref >=60)
GLUCOSE: 96 mg/dL (ref 65–99)
POTASSIUM: 3.7 mmol/L (ref 3.5–5.3)
SODIUM: 140 mmol/L (ref 135–146)
Total Protein: 7.4 g/dL (ref 6.1–8.1)

## 2016-10-06 ENCOUNTER — Other Ambulatory Visit: Payer: Self-pay | Admitting: Internal Medicine

## 2016-10-27 ENCOUNTER — Telehealth: Payer: Self-pay | Admitting: Internal Medicine

## 2016-10-27 NOTE — Telephone Encounter (Signed)
left msg asking pt to confirm this AWV-I w/ nurse before seeing Dr. Montez Moritaarter. VDM (DD)

## 2016-10-29 ENCOUNTER — Ambulatory Visit (INDEPENDENT_AMBULATORY_CARE_PROVIDER_SITE_OTHER): Payer: Medicare Other

## 2016-10-29 ENCOUNTER — Encounter: Payer: Self-pay | Admitting: Internal Medicine

## 2016-10-29 ENCOUNTER — Ambulatory Visit (INDEPENDENT_AMBULATORY_CARE_PROVIDER_SITE_OTHER): Payer: Medicare Other | Admitting: Internal Medicine

## 2016-10-29 VITALS — BP 152/84 | HR 88 | Temp 98.3°F | Ht 67.0 in | Wt 138.0 lb

## 2016-10-29 DIAGNOSIS — B372 Candidiasis of skin and nail: Secondary | ICD-10-CM

## 2016-10-29 DIAGNOSIS — F29 Unspecified psychosis not due to a substance or known physiological condition: Secondary | ICD-10-CM | POA: Diagnosis not present

## 2016-10-29 DIAGNOSIS — F0280 Dementia in other diseases classified elsewhere without behavioral disturbance: Secondary | ICD-10-CM | POA: Diagnosis not present

## 2016-10-29 DIAGNOSIS — G309 Alzheimer's disease, unspecified: Secondary | ICD-10-CM

## 2016-10-29 DIAGNOSIS — Z723 Lack of physical exercise: Secondary | ICD-10-CM

## 2016-10-29 DIAGNOSIS — R634 Abnormal weight loss: Secondary | ICD-10-CM

## 2016-10-29 DIAGNOSIS — E782 Mixed hyperlipidemia: Secondary | ICD-10-CM | POA: Diagnosis not present

## 2016-10-29 DIAGNOSIS — F0150 Vascular dementia without behavioral disturbance: Secondary | ICD-10-CM | POA: Diagnosis not present

## 2016-10-29 DIAGNOSIS — Z79899 Other long term (current) drug therapy: Secondary | ICD-10-CM | POA: Diagnosis not present

## 2016-10-29 DIAGNOSIS — I10 Essential (primary) hypertension: Secondary | ICD-10-CM

## 2016-10-29 DIAGNOSIS — F17200 Nicotine dependence, unspecified, uncomplicated: Secondary | ICD-10-CM | POA: Diagnosis not present

## 2016-10-29 DIAGNOSIS — Z0000 Encounter for general adult medical examination without abnormal findings: Secondary | ICD-10-CM

## 2016-10-29 MED ORDER — NYSTATIN 100000 UNIT/GM EX POWD: Freq: Two times a day (BID) | CUTANEOUS | 1 refills | Status: DC

## 2016-10-29 MED ORDER — ATORVASTATIN CALCIUM 20 MG PO TABS: ORAL_TABLET | ORAL | 6 refills | Status: DC

## 2016-10-29 NOTE — Patient Instructions (Signed)
[name] , Thank you for taking time to come for your Medicare Wellness Visit. I appreciate your ongoing commitment to your health goals. Please review the following plan we discussed and let me know if I can assist you in the future.   Screening recommendations/referrals: Colonoscopy up to date Mammogram up to date Bone Density up to date Recommended yearly ophthalmology/optometry visit for glaucoma screening and checkup Recommended yearly dental visit for hygiene and checkup  Vaccinations: Influenza vaccine up to date. Due 01/31/2017 Pneumococcal vaccine up to date Tdap vaccine up to date. Due 06/08/2024 Shingles vaccine due. If you want the new vaccine let us know and we will send prescription to pharmacy  Advanced directives: Need a copy for the chart  Conditions/risks identified: BP high  Next appointment: Montez Moritaarter 10/29/2016 @ 3:30pm   Preventive Care 65 Years and Older, Female Preventive care refers to lifestyle choices and visits with your health care provider that can promote health and wellness. What does preventive care include?  A yearly physical exam. This is also called an annual well check.  Dental exams once or twice a year.  Routine eye exams. Ask your health care provider how often you should have your eyes checked.  Personal lifestyle choices, including:  Daily care of your teeth and gums.  Regular physical activity.  Eating a healthy diet.  Avoiding tobacco and drug use.  Limiting alcohol use.  Practicing safe sex.  Taking low-dose aspirin every day.  Taking vitamin and mineral supplements as recommended by your health care provider. What happens during an annual well check? The services and screenings done by your health care provider during your annual well check will depend on your age, overall health, lifestyle risk factors, and family history of disease. Counseling  Your health care provider may ask you questions about your:  Alcohol  use.  Tobacco use.  Drug use.  Emotional well-being.  Home and relationship well-being.  Sexual activity.  Eating habits.  History of falls.  Memory and ability to understand (cognition).  Work and work Astronomerenvironment.  Reproductive health. Screening  You may have the following tests or measurements:  Height, weight, and BMI.  Blood pressure.  Lipid and cholesterol levels. These may be checked every 5 years, or more frequently if you are over 493 years old.  Skin check.  Lung cancer screening. You may have this screening every year starting at age 79 if you have a 30-pack-year history of smoking and currently smoke or have quit within the past 15 years.  Fecal occult blood test (FOBT) of the stool. You may have this test every year starting at age 79.  Flexible sigmoidoscopy or colonoscopy. You may have a sigmoidoscopy every 5 years or a colonoscopy every 10 years starting at age 79.  Hepatitis C blood test.  Hepatitis B blood test.  Sexually transmitted disease (STD) testing.  Diabetes screening. This is done by checking your blood sugar (glucose) after you have not eaten for a while (fasting). You may have this done every 1-3 years.  Bone density scan. This is done to screen for osteoporosis. You may have this done starting at age 79.  Mammogram. This may be done every 1-2 years. Talk to your health care provider about how often you should have regular mammograms. Talk with your health care provider about your test results, treatment options, and if necessary, the need for more tests. Vaccines  Your health care provider may recommend certain vaccines, such as:  Influenza vaccine. This  is recommended every year.  Tetanus, diphtheria, and acellular pertussis (Tdap, Td) vaccine. You may need a Td booster every 10 years.  Zoster vaccine. You may need this after age 32.  Pneumococcal 13-valent conjugate (PCV13) vaccine. One dose is recommended after age  58.  Pneumococcal polysaccharide (PPSV23) vaccine. One dose is recommended after age 15. Talk to your health care provider about which screenings and vaccines you need and how often you need them. This information is not intended to replace advice given to you by your health care provider. Make sure you discuss any questions you have with your health care provider. Document Released: 06/22/2015 Document Revised: 02/13/2016 Document Reviewed: 03/27/2015 Elsevier Interactive Patient Education  2017 [place] Prevention in the Home Falls can cause injuries. They can happen to people of all ages. There are many things you can do to make your home safe and to help prevent falls. What can I do on the outside of my home?  Regularly fix the edges of walkways and driveways and fix any cracks.  Remove anything that might make you trip as you walk through a door, such as a raised step or threshold.  Trim any bushes or trees on the path to your home.  Use bright outdoor lighting.  Clear any walking paths of anything that might make someone trip, such as rocks or tools.  Regularly check to see if handrails are loose or broken. Make sure that both sides of any steps have handrails.  Any raised decks and porches should have guardrails on the edges.  Have any leaves, snow, or ice cleared regularly.  Use sand or salt on walking paths during winter.  Clean up any spills in your garage right away. This includes oil or grease spills. What can I do in the bathroom?  Use night lights.  Install grab bars by the toilet and in the tub and shower. Do not use towel bars as grab bars.  Use non-skid mats or decals in the tub or shower.  If you need to sit down in the shower, use a plastic, non-slip stool.  Keep the floor dry. Clean up any water that spills on the floor as soon as it happens.  Remove soap buildup in the tub or shower regularly.  Attach bath mats securely with double-sided  non-slip rug tape.  Do not have throw rugs and other things on the floor that can make you trip. What can I do in the bedroom?  Use night lights.  Make sure that you have a light by your bed that is easy to reach.  Do not use any sheets or blankets that are too big for your bed. They should not hang down onto the floor.  Have a firm chair that has side arms. You can use this for support while you get dressed.  Do not have throw rugs and other things on the floor that can make you trip. What can I do in the kitchen?  Clean up any spills right away.  Avoid walking on wet floors.  Keep items that you use a lot in easy-to-reach places.  If you need to reach something above you, use a strong step stool that has a grab bar.  Keep electrical cords out of the way.  Do not use floor polish or wax that makes floors slippery. If you must use wax, use non-skid floor wax.  Do not have throw rugs and other things on the floor that can make you  trip. What can I do with my stairs?  Do not leave any items on the stairs.  Make sure that there are handrails on both sides of the stairs and use them. Fix handrails that are broken or loose. Make sure that handrails are as long as the stairways.  Check any carpeting to make sure that it is firmly attached to the stairs. Fix any carpet that is loose or worn.  Avoid having throw rugs at the top or bottom of the stairs. If you do have throw rugs, attach them to the floor with carpet tape.  Make sure that you have a light switch at the top of the stairs and the bottom of the stairs. If you do not have them, ask someone to add them for you. What else can I do to help prevent falls?  Wear shoes that:  Do not have high heels.  Have rubber bottoms.  Are comfortable and fit you well.  Are closed at the toe. Do not wear sandals.  If you use a stepladder:  Make sure that it is fully opened. Do not climb a closed stepladder.  Make sure that both  sides of the stepladder are locked into place.  Ask someone to hold it for you, if possible.  Clearly mark and make sure that you can see:  Any grab bars or handrails.  First and last steps.  Where the edge of each step is.  Use tools that help you move around (mobility aids) if they are needed. These include:  Canes.  Walkers.  Scooters.  Crutches.  Turn on the lights when you go into a dark area. Replace any light bulbs as soon as they burn out.  Set up your furniture so you have a clear path. Avoid moving your furniture around.  If any of your floors are uneven, fix them.  If there are any pets around you, be aware of where they are.  Review your medicines with your doctor. Some medicines can make you feel dizzy. This can increase your chance of falling. Ask your doctor what other things that you can do to help prevent falls. This information is not intended to replace advice given to you by your health care provider. Make sure you discuss any questions you have with your health care provider. Document Released: 03/22/2009 Document Revised: 11/01/2015 Document Reviewed: 06/30/2014 Elsevier Interactive Patient Education  2017 [employer].

## 2016-10-29 NOTE — Progress Notes (Signed)
Subjective:   Carol [name] is a 81 y.o. female who presents for an Initial Medicare Annual Wellness Visit.       Objective:    Today's Vitals   10/29/16 1451  BP: (!) 152/84  Pulse: 88  Temp: 98.3 F (36.8 C)  TempSrc: Oral  SpO2: 97%  Weight: 138 lb (62.6 kg)  Height: 5\' 7"  (1.702 m)   Body mass index is 21.61 kg/m.   Current Medications (verified) Outpatient Encounter Prescriptions as of 10/29/2016  Medication Sig  . alendronate (FOSAMAX) 70 MG tablet Take 1 tablet (70 mg total) by mouth every 7 (seven) days. Take with a full glass of water on an empty stomach.  Marland Kitchen. amLODipine (NORVASC) 5 MG tablet Take 1 tablet (5 mg total) by mouth daily.  Marland Kitchen. atorvastatin (LIPITOR) 20 MG tablet TAKE 1 TABLET BY MOUTH EVERY DAY FOR CHOLESTEROL  . Memantine HCl-Donepezil HCl (NAMZARIC) 28-10 MG CP24 Take 1 capsule by mouth daily.  . Multiple Vitamins-Minerals (MULTIVITAMIN WITH MINERALS) tablet Take 1 tablet by mouth daily.  Marland Kitchen. nystatin (MYCOSTATIN/NYSTOP) powder Apply topically 2 (two) times daily. To groin and abdomen area  . QUEtiapine (SEROQUEL) 100 MG tablet Take 0.5 tab po qAM and 1 ablet by mouth at bedtime  . Rivastigmine 13.3 MG/24HR PT24 Place 1 patch (13.3 mg total) onto the skin daily.  . Calcium Carbonate-Vitamin D (CALTRATE 600+D) 600-400 MG-UNIT per tablet Take 1 tablet by mouth 2 (two) times a week.  . polyethylene glycol powder (GLYCOLAX/MIRALAX) powder Take 17 g by mouth daily. (Patient not taking: Reported on 10/29/2016)   No facility-administered encounter medications on file as of 10/29/2016.     Allergies (verified) Penicillins   History: Past Medical History:  Diagnosis Date  . Anxiety state, unspecified   . Cataract   . Depressive disorder, not elsewhere classified   . Lumbago   . Memory loss   . Other and unspecified hyperlipidemia   . Rickets, late effect   . Tobacco use disorder   . Unspecified constipation   . Unspecified essential hypertension    . Varicose veins of lower extremities    Past Surgical History:  Procedure Laterality Date  . CYST EXCISION Bilateral    behind ears  . MOUTH SURGERY  04/12/2014   Family History  Problem Relation Age of Onset  . Heart disease Father    Social History   Occupational History  . Not on file.   Social History Main Topics  . Smoking status: Current Every Day Smoker    Years: 55.00  . Smokeless tobacco: Never Used     Comment: 3 cigareetes a day  . Alcohol use No  . Drug use: No  . Sexual activity: Not on file    Tobacco Counseling Ready to quit: Not Answered Counseling given: Not Answered   Activities of Daily Living In your present state of health, do you have any difficulty performing the following activities: 10/29/2016  Hearing? N  Vision? N  Difficulty concentrating or making decisions? Y  Walking or climbing stairs? N  Dressing or bathing? N  Doing errands, shopping? Y  Preparing Food and eating ? Y  Using the Toilet? N  In the past six months, have you accidently leaked urine? N  Do you have problems with loss of bowel control? N  Managing your Medications? Y  Managing your Finances? Y  Housekeeping or managing your Housekeeping? Y  Some recent data might be hidden    Immunizations and Health  Maintenance Immunization History  Administered Date(s) Administered  . Influenza Whole 04/06/2012  . Influenza,inj,Quad PF,36+ Mos 03/01/2013, 06/08/2014, 02/16/2015, 02/01/2016  . Pneumococcal Conjugate-13 12/15/2014  . Pneumococcal Polysaccharide-23 07/10/2011  . Td 07/10/2011  . Tdap 06/08/2014   There are no preventive care reminders to display for this patient.  Patient Care Team: Kirt Boys, DO as PCP - General (Internal Medicine)  Indicate any recent Medical Services you may have received from other than [facility] providers in the past year (date may be approximate).     Assessment:   This is a routine wellness examination for [place].    Hearing/Vision screen No exam data present  Dietary issues and exercise activities discussed: Current Exercise Habits: The patient does not participate in regular exercise at present, Exercise limited by: None identified  Goals    . Community involvement          Pt would like to get involved in the community       Depression Screen PHQ 2/9 Scores 10/29/2016 12/14/2015 11/14/2015 12/14/2013 03/01/2013  PHQ - 2 Score 0 0 0 0 0    Fall Risk Fall Risk  10/29/2016 07/04/2016 04/23/2016 03/05/2016 02/01/2016  Falls in the past year? No No No No No    Cognitive Function: MMSE - Mini Mental State Exam 10/29/2016 12/15/2014 12/14/2013 03/01/2013  Orientation to time 0 1 3 3   Orientation to Place 3 4 5 5   Registration 3 3 3 3   Attention/ Calculation 5 5 5 5   Recall 0 2 0 0  Language- name 2 objects 2 2 2 2   Language- repeat 1 1 1 1   Language- follow 3 step command 3 3 3 3   Language- read & follow direction 1 1 1 1   Write a sentence 1 1 1 1   Copy design 0 1 1 1   Total score 19 24 25 25         Screening Tests Health Maintenance  Topic Date Due  . INFLUENZA VACCINE  01/07/2017  . TETANUS/TDAP  06/08/2024  . DEXA SCAN  Completed  . PNA vac Low Risk Adult  Completed      Plan:  I have personally reviewed and addressed the Medicare Annual Wellness questionnaire and have noted the following in the patient's chart:  A. Medical and social history B. Use of alcohol, tobacco or illicit drugs  C. Current medications and supplements D. Functional ability and status E.  Nutritional status F.  Physical activity G. Advance directives H. List of other physicians I.  Hospitalizations, surgeries, and ER visits in previous 12 months J.  Vitals K. Screenings to include hearing, vision, cognitive, depression L. Referrals and appointments - none  In addition, I have reviewed and discussed with patient certain preventive protocols, quality metrics, and best practice recommendations. A written  personalized care plan for preventive services as well as general preventive health recommendations were provided to patient.  See attached scanned questionnaire for additional information.   Signed,   Annetta Maw, RN Nurse Health Advisor   I have reviewed the health advisor's note and was available for consultation. I agree with documentation and plan.   Zehava Turski S. Ancil Linsey  [facility] [address] [place], [place] [address] [phone] Cell (Monday-Friday 8 AM - 5 PM) [phone] After 5 PM and follow prompts

## 2016-10-29 NOTE — Progress Notes (Signed)
Quick Notes   Health Maintenance: None     Abnormal Screen: MMSE 19/30. Passed clock drawing. 1st BP: 152/84 2nd BP:162/92     Patient Concerns: None     Nurse Concerns: None

## 2016-10-29 NOTE — Progress Notes (Signed)
Patient ID: Carol [name], female   DOB: [birth year], 81 y.o.  MRN: [id number]     Location:  [facility] Place of Service: OFFICE  Chief Complaint  Patient presents with  . Medical Management of Chronic Issues    4 months Routine Visit  . MMSE    19/30 Passed Clock drawing    HPI:  81 yo female seen today for f/u. She has intermittent episodes of getting physical with oldest daughter. She has lost 5 lbs since last OV.  No major changes in health/behavior. Takes MVI daily and Ca 2 daily.  She did allow caretaker to bathe her last night. Appetite good. No constipation. She continues to have occasional scratching between buttocks and pubic area. Caretaker applying powder daily.  She is a poor historian due to dementia. Hx obtained form chart and 2 daughters. Her behavior has improved on higher dose of seroquel. Dementia unchanged. She continues to have paranoia and has significant issues with short term memory. Family has private duty care 2 times per week (Mon/Fri) where she gets bath and washes hair. No recent Meals-on-wheels deliveries. She is still driving as spouse does not stop her. She is hording paper towels, toilet paper, water and laundry detergent. She is taking namzaric and has exelon patch. Na 140.   Cutaneous candidiasis - tx with po diflucan and flagyl in the past. She uses nystatin powder on a chronic basis. She still scratches groin area  HTN - takes amlodipine. BP stable. She is chain smoking.  Hyperlipidemia -takes lipitor. LDL 59.  Osteopenia - not taking fosamax x 1 yr as she was out of Rx. Not on caltrate. Last DXA in 2015  Tobacco abuse - chain smoking with frail spouse now  Past Medical History:  Diagnosis Date  . Anxiety state, unspecified   . Cataract   . Depressive disorder, not elsewhere classified   . Lumbago   . Memory loss   . Other and unspecified hyperlipidemia   . Rickets, late effect   . Tobacco use disorder   . Unspecified constipation   .  Unspecified essential hypertension   . Varicose veins of lower extremities     Past Surgical History:  Procedure Laterality Date  . CYST EXCISION Bilateral    behind ears  . MOUTH SURGERY  04/12/2014    Patient Care Team: Gildardo Cranker, DO as PCP - General (Internal Medicine)  Social History   Social History  . Marital status: Married    Spouse name: N/A  . Number of children: N/A  . Years of education: N/A   Occupational History  . Not on file.   Social History Main Topics  . Smoking status: Current Every Day Smoker    Years: 55.00  . Smokeless tobacco: Never Used     Comment: 3 cigareetes a day  . Alcohol use No  . Drug use: No  . Sexual activity: Not on file   Other Topics Concern  . Not on file   Social History Narrative  . No narrative on file     reports that she has been smoking.  She has smoked for the past 55.00 years. She has never used smokeless tobacco. She reports that she does not drink alcohol or use drugs.  Family History  Problem Relation Age of Onset  . Heart disease Father    Family Status  Relation Status  . Father Deceased at age 87       heart disease  . Mother Deceased at  age 71       old age  . Daughter Alive  . Daughter Alive  . Daughter Alive  . Daughter Alive     Allergies  Allergen Reactions  . Penicillins     Rash & swelling    Medications: Patient's Medications  New Prescriptions   No medications on file  Previous Medications   ALENDRONATE (FOSAMAX) 70 MG TABLET    Take 1 tablet (70 mg total) by mouth every 7 (seven) days. Take with a full glass of water on an empty stomach.   AMLODIPINE (NORVASC) 5 MG TABLET    Take 1 tablet (5 mg total) by mouth daily.   ATORVASTATIN (LIPITOR) 20 MG TABLET    TAKE 1 TABLET BY MOUTH EVERY DAY FOR CHOLESTEROL   CALCIUM CARBONATE-VITAMIN D (CALTRATE 600+D) 600-400 MG-UNIT PER TABLET    Take 1 tablet by mouth 2 (two) times a week.   MEMANTINE HCL-DONEPEZIL HCL (NAMZARIC) 28-10 MG  CP24    Take 1 capsule by mouth daily.   MULTIPLE VITAMINS-MINERALS (MULTIVITAMIN WITH MINERALS) TABLET    Take 1 tablet by mouth daily.   NYSTATIN (MYCOSTATIN/NYSTOP) POWDER    Apply topically 2 (two) times daily. To groin and abdomen area   POLYETHYLENE GLYCOL POWDER (GLYCOLAX/MIRALAX) POWDER    Take 17 g by mouth daily.   QUETIAPINE (SEROQUEL) 100 MG TABLET    Take 0.5 tab po qAM and 1 ablet by mouth at bedtime   RIVASTIGMINE 13.3 MG/24HR PT24    Place 1 patch (13.3 mg total) onto the skin daily.  Modified Medications   No medications on file  Discontinued Medications   No medications on file    Review of Systems  Unable to perform ROS: Dementia    Vitals:   10/29/16 1507  BP: (!) 152/84  Pulse: 88  Temp: 98.3 F (36.8 C)  TempSrc: Oral  SpO2: 97%  Weight: 138 lb (62.6 kg)  Height: 5' 7"  (1.702 m)   Body mass index is 21.61 kg/m.  Physical Exam  Constitutional: She appears well-developed.  Frail appearing in NAD  HENT:  Mouth/Throat: Oropharynx is clear and moist. No oropharyngeal exudate.  Eyes: Pupils are equal, round, and reactive to light. No scleral icterus.  Neck: Normal range of motion. Neck supple.  No carotid bruit b/l  Cardiovascular: Normal rate, regular rhythm and intact distal pulses.  Exam reveals no gallop and no friction rub.   Murmur (1/6 SEM) heard. No LE edema b/l. No calf TTP  Pulmonary/Chest: Effort normal and breath sounds normal. No respiratory distress. She has no wheezes. She has no rales. She exhibits no tenderness.  Abdominal: Soft. Bowel sounds are normal. She exhibits no distension and no mass. There is no tenderness. There is no rebound and no guarding.  Musculoskeletal: She exhibits edema.  Lymphadenopathy:    She has no cervical adenopathy.  Neurological: She is alert.  Skin: Skin is warm and dry. Rash (R>L inguinal dark with less redness and no maceration, dry; extends to between  buttocks and also abdominal pannus) noted.    Psychiatric: Her mood appears anxious. She is not agitated. Thought content is delusional.     Labs reviewed: No visits with results within 3 Month(s) from this visit.  Latest known visit with results is:  Office Visit on 07/04/2016  Component Date Value Ref Range Status  . Sodium 07/04/2016 140  135 - 146 mmol/L Final  . Potassium 07/04/2016 3.7  3.5 - 5.3 mmol/L Final  .  Chloride 07/04/2016 107  98 - 110 mmol/L Final  . CO2 07/04/2016 21  20 - 31 mmol/L Final  . Glucose, Bld 07/04/2016 96  65 - 99 mg/dL Final  . BUN 07/04/2016 10  7 - 25 mg/dL Final  . Creat 07/04/2016 0.92  0.60 - 0.93 mg/dL Final   Comment:   For patients > or = 79 years of age: The upper reference limit for Creatinine is approximately 13% higher for people identified as African-American.     . Total Bilirubin 07/04/2016 0.6  0.2 - 1.2 mg/dL Final  . Alkaline Phosphatase 07/04/2016 68  33 - 130 U/L Final  . AST 07/04/2016 13  10 - 35 U/L Final  . ALT 07/04/2016 5* 6 - 29 U/L Final  . Total Protein 07/04/2016 7.4  6.1 - 8.1 g/dL Final  . Albumin 07/04/2016 3.8  3.6 - 5.1 g/dL Final  . Calcium 07/04/2016 9.5  8.6 - 10.4 mg/dL Final  . GFR, Est African American 07/04/2016 69  >=60 mL/min Final  . GFR, Est Non African American 07/04/2016 60  >=60 mL/min Final  . Cholesterol 07/04/2016 144  <200 mg/dL Final  . Triglycerides 07/04/2016 70  <150 mg/dL Final  . HDL 07/04/2016 74  >50 mg/dL Final  . Total CHOL/HDL Ratio 07/04/2016 1.9  <5.0 Ratio Final  . VLDL 07/04/2016 14  <30 mg/dL Final  . LDL Cholesterol 07/04/2016 56  <100 mg/dL Final  . WBC 07/04/2016 9.5  3.8 - 10.8 K/uL Final  . RBC 07/04/2016 4.50  3.80 - 5.10 MIL/uL Final  . Hemoglobin 07/04/2016 13.7  11.7 - 15.5 g/dL Final  . HCT 07/04/2016 41.1  35.0 - 45.0 % Final  . MCV 07/04/2016 91.3  80.0 - 100.0 fL Final  . MCH 07/04/2016 30.4  27.0 - 33.0 pg Final  . MCHC 07/04/2016 33.3  32.0 - 36.0 g/dL Final  . RDW 07/04/2016 13.7  11.0 - 15.0 % Final   . Platelets 07/04/2016 213  140 - 400 K/uL Final  . MPV 07/04/2016 11.2  7.5 - 12.5 fL Final  . Neutro Abs 07/04/2016 4750  1,500 - 7,800 cells/uL Final  . Lymphs Abs 07/04/2016 4085* 850 - 3,900 cells/uL Final  . Monocytes Absolute 07/04/2016 475  200 - 950 cells/uL Final  . Eosinophils Absolute 07/04/2016 190  15 - 500 cells/uL Final  . Basophils Absolute 07/04/2016 0  0 - 200 cells/uL Final  . Neutrophils Relative % 07/04/2016 50  % Final  . Lymphocytes Relative 07/04/2016 43  % Final  . Monocytes Relative 07/04/2016 5  % Final  . Eosinophils Relative 07/04/2016 2  % Final  . Basophils Relative 07/04/2016 0  % Final  . Smear Review 07/04/2016 Criteria for review not met   Final  . TSH 07/04/2016 1.72  mIU/L Final   Comment:   Reference Range   > or = 20 Years  0.40-4.50   Pregnancy Range First trimester  0.26-2.66 Second trimester 0.55-2.73 Third trimester  0.43-2.91       No results found.   Assessment/Plan   ICD-9-CM ICD-10-CM   1. Cutaneous candidiasis 112.3 B37.2 nystatin (MYCOSTATIN/NYSTOP) powder  2. Mixed hyperlipidemia 272.2 E78.2   3. Mixed Alzheimer's and vascular dementia 331.0 G30.9    294.10 F01.50    290.40 F02.80   4. Essential hypertension, benign 401.1 I10   5. Psychosis, unspecified psychosis type 298.9 F29   6. Tobacco use disorder 305.1 F17.200   7. Loss of weight 783.21 R63.4  8. High risk medication use V58.69 Z79.899    Continue current medications as ordered. Refill on lipitor today eRx to pharmacy  Recommend resume delivered meals  Follow up in 2 mos for dementia, weight loss  Izzy Courville S. Perlie Gold  [facility] [address] [place], [place] [address] [phone] Cell (Monday-Friday 8 AM - 5 PM) [phone] After 5 PM and follow prompts

## 2016-10-29 NOTE — Patient Instructions (Signed)
Continue current medications as ordered  Recommend resume delivered meals  Follow up in 2 mos for dementia, weight loss

## 2016-12-19 ENCOUNTER — Telehealth: Payer: Self-pay

## 2016-12-19 NOTE — Telephone Encounter (Signed)
Left message for patient. Called todiscuss request for community resources and provide that information. Will try again on Monday.   Thanks, Sherle PoeNicole Rilda Bulls, B.A.  Care Guide

## 2016-12-31 ENCOUNTER — Ambulatory Visit (INDEPENDENT_AMBULATORY_CARE_PROVIDER_SITE_OTHER): Payer: Medicare Other | Admitting: Internal Medicine

## 2016-12-31 ENCOUNTER — Encounter: Payer: Self-pay | Admitting: Internal Medicine

## 2016-12-31 VITALS — BP 152/88 | HR 85 | Temp 98.3°F | Ht 67.0 in | Wt 142.2 lb

## 2016-12-31 DIAGNOSIS — I10 Essential (primary) hypertension: Secondary | ICD-10-CM | POA: Diagnosis not present

## 2016-12-31 DIAGNOSIS — Z79899 Other long term (current) drug therapy: Secondary | ICD-10-CM | POA: Diagnosis not present

## 2016-12-31 DIAGNOSIS — E782 Mixed hyperlipidemia: Secondary | ICD-10-CM | POA: Diagnosis not present

## 2016-12-31 DIAGNOSIS — F0150 Vascular dementia without behavioral disturbance: Secondary | ICD-10-CM | POA: Diagnosis not present

## 2016-12-31 DIAGNOSIS — F0280 Dementia in other diseases classified elsewhere without behavioral disturbance: Secondary | ICD-10-CM | POA: Diagnosis not present

## 2016-12-31 DIAGNOSIS — G309 Alzheimer's disease, unspecified: Secondary | ICD-10-CM

## 2016-12-31 LAB — LIPID PANEL
CHOL/HDL RATIO: 1.8 ratio (ref <5.0)
CHOLESTEROL: 157 mg/dL (ref <200)
HDL: 86 mg/dL (ref >50)
LDL Cholesterol: 61 mg/dL (ref <100)
Triglycerides: 52 mg/dL (ref <150)
VLDL: 10 mg/dL (ref <30)

## 2016-12-31 LAB — COMPLETE METABOLIC PANEL WITH GFR
ALT: 6 U/L (ref 6–29)
AST: 15 U/L (ref 10–35)
Albumin: 4 g/dL (ref 3.6–5.1)
Alkaline Phosphatase: 82 U/L (ref 33–130)
BUN: 9 mg/dL (ref 7–25)
CO2: 20 mmol/L (ref 20–31)
Calcium: 9.5 mg/dL (ref 8.6–10.4)
Chloride: 109 mmol/L (ref 98–110)
Creat: 0.82 mg/dL (ref 0.60–0.93)
GFR, EST NON AFRICAN AMERICAN: 68 mL/min (ref >=60)
GFR, Est African American: 79 mL/min (ref >=60)
Glucose, Bld: 93 mg/dL (ref 65–99)
POTASSIUM: 4.4 mmol/L (ref 3.5–5.3)
Sodium: 139 mmol/L (ref 135–146)
TOTAL PROTEIN: 7.7 g/dL (ref 6.1–8.1)
Total Bilirubin: 0.8 mg/dL (ref 0.2–1.2)

## 2016-12-31 MED ORDER — ZOSTER VAC RECOMB ADJUVANTED 50 MCG/0.5ML IM SUSR: 0.5000 mL | Freq: Once | INTRAMUSCULAR | 1 refills | Status: AC

## 2016-12-31 NOTE — Patient Instructions (Signed)
Continue current medications as ordered  Will call with lab results  Follow up in 2 mos for dementia, HTN and hyperlipidemia

## 2016-12-31 NOTE — Progress Notes (Signed)
Patient ID: Carol [name], female   DOB: [birth year], 81 y.o.   MRN: [id number]    Location:  [facility] Place of Service: OFFICE  Chief Complaint  Patient presents with  . Medical Management of Chronic Issues    2 months routine visit    HPI:  81 yo female seen today for f/u. Weight is up 4 lbs since last ov but daughter reports she eats a lot of [employer]. Spouse's health declining. No significant changes in her health. She is a poor historian due to dementia. Hx obtained from chart and family.  Her behavior has improved on higher dose of seroquel. Dementia unchanged. She continues to have paranoia and has significant issues with short term memory. Family has private duty care 2 times per week (Mon/Fri) where she gets bath and washes hair. Meals-on-wheels frozen as they cannot get on hot meal list. She is still driving as spouse does not stop her. She is hording paper towels, toilet paper, water and laundry detergent. She is taking namzaric and has exelon patch. Na 140.   Cutaneous candidiasis - no change. tx with po diflucan and flagyl in the past. She uses nystatin powder on a chronic basis. She still scratches groin area  HTN - stable on amlodipine. BP stable. She is chain smoking.  Hyperlipidemia - takes lipitor. LDL 59.  Osteopenia - not taking fosamax x 1 yr as she was out of Rx. Not on caltrate. Last DXA in 2015  Tobacco abuse - chain smoking with frail spouse. No plans to quit  Past Medical History:  Diagnosis Date  . Anxiety state, unspecified   . Cataract   . Depressive disorder, not elsewhere classified   . Lumbago   . Memory loss   . Other and unspecified hyperlipidemia   . Rickets, late effect   . Tobacco use disorder   . Unspecified constipation   . Unspecified essential hypertension   . Varicose veins of lower extremities     Past Surgical History:  Procedure Laterality Date  . CYST EXCISION Bilateral    behind ears  . MOUTH SURGERY  04/12/2014     Patient Care Team: Gildardo Cranker, DO as PCP - General (Internal Medicine)  Social History   Social History  . Marital status: Married    Spouse name: N/A  . Number of children: N/A  . Years of education: N/A   Occupational History  . Not on file.   Social History Main Topics  . Smoking status: Current Every Day Smoker    Years: 55.00  . Smokeless tobacco: Never Used     Comment: 3 cigareetes a day  . Alcohol use No  . Drug use: No  . Sexual activity: Not on file   Other Topics Concern  . Not on file   Social History Narrative  . No narrative on file     reports that she has been smoking.  She has smoked for the past 55.00 years. She has never used smokeless tobacco. She reports that she does not drink alcohol or use drugs.  Family History  Problem Relation Age of Onset  . Heart disease Father    Family Status  Relation Status  . Father Deceased at age 39       heart disease  . Mother Deceased at age 34       old age  . Daughter Alive  . Daughter Alive  . Daughter Alive  . Daughter Alive     Allergies  Allergen Reactions  . Penicillins     Rash & swelling    Medications: Patient's Medications  New Prescriptions   No medications on file  Previous Medications   ALENDRONATE (FOSAMAX) 70 MG TABLET    Take 1 tablet (70 mg total) by mouth every 7 (seven) days. Take with a full glass of water on an empty stomach.   AMLODIPINE (NORVASC) 5 MG TABLET    Take 1 tablet (5 mg total) by mouth daily.   ATORVASTATIN (LIPITOR) 20 MG TABLET    TAKE 1 TABLET BY MOUTH EVERY DAY FOR CHOLESTEROL   CALCIUM CARBONATE-VITAMIN D (CALTRATE 600+D) 600-400 MG-UNIT PER TABLET    Take 1 tablet by mouth 2 (two) times a week.   MEMANTINE HCL-DONEPEZIL HCL (NAMZARIC) 28-10 MG CP24    Take 1 capsule by mouth daily.   MULTIPLE VITAMINS-MINERALS (MULTIVITAMIN WITH MINERALS) TABLET    Take 1 tablet by mouth daily.   NYSTATIN (MYCOSTATIN/NYSTOP) POWDER    Apply topically 2 (two)  times daily. To groin and abdomen area   POLYETHYLENE GLYCOL POWDER (GLYCOLAX/MIRALAX) POWDER    Take 17 g by mouth daily.   QUETIAPINE (SEROQUEL) 100 MG TABLET    Take 0.5 tab po qAM and 1 ablet by mouth at bedtime   RIVASTIGMINE 13.3 MG/24HR PT24    Place 1 patch (13.3 mg total) onto the skin daily.  Modified Medications   Modified Medication Previous Medication   ZOSTER VAC RECOMB ADJUVANTED (SHINGRIX) INJECTION Zoster Vac Recomb Adjuvanted (SHINGRIX) injection      Inject 0.5 mLs into the muscle once.    Inject 0.5 mLs into the muscle once.  Discontinued Medications   No medications on file    Review of Systems  Unable to perform ROS: Dementia    Vitals:   12/31/16 1436  BP: (!) 152/88  Pulse: 85  Temp: 98.3 F (36.8 C)  TempSrc: Oral  SpO2: 98%  Weight: 142 lb 3.2 oz (64.5 kg)  Height: '5\' 7"'$  (1.702 m)   Body mass index is 22.27 kg/m.  Physical Exam  Constitutional: She appears well-developed.  Frail appearing in NAD  HENT:  Mouth/Throat: Oropharynx is clear and moist. No oropharyngeal exudate.  Eyes: Pupils are equal, round, and reactive to light. No scleral icterus.  Neck: Normal range of motion. Neck supple.  No carotid bruit b/l  Cardiovascular: Normal rate, regular rhythm and intact distal pulses.  Exam reveals no gallop and no friction rub.   Murmur (1/6 SEM) heard. No LE edema b/l. No calf TTP  Pulmonary/Chest: Effort normal and breath sounds normal. No respiratory distress. She has no wheezes. She has no rales. She exhibits no tenderness.  Abdominal: Soft. Bowel sounds are normal. She exhibits no distension and no mass. There is no tenderness. There is no rebound and no guarding.  Musculoskeletal: She exhibits edema.  Lymphadenopathy:    She has no cervical adenopathy.  Neurological: She is alert.  Skin: Skin is warm and dry. No rash noted.  Psychiatric: Her mood appears anxious. She is not agitated. Thought content is delusional.     Labs reviewed: No  visits with results within 3 Month(s) from this visit.  Latest known visit with results is:  Office Visit on 07/04/2016  Component Date Value Ref Range Status  . Sodium 07/04/2016 140  135 - 146 mmol/L Final  . Potassium 07/04/2016 3.7  3.5 - 5.3 mmol/L Final  . Chloride 07/04/2016 107  98 - 110 mmol/L Final  . CO2 07/04/2016  21  20 - 31 mmol/L Final  . Glucose, Bld 07/04/2016 96  65 - 99 mg/dL Final  . BUN 07/04/2016 10  7 - 25 mg/dL Final  . Creat 07/04/2016 0.92  0.60 - 0.93 mg/dL Final   Comment:   For patients > or = 78 years of age: The upper reference limit for Creatinine is approximately 13% higher for people identified as African-American.     . Total Bilirubin 07/04/2016 0.6  0.2 - 1.2 mg/dL Final  . Alkaline Phosphatase 07/04/2016 68  33 - 130 U/L Final  . AST 07/04/2016 13  10 - 35 U/L Final  . ALT 07/04/2016 5* 6 - 29 U/L Final  . Total Protein 07/04/2016 7.4  6.1 - 8.1 g/dL Final  . Albumin 07/04/2016 3.8  3.6 - 5.1 g/dL Final  . Calcium 07/04/2016 9.5  8.6 - 10.4 mg/dL Final  . GFR, Est African American 07/04/2016 69  >=60 mL/min Final  . GFR, Est Non African American 07/04/2016 60  >=60 mL/min Final  . Cholesterol 07/04/2016 144  <200 mg/dL Final  . Triglycerides 07/04/2016 70  <150 mg/dL Final  . HDL 07/04/2016 74  >50 mg/dL Final  . Total CHOL/HDL Ratio 07/04/2016 1.9  <5.0 Ratio Final  . VLDL 07/04/2016 14  <30 mg/dL Final  . LDL Cholesterol 07/04/2016 56  <100 mg/dL Final  . WBC 07/04/2016 9.5  3.8 - 10.8 K/uL Final  . RBC 07/04/2016 4.50  3.80 - 5.10 MIL/uL Final  . Hemoglobin 07/04/2016 13.7  11.7 - 15.5 g/dL Final  . HCT 07/04/2016 41.1  35.0 - 45.0 % Final  . MCV 07/04/2016 91.3  80.0 - 100.0 fL Final  . MCH 07/04/2016 30.4  27.0 - 33.0 pg Final  . MCHC 07/04/2016 33.3  32.0 - 36.0 g/dL Final  . RDW 07/04/2016 13.7  11.0 - 15.0 % Final  . Platelets 07/04/2016 213  140 - 400 K/uL Final  . MPV 07/04/2016 11.2  7.5 - 12.5 fL Final  . Neutro Abs  07/04/2016 4750  1,500 - 7,800 cells/uL Final  . Lymphs Abs 07/04/2016 4085* 850 - 3,900 cells/uL Final  . Monocytes Absolute 07/04/2016 475  200 - 950 cells/uL Final  . Eosinophils Absolute 07/04/2016 190  15 - 500 cells/uL Final  . Basophils Absolute 07/04/2016 0  0 - 200 cells/uL Final  . Neutrophils Relative % 07/04/2016 50  % Final  . Lymphocytes Relative 07/04/2016 43  % Final  . Monocytes Relative 07/04/2016 5  % Final  . Eosinophils Relative 07/04/2016 2  % Final  . Basophils Relative 07/04/2016 0  % Final  . Smear Review 07/04/2016 Criteria for review not met   Final  . TSH 07/04/2016 1.72  mIU/L Final   Comment:   Reference Range   > or = 20 Years  0.40-4.50   Pregnancy Range First trimester  0.26-2.66 Second trimester 0.55-2.73 Third trimester  0.43-2.91       No results found.   Assessment/Plan   ICD-10-CM   1. High risk medication use Z79.899 CMP with eGFR  2. Essential hypertension, benign I10   3. Mixed hyperlipidemia E78.2 Lipid Panel  4. Mixed Alzheimer's and vascular dementia G30.9    F01.50    F02.80   Continue current medications as ordered  Will call with lab results  Follow up in 2 mos for dementia, HTN and hyperlipidemia  Jeyda Siebel S. [facility]  [place], [place] [address] [phone] Cell (Monday-Friday 8 AM - 5 PM) [phone] After 5 PM and follow prompts

## 2017-02-11 ENCOUNTER — Other Ambulatory Visit: Payer: Self-pay | Admitting: Internal Medicine

## 2017-02-11 DIAGNOSIS — G309 Alzheimer's disease, unspecified: Principal | ICD-10-CM

## 2017-02-11 DIAGNOSIS — F0150 Vascular dementia without behavioral disturbance: Secondary | ICD-10-CM

## 2017-02-11 DIAGNOSIS — F0280 Dementia in other diseases classified elsewhere without behavioral disturbance: Principal | ICD-10-CM

## 2017-03-03 ENCOUNTER — Ambulatory Visit (INDEPENDENT_AMBULATORY_CARE_PROVIDER_SITE_OTHER): Payer: Medicare Other | Admitting: Internal Medicine

## 2017-03-03 ENCOUNTER — Encounter: Payer: Self-pay | Admitting: Internal Medicine

## 2017-03-03 VITALS — BP 138/82 | HR 92 | Temp 98.7°F | Resp 20 | Ht 67.0 in | Wt 147.4 lb

## 2017-03-03 DIAGNOSIS — I10 Essential (primary) hypertension: Secondary | ICD-10-CM | POA: Diagnosis not present

## 2017-03-03 DIAGNOSIS — Z23 Encounter for immunization: Secondary | ICD-10-CM | POA: Diagnosis not present

## 2017-03-03 DIAGNOSIS — W57XXXA Bitten or stung by nonvenomous insect and other nonvenomous arthropods, initial encounter: Secondary | ICD-10-CM

## 2017-03-03 DIAGNOSIS — F29 Unspecified psychosis not due to a substance or known physiological condition: Secondary | ICD-10-CM | POA: Diagnosis not present

## 2017-03-03 DIAGNOSIS — M8580 Other specified disorders of bone density and structure, unspecified site: Secondary | ICD-10-CM

## 2017-03-03 DIAGNOSIS — F0280 Dementia in other diseases classified elsewhere without behavioral disturbance: Secondary | ICD-10-CM | POA: Diagnosis not present

## 2017-03-03 DIAGNOSIS — F0150 Vascular dementia without behavioral disturbance: Secondary | ICD-10-CM

## 2017-03-03 DIAGNOSIS — G309 Alzheimer's disease, unspecified: Secondary | ICD-10-CM

## 2017-03-03 MED ORDER — QUETIAPINE FUMARATE 100 MG PO TABS: ORAL_TABLET | ORAL | 0 refills | Status: DC

## 2017-03-03 MED ORDER — QUETIAPINE FUMARATE 100 MG PO TABS: ORAL_TABLET | ORAL | 6 refills | Status: DC

## 2017-03-03 MED ORDER — AMLODIPINE BESYLATE 5 MG PO TABS: 5.0000 mg | ORAL_TABLET | Freq: Every day | ORAL | 3 refills | Status: DC

## 2017-03-03 MED ORDER — ALENDRONATE SODIUM 70 MG PO TABS: 70.0000 mg | ORAL_TABLET | ORAL | 11 refills | Status: DC

## 2017-03-03 NOTE — Patient Instructions (Addendum)
Influenza vaccine given today  OTC HYDROCORTISONE AS NEEDED FOR INSECT BITES  INCREASE SEROQUEL 1.5 TABS DAILY for behavior   Continue other medications as ordered  Follow up in 2 mos for dementia, hyperlipidemia, HTN
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
## 2017-04-28 ENCOUNTER — Ambulatory Visit (INDEPENDENT_AMBULATORY_CARE_PROVIDER_SITE_OTHER): Payer: Medicare Other | Admitting: Internal Medicine

## 2017-04-28 ENCOUNTER — Encounter: Payer: Self-pay | Admitting: Internal Medicine

## 2017-04-28 VITALS — BP 120/78 | HR 103 | Temp 98.5°F

## 2017-04-28 DIAGNOSIS — G309 Alzheimer's disease, unspecified: Secondary | ICD-10-CM

## 2017-04-28 DIAGNOSIS — F0150 Vascular dementia without behavioral disturbance: Secondary | ICD-10-CM

## 2017-04-28 DIAGNOSIS — I10 Essential (primary) hypertension: Secondary | ICD-10-CM | POA: Diagnosis not present

## 2017-04-28 DIAGNOSIS — E782 Mixed hyperlipidemia: Secondary | ICD-10-CM | POA: Diagnosis not present

## 2017-04-28 DIAGNOSIS — B372 Candidiasis of skin and nail: Secondary | ICD-10-CM | POA: Diagnosis not present

## 2017-04-28 DIAGNOSIS — F29 Unspecified psychosis not due to a substance or known physiological condition: Secondary | ICD-10-CM | POA: Diagnosis not present

## 2017-04-28 DIAGNOSIS — F0280 Dementia in other diseases classified elsewhere without behavioral disturbance: Secondary | ICD-10-CM | POA: Diagnosis not present

## 2017-04-28 MED ORDER — AMLODIPINE BESYLATE 5 MG PO TABS: 5.0000 mg | ORAL_TABLET | Freq: Every day | ORAL | 3 refills | Status: DC

## 2017-04-28 MED ORDER — ATORVASTATIN CALCIUM 20 MG PO TABS: ORAL_TABLET | ORAL | 3 refills | Status: DC

## 2017-04-28 MED ORDER — QUETIAPINE FUMARATE 100 MG PO TABS: ORAL_TABLET | ORAL | 6 refills | Status: DC

## 2017-04-28 NOTE — Patient Instructions (Addendum)
INCREASE SEROQUEL TO 1 TABLET 2 TIMES DAILY for mood  Continue other medications as ordered  USE POWDER AS DIRECTED FOR RASH  RECOMMEND CHANGING SOAPS TO LESS HARSH (CETAPHIL, DOVE, ETC)  Follow up in 2 mos for mood, hyperlipidemia, memory.
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
## 2017-06-30 ENCOUNTER — Encounter: Payer: Self-pay | Admitting: Internal Medicine

## 2017-06-30 ENCOUNTER — Ambulatory Visit (INDEPENDENT_AMBULATORY_CARE_PROVIDER_SITE_OTHER): Payer: Medicare Other | Admitting: Internal Medicine

## 2017-06-30 VITALS — BP 142/80 | HR 93 | Temp 98.8°F | Resp 20 | Ht 67.0 in | Wt 161.6 lb

## 2017-06-30 DIAGNOSIS — G309 Alzheimer's disease, unspecified: Secondary | ICD-10-CM | POA: Diagnosis not present

## 2017-06-30 DIAGNOSIS — F0150 Vascular dementia without behavioral disturbance: Secondary | ICD-10-CM | POA: Diagnosis not present

## 2017-06-30 DIAGNOSIS — F0280 Dementia in other diseases classified elsewhere without behavioral disturbance: Secondary | ICD-10-CM

## 2017-06-30 DIAGNOSIS — B372 Candidiasis of skin and nail: Secondary | ICD-10-CM | POA: Diagnosis not present

## 2017-06-30 MED ORDER — QUETIAPINE FUMARATE 100 MG PO TABS: ORAL_TABLET | ORAL | 1 refills | Status: DC

## 2017-06-30 MED ORDER — MEMANTINE HCL-DONEPEZIL HCL ER 28-10 MG PO CP24: 1.0000 | ORAL_CAPSULE | Freq: Every day | ORAL | 10 refills | Status: DC

## 2017-06-30 MED ORDER — FLUCONAZOLE 100 MG PO TABS: 100.0000 mg | ORAL_TABLET | Freq: Every day | ORAL | 0 refills | Status: DC

## 2017-06-30 MED ORDER — RIVASTIGMINE 13.3 MG/24HR TD PT24: 13.3000 mg | MEDICATED_PATCH | Freq: Every day | TRANSDERMAL | 10 refills | Status: DC

## 2017-06-30 NOTE — Patient Instructions (Addendum)
START DIFLUCAN (flucanozole) 100MG  TAKE 2 TABS ON DAY 1 THEN 1 TAB DAILY X 6 DAYS  May hold nystatin powder while on diflucan  Continue other medications as ordered  Follow up in 2 mos for memory, rash and weight gain

## 2017-06-30 NOTE — Progress Notes (Signed)
Patient ID: Carol [name], female   DOB: [birth year], 82 y.o.   MRN: [id number]   Location:  [facility] OFFICE  Provider: DR Elmon Kirschner   Goals of Care:  Advanced Directives 03/03/2017  Does Patient Have a Medical Advance Directive? No  Would patient like information on creating a medical advance directive? Yes (ED - Information included in AVS)     Chief Complaint  Patient presents with  . Medical Management of Chronic Issues    2 mo F/u-medication, has rash in groin,,m     HPI: Patient is a 82 y.o. female seen today for medical management of chronic diseases.  seroquel increased at last ov and daughters note improvement in behavior. Spouse fell and currently is in rehab. Pt is a poor historian due to dementia. Hx obtained from chart.  Overweight - Weight up 14 lbs since Sept 2018. She is eating [employer] everyday.   Mixed Alzheimer's/vascular dementia - behavior improved. Dementia unchanged. She continues to have paranoia and has significant issues with short term memory. Family has private duty care 2 times per week (Mon/Fri) where she gets bath and washes hair. Meals-on-wheels still frozen as they cannot get on hot meal list. She is still driving as spouse does not stop her. She is hording paper towels, toilet paper, water and laundry detergent. She is taking namzaric and has exelon patch. Na 139.   Cutaneous candidiasis - she is scratching more and now has increased redness noted by private duty caretaker. She is using nystatin powder daily. She was tx with po diflucan and flagyl in the past.  Tobacco abuse - she has stopped smoking since her last ov  Past Medical History:  Diagnosis Date  . Anxiety state, unspecified   . Cataract   . Depressive disorder, not elsewhere classified   . Lumbago   . Memory loss   . Other and unspecified hyperlipidemia   . Rickets, late effect   . Tobacco use disorder   . Unspecified constipation   . Unspecified essential hypertension   .  Varicose veins of lower extremities     Past Surgical History:  Procedure Laterality Date  . CYST EXCISION Bilateral    behind ears  . MOUTH SURGERY  04/12/2014     reports that she has been smoking.  She has smoked for the past 55.00 years. she has never used smokeless tobacco. She reports that she does not drink alcohol or use drugs. Social History   Socioeconomic History  . Marital status: Married    Spouse name: Not on file  . Number of children: Not on file  . Years of education: Not on file  . Highest education level: Not on file  Social Needs  . Financial resource strain: Not on file  . Food insecurity - worry: Not on file  . Food insecurity - inability: Not on file  . Transportation needs - medical: Not on file  . Transportation needs - non-medical: Not on file  Occupational History  . Not on file  Tobacco Use  . Smoking status: Current Every Day Smoker    Years: 55.00  . Smokeless tobacco: Never Used  . Tobacco comment: 3 cigareetes a day  Substance and Sexual Activity  . Alcohol use: No    Alcohol/week: 0.0 oz  . Drug use: No  . Sexual activity: Not on file  Other Topics Concern  . Not on file  Social History Narrative  . Not on file    Family History  Problem Relation Age of Onset  . Heart disease Father     Allergies  Allergen Reactions  . Penicillins     Rash & swelling    Outpatient Encounter Medications as of 06/30/2017  Medication Sig  . alendronate (FOSAMAX) 70 MG tablet Take 1 tablet (70 mg total) by mouth every 7 (seven) days. Take with a full glass of water on an empty stomach.  Marland Kitchen amLODipine (NORVASC) 5 MG tablet Take 1 tablet (5 mg total) by mouth daily.  Marland Kitchen atorvastatin (LIPITOR) 20 MG tablet TAKE 1 TABLET BY MOUTH EVERY DAY FOR CHOLESTEROL  . Memantine HCl-Donepezil HCl (NAMZARIC) 28-10 MG CP24 Take 1 capsule by mouth daily.  . Multiple Vitamins-Minerals (MULTIVITAMIN WITH MINERALS) tablet Take 1 tablet by mouth daily.  Marland Kitchen nystatin  (MYCOSTATIN/NYSTOP) powder Apply topically 2 (two) times daily. To groin and abdomen area  . polyethylene glycol powder (GLYCOLAX/MIRALAX) powder Take 17 g by mouth daily.  . QUEtiapine (SEROQUEL) 100 MG tablet TAKE 1 TABLET BY MOUTH EVERY MORNING AND 1 TABLET AT BEDTIME  . rivastigmine (EXELON) 13.3 MG/24HR Place 1 patch (13.3 mg total) onto the skin daily.  . [DISCONTINUED] Memantine HCl-Donepezil HCl (NAMZARIC) 28-10 MG CP24 Take 1 capsule by mouth daily.  . [DISCONTINUED] QUEtiapine (SEROQUEL) 100 MG tablet TAKE 1 TABLET BY MOUTH EVERY MORNING AND 1 TABLET AT BEDTIME  . [DISCONTINUED] Rivastigmine 13.3 MG/24HR PT24 Place 1 patch (13.3 mg total) onto the skin daily.  . fluconazole (DIFLUCAN) 100 MG tablet Take 1 tablet (100 mg total) by mouth daily. For fungal infection   No facility-administered encounter medications on file as of 06/30/2017.     Review of Systems:  Review of Systems  Unable to perform ROS: Dementia    Health Maintenance  Topic Date Due  . TETANUS/TDAP  06/08/2024  . INFLUENZA VACCINE  Completed  . DEXA SCAN  Completed  . PNA vac Low Risk Adult  Completed    Physical Exam: Vitals:   06/30/17 1534  BP: (!) 142/80  Pulse: 93  Resp: 20  Temp: 98.8 F (37.1 C)  TempSrc: Oral  SpO2: 97%  Weight: 161 lb 9.6 oz (73.3 kg)  Height: 5\' 7"  (1.702 m)   Body mass index is 25.31 kg/m. Physical Exam  Constitutional: She appears well-developed and well-nourished.  Musculoskeletal: She exhibits edema.  Skin: Skin is warm. Rash (b/l inguinal R>L with increased redness but no maceration or d/c. no secondary signs of infection; moist) noted. There is erythema.  Psychiatric: Her speech is normal. Her mood appears anxious. She is agitated. Thought content is delusional. Thought content is not paranoid. Cognition and memory are impaired. She expresses inappropriate judgment.    Labs reviewed: Basic Metabolic Panel: Recent Labs    07/04/16 1511 12/31/16 1517  NA 140  139  K 3.7 4.4  CL 107 109  CO2 21 20  GLUCOSE 96 93  BUN 10 9  CREATININE 0.92 0.82  CALCIUM 9.5 9.5  TSH 1.72  --    Liver Function Tests: Recent Labs    07/04/16 1511 12/31/16 1517  AST 13 15  ALT 5* 6  ALKPHOS 68 82  BILITOT 0.6 0.8  PROT 7.4 7.7  ALBUMIN 3.8 4.0   No results for input(s): LIPASE, AMYLASE in the last 8760 hours. No results for input(s): AMMONIA in the last 8760 hours. CBC: Recent Labs    07/04/16 1511  WBC 9.5  NEUTROABS 4,750  HGB 13.7  HCT 41.1  MCV 91.3  PLT 213  Lipid Panel: Recent Labs    07/04/16 1511 12/31/16 1517  CHOL 144 157  HDL 74 86  LDLCALC 56 61  TRIG 70 52  CHOLHDL 1.9 1.8   Lab Results  Component Value Date   HGBA1C (H) 09/19/2009    6.5 (NOTE)                                                                       According to the ADA Clinical Practice Recommendations for 2011, when HbA1c is used as a screening test:   >=6.5%   Diagnostic of Diabetes Mellitus           (if abnormal result  is confirmed)  5.7-6.4%   Increased risk of developing Diabetes Mellitus  References:Diagnosis and Classification of Diabetes Mellitus,Diabetes Care,2011,34(Suppl 1):S62-S69 and Standards of Medical Care in         Diabetes - 2011,Diabetes Care,2011,34  (Suppl 1):S11-S61.    Procedures since last visit: No results found.  Assessment/Plan     ICD-10-CM   1. Mixed Alzheimer's and vascular dementia G30.9 QUEtiapine (SEROQUEL) 100 MG tablet   F01.50 Memantine HCl-Donepezil HCl (NAMZARIC) 28-10 MG CP24   F02.80 rivastigmine (EXELON) 13.3 MG/24HR  2. Cutaneous candidiasis B37.2 fluconazole (DIFLUCAN) 100 MG tablet   START DIFLUCAN (flucanozole) 100MG  TAKE 2 TABS ON DAY 1 THEN 1 TAB DAILY X 6 DAYS  May hold nystatin powder while on diflucan  Continue other medications as ordered  Will need labs next ov (cmp, lipid panel, cbc, tsh)  Follow up in 2 mos for memory, rash and weight gain   Konni Kesinger S. Ancil Linseyarter, D. O., F. A. C. O. I.    [facility] [address] [place], [place] [address] [phone] Cell (Monday-Friday 8 AM - 5 PM) [phone] After 5 PM and follow prompts

## 2017-09-01 ENCOUNTER — Ambulatory Visit: Payer: Medicare Other | Admitting: Internal Medicine

## 2017-09-09 ENCOUNTER — Encounter: Payer: Self-pay | Admitting: Internal Medicine

## 2017-09-09 ENCOUNTER — Ambulatory Visit (INDEPENDENT_AMBULATORY_CARE_PROVIDER_SITE_OTHER): Payer: Medicare Other | Admitting: Internal Medicine

## 2017-09-09 ENCOUNTER — Other Ambulatory Visit: Payer: Self-pay | Admitting: Internal Medicine

## 2017-09-09 VITALS — BP 132/78 | HR 76 | Temp 98.4°F | Ht 67.0 in | Wt 176.0 lb

## 2017-09-09 DIAGNOSIS — E782 Mixed hyperlipidemia: Secondary | ICD-10-CM

## 2017-09-09 DIAGNOSIS — I10 Essential (primary) hypertension: Secondary | ICD-10-CM | POA: Diagnosis not present

## 2017-09-09 DIAGNOSIS — R300 Dysuria: Secondary | ICD-10-CM

## 2017-09-09 DIAGNOSIS — G309 Alzheimer's disease, unspecified: Secondary | ICD-10-CM | POA: Diagnosis not present

## 2017-09-09 DIAGNOSIS — M545 Low back pain: Secondary | ICD-10-CM

## 2017-09-09 DIAGNOSIS — B372 Candidiasis of skin and nail: Secondary | ICD-10-CM

## 2017-09-09 DIAGNOSIS — F0150 Vascular dementia without behavioral disturbance: Secondary | ICD-10-CM | POA: Diagnosis not present

## 2017-09-09 DIAGNOSIS — F29 Unspecified psychosis not due to a substance or known physiological condition: Secondary | ICD-10-CM

## 2017-09-09 DIAGNOSIS — Z79899 Other long term (current) drug therapy: Secondary | ICD-10-CM

## 2017-09-09 DIAGNOSIS — F0280 Dementia in other diseases classified elsewhere without behavioral disturbance: Secondary | ICD-10-CM | POA: Diagnosis not present

## 2017-09-09 DIAGNOSIS — M8580 Other specified disorders of bone density and structure, unspecified site: Secondary | ICD-10-CM

## 2017-09-09 NOTE — Progress Notes (Signed)
Patient ID: Carol [name], female   DOB: 1935-06-22, 82 y.o.   MRN: [id number]   [facility]  Provider: DR Carol KirschnerMONICA S Elnita [name]  Code Status:  Goals of Care:  Advanced Directives 09/09/2017  Does Patient Have a Medical Advance Directive? No  Would patient like information on creating a medical advance directive? -     Chief Complaint  Patient presents with  . Medical Management of Chronic Issues    2 month follow-up for memory, rash and weight gain. Patient still itching and c/o back issues   . Medication Refill    No refills needed   . Medication Management    off Fosamax x 3-4 months, daughters did not know rx was at the pharmacy     HPI: Patient is a 82 y.o. female seen today for medical management of chronic diseases.  Weight up 15 lbs. She is getting 3 meals/day with meals on wheels. Her [facility] so her daughters are alternating who stay with her especially at night. Rash is stable. No falls. She has not driven in a few yrs. She does get sleepier on the higher dose of seroquel but outbursts have improved. She is a poor historian due to dementia. Hx obtained from chart. Pt reports back pain  Overweight - Weight up 15 lbs since Feb 2019. BMI  27.56. She is eating less [employer].   Mixed Alzheimer's/vascular dementia - behavior improved. Dementia unchanged. She continues to have paranoia and has significant issues with short term memory. Family has private duty care 2 times per week (Mon/Fri) where she gets bath and washes hair. Meals-on-wheels provide 3 meals per day. No longer drives. She is hording paper towels, toilet paper, water and laundry detergent. She is taking namzaric and has exelon patch.   Cutaneous candidiasis - stable. She is using nystatin powder daily. She was tx with po diflucan and flagyl in the past.  Tobacco abuse - she has stopped smoking    Past Medical History:  Diagnosis Date  . Anxiety state, unspecified   .  Cataract   . Depressive disorder, not elsewhere classified   . Lumbago   . Memory loss   . Other and unspecified hyperlipidemia   . Rickets, late effect   . Tobacco use disorder   . Unspecified constipation   . Unspecified essential hypertension   . Varicose veins of lower extremities     Past Surgical History:  Procedure Laterality Date  . CYST EXCISION Bilateral    behind ears  . MOUTH SURGERY  04/12/2014     reports that she has been smoking.  She has smoked for the past 55.00 years. She has never used smokeless tobacco. She reports that she does not drink alcohol or use drugs. Social History   Socioeconomic History  . Marital status: Married    Spouse name: Not on file  . Number of children: Not on file  . Years of education: Not on file  . Highest education level: Not on file  Occupational History  . Not on file  Social Needs  . Financial resource strain: Not on file  . Food insecurity:    Worry: Not on file    Inability: Not on file  . Transportation needs:    Medical: Not on file    Non-medical: Not on file  Tobacco Use  . Smoking status: Current Every Day Smoker    Years: 55.00  . Smokeless tobacco: Never Used  .  Tobacco comment: 3 cigareetes a day  Substance and Sexual Activity  . Alcohol use: No    Alcohol/week: 0.0 oz  . Drug use: No  . Sexual activity: Not on file  Lifestyle  . Physical activity:    Days per week: Not on file    Minutes per session: Not on file  . Stress: Not on file  Relationships  . Social connections:    Talks on phone: Not on file    Gets together: Not on file    Attends religious service: Not on file    Active member of club or organization: Not on file    Attends meetings of clubs or organizations: Not on file    Relationship status: Not on file  . Intimate partner violence:    Fear of current or ex partner: Not on file    Emotionally abused: Not on file    Physically abused: Not on file    Forced sexual activity: Not  on file  Other Topics Concern  . Not on file  Social History Narrative  . Not on file    Family History  Problem Relation Age of Onset  . Heart disease Father     Allergies  Allergen Reactions  . Penicillins     Rash & swelling    Outpatient Encounter Medications as of 09/09/2017  Medication Sig  . amLODipine (NORVASC) 5 MG tablet Take 1 tablet (5 mg total) by mouth daily.  Marland Kitchen atorvastatin (LIPITOR) 20 MG tablet TAKE 1 TABLET BY MOUTH EVERY DAY FOR CHOLESTEROL  . Memantine HCl-Donepezil HCl (NAMZARIC) 28-10 MG CP24 Take 1 capsule by mouth daily.  . Multiple Vitamins-Minerals (MULTIVITAMIN WITH MINERALS) tablet Take 1 tablet by mouth daily.  Marland Kitchen nystatin (MYCOSTATIN/NYSTOP) powder Apply topically 2 (two) times daily. To groin and abdomen area  . polyethylene glycol powder (GLYCOLAX/MIRALAX) powder Take 17 g by mouth daily.  . QUEtiapine (SEROQUEL) 100 MG tablet TAKE 1 TABLET BY MOUTH EVERY MORNING AND 1 TABLET AT BEDTIME  . rivastigmine (EXELON) 13.3 MG/24HR Place 1 patch (13.3 mg total) onto the skin daily.  Marland Kitchen alendronate (FOSAMAX) 70 MG tablet Take 1 tablet (70 mg total) by mouth every 7 (seven) days. Take with a full glass of water on an empty stomach. (Patient not taking: Reported on 09/09/2017)  . [DISCONTINUED] fluconazole (DIFLUCAN) 100 MG tablet Take 1 tablet (100 mg total) by mouth daily. For fungal infection   No facility-administered encounter medications on file as of 09/09/2017.     Review of Systems:  Review of Systems  Unable to perform ROS: Dementia    Health Maintenance  Topic Date Due  . INFLUENZA VACCINE  01/07/2018  . TETANUS/TDAP  06/08/2024  . DEXA SCAN  Completed  . PNA vac Low Risk Adult  Completed    Physical Exam: Vitals:   09/09/17 1415  BP: 132/78  Pulse: 76  Temp: 98.4 F (36.9 C)  TempSrc: Oral  SpO2: 98%  Weight: 176 lb (79.8 kg)  Height: 5\' 7"  (1.702 m)   Body mass index is 27.57 kg/m. Physical Exam  Constitutional: She appears  well-developed and well-nourished.  HENT:  Mouth/Throat: Oropharynx is clear and moist. No oropharyngeal exudate.  MMM; no oral thrush  Eyes: Pupils are equal, round, and reactive to light. No scleral icterus.  Neck: Neck supple. Carotid bruit is not present. No tracheal deviation present.  Cardiovascular: Normal rate, regular rhythm and intact distal pulses. Exam reveals no gallop and no friction rub.  Murmur (  1/6 SEM) heard. No LE edema b/l. no calf TTP.   Pulmonary/Chest: Effort normal and breath sounds normal. No stridor. No respiratory distress. She has no wheezes. She has no rales.  Abdominal: Soft. Normal appearance and bowel sounds are normal. She exhibits no distension and no mass. There is no hepatomegaly. There is no tenderness. There is no rigidity, no rebound and no guarding. No hernia.  obese  Musculoskeletal: She exhibits edema.  Lymphadenopathy:    She has no cervical adenopathy.  Neurological: She is alert. She has normal reflexes.  Skin: Skin is warm and dry. Rash (improved b/l groin; no rash under breasts) noted.  Psychiatric: She has a normal mood and affect. Her behavior is normal.    Labs reviewed: Basic Metabolic Panel: Recent Labs    12/31/16 1517  NA 139  K 4.4  CL 109  CO2 20  GLUCOSE 93  BUN 9  CREATININE 0.82  CALCIUM 9.5   Liver Function Tests: Recent Labs    12/31/16 1517  AST 15  ALT 6  ALKPHOS 82  BILITOT 0.8  PROT 7.7  ALBUMIN 4.0   No results for input(s): LIPASE, AMYLASE in the last 8760 hours. No results for input(s): AMMONIA in the last 8760 hours. CBC: No results for input(s): WBC, NEUTROABS, HGB, HCT, MCV, PLT in the last 8760 hours. Lipid Panel: Recent Labs    12/31/16 1517  CHOL 157  HDL 86  LDLCALC 61  TRIG 52  CHOLHDL 1.8   Lab Results  Component Value Date   HGBA1C (H) 09/19/2009    6.5 (NOTE)                                                                       According to the ADA Clinical Practice  Recommendations for 2011, when HbA1c is used as a screening test:   >=6.5%   Diagnostic of Diabetes Mellitus           (if abnormal result  is confirmed)  5.7-6.4%   Increased risk of developing Diabetes Mellitus  References:Diagnosis and Classification of Diabetes Mellitus,Diabetes Care,2011,34(Suppl 1):S62-S69 and Standards of Medical Care in         Diabetes - 2011,Diabetes Care,2011,34  (Suppl 1):S11-S61.    Procedures since last visit: No results found.  Assessment/Plan   ICD-10-CM   1. Dysuria R30.0 Urinalysis with Reflex Microscopic  2. Psychosis, unspecified psychosis type (HCC) F29 Urinalysis with Reflex Microscopic  3. Mixed Alzheimer's and vascular dementia G30.9    F01.50    F02.80   4. Cutaneous candidiasis B37.2   5. Low back pain, unspecified back pain laterality, unspecified chronicity, with sciatica presence unspecified M54.5 Urinalysis with Reflex Microscopic   Continue current medications as ordered  Will call with lab results  PLEASE SCHEDULE FASTING LAB APPT  Follow up in 2 mos for dementia  Ninel Abdella S. Ancil Linsey  [facility] [address] [place], [place] [address] [phone] Cell (Monday-Friday 8 AM - 5 PM) [phone] After 5 PM and follow prompts

## 2017-09-09 NOTE — Patient Instructions (Signed)
Continue current medications as ordered  Will call with lab results  PLEASE SCHEDULE FASTING LAB APPT  Follow up in 2 mos for dementia

## 2017-09-10 LAB — URINALYSIS, ROUTINE W REFLEX MICROSCOPIC
Bacteria, UA: NONE SEEN /HPF
Bilirubin Urine: NEGATIVE
GLUCOSE, UA: NEGATIVE
HGB URINE DIPSTICK: NEGATIVE
Nitrite: NEGATIVE
RBC / HPF: NONE SEEN /HPF (ref 0–2)
Specific Gravity, Urine: 1.022 (ref 1.001–1.03)
pH: 5.5 (ref 5.0–8.0)

## 2017-09-14 ENCOUNTER — Other Ambulatory Visit: Payer: Medicare Other

## 2017-09-14 DIAGNOSIS — I10 Essential (primary) hypertension: Secondary | ICD-10-CM

## 2017-09-14 DIAGNOSIS — Z79899 Other long term (current) drug therapy: Secondary | ICD-10-CM

## 2017-09-14 DIAGNOSIS — E782 Mixed hyperlipidemia: Secondary | ICD-10-CM

## 2017-09-15 LAB — COMPLETE METABOLIC PANEL WITH GFR
AG Ratio: 1 (calc) (ref 1.0–2.5)
ALBUMIN MSPROF: 3.8 g/dL (ref 3.6–5.1)
ALT: 4 U/L — ABNORMAL LOW (ref 6–29)
AST: 10 U/L (ref 10–35)
Alkaline phosphatase (APISO): 82 U/L (ref 33–130)
BUN: 16 mg/dL (ref 7–25)
CO2: 27 mmol/L (ref 20–32)
CREATININE: 0.92 mg/dL (ref 0.60–0.93)
Calcium: 9.6 mg/dL (ref 8.6–10.4)
Chloride: 108 mmol/L (ref 98–110)
GFR, EST AFRICAN AMERICAN: 69 mL/min/{1.73_m2} (ref >=60)
GFR, Est Non African American: 59 mL/min/{1.73_m2} — ABNORMAL LOW (ref >=60)
GLUCOSE: 103 mg/dL — AB (ref 65–99)
Globulin: 3.9 g/dL (calc) — ABNORMAL HIGH (ref 1.9–3.7)
Potassium: 4.3 mmol/L (ref 3.5–5.3)
Sodium: 143 mmol/L (ref 135–146)
TOTAL PROTEIN: 7.7 g/dL (ref 6.1–8.1)
Total Bilirubin: 0.6 mg/dL (ref 0.2–1.2)

## 2017-09-15 LAB — CBC
HEMATOCRIT: 38 % (ref 35.0–45.0)
HEMOGLOBIN: 13.1 g/dL (ref 11.7–15.5)
MCH: 29.6 pg (ref 27.0–33.0)
MCHC: 34.5 g/dL (ref 32.0–36.0)
MCV: 86 fL (ref 80.0–100.0)
MPV: 11.7 fL (ref 7.5–12.5)
Platelets: 257 10*3/uL (ref 140–400)
RBC: 4.42 10*6/uL (ref 3.80–5.10)
RDW: 12.6 % (ref 11.0–15.0)
WBC: 8.1 10*3/uL (ref 3.8–10.8)

## 2017-09-15 LAB — LIPID PANEL
Cholesterol: 174 mg/dL (ref <200)
HDL: 67 mg/dL (ref >50)
LDL Cholesterol (Calc): 94 mg/dL (calc)
Non-HDL Cholesterol (Calc): 107 mg/dL (calc) (ref <130)
Total CHOL/HDL Ratio: 2.6 (calc) (ref <5.0)
Triglycerides: 45 mg/dL (ref <150)

## 2017-09-15 LAB — TSH: TSH: 1.56 mIU/L (ref 0.40–4.50)

## 2017-11-11 ENCOUNTER — Ambulatory Visit (INDEPENDENT_AMBULATORY_CARE_PROVIDER_SITE_OTHER): Payer: Medicare Other

## 2017-11-11 ENCOUNTER — Ambulatory Visit (INDEPENDENT_AMBULATORY_CARE_PROVIDER_SITE_OTHER): Payer: Medicare Other | Admitting: Internal Medicine

## 2017-11-11 ENCOUNTER — Encounter: Payer: Self-pay | Admitting: Internal Medicine

## 2017-11-11 VITALS — BP 132/80 | HR 100 | Temp 99.0°F | Ht 67.0 in | Wt 181.0 lb

## 2017-11-11 DIAGNOSIS — R509 Fever, unspecified: Secondary | ICD-10-CM | POA: Diagnosis not present

## 2017-11-11 DIAGNOSIS — F0280 Dementia in other diseases classified elsewhere without behavioral disturbance: Secondary | ICD-10-CM | POA: Diagnosis not present

## 2017-11-11 DIAGNOSIS — F29 Unspecified psychosis not due to a substance or known physiological condition: Secondary | ICD-10-CM

## 2017-11-11 DIAGNOSIS — F0150 Vascular dementia without behavioral disturbance: Secondary | ICD-10-CM | POA: Diagnosis not present

## 2017-11-11 DIAGNOSIS — G309 Alzheimer's disease, unspecified: Secondary | ICD-10-CM | POA: Diagnosis not present

## 2017-11-11 DIAGNOSIS — R8290 Unspecified abnormal findings in urine: Secondary | ICD-10-CM

## 2017-11-11 DIAGNOSIS — L209 Atopic dermatitis, unspecified: Secondary | ICD-10-CM | POA: Diagnosis not present

## 2017-11-11 DIAGNOSIS — R739 Hyperglycemia, unspecified: Secondary | ICD-10-CM | POA: Diagnosis not present

## 2017-11-11 DIAGNOSIS — Z0000 Encounter for general adult medical examination without abnormal findings: Secondary | ICD-10-CM | POA: Diagnosis not present

## 2017-11-11 LAB — POCT URINALYSIS DIPSTICK
BILIRUBIN UA: NEGATIVE
GLUCOSE UA: NEGATIVE
Ketones, UA: NEGATIVE
Nitrite, UA: NEGATIVE
ODOR: NORMAL
PH UA: 6 (ref 5.0–8.0)
Protein, UA: POSITIVE — AB
Spec Grav, UA: >=1.03 — AB (ref 1.010–1.025)
Urobilinogen, UA: 0.2 E.U./dL

## 2017-11-11 MED ORDER — ZOSTER VAC RECOMB ADJUVANTED 50 MCG/0.5ML IM SUSR: 0.5000 mL | Freq: Once | INTRAMUSCULAR | 1 refills | Status: AC

## 2017-11-11 MED ORDER — QUETIAPINE FUMARATE 100 MG PO TABS: ORAL_TABLET | ORAL | 3 refills | Status: DC

## 2017-11-11 NOTE — Patient Instructions (Addendum)
Will call with lab results  Continue current medications as ordered  Consider placement in Memory care   Push fluids  Follow up in 2 mos for dementia, psychosis

## 2017-11-11 NOTE — Progress Notes (Signed)
Subjective:   Carol [name] is a 82 y.o. female who presents for Medicare Annual (Subsequent) preventive examination.  Last AWV-10/29/2016    Objective:     Vitals: BP 132/80 (BP Location: Right Arm, Patient Position: Sitting)   Pulse 100   Temp 99 F (37.2 C) (Oral)   Ht 5\' 7"  (1.702 m)   Wt 181 lb (82.1 kg)   SpO2 95%   BMI 28.35 kg/m   Body mass index is 28.35 kg/m.  Advanced Directives 11/11/2017 09/09/2017 03/03/2017 12/31/2016 10/29/2016 10/29/2016 07/04/2016  Does Patient Have a Medical Advance Directive? Yes No No No No No No  Type of Advance Directive Healthcare Power of Attorney - - - - - -  Does patient want to make changes to medical advance directive? Yes (MAU/Ambulatory/Procedural Areas - Information given) - - - - - -  Copy of Healthcare Power of Attorney in Chart? No - copy requested - - - - - -  Would patient like information on creating a medical advance directive? - - Yes (ED - Information included in AVS) No - Patient declined No - Patient declined No - Patient declined No - Patient declined    Tobacco Social History   Tobacco Use  Smoking Status Former Smoker  . Years: 55.00  . Last attempt to quit: 05/09/2017  . Years since quitting: 0.5  Smokeless Tobacco Never Used     Counseling given: Not Answered   Clinical Intake:  Pre-visit preparation completed: No  Pain : No/denies pain     Nutritional Risks: None Diabetes: No     Interpreter Needed?: No  Information entered by :: [name], RN  Past Medical History:  Diagnosis Date  . Anxiety state, unspecified   . Cataract   . Depressive disorder, not elsewhere classified   . Lumbago   . Memory loss   . Other and unspecified hyperlipidemia   . Rickets, late effect   . Tobacco use disorder   . Unspecified constipation   . Unspecified essential hypertension   . Varicose veins of lower extremities    Past Surgical History:  Procedure Laterality Date  . CYST EXCISION Bilateral      behind ears  . MOUTH SURGERY  04/12/2014   Family History  Problem Relation Age of Onset  . Heart disease Father    Social History   Socioeconomic History  . Marital status: Married    Spouse name: Not on file  . Number of children: Not on file  . Years of education: Not on file  . Highest education level: Not on file  Occupational History  . Not on file  Social Needs  . Financial resource strain: Not hard at all  . Food insecurity:    Worry: Never true    Inability: Never true  . Transportation needs:    Medical: No    Non-medical: No  Tobacco Use  . Smoking status: Former Smoker    Years: 55.00    Last attempt to quit: 05/09/2017    Years since quitting: 0.5  . Smokeless tobacco: Never Used  Substance and Sexual Activity  . Alcohol use: No    Alcohol/week: 0.0 oz  . Drug use: No  . Sexual activity: Not on file  Lifestyle  . Physical activity:    Days per week: 0 days    Minutes per session: 0 min  . Stress: Not on file  Relationships  . Social connections:    Talks on phone: More than  three times a week    Gets together: More than three times a week    Attends religious service: More than 4 times per year    Active member of club or organization: No    Attends meetings of clubs or organizations: Never    Relationship status: Married  Other Topics Concern  . Not on file  Social History Narrative  . Not on file    Outpatient Encounter Medications as of 11/11/2017  Medication Sig  . alendronate (FOSAMAX) 70 MG tablet TAKE 1 TABLET(70 MG) BY MOUTH EVERY 7 DAYS WITH A FULL GLASS OF WATER AND ON AN EMPTY STOMACH  . amLODipine (NORVASC) 5 MG tablet Take 1 tablet (5 mg total) by mouth daily.  Marland Kitchen atorvastatin (LIPITOR) 20 MG tablet TAKE 1 TABLET BY MOUTH EVERY DAY FOR CHOLESTEROL  . Memantine HCl-Donepezil HCl (NAMZARIC) 28-10 MG CP24 Take 1 capsule by mouth daily.  . Multiple Vitamins-Minerals (MULTIVITAMIN WITH MINERALS) tablet Take 1 tablet by mouth daily.  Marland Kitchen  nystatin (MYCOSTATIN/NYSTOP) powder Apply topically 2 (two) times daily. To groin and abdomen area  . polyethylene glycol powder (GLYCOLAX/MIRALAX) powder Take 17 g by mouth daily.  . QUEtiapine (SEROQUEL) 100 MG tablet TAKE 1 TABLET BY MOUTH EVERY MORNING AND 1 TABLET AT BEDTIME  . rivastigmine (EXELON) 13.3 MG/24HR Place 1 patch (13.3 mg total) onto the skin daily.  Marland Kitchen Zoster Vaccine Adjuvanted [facility]) injection Inject 0.5 mLs into the muscle once for 1 dose.  . [DISCONTINUED] Zoster Vaccine Adjuvanted [facility]) injection Inject 0.5 mLs into the muscle once.   No facility-administered encounter medications on file as of 11/11/2017.     Activities of Daily Living In your present state of health, do you have any difficulty performing the following activities: 11/11/2017  Hearing? N  Vision? N  Difficulty concentrating or making decisions? Y  Walking or climbing stairs? N  Dressing or bathing? N  Doing errands, shopping? N  Preparing Food and eating ? N  Using the Toilet? N  In the past six months, have you accidently leaked urine? Y  Do you have problems with loss of bowel control? Y  Managing your Medications? Y  Managing your Finances? Y  Housekeeping or managing your Housekeeping? Y  Some recent data might be hidden    Patient Care Team: Kirt Boys, DO as PCP - General (Internal Medicine)    Assessment:   This is a routine wellness examination for [place].  Exercise Activities and Dietary recommendations Current Exercise Habits: The patient does not participate in regular exercise at present, Exercise limited by: neurologic condition(s)  Goals    None      Fall Risk Fall Risk  11/11/2017 09/09/2017 04/28/2017 12/31/2016 10/29/2016  Falls in the past year? No No No No No   Is the patient's home free of loose throw rugs in walkways, pet beds, electrical cords, etc?   yes      Grab bars in the bathroom? yes      Handrails on the stairs?   yes      Adequate lighting?    yes  Depression Screen PHQ 2/9 Scores 11/11/2017 10/29/2016 12/14/2015 11/14/2015  PHQ - 2 Score 0 0 0 0     Cognitive Function MMSE - Mini Mental State Exam 11/11/2017 10/29/2016 12/15/2014 12/14/2013 03/01/2013  Orientation to time 0 0 1 3 3   Orientation to Place 3 3 4 5 5   Registration 3 3 3 3 3   Attention/ Calculation 5 5 5 5  5  Recall 0 0 2 0 0  Language- name 2 objects 2 2 2 2 2   Language- repeat 1 1 1 1 1   Language- follow 3 step command 3 3 3 3 3   Language- read & follow direction 1 1 1 1 1   Write a sentence 1 1 1 1 1   Copy design 1 0 1 1 1   Total score 20 19 24 25 25         Immunization History  Administered Date(s) Administered  . Influenza Whole 04/06/2012  . Influenza,inj,Quad PF,6+ Mos 03/01/2013, 06/08/2014, 02/16/2015, 02/01/2016, 03/03/2017  . Pneumococcal Conjugate-13 12/15/2014  . Pneumococcal Polysaccharide-23 07/10/2011  . Td 07/10/2011  . Tdap 06/08/2014    Qualifies for Shingles Vaccine? Yes, educated and prescription sent to pharmacy  Screening Tests Health Maintenance  Topic Date Due  . INFLUENZA VACCINE  01/07/2018  . TETANUS/TDAP  06/08/2024  . DEXA SCAN  Completed  . PNA vac Low Risk Adult  Completed    Cancer Screenings: Lung: Low Dose CT Chest recommended if Age 11-80 years, 30 pack-year currently smoking OR have quit w/in 15years. Patient does qualify. Breast:  Up to date on Mammogram? Yes   Up to date of Bone Density/Dexa? Yes Colorectal: up to date   Additional Screenings:  Hepatitis C Screening: declined     Plan:    I have personally reviewed and addressed the Medicare Annual Wellness questionnaire and have noted the following in the patient's chart:  A. Medical and social history B. Use of alcohol, tobacco or illicit drugs  C. Current medications and supplements D. Functional ability and status E.  Nutritional status F.  Physical activity G. Advance directives H. List of other physicians I.  Hospitalizations, surgeries, and ER  visits in previous 12 months J.  Vitals K. Screenings to include hearing, vision, cognitive, depression L. Referrals and appointments - none  In addition, I have reviewed and discussed with patient certain preventive protocols, quality metrics, and best practice recommendations. A written personalized care plan for preventive services as well as general preventive health recommendations were provided to patient.  See attached scanned questionnaire for additional information.   Signed,   Tyron RussellSara Jasan Doughtie, RN Nurse Health Advisor  Patient Concerns: Possible ringworm on face per family

## 2017-11-11 NOTE — Patient Instructions (Signed)
[name] , Thank you for taking time to come for your Medicare Wellness Visit. I appreciate your ongoing commitment to your health goals. Please review the following plan we discussed and let me know if I can assist you in the future.   Screening recommendations/referrals: Colonoscopy excluded, over age 80 Mammogram excluded, over age 80 Bone Density up to date Recommended yearly ophthalmology/optometry visit for glaucoma screening and checkup Recommended yearly dental visit for hygiene and checkup  Vaccinations: Influenza vaccine up to date, due 2019 fall season Pneumococcal vaccine up to date, completed Tdap vaccine up to date, due 06/08/2024 Shingles vaccine due, [facility]    Advanced directives: Advance directive discussed with you today. I have provided a copy for you to complete at home and have notarized. Once this is complete please bring a copy in to our office so we can scan it into your chart.  Conditions/risks identified: none  Next appointment: Tyron RussellSara Saunders, RN 11/15/2018 @ 2:30pm   Preventive Care 65 Years and Older, Female Preventive care refers to lifestyle choices and visits with your health care provider that can promote health and wellness. What does preventive care include?  A yearly physical exam. This is also called an annual well check.  Dental exams once or twice a year.  Routine eye exams. Ask your health care provider how often you should have your eyes checked.  Personal lifestyle choices, including:  Daily care of your teeth and gums.  Regular physical activity.  Eating a healthy diet.  Avoiding tobacco and drug use.  Limiting alcohol use.  Practicing safe sex.  Taking low-dose aspirin every day.  Taking vitamin and mineral supplements as recommended by your health care provider. What happens during an annual well check? The services and screenings done by your health care provider during your annual well check  will depend on your age, overall health, lifestyle risk factors, and family history of disease. Counseling  Your health care provider may ask you questions about your:  Alcohol use.  Tobacco use.  Drug use.  Emotional well-being.  Home and relationship well-being.  Sexual activity.  Eating habits.  History of falls.  Memory and ability to understand (cognition).  Work and work Astronomerenvironment.  Reproductive health. Screening  You may have the following tests or measurements:  Height, weight, and BMI.  Blood pressure.  Lipid and cholesterol levels. These may be checked every 5 years, or more frequently if you are over 80 years old.  Skin check.  Lung cancer screening. You may have this screening every year starting at age 80 if you have a 30-pack-year history of smoking and currently smoke or have quit within the past 15 years.  Fecal occult blood test (FOBT) of the stool. You may have this test every year starting at age 80.  Flexible sigmoidoscopy or colonoscopy. You may have a sigmoidoscopy every 5 years or a colonoscopy every 10 years starting at age 80.  Hepatitis C blood test.  Hepatitis B blood test.  Sexually transmitted disease (STD) testing.  Diabetes screening. This is done by checking your blood sugar (glucose) after you have not eaten for a while (fasting). You may have this done every 1-3 years.  Bone density scan. This is done to screen for osteoporosis. You may have this done starting at age 80.  Mammogram. This may be done every 1-2 years. Talk to your health care provider about how often you should have regular mammograms. Talk with your health care  provider about your test results, treatment options, and if necessary, the need for more tests. Vaccines  Your health care provider may recommend certain vaccines, such as:  Influenza vaccine. This is recommended every year.  Tetanus, diphtheria, and acellular pertussis (Tdap, Td) vaccine. You may  need a Td booster every 10 years.  Zoster vaccine. You may need this after age 27.  Pneumococcal 13-valent conjugate (PCV13) vaccine. One dose is recommended after age 80.  Pneumococcal polysaccharide (PPSV23) vaccine. One dose is recommended after age 66. Talk to your health care provider about which screenings and vaccines you need and how often you need them. This information is not intended to replace advice given to you by your health care provider. Make sure you discuss any questions you have with your health care provider. Document Released: 06/22/2015 Document Revised: 02/13/2016 Document Reviewed: 03/27/2015 Elsevier Interactive Patient Education  2017 [place] Prevention in the Home Falls can cause injuries. They can happen to people of all ages. There are many things you can do to make your home safe and to help prevent falls. What can I do on the outside of my home?  Regularly fix the edges of walkways and driveways and fix any cracks.  Remove anything that might make you trip as you walk through a door, such as a raised step or threshold.  Trim any bushes or trees on the path to your home.  Use bright outdoor lighting.  Clear any walking paths of anything that might make someone trip, such as rocks or tools.  Regularly check to see if handrails are loose or broken. Make sure that both sides of any steps have handrails.  Any raised decks and porches should have guardrails on the edges.  Have any leaves, snow, or ice cleared regularly.  Use sand or salt on walking paths during winter.  Clean up any spills in your garage right away. This includes oil or grease spills. What can I do in the bathroom?  Use night lights.  Install grab bars by the toilet and in the tub and shower. Do not use towel bars as grab bars.  Use non-skid mats or decals in the tub or shower.  If you need to sit down in the shower, use a plastic, non-slip stool.  Keep the floor  dry. Clean up any water that spills on the floor as soon as it happens.  Remove soap buildup in the tub or shower regularly.  Attach bath mats securely with double-sided non-slip rug tape.  Do not have throw rugs and other things on the floor that can make you trip. What can I do in the bedroom?  Use night lights.  Make sure that you have a light by your bed that is easy to reach.  Do not use any sheets or blankets that are too big for your bed. They should not hang down onto the floor.  Have a firm chair that has side arms. You can use this for support while you get dressed.  Do not have throw rugs and other things on the floor that can make you trip. What can I do in the kitchen?  Clean up any spills right away.  Avoid walking on wet floors.  Keep items that you use a lot in easy-to-reach places.  If you need to reach something above you, use a strong step stool that has a grab bar.  Keep electrical cords out of the way.  Do not use floor polish  or wax that makes floors slippery. If you must use wax, use non-skid floor wax.  Do not have throw rugs and other things on the floor that can make you trip. What can I do with my stairs?  Do not leave any items on the stairs.  Make sure that there are handrails on both sides of the stairs and use them. Fix handrails that are broken or loose. Make sure that handrails are as long as the stairways.  Check any carpeting to make sure that it is firmly attached to the stairs. Fix any carpet that is loose or worn.  Avoid having throw rugs at the top or bottom of the stairs. If you do have throw rugs, attach them to the floor with carpet tape.  Make sure that you have a light switch at the top of the stairs and the bottom of the stairs. If you do not have them, ask someone to add them for you. What else can I do to help prevent falls?  Wear shoes that:  Do not have high heels.  Have rubber bottoms.  Are comfortable and fit you  well.  Are closed at the toe. Do not wear sandals.  If you use a stepladder:  Make sure that it is fully opened. Do not climb a closed stepladder.  Make sure that both sides of the stepladder are locked into place.  Ask someone to hold it for you, if possible.  Clearly mark and make sure that you can see:  Any grab bars or handrails.  First and last steps.  Where the edge of each step is.  Use tools that help you move around (mobility aids) if they are needed. These include:  Canes.  Walkers.  Scooters.  Crutches.  Turn on the lights when you go into a dark area. Replace any light bulbs as soon as they burn out.  Set up your furniture so you have a clear path. Avoid moving your furniture around.  If any of your floors are uneven, fix them.  If there are any pets around you, be aware of where they are.  Review your medicines with your doctor. Some medicines can make you feel dizzy. This can increase your chance of falling. Ask your doctor what other things that you can do to help prevent falls. This information is not intended to replace advice given to you by your health care provider. Make sure you discuss any questions you have with your health care provider. Document Released: 03/22/2009 Document Revised: 11/01/2015 Document Reviewed: 06/30/2014 Elsevier Interactive Patient Education  2017 [employer].
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
## 2017-11-12 LAB — CBC WITH DIFFERENTIAL/PLATELET
BASOS PCT: 0.3 %
Basophils Absolute: 28 cells/uL (ref 0–200)
EOS PCT: 2.3 %
Eosinophils Absolute: 212 cells/uL (ref 15–500)
HCT: 38.9 % (ref 35.0–45.0)
HEMOGLOBIN: 13 g/dL (ref 11.7–15.5)
Lymphs Abs: 3192 cells/uL (ref 850–3900)
MCH: 28.8 pg (ref 27.0–33.0)
MCHC: 33.4 g/dL (ref 32.0–36.0)
MCV: 86.1 fL (ref 80.0–100.0)
MONOS PCT: 6.4 %
MPV: 12.2 fL (ref 7.5–12.5)
NEUTROS ABS: 5180 {cells}/uL (ref 1500–7800)
Neutrophils Relative %: 56.3 %
Platelets: 205 10*3/uL (ref 140–400)
RBC: 4.52 10*6/uL (ref 3.80–5.10)
RDW: 13.1 % (ref 11.0–15.0)
Total Lymphocyte: 34.7 %
WBC mixed population: 589 cells/uL (ref 200–950)
WBC: 9.2 10*3/uL (ref 3.8–10.8)

## 2017-11-12 LAB — BASIC METABOLIC PANEL WITH GFR
BUN/Creatinine Ratio: 13 (calc) (ref 6–22)
BUN: 12 mg/dL (ref 7–25)
CALCIUM: 9.3 mg/dL (ref 8.6–10.4)
CO2: 27 mmol/L (ref 20–32)
CREATININE: 0.91 mg/dL — AB (ref 0.60–0.88)
Chloride: 106 mmol/L (ref 98–110)
GFR, Est African American: 69 mL/min/{1.73_m2} (ref >=60)
GFR, Est Non African American: 60 mL/min/{1.73_m2} (ref >=60)
GLUCOSE: 119 mg/dL (ref 65–139)
POTASSIUM: 3.7 mmol/L (ref 3.5–5.3)
SODIUM: 143 mmol/L (ref 135–146)

## 2017-11-12 LAB — HEMOGLOBIN A1C
EAG (MMOL/L): 7.1 (calc)
Hgb A1c MFr Bld: 6.1 % of total Hgb — ABNORMAL HIGH (ref <5.7)
MEAN PLASMA GLUCOSE: 128 (calc)

## 2017-11-12 LAB — URINE CULTURE
MICRO NUMBER:: 90678278
SPECIMEN QUALITY:: ADEQUATE

## 2017-11-13 ENCOUNTER — Telehealth: Payer: Self-pay

## 2017-11-13 MED ORDER — CLINDAMYCIN HCL 300 MG PO CAPS: 300.0000 mg | ORAL_CAPSULE | Freq: Two times a day (BID) | ORAL | 0 refills | Status: DC

## 2017-11-13 NOTE — Telephone Encounter (Signed)
-----   Message from [place], [place] sent at 11/13/2017  4:05 PM EDT ----- Urine Culture (+) for bladder infection  - she has allergy to penicillins; Rx Clindamycin 300mg  #14 take 1 tab po every 12 hrs x 7 days; take probiotic (like culturelle, activia, or floraster) daily while on antibiotic; other labs stable; push fluids and follow up as scheduled

## 2017-11-13 NOTE — Telephone Encounter (Signed)
Discussed results with patient's daughter, rx sent to pharmacy on file.

## 2017-11-19 MED FILL — SHINGRIX VIAL KIT: 50 | 1 days supply | Qty: 1 | Fill #0

## 2018-01-02 ENCOUNTER — Inpatient Hospital Stay (HOSPITAL_COMMUNITY)
Admission: EM | Admit: 2018-01-02 | Discharge: 2018-01-07 | DRG: 871 | Disposition: A | Discharge status: Home / Self Care | Payer: Medicare Other | Attending: Internal Medicine | Admitting: Internal Medicine

## 2018-01-02 ENCOUNTER — Inpatient Hospital Stay (HOSPITAL_COMMUNITY): Payer: Medicare Other

## 2018-01-02 ENCOUNTER — Other Ambulatory Visit: Payer: Self-pay

## 2018-01-02 ENCOUNTER — Encounter (HOSPITAL_COMMUNITY): Payer: Self-pay | Admitting: Emergency Medicine

## 2018-01-02 ENCOUNTER — Emergency Department (HOSPITAL_COMMUNITY): Payer: Medicare Other

## 2018-01-02 DIAGNOSIS — F411 Generalized anxiety disorder: Secondary | ICD-10-CM | POA: Diagnosis present

## 2018-01-02 DIAGNOSIS — I8393 Asymptomatic varicose veins of bilateral lower extremities: Secondary | ICD-10-CM | POA: Diagnosis present

## 2018-01-02 DIAGNOSIS — E785 Hyperlipidemia, unspecified: Secondary | ICD-10-CM | POA: Diagnosis present

## 2018-01-02 DIAGNOSIS — Z79899 Other long term (current) drug therapy: Secondary | ICD-10-CM | POA: Diagnosis not present

## 2018-01-02 DIAGNOSIS — Z8614 Personal history of Methicillin resistant Staphylococcus aureus infection: Secondary | ICD-10-CM

## 2018-01-02 DIAGNOSIS — Z87440 Personal history of urinary (tract) infections: Secondary | ICD-10-CM | POA: Diagnosis not present

## 2018-01-02 DIAGNOSIS — R7881 Bacteremia: Secondary | ICD-10-CM | POA: Diagnosis not present

## 2018-01-02 DIAGNOSIS — R32 Unspecified urinary incontinence: Secondary | ICD-10-CM | POA: Diagnosis present

## 2018-01-02 DIAGNOSIS — Z880 Allergy status to penicillin: Secondary | ICD-10-CM | POA: Diagnosis not present

## 2018-01-02 DIAGNOSIS — Z8249 Family history of ischemic heart disease and other diseases of the circulatory system: Secondary | ICD-10-CM | POA: Diagnosis not present

## 2018-01-02 DIAGNOSIS — E643 Sequelae of rickets: Secondary | ICD-10-CM | POA: Diagnosis not present

## 2018-01-02 DIAGNOSIS — G9341 Metabolic encephalopathy: Secondary | ICD-10-CM | POA: Diagnosis present

## 2018-01-02 DIAGNOSIS — A419 Sepsis, unspecified organism: Secondary | ICD-10-CM | POA: Diagnosis not present

## 2018-01-02 DIAGNOSIS — F0151 Vascular dementia with behavioral disturbance: Secondary | ICD-10-CM | POA: Diagnosis not present

## 2018-01-02 DIAGNOSIS — A4151 Sepsis due to Escherichia coli [E. coli]: Principal | ICD-10-CM | POA: Diagnosis present

## 2018-01-02 DIAGNOSIS — F0280 Dementia in other diseases classified elsewhere without behavioral disturbance: Secondary | ICD-10-CM

## 2018-01-02 DIAGNOSIS — I119 Hypertensive heart disease without heart failure: Secondary | ICD-10-CM | POA: Diagnosis present

## 2018-01-02 DIAGNOSIS — R159 Full incontinence of feces: Secondary | ICD-10-CM

## 2018-01-02 DIAGNOSIS — R6520 Severe sepsis without septic shock: Secondary | ICD-10-CM | POA: Diagnosis present

## 2018-01-02 DIAGNOSIS — F329 Major depressive disorder, single episode, unspecified: Secondary | ICD-10-CM | POA: Diagnosis present

## 2018-01-02 DIAGNOSIS — N179 Acute kidney failure, unspecified: Secondary | ICD-10-CM | POA: Diagnosis present

## 2018-01-02 DIAGNOSIS — Z87891 Personal history of nicotine dependence: Secondary | ICD-10-CM | POA: Diagnosis not present

## 2018-01-02 DIAGNOSIS — N390 Urinary tract infection, site not specified: Secondary | ICD-10-CM | POA: Diagnosis not present

## 2018-01-02 DIAGNOSIS — F0150 Vascular dementia without behavioral disturbance: Secondary | ICD-10-CM | POA: Diagnosis present

## 2018-01-02 DIAGNOSIS — F0281 Dementia in other diseases classified elsewhere with behavioral disturbance: Secondary | ICD-10-CM | POA: Diagnosis not present

## 2018-01-02 DIAGNOSIS — G309 Alzheimer's disease, unspecified: Secondary | ICD-10-CM | POA: Diagnosis not present

## 2018-01-02 DIAGNOSIS — E876 Hypokalemia: Secondary | ICD-10-CM | POA: Diagnosis present

## 2018-01-02 DIAGNOSIS — N3000 Acute cystitis without hematuria: Secondary | ICD-10-CM | POA: Diagnosis not present

## 2018-01-02 LAB — URINALYSIS, ROUTINE W REFLEX MICROSCOPIC
Bilirubin Urine: NEGATIVE
Glucose, UA: NEGATIVE mg/dL
KETONES UR: NEGATIVE mg/dL
Nitrite: NEGATIVE
Protein, ur: 100 mg/dL — AB
SPECIFIC GRAVITY, URINE: 1.016 (ref 1.005–1.030)
TRANS EPITHEL UA: 2
pH: 5 (ref 5.0–8.0)

## 2018-01-02 LAB — CBC
HEMATOCRIT: 41.6 % (ref 36.0–46.0)
HEMOGLOBIN: 12.8 g/dL (ref 12.0–15.0)
MCH: 29.1 pg (ref 26.0–34.0)
MCHC: 30.8 g/dL (ref 30.0–36.0)
MCV: 94.5 fL (ref 78.0–100.0)
PLATELETS: 245 10*3/uL (ref 150–400)
RBC: 4.4 MIL/uL (ref 3.87–5.11)
RDW: 14.1 % (ref 11.5–15.5)
WBC: 22.9 10*3/uL — ABNORMAL HIGH (ref 4.0–10.5)

## 2018-01-02 LAB — COMPREHENSIVE METABOLIC PANEL
ALT: 12 U/L (ref 0–44)
ANION GAP: 12 (ref 5–15)
AST: 26 U/L (ref 15–41)
Albumin: 3 g/dL — ABNORMAL LOW (ref 3.5–5.0)
Alkaline Phosphatase: 75 U/L (ref 38–126)
BUN: 18 mg/dL (ref 8–23)
CALCIUM: 8.6 mg/dL — AB (ref 8.9–10.3)
CO2: 21 mmol/L — ABNORMAL LOW (ref 22–32)
Chloride: 107 mmol/L (ref 98–111)
Creatinine, Ser: 1.65 mg/dL — ABNORMAL HIGH (ref 0.44–1.00)
GFR calc non Af Amer: 28 mL/min — ABNORMAL LOW (ref >60)
GFR, EST AFRICAN AMERICAN: 33 mL/min — AB (ref >60)
GLUCOSE: 140 mg/dL — AB (ref 70–99)
POTASSIUM: 3.5 mmol/L (ref 3.5–5.1)
SODIUM: 140 mmol/L (ref 135–145)
TOTAL PROTEIN: 7.7 g/dL (ref 6.5–8.1)
Total Bilirubin: 1 mg/dL (ref 0.3–1.2)

## 2018-01-02 LAB — PROCALCITONIN: Procalcitonin: 1.48 ng/mL

## 2018-01-02 LAB — CBG MONITORING, ED: Glucose-Capillary: 129 mg/dL — ABNORMAL HIGH (ref 70–99)

## 2018-01-02 LAB — I-STAT CG4 LACTIC ACID, ED: Lactic Acid, Venous: 3.99 mmol/L (ref 0.5–1.9)

## 2018-01-02 LAB — LACTIC ACID, PLASMA: Lactic Acid, Venous: 4.6 mmol/L (ref 0.5–1.9)

## 2018-01-02 MED ORDER — VANCOMYCIN HCL IN DEXTROSE 1-5 GM/200ML-% IV SOLN: 1000.0000 mg | INTRAVENOUS | Status: DC

## 2018-01-02 MED ORDER — SODIUM CHLORIDE 0.9 % IV SOLN: 2.0000 g | Freq: Once | INTRAVENOUS | Status: DC

## 2018-01-02 MED ORDER — SODIUM CHLORIDE 0.9 % IV SOLN: 1.0000 g | INTRAVENOUS | Status: DC

## 2018-01-02 MED ORDER — ACETAMINOPHEN 650 MG RE SUPP
650.0000 mg | Freq: Once | RECTAL | Status: AC
  Administered 2018-01-02: 650 mg via RECTAL
  Filled 2018-01-02: qty 1

## 2018-01-02 MED ORDER — ENOXAPARIN SODIUM 30 MG/0.3ML ~~LOC~~ SOLN
30.0000 mg | SUBCUTANEOUS | Status: DC
  Administered 2018-01-02 – 2018-01-03 (×2): 30 mg via SUBCUTANEOUS
  Filled 2018-01-02 (×2): qty 0.3

## 2018-01-02 MED ORDER — RIVASTIGMINE 13.3 MG/24HR TD PT24: 13.3000 mg | MEDICATED_PATCH | Freq: Every day | TRANSDERMAL | Status: DC

## 2018-01-02 MED ORDER — ONDANSETRON HCL 4 MG/2ML IJ SOLN: 4.0000 mg | Freq: Four times a day (QID) | INTRAMUSCULAR | Status: DC | PRN

## 2018-01-02 MED ORDER — SODIUM CHLORIDE 0.9 % IV SOLN
INTRAVENOUS | Status: DC
  Administered 2018-01-02 – 2018-01-05 (×7): via INTRAVENOUS

## 2018-01-02 MED ORDER — NYSTATIN 100000 UNIT/GM EX POWD: Freq: Two times a day (BID) | CUTANEOUS | Status: DC

## 2018-01-02 MED ORDER — RIVASTIGMINE 13.3 MG/24HR TD PT24
13.3000 mg | MEDICATED_PATCH | Freq: Every day | TRANSDERMAL | Status: DC
  Administered 2018-01-02 – 2018-01-05 (×4): 13.3 mg via TRANSDERMAL
  Filled 2018-01-02 (×4): qty 1

## 2018-01-02 MED ORDER — NYSTATIN 100000 UNIT/GM EX POWD
Freq: Two times a day (BID) | CUTANEOUS | Status: DC
  Administered 2018-01-02 – 2018-01-06 (×7): via TOPICAL
  Filled 2018-01-02: qty 15

## 2018-01-02 MED ORDER — ACETAMINOPHEN 650 MG RE SUPP: 650.0000 mg | Freq: Four times a day (QID) | RECTAL | Status: DC | PRN

## 2018-01-02 MED ORDER — SODIUM CHLORIDE 0.9 % IV SOLN
2.0000 g | INTRAVENOUS | Status: DC
  Administered 2018-01-02: 2 g via INTRAVENOUS
  Filled 2018-01-02: qty 2

## 2018-01-02 MED ORDER — SODIUM CHLORIDE 0.9 % IV BOLUS (SEPSIS)
1000.0000 mL | Freq: Once | INTRAVENOUS | Status: AC
  Administered 2018-01-02: 1000 mL via INTRAVENOUS

## 2018-01-02 MED ORDER — SODIUM CHLORIDE 0.9 % IV BOLUS (SEPSIS)
500.0000 mL | Freq: Once | INTRAVENOUS | Status: AC
  Administered 2018-01-02: 500 mL via INTRAVENOUS

## 2018-01-02 MED ORDER — LEVOFLOXACIN IN D5W 750 MG/150ML IV SOLN: 750.0000 mg | Freq: Once | INTRAVENOUS | Status: DC

## 2018-01-02 MED ORDER — VANCOMYCIN HCL IN DEXTROSE 1-5 GM/200ML-% IV SOLN
1000.0000 mg | Freq: Once | INTRAVENOUS | Status: AC
  Administered 2018-01-02: 1000 mg via INTRAVENOUS
  Filled 2018-01-02: qty 200

## 2018-01-02 MED ORDER — ACETAMINOPHEN 325 MG PO TABS: 650.0000 mg | ORAL_TABLET | Freq: Four times a day (QID) | ORAL | Status: DC | PRN

## 2018-01-02 MED ORDER — HYDRALAZINE HCL 20 MG/ML IJ SOLN
5.0000 mg | INTRAMUSCULAR | Status: DC | PRN
  Administered 2018-01-05: 5 mg via INTRAVENOUS
  Filled 2018-01-02: qty 1

## 2018-01-02 MED ORDER — ONDANSETRON HCL 4 MG PO TABS: 4.0000 mg | ORAL_TABLET | Freq: Four times a day (QID) | ORAL | Status: DC | PRN

## 2018-01-02 NOTE — H&P (Addendum)
History and Physical    XOCHILT [name]:[id number] DOB: February 14, 1936 DOA: 01/02/2018  Referring MD/NP/PA:   PCP: Kirt Boys, DO   Patient coming from:  The patient is coming from home.  At baseline, pt is independent for most of ADL.  Chief Complaint: AMS and fever  HPI: Carol [name] is a 82 y.o. female with medical history significant of hypertension, hyperlipidemia, depression,mentia, formal smoker, varicose vein, UTI (positiver urine culture for MRSA 06/01/15), who presents with altered mental status and fever  Per patient's daughters, patient has history of dementia, normally can take care of herself, communicate with family members and is oriented to place and person, most of time also oriented to time. In the past 2 days, patient is more confused, less interactive with family, has poor appetite and decreased oral intake. She has fever and chills. She also has episode of loss control of bladder and bowel movement.  No unilateral weakness in extremities, No facial droop or slurred speech per daughters.  Patient does not seem to have chest pain and cough, but her breathing seems to be heavy for her daughters..  No active nausea, vomiting, diarrhea or abdominal pain.  Not sure if she has symptoms of UTI.  She moves all extremities normally  ED Course: pt was found to have positive urinalysis with large amount of leukocyte, WBC 50, many bacteria, cloudy appearance (squamous cell 11-20), WBC 22.9, lactic acid 3.99, AKI with creatinine 1.65, BUN 18, temperature 100.8, tachycardia, no tachypnea, oxygen saturation 92 to 95% on room air, negative CT head for acute intracranial abnormalities, pending chest x-ray.  Patient is admitted to telemetry bed as inpatient.  Review of Systems: Could not reviewed accurately due to altered mental status.  Allergy:  Allergies  Allergen Reactions  . Penicillins Swelling and Rash    Has patient had a PCN reaction causing immediate rash,  facial/tongue/throat swelling, SOB or lightheadedness with hypotension: Yes Has patient had a PCN reaction causing severe rash involving mucus membranes or skin necrosis: No Has patient had a PCN reaction that required hospitalization: No Has patient had a PCN reaction occurring within the last 10 years: No If all of the above answers are "NO", then may proceed with Cephalosporin use.    Past Medical History:  Diagnosis Date  . Anxiety state, unspecified   . Cataract   . Depressive disorder, not elsewhere classified   . Lumbago   . Memory loss   . Other and unspecified hyperlipidemia   . Rickets, late effect   . Tobacco use disorder   . Unspecified constipation   . Unspecified essential hypertension   . Varicose veins of lower extremities     Past Surgical History:  Procedure Laterality Date  . CYST EXCISION Bilateral    behind ears  . MOUTH SURGERY  04/12/2014    Social History:  reports that she quit smoking about 7 months ago. She quit after 55.00 years of use. She has never used smokeless tobacco. She reports that she does not drink alcohol or use drugs.  Family History:  Family History  Problem Relation Age of Onset  . Heart disease Father      Prior to Admission medications   Medication Sig Start Date End Date Taking? Authorizing Provider  acetaminophen (TYLENOL) 325 MG tablet Take 325 mg by mouth daily as needed (pain).   Yes [provider]  alendronate (FOSAMAX) 70 MG tablet TAKE 1 TABLET(70 MG) BY MOUTH EVERY 7 DAYS WITH A FULL GLASS  OF WATER AND ON AN EMPTY STOMACH Patient taking differently: TAKE 1 TABLET(70 MG) BY MOUTH EVERY 7 DAYS WITH A FULL GLASS OF WATER AND ON AN EMPTY STOMACH - ON FRIDAYS 09/09/17  Yes Montez Morita, Monica, DO  amLODipine (NORVASC) 5 MG tablet Take 1 tablet (5 mg total) by mouth daily. Patient taking differently: Take 5 mg by mouth daily with supper.  04/28/17  Yes Montez Morita, Monica, DO  atorvastatin (LIPITOR) 20 MG tablet TAKE 1 TABLET BY  MOUTH EVERY DAY FOR CHOLESTEROL Patient taking differently: Take 20 mg by mouth daily with supper. FOR CHOLESTEROL 04/28/17  Yes Kirt Boys, DO  Memantine HCl-Donepezil HCl [facility]) 28-10 MG CP24 Take 1 capsule by mouth daily. Patient taking differently: Take 1 capsule by mouth daily with supper.  06/30/17  Yes Kirt Boys, DO  Multiple Vitamins-Minerals (MULTIVITAMIN WITH MINERALS) tablet Take 1 tablet by mouth daily with supper. Centrum Silver   Yes [provider]  nystatin (MYCOSTATIN/NYSTOP) powder Apply topically 2 (two) times daily. To groin and abdomen area 10/29/16  Yes Montez Morita, Monica, DO  QUEtiapine (SEROQUEL) 100 MG tablet TAKE 1 TABLET BY MOUTH EVERY MORNING AND 1 TABLET AT BEDTIME Patient taking differently: Take 100 mg by mouth 2 (two) times daily. 11am and bedtime 11/11/17  Yes Montez Morita, Monica, DO  rivastigmine (EXELON) 13.3 MG/24HR Place 1 patch (13.3 mg total) onto the skin daily. Patient taking differently: Place 13.3 mg onto the skin daily after supper.  06/30/17  Yes Montez Morita, Monica, DO  polyethylene glycol powder (GLYCOLAX/MIRALAX) powder Take 17 g by mouth daily. Patient not taking: Reported on 01/02/2018 03/01/13   Oneal Grout, MD    Physical Exam: Vitals:   01/02/18 1817 01/02/18 2010 01/02/18 2030 01/02/18 2045  BP:   (!) 153/125 (!) 163/137  Pulse:   (!) 125 (!) 113  Resp:   (!) 28 (!) 24  Temp:  (!) 104 F (40 C)    TempSrc:  Rectal    SpO2:   (!) 89% (!) 88%  Weight: 73 kg (161 lb)     Height: 5\' 7"  (1.702 m)      General: Not in acute distress HEENT:       Eyes: PERRL, EOMI, no scleral icterus.       ENT: No discharge from the ears and nose, no pharynx injection, no tonsillar enlargement.        Neck: No JVD, no bruit, no mass felt. Heme: No neck lymph node enlargement. Cardiac: S1/S2, RRR, No murmurs, No gallops or rubs. Respiratory: No rales, wheezing, rhonchi or rubs. GI: Soft, nondistended, nontender, no organomegaly, BS present. GU: No  hematuria Ext: No pitting leg edema bilaterally. 2+DP/PT pulse bilaterally. Musculoskeletal: No joint deformities, No joint redness or warmth, no limitation of ROM in spin. Skin: No rashes.  Neuro: confused, knows her own name and her daughter's name, but not oriented to time and place, cranial nerves II-XII grossly intact, moves all extremities . Psych: Patient is not psychotic, no suicidal or hemocidal ideation.  Labs on Admission: I have personally reviewed following labs and imaging studies  CBC: Recent Labs  Lab 01/02/18 1638  WBC 22.9*  HGB 12.8  HCT 41.6  MCV 94.5  PLT 245   Basic Metabolic Panel: Recent Labs  Lab 01/02/18 1638  NA 140  K 3.5  CL 107  CO2 21*  GLUCOSE 140*  BUN 18  CREATININE 1.65*  CALCIUM 8.6*   GFR: Estimated Creatinine Clearance: 26.4 mL/min (A) (by C-G formula based on  SCr of 1.65 mg/dL (H)). Liver Function Tests: Recent Labs  Lab 01/02/18 1638  AST 26  ALT 12  ALKPHOS 75  BILITOT 1.0  PROT 7.7  ALBUMIN 3.0*   No results for input(s): LIPASE, AMYLASE in the last 168 hours. No results for input(s): AMMONIA in the last 168 hours. Coagulation Profile: No results for input(s): INR, PROTIME in the last 168 hours. Cardiac Enzymes: No results for input(s): CKTOTAL, CKMB, CKMBINDEX, TROPONINI in the last 168 hours. BNP (last 3 results) No results for input(s): PROBNP in the last 8760 hours. HbA1C: No results for input(s): HGBA1C in the last 72 hours. CBG: Recent Labs  Lab 01/02/18 1721  GLUCAP 129*   Lipid Profile: No results for input(s): CHOL, HDL, LDLCALC, TRIG, CHOLHDL, LDLDIRECT in the last 72 hours. Thyroid Function Tests: No results for input(s): TSH, T4TOTAL, FREET4, T3FREE, THYROIDAB in the last 72 hours. Anemia Panel: No results for input(s): VITAMINB12, FOLATE, FERRITIN, TIBC, IRON, RETICCTPCT in the last 72 hours. Urine analysis:    Component Value Date/Time   COLORURINE YELLOW 01/02/2018 1751   APPEARANCEUR  CLOUDY (A) 01/02/2018 1751   APPEARANCEUR Cloudy (A) 06/01/2015 1225   LABSPEC 1.016 01/02/2018 1751   PHURINE 5.0 01/02/2018 1751   GLUCOSEU NEGATIVE 01/02/2018 1751   HGBUR SMALL (A) 01/02/2018 1751   BILIRUBINUR NEGATIVE 01/02/2018 1751   BILIRUBINUR Neg 11/11/2017 1500   BILIRUBINUR Negative 06/01/2015 1225   KETONESUR NEGATIVE 01/02/2018 1751   PROTEINUR 100 (A) 01/02/2018 1751   UROBILINOGEN 0.2 11/11/2017 1500   UROBILINOGEN 0.2 10/26/2010 1137   NITRITE NEGATIVE 01/02/2018 1751   LEUKOCYTESUR LARGE (A) 01/02/2018 1751   LEUKOCYTESUR Trace (A) 06/01/2015 1225   Sepsis Labs: @LABRCNTIP (procalcitonin:4,lacticidven:4) )No results found for this or any previous visit (from the past 240 hour(s)).   Radiological Exams on Admission: Ct Head Wo Contrast  Result Date: 01/02/2018 CLINICAL DATA:  82 year old female with altered mental status. EXAM: CT HEAD WITHOUT CONTRAST TECHNIQUE: Contiguous axial images were obtained from the base of the skull through the vertex without intravenous contrast. COMPARISON:  05/17/2015 and prior CTs FINDINGS: Brain: No evidence of acute infarction, hemorrhage, hydrocephalus, extra-axial collection or mass lesion/mass effect. Atrophy and chronic small-vessel white matter ischemic changes noted. Vascular: Intracranial atherosclerotic calcifications noted periods Skull: Normal. Negative for fracture or focal lesion. Sinuses/Orbits: No acute finding. Chronic RIGHT maxillary sinus mucoperiosteal thickening noted. Other: None. IMPRESSION: 1. No evidence of acute intracranial abnormality 2. Atrophy and chronic small-vessel white matter ischemic changes. Electronically Signed   By: Harmon PierJeffrey  Hu M.D.   On: 01/02/2018 19:20     EKG: Independently reviewed.  Sinus rhythm, QTC 406, Q waves in inferior leads, LAE.   Assessment/Plan Principal Problem:   UTI (urinary tract infection) Active Problems:   Mixed Alzheimer's and vascular dementia   Hyperlipidemia   AKI  (acute kidney injury) (HCC)   Acute metabolic encephalopathy   Sepsis (HCC)   Sepsis due to UTI: Patient patient meets criteria for sepsis with leukocytosis, fever, tachycardia.  Lactic acid elevated at 3.99, currently hemodynamically stable.  Patient had history of urinary tract infection, with positive urine culture for MRSA 2016.  Patient was started with broad antibiotics, vancomycin and cefepime in ED, will continue vancomycin and switch cefepime to rocephin (pt is allergic to penicillin, but per pharmacist, patient tolerated cephalosporin in the past). -will admit to tele bed as inpt -IV vanco and rocephin -f/u Bx and Ux -will get Procalcitonin and trend lactic acid levels per sepsis protocol. -IVF:  2.5L of NS bolus in ED, followed by 125 cc/h-->75 cc/h   Acute metabolic encephalopathy: likely due to UTI. Ct-head negative -Frequent neuro check -Hold oral medications until mental status improves  Mixed Alzheimer's and vascular dementia: -continue  Exelon patch -hold Namzaric  HTN: -hold amlodipine -IV hydralazine.  Hyperlipidemia: -hold Lipitor  AKI: Likely due to UTI - IVF as above - Follow up renal function by BMP  Addendum:  Her CXR is abnormal, showing some patchy and interstitial opacity. Patient does not seem to have chest pain and cough, but her breathing seems to be heavy for her daughters. Not sure if pt has PNA, she is on vancomycin and Rocephin, which should cover.  -will get sputum culture -SLP  CXR findings:  1. Cardiomegaly with tortuous atherosclerotic aorta. Aortic arch tortuosity is more prominent than prior exam, likely secondary to differences in technique. Recommend follow-up PA and lateral views when patient is able to take better inspiration. 2. Diffusely increased interstitial opacities may represent pulmonary edema, atypical infection, or less likely bronchovascular crowding secondary to low lung volumes. 3. Patchy retrocardiac and peripheral  bibasilar opacities, pneumonia or aspiration is considered in the setting of fever.    DVT ppx: SQ Lovenox Code Status: Full code (this is dicussed with her 2 daughters who are POA) Family Communication: Yes, patient's 2 daughers at bed side Disposition Plan:  Anticipate discharge back to previous home environment Consults called:  none Admission status:  Inpatient/tele      Date of Service 01/02/2018    Lorretta Harp [facility] Pager [phone]  If 7PM-7AM, please contact night-coverage [email or web address] Password [facility] 01/02/2018, 8:53 PM

## 2018-01-02 NOTE — ED Notes (Signed)
Attempted to call report x 1  

## 2018-01-02 NOTE — ED Notes (Signed)
Patient transported to CT 

## 2018-01-02 NOTE — ED Triage Notes (Signed)
Pt has had a drastic change in cognitive ability.  While she does have a hx of dementia, she has had a drastic change in her abilities in the past 48 hours.  While she does speak she is far less commutative, unable to walk, loss of control of her bladder and bowel.  She is only oriented to name.  Fever of 100.8.

## 2018-01-02 NOTE — ED Notes (Signed)
Patient transported to X-ray 

## 2018-01-02 NOTE — Progress Notes (Signed)
Pharmacy Antibiotic Note  Carol IgoCarolyn M [name] is a 82 y.o. female admitted on 01/02/2018 with sepsis. Pharmacy has been consulted for vancomycin and cefepime dosing. WBC 22.9, temp 100.8. Scr 1.65 (BL ~0.9), estimated CrCl ~35 mL/min.  Plan: Vancomycin 1000mg  IV q24h Cefepime 2g IV q24h F/u clinical status, renal function, C&S, de-escalation, LOT, vancomycin levels as indicated  Height: 5\' 7"  (170.2 cm) IBW/kg (Calculated) : 61.6  Temp (24hrs), Avg:100.8 F (38.2 C), Min:100.8 F (38.2 C), Max:100.8 F (38.2 C)  Recent Labs  Lab 01/02/18 1638  WBC 22.9*  CREATININE 1.65*    CrCl cannot be calculated (Unknown ideal weight.).    Allergies  Allergen Reactions  . Penicillins Swelling and Rash    Has patient had a PCN reaction causing immediate rash, facial/tongue/throat swelling, SOB or lightheadedness with hypotension: Yes Has patient had a PCN reaction causing severe rash involving mucus membranes or skin necrosis: No Has patient had a PCN reaction that required hospitalization: No Has patient had a PCN reaction occurring within the last 10 years: No If all of the above answers are "NO", then may proceed with Cephalosporin use.   Antimicrobials this admission: Vanc 7/27 >> Cefepime 7/27 >>  Microbiology results: 7/27 BCx: pending  Thank you for allowing pharmacy to be a part of this patient's care.  Roderic ScarceErin N. Zigmund Danieleja, PharmD PGY2 Infectious Disease Pharmacy Resident Phone: [phone] 01/02/2018 5:28 PM

## 2018-01-02 NOTE — ED Notes (Signed)
I Stat Lac Acid results of 3.99 reported to Dr. Fredderick PhenixBelfi, and Army MeliaLaura  Murphy PA.

## 2018-01-02 NOTE — ED Provider Notes (Signed)
[facility] EMERGENCY DEPARTMENT Provider Note   CSN: [id number] Arrival date & time: 01/02/18  1546     History   Chief Complaint Chief Complaint  Patient presents with  . Altered Mental Status  . Fever    HPI Carol [name] is a 82 y.o. female.  82yo female brought in by her daughters for loss of bowel and bladder control onset yesterday.  Patient lives with her daughters, has a history of dementia. Daughters noticed yesterday that patient was incontinent of urine and stool, urine with foul odor, no reports of blood in the stools. Also note patient has not had an appetite recently, did not recognize her one daughter right away like she normally would, right like is fidgety. Otherwise gait is unchanged. Upon arrival, noted to have a temp of 100.8, unaware of fever prior to ER. No sick contacts, no cough, no recent travel, no recent abx use.       Past Medical History:  Diagnosis Date  . Anxiety state, unspecified   . Cataract   . Depressive disorder, not elsewhere classified   . Lumbago   . Memory loss   . Other and unspecified hyperlipidemia   . Rickets, late effect   . Tobacco use disorder   . Unspecified constipation   . Unspecified essential hypertension   . Varicose veins of lower extremities     Patient Active Problem List   Diagnosis Date Noted  . Electrolyte disturbance 08/01/2015  . Altered mental status 05/17/2015  . UTI (urinary tract infection) 05/17/2015  . AKI (acute kidney injury) (HCC) 05/17/2015  . Dementia with behavioral disturbance 06/08/2014  . Cataract 01/24/2014  . Osteopenia 12/14/2013  . Routine general medical examination at a health care facility 12/14/2013  . Depression due to dementia 09/14/2013  . Psychosis (HCC) 03/01/2013  . Need for prophylactic vaccination and inoculation against influenza 03/01/2013  . Unspecified constipation 03/01/2013  . Mixed Alzheimer's and vascular dementia 01/26/2013  . Essential  hypertension, benign 01/26/2013  . Hyperlipidemia 01/26/2013  . Other disorders of bone and cartilage(733.99) 01/26/2013  . Tobacco user 01/26/2013  . Constipation 01/26/2013  . Other and unspecified hyperlipidemia   . Tobacco use disorder   . Depressive disorder, not elsewhere classified   . Unspecified essential hypertension     Past Surgical History:  Procedure Laterality Date  . CYST EXCISION Bilateral    behind ears  . MOUTH SURGERY  04/12/2014     OB History   None      Home Medications    Prior to Admission medications   Medication Sig Start Date End Date Taking? Authorizing Provider  acetaminophen (TYLENOL) 325 MG tablet Take 325 mg by mouth daily as needed (pain).   Yes [provider]  alendronate (FOSAMAX) 70 MG tablet TAKE 1 TABLET(70 MG) BY MOUTH EVERY 7 DAYS WITH A FULL GLASS OF WATER AND ON AN EMPTY STOMACH Patient taking differently: TAKE 1 TABLET(70 MG) BY MOUTH EVERY 7 DAYS WITH A FULL GLASS OF WATER AND ON AN EMPTY STOMACH - ON FRIDAYS 09/09/17  Yes Montez Morita, Monica, DO  amLODipine (NORVASC) 5 MG tablet Take 1 tablet (5 mg total) by mouth daily. Patient taking differently: Take 5 mg by mouth daily with supper.  04/28/17  Yes Montez Morita, Monica, DO  atorvastatin (LIPITOR) 20 MG tablet TAKE 1 TABLET BY MOUTH EVERY DAY FOR CHOLESTEROL Patient taking differently: Take 20 mg by mouth daily with supper. FOR CHOLESTEROL 04/28/17  Yes Kirt Boys, DO  Memantine HCl-Donepezil HCl (NAMZARIC) 28-10 MG CP24 Take 1 capsule by mouth daily. Patient taking differently: Take 1 capsule by mouth daily with supper.  06/30/17  Yes Kirt Boys, DO  Multiple Vitamins-Minerals (MULTIVITAMIN WITH MINERALS) tablet Take 1 tablet by mouth daily with supper. Centrum Silver   Yes [provider]  nystatin (MYCOSTATIN/NYSTOP) powder Apply topically 2 (two) times daily. To groin and abdomen area 10/29/16  Yes Montez Morita, Monica, DO  QUEtiapine (SEROQUEL) 100 MG tablet TAKE 1 TABLET  BY MOUTH EVERY MORNING AND 1 TABLET AT BEDTIME Patient taking differently: Take 100 mg by mouth 2 (two) times daily. 11am and bedtime 11/11/17  Yes Montez Morita, Monica, DO  rivastigmine (EXELON) 13.3 MG/24HR Place 1 patch (13.3 mg total) onto the skin daily. Patient taking differently: Place 13.3 mg onto the skin daily after supper.  06/30/17  Yes Montez Morita, Monica, DO  polyethylene glycol powder (GLYCOLAX/MIRALAX) powder Take 17 g by mouth daily. Patient not taking: Reported on 01/02/2018 03/01/13   Oneal Grout, MD    Family History Family History  Problem Relation Age of Onset  . Heart disease Father     Social History Social History   Tobacco Use  . Smoking status: Former Smoker    Years: 55.00    Last attempt to quit: 05/09/2017    Years since quitting: 0.6  . Smokeless tobacco: Never Used  Substance Use Topics  . Alcohol use: No    Alcohol/week: 0.0 oz  . Drug use: No     Allergies   Penicillins   Review of Systems Review of Systems  Unable to perform ROS: Dementia  Constitutional: Positive for appetite change and fever.  Gastrointestinal: Positive for diarrhea. Negative for abdominal pain, blood in stool and vomiting.  Genitourinary: Negative for dysuria.  Musculoskeletal: Negative for gait problem.  Skin: Negative for rash and wound.  Allergic/Immunologic: Negative for immunocompromised state.  Neurological: Negative for weakness.  Psychiatric/Behavioral: Positive for confusion.     Physical Exam Updated Vital Signs BP 117/70   Pulse 93   Temp (!) 100.8 F (38.2 C) (Oral)   Resp 16   Ht 5\' 7"  (1.702 m)   Wt 73 kg (161 lb)   SpO2 92%   BMI 25.22 kg/m   Physical Exam  Constitutional: She appears well-developed and well-nourished. No distress.  HENT:  Head: Normocephalic and atraumatic.  Eyes: Conjunctivae are normal.  Neck: Normal range of motion. Neck supple.  Cardiovascular: Intact distal pulses. Tachycardia present.  No murmur heard. Pulmonary/Chest:  Effort normal and breath sounds normal. No respiratory distress.  Abdominal: Soft. She exhibits no distension. There is no tenderness.  Genitourinary: Rectum normal. Rectal exam shows anal tone normal.  Musculoskeletal: She exhibits no tenderness.  Lymphadenopathy:    She has no cervical adenopathy.  Neurological: She is alert. She has normal strength. No cranial nerve deficit or sensory deficit. GCS eye subscore is 4. GCS verbal subscore is 4. GCS motor subscore is 6.  Normal arm/leg strength/no drift.  Skin: Skin is warm and dry. No rash noted. She is not diaphoretic.  Nursing note and vitals reviewed.    ED Treatments / Results  Labs (all labs ordered are listed, but only abnormal results are displayed) Labs Reviewed  COMPREHENSIVE METABOLIC PANEL - Abnormal; Notable for the following components:      Result Value   CO2 21 (*)    Glucose, Bld 140 (*)    Creatinine, Ser 1.65 (*)    Calcium 8.6 (*)  Albumin 3.0 (*)    GFR calc non Af Amer 28 (*)    GFR calc Af Amer 33 (*)    All other components within normal limits  CBC - Abnormal; Notable for the following components:   WBC 22.9 (*)    All other components within normal limits  URINALYSIS, ROUTINE W REFLEX MICROSCOPIC - Abnormal; Notable for the following components:   APPearance CLOUDY (*)    Hgb urine dipstick SMALL (*)    Protein, ur 100 (*)    Leukocytes, UA LARGE (*)    WBC, UA >50 (*)    Bacteria, UA MANY (*)    All other components within normal limits  CBG MONITORING, ED - Abnormal; Notable for the following components:   Glucose-Capillary 129 (*)    All other components within normal limits  I-STAT CG4 LACTIC ACID, ED - Abnormal; Notable for the following components:   Lactic Acid, Venous 3.99 (*)    All other components within normal limits  CULTURE, BLOOD (ROUTINE X 2)  CULTURE, BLOOD (ROUTINE X 2)  I-STAT CG4 LACTIC ACID, ED    EKG EKG Interpretation  Date/Time:  Saturday January 02 2018 16:02:28  EDT Ventricular Rate:  103 PR Interval:  144 QRS Duration: 90 QT Interval:  310 QTC Calculation: 406 R Axis:   -7 Text Interpretation:  Sinus tachycardia Moderate voltage criteria for LVH, may be normal variant Inferior infarct , age undetermined Anterolateral infarct , age undetermined Abnormal ECG Confirmed by Rolan Bucco [phone]) on 01/02/2018 6:14:54 PM   Radiology Ct Head Wo Contrast  Result Date: 01/02/2018 CLINICAL DATA:  82 year old female with altered mental status. EXAM: CT HEAD WITHOUT CONTRAST TECHNIQUE: Contiguous axial images were obtained from the base of the skull through the vertex without intravenous contrast. COMPARISON:  05/17/2015 and prior CTs FINDINGS: Brain: No evidence of acute infarction, hemorrhage, hydrocephalus, extra-axial collection or mass lesion/mass effect. Atrophy and chronic small-vessel white matter ischemic changes noted. Vascular: Intracranial atherosclerotic calcifications noted periods Skull: Normal. Negative for fracture or focal lesion. Sinuses/Orbits: No acute finding. Chronic RIGHT maxillary sinus mucoperiosteal thickening noted. Other: None. IMPRESSION: 1. No evidence of acute intracranial abnormality 2. Atrophy and chronic small-vessel white matter ischemic changes. Electronically Signed   By: Harmon Pier M.D.   On: 01/02/2018 19:20    Procedures .Critical Care Performed by: Jeannie Fend, PA-C Authorized by: Jeannie Fend, PA-C   Critical care provider statement:    Critical care time (minutes):  70   Critical care start time:  01/02/2018 5:00 PM   Critical care time was exclusive of:  Separately billable procedures and treating other patients   Critical care was necessary to treat or prevent imminent or life-threatening deterioration of the following conditions:  Sepsis   Critical care was time spent personally by me on the following activities:  Discussions with consultants, evaluation of patient's response to treatment, examination of  patient, ordering and performing treatments and interventions, ordering and review of laboratory studies, ordering and review of radiographic studies, pulse oximetry, re-evaluation of patient's condition, obtaining history from patient or surrogate and review of old charts   I assumed direction of critical care for this patient from another provider in my specialty: no     (including critical care time)  Medications Ordered in ED Medications  vancomycin (VANCOCIN) IVPB 1000 mg/200 mL premix (has no administration in time range)  ceFEPIme (MAXIPIME) 2 g in sodium chloride 0.9 % 100 mL IVPB (0 g Intravenous Stopped 01/02/18  1855)  acetaminophen (TYLENOL) suppository 650 mg (has no administration in time range)  sodium chloride 0.9 % bolus 1,000 mL (0 mLs Intravenous Stopped 01/02/18 1855)    And  sodium chloride 0.9 % bolus 1,000 mL (1,000 mLs Intravenous New Bag/Given 01/02/18 1819)    And  sodium chloride 0.9 % bolus 500 mL (500 mLs Intravenous New Bag/Given 01/02/18 1855)  vancomycin (VANCOCIN) IVPB 1000 mg/200 mL premix (1,000 mg Intravenous New Bag/Given 01/02/18 1854)     Initial Impression / Assessment and Plan / ED Course  I have reviewed the triage vital signs and the nursing notes.  Pertinent labs & imaging results that were available during my care of the patient were reviewed by me and considered in my medical decision making (see chart for details).  Clinical Course as of Jan 02 2006  Sat Jan 02, 2018  1947 82yo female brought in by family for loss of bowel and bladder control since yesterday. Febrile in triage with temp of 100.8, WBC 22.9k, HR 104, septic work up initiated with broad coverage abx and IV fluids. CXR pending, UA with likely UTI, lactic acid 3.99. Patient has dementia and is not reliable for ROS, on exam, lungs clear, abdomen is soft/non tender, normal rectal tone. CT head ordered due to family report of right leg movement/ "twitching" per family this is new- CT head  without acute findings. CMP with mildly elevated Cr. Discussed results and POC with family and patient. Patient is alert, capillary refill normal, resting. Patient discussed with Dr. Fredderick PhenixBelfi, ER attending who has also seen the patient, hospitalist paged for admission for urosepsis.    [LM]    Clinical Course User Index [LM] Jeannie FendMurphy, Laura A, PA-C    Final Clinical Impressions(s) / ED Diagnoses   Final diagnoses:  Sepsis, due to unspecified organism [facility](HCC)  Urinary tract infection in elderly patient  Incontinence of feces, unspecified fecal incontinence type    ED Discharge Orders    None       Alden HippMurphy, Laura A, PA-C 01/02/18 2007    Rolan BuccoBelfi, Melanie, MD 01/02/18 2208

## 2018-01-03 ENCOUNTER — Encounter (HOSPITAL_COMMUNITY): Payer: Self-pay | Admitting: *Deleted

## 2018-01-03 LAB — BLOOD CULTURE ID PANEL (REFLEXED)
Acinetobacter baumannii: NOT DETECTED
CANDIDA ALBICANS: NOT DETECTED
CANDIDA KRUSEI: NOT DETECTED
CANDIDA PARAPSILOSIS: NOT DETECTED
CANDIDA TROPICALIS: NOT DETECTED
Candida glabrata: NOT DETECTED
Carbapenem resistance: NOT DETECTED
ENTEROBACTER CLOACAE COMPLEX: NOT DETECTED
ENTEROBACTERIACEAE SPECIES: DETECTED — AB
Enterococcus species: NOT DETECTED
Escherichia coli: DETECTED — AB
Haemophilus influenzae: NOT DETECTED
KLEBSIELLA OXYTOCA: NOT DETECTED
KLEBSIELLA PNEUMONIAE: NOT DETECTED
Listeria monocytogenes: NOT DETECTED
Neisseria meningitidis: NOT DETECTED
PROTEUS SPECIES: NOT DETECTED
PSEUDOMONAS AERUGINOSA: NOT DETECTED
STAPHYLOCOCCUS AUREUS BCID: NOT DETECTED
STAPHYLOCOCCUS SPECIES: NOT DETECTED
STREPTOCOCCUS AGALACTIAE: NOT DETECTED
STREPTOCOCCUS PNEUMONIAE: NOT DETECTED
Serratia marcescens: NOT DETECTED
Streptococcus pyogenes: NOT DETECTED
Streptococcus species: NOT DETECTED

## 2018-01-03 LAB — CBC
HEMATOCRIT: 36.1 % (ref 36.0–46.0)
Hemoglobin: 11 g/dL — ABNORMAL LOW (ref 12.0–15.0)
MCH: 28.5 pg (ref 26.0–34.0)
MCHC: 30.5 g/dL (ref 30.0–36.0)
MCV: 93.5 fL (ref 78.0–100.0)
Platelets: 167 10*3/uL (ref 150–400)
RBC: 3.86 MIL/uL — AB (ref 3.87–5.11)
RDW: 14.4 % (ref 11.5–15.5)
WBC: 23.4 10*3/uL — ABNORMAL HIGH (ref 4.0–10.5)

## 2018-01-03 LAB — BASIC METABOLIC PANEL
ANION GAP: 9 (ref 5–15)
BUN: 16 mg/dL (ref 8–23)
CALCIUM: 7.9 mg/dL — AB (ref 8.9–10.3)
CO2: 22 mmol/L (ref 22–32)
Chloride: 113 mmol/L — ABNORMAL HIGH (ref 98–111)
Creatinine, Ser: 1.46 mg/dL — ABNORMAL HIGH (ref 0.44–1.00)
GFR, EST AFRICAN AMERICAN: 38 mL/min — AB (ref >60)
GFR, EST NON AFRICAN AMERICAN: 33 mL/min — AB (ref >60)
Glucose, Bld: 98 mg/dL (ref 70–99)
Potassium: 3.4 mmol/L — ABNORMAL LOW (ref 3.5–5.1)
SODIUM: 144 mmol/L (ref 135–145)

## 2018-01-03 LAB — LACTIC ACID, PLASMA: LACTIC ACID, VENOUS: 1.3 mmol/L (ref 0.5–1.9)

## 2018-01-03 MED ORDER — POTASSIUM CHLORIDE CRYS ER 20 MEQ PO TBCR
20.0000 meq | EXTENDED_RELEASE_TABLET | Freq: Two times a day (BID) | ORAL | Status: AC
  Administered 2018-01-03 – 2018-01-04 (×4): 20 meq via ORAL
  Filled 2018-01-03 (×4): qty 1

## 2018-01-03 MED ORDER — QUETIAPINE FUMARATE 25 MG PO TABS
100.0000 mg | ORAL_TABLET | Freq: Every day | ORAL | Status: DC
  Administered 2018-01-03 – 2018-01-06 (×4): 100 mg via ORAL
  Filled 2018-01-03 (×4): qty 4

## 2018-01-03 MED ORDER — DONEPEZIL HCL 10 MG PO TABS
10.0000 mg | ORAL_TABLET | Freq: Every day | ORAL | Status: DC
  Administered 2018-01-03 – 2018-01-07 (×4): 10 mg via ORAL
  Filled 2018-01-03 (×4): qty 1

## 2018-01-03 MED ORDER — MEMANTINE HCL ER 28 MG PO CP24
28.0000 mg | ORAL_CAPSULE | Freq: Every day | ORAL | Status: DC
  Administered 2018-01-03 – 2018-01-07 (×4): 28 mg via ORAL
  Filled 2018-01-03 (×5): qty 1

## 2018-01-03 MED ORDER — SODIUM CHLORIDE 0.9 % IV BOLUS
500.0000 mL | Freq: Once | INTRAVENOUS | Status: AC
  Administered 2018-01-03: 500 mL via INTRAVENOUS

## 2018-01-03 MED ORDER — SODIUM CHLORIDE 0.9 % IV SOLN
2.0000 g | INTRAVENOUS | Status: DC
  Administered 2018-01-03: 2 g via INTRAVENOUS
  Filled 2018-01-03: qty 20

## 2018-01-03 NOTE — Progress Notes (Signed)
CRITICAL VALUE ALERT  Critical Value:  Lactic Acid = 4.6  Date & Time Notied:  01-03-18 @ 12:12 am  Provider Notified:Bodenheimer  Orders Received/Actions taken: waiting for order

## 2018-01-03 NOTE — Evaluation (Signed)
Clinical/Bedside Swallow Evaluation Patient Details  Name: Carol [name] MRN: [id number] Date of Birth: 05-07-1936  Today's Date: 01/03/2018 Time: SLP Start Time (ACUTE ONLY): 1027 SLP Stop Time (ACUTE ONLY): 1049 SLP Time Calculation (min) (ACUTE ONLY): 22 min  Past Medical History:  Past Medical History:  Diagnosis Date  . Anxiety state, unspecified   . Cataract   . Depressive disorder, not elsewhere classified   . Lumbago   . Memory loss   . Other and unspecified hyperlipidemia   . Rickets, late effect   . Tobacco use disorder   . Unspecified constipation   . Unspecified essential hypertension   . Varicose veins of lower extremities    Past Surgical History:  Past Surgical History:  Procedure Laterality Date  . CYST EXCISION Bilateral    behind ears  . MOUTH SURGERY  04/12/2014   HPI:  Pt is an 82 y.o. female with medical history significant of hypertension, hyperlipidemia, depression, dementia, former smoker, varicose vein, UTI (positiver urine culture for MRSA 06/01/15), who presents with AMS and fever. Admitted for sepsis from UTI, although CXR is also concerning for possible PNA in the setting of fevers.    Assessment / Plan / Recommendation Clinical Impression  Pt's oropharyngeal swallow appears functional. No overt s/s of aspiration are observed across self-fed trials. CXR is concerning for possible PNA, although per MD note she has not had any coughing. Lung sounds are clear. Her biggest risk factor for aspiration is likely her mentation, which seems improved from initial reports in the ED. Recommend starting regular diet and thin liquids with brief SLP f/u for tolerance. SLP Visit Diagnosis: Dysphagia, unspecified (R13.10)    Aspiration Risk  Mild aspiration risk    Diet Recommendation Regular;Thin liquid   Liquid Administration via: Cup;Straw Medication Administration: Whole meds with liquid Supervision: Patient able to self feed;Intermittent supervision  to cue for compensatory strategies Compensations: Slow rate;Small sips/bites Postural Changes: Seated upright at 90 degrees    Other  Recommendations Oral Care Recommendations: Oral care BID   Follow up Recommendations 24 hour supervision/assistance      Frequency and Duration min 1 x/week  1 week       Prognosis        Swallow Study   General HPI: Pt is an 82 y.o. female with medical history significant of hypertension, hyperlipidemia, depression, dementia, former smoker, varicose vein, UTI (positiver urine culture for MRSA 06/01/15), who presents with AMS and fever. Admitted for sepsis from UTI, although CXR is also concerning for possible PNA in the setting of fevers.  Type of Study: Bedside Swallow Evaluation Previous Swallow Assessment: none in chart Diet Prior to this Study: NPO Temperature Spikes Noted: Yes(104) Respiratory Status: Room air History of Recent Intubation: No Behavior/Cognition: Alert;Cooperative;Pleasant mood;Confused Oral Cavity Assessment: Within Functional Limits Oral Care Completed by SLP: No Oral Cavity - Dentition: Adequate natural dentition Vision: Functional for self-feeding Self-Feeding Abilities: Able to feed self Patient Positioning: Upright in bed Baseline Vocal Quality: Normal Volitional Cough: Strong Volitional Swallow: Able to elicit    Oral/Motor/Sensory Function Overall Oral Motor/Sensory Function: Within functional limits   Ice Chips Ice chips: Not tested   Thin Liquid Thin Liquid: Within functional limits Presentation: Self Fed;Cup;Straw    Nectar Thick Nectar Thick Liquid: Not tested   Honey Thick Honey Thick Liquid: Not tested   Puree Puree: Within functional limits Presentation: Self Fed;Spoon   Solid     Solid: Within functional limits Presentation: Self Fed  Maxcine Hamaiewonsky, Ceara Wrightson 01/03/2018,11:07 AM  Maxcine HamLaura Paiewonsky, M.A. CCC-SLP [phone]

## 2018-01-03 NOTE — Progress Notes (Signed)
[facility] A&O to self. Speech is clear.  Denied pain. Breathing w/o difficulty. MAE.

## 2018-01-03 NOTE — Progress Notes (Signed)
[facility]                                                                              Patient Demographics  Carol [name], is a 82 y.o. female, DOB - [birth year], YQM:[id number]  Admit date - 01/02/2018   Admitting Physician Lorretta Harp, MD  Outpatient Primary MD for the patient is Kirt Boys, DO  Outpatient specialists:   LOS - 1  days    Chief Complaint  Patient presents with  . Altered Mental Status  . Fever       Brief summary   10 year lady with medical history  Significant for but not limited to hypertension and dementia presenting for altered mental status associated with fever.  Urinalysis in the ED noted large amount of leukocytes with many bacteria,   Leukocytosis of 22,900 and increase creatinine of 1.65, tachycardic.  A head CT was negative and chest x-ray was positive for patchy bibasilar infiltrate concerning for pneumonia possibly aspiration.    Assessment & Plan    Principal Problem:   UTI (urinary tract infection) Active Problems:   Mixed Alzheimer's and vascular dementia   Hyperlipidemia   AKI (acute kidney injury) (HCC)   Acute metabolic encephalopathy   Sepsis (HCC)     1. Sepsis:   Urinary and pulmonary source  continue IV antibiotics,  Supportive care,  Follow leukocytosis and cultures    2. UTI:   Continue antibiotics, follow urine culture report   3. Probable pneumonia:   Concern for aspiration-  Swallow eval by  Speech- continue antibiotics and follow up chest x-ray to resolution as outpatient on discharge  4.  Acute kidney injury:   Due to diagnosis #1-#3  continue gentle IV fluids with monitor renal function electrolytes   5.  Altered mental status:   Due to combination of toxic metabolic and infectious etiology as noted above   Improving  supportive care   Code Status:  Full code DVT Prophylaxis:  Lovenox  Family Communication: Discussed in detail with the patient, all imaging results, lab results  explained to the patient    Disposition Plan:TBD  Time Spent in minutes  35 minutes  Procedures:    Consultants:    a speech pathology  Antimicrobials:     vancomycin ceftriaxone  Medications  Scheduled Meds: . enoxaparin (LOVENOX) injection  30 mg Subcutaneous Q24H  . nystatin   Topical BID  . rivastigmine  13.3 mg Transdermal QPC supper   Continuous Infusions: . sodium chloride 75 mL/hr at 01/03/18 1103  . cefTRIAXone (ROCEPHIN)  IV     PRN Meds:.acetaminophen **OR** acetaminophen, hydrALAZINE, ondansetron **OR** ondansetron (ZOFRAN) IV   Antibiotics   Anti-infectives (From admission, onward)   Start     Dose/Rate Route Frequency Ordered Stop   01/03/18 1800  vancomycin (VANCOCIN) IVPB 1000 mg/200 mL premix  Status:  Discontinued     1,000 mg 200 mL/hr over 60 Minutes Intravenous Every 24 hours 01/02/18 1732 01/03/18 1045   01/03/18 1800  cefTRIAXone (ROCEPHIN) 1 g in sodium chloride 0.9 % 100 mL IVPB  Status:  Discontinued     1 g 200 mL/hr  over 30 Minutes Intravenous Every 24 hours 01/02/18 2109 01/03/18 0951   01/03/18 1800  cefTRIAXone (ROCEPHIN) 2 g in sodium chloride 0.9 % 100 mL IVPB     2 g 200 mL/hr over 30 Minutes Intravenous Every 24 hours 01/03/18 0951     01/02/18 1800  ceFEPIme (MAXIPIME) 2 g in sodium chloride 0.9 % 100 mL IVPB  Status:  Discontinued     2 g 200 mL/hr over 30 Minutes Intravenous Every 24 hours 01/02/18 1732 01/02/18 2055   01/02/18 1730  levofloxacin (LEVAQUIN) IVPB 750 mg  Status:  Discontinued     750 mg 100 mL/hr over 90 Minutes Intravenous  Once 01/02/18 1722 01/02/18 1727   01/02/18 1730  aztreonam (AZACTAM) 2 g in sodium chloride 0.9 % 100 mL IVPB  Status:  Discontinued     2 g 200 mL/hr over 30 Minutes Intravenous  Once 01/02/18 1722 01/02/18 1727   01/02/18 1730  vancomycin (VANCOCIN) IVPB 1000 mg/200 mL premix     1,000 mg 200 mL/hr over 60 Minutes Intravenous  Once 01/02/18 1722 01/02/18 2023        Subjective:     Carol [name] was seen and examined today.  Patient denies dizziness, chest pain, shortness of breath, abdominal pain, N/V/D/C,  No acute events reported overnight  Objective:   Vitals:   01/02/18 2100 01/02/18 2155 01/03/18 0147 01/03/18 0500  BP: 138/76 118/65 102/65 118/75  Pulse: (!) 112 (!) 103 90 82  Resp: (!) 22 16 18 16   Temp:  (!) 101.3 F (38.5 C) 98.8 F (37.1 C) 98.3 F (36.8 C)  TempSrc:  Oral Oral Oral  SpO2: 94% 96% 95% 97%  Weight:      Height:        Intake/Output Summary (Last 24 hours) at 01/03/2018 1113 Last data filed at 01/03/2018 1103 Gross per 24 hour  Intake 4617 ml  Output 1 ml  Net 4616 ml     Wt Readings from Last 3 Encounters:  01/02/18 73 kg (161 lb)  11/11/17 82.1 kg (181 lb)  11/11/17 82.1 kg (181 lb)     Exam  General: NAD  HEENT: NCAT,  PERRL,MMM  Neck: SUPPLE, (-) JVD  Cardiovascular: RRR, (-) GALLOP, (-) MURMUR  Respiratory: CTA  Gastrointestinal: SOFT, (-) DISTENSION, BS(+), (_) TENDERNESS  Ext: (-) CYANOSIS, (-) EDEMA  Neuro: A, OX 2  Skin:(-) RASH  Psych:NORMAL AFFECT/MOOD   Data Reviewed:  I have personally reviewed following labs and imaging studies  Micro Results Recent Results (from the past 240 hour(s))  Blood Culture (routine x 2)     Status: None (Preliminary result)   Collection Time: 01/02/18  7:36 PM  Result Value Ref Range Status   Specimen Description BLOOD RIGHT HAND  Final   Special Requests   Final    BOTTLES DRAWN AEROBIC ONLY Blood Culture adequate volume   Culture  Setup Time   Final    GRAM NEGATIVE RODS AEROBIC BOTTLE ONLY CRITICAL RESULT CALLED TO, READ BACK BY AND VERIFIED WITH: T MAKHLOUF,PHARMD AT [id number] 01/03/18 BY L BENFIELD Performed at [facility], [address] [place], [place] [address]    Culture GRAM NEGATIVE RODS  Final   Report Status PENDING  Incomplete  Blood Culture ID Panel (Reflexed)     Status: Abnormal   Collection Time: 01/02/18  7:36 PM  Result  Value Ref Range Status   Enterococcus species NOT DETECTED NOT DETECTED Final   Listeria monocytogenes NOT  DETECTED NOT DETECTED Final   Staphylococcus species NOT DETECTED NOT DETECTED Final   Staphylococcus aureus NOT DETECTED NOT DETECTED Final   Streptococcus species NOT DETECTED NOT DETECTED Final   Streptococcus agalactiae NOT DETECTED NOT DETECTED Final   Streptococcus pneumoniae NOT DETECTED NOT DETECTED Final   Streptococcus pyogenes NOT DETECTED NOT DETECTED Final   Acinetobacter baumannii NOT DETECTED NOT DETECTED Final   Enterobacteriaceae species DETECTED (A) NOT DETECTED Final    Comment: Enterobacteriaceae represent a large family of gram-negative bacteria, not a single organism. CRITICAL RESULT CALLED TO, READ BACK BY AND VERIFIED WITH: T MAKHLOUF,PHARMD AT 0981 01/03/18 BY L BENFIELD    Enterobacter cloacae complex NOT DETECTED NOT DETECTED Final   Escherichia coli DETECTED (A) NOT DETECTED Final    Comment: CRITICAL RESULT CALLED TO, READ BACK BY AND VERIFIED WITH: T MAKHLOUF,PHARMD AT 1914 01/03/18 BY L BENFIELD    Klebsiella oxytoca NOT DETECTED NOT DETECTED Final   Klebsiella pneumoniae NOT DETECTED NOT DETECTED Final   Proteus species NOT DETECTED NOT DETECTED Final   Serratia marcescens NOT DETECTED NOT DETECTED Final   Carbapenem resistance NOT DETECTED NOT DETECTED Final   Haemophilus influenzae NOT DETECTED NOT DETECTED Final   Neisseria meningitidis NOT DETECTED NOT DETECTED Final   Pseudomonas aeruginosa NOT DETECTED NOT DETECTED Final   Candida albicans NOT DETECTED NOT DETECTED Final   Candida glabrata NOT DETECTED NOT DETECTED Final   Candida krusei NOT DETECTED NOT DETECTED Final   Candida parapsilosis NOT DETECTED NOT DETECTED Final   Candida tropicalis NOT DETECTED NOT DETECTED Final    Comment: Performed at [facility], [address], [place], [place] [address]    Radiology Reports Dg Chest 2 View  Result Date: 01/02/2018 CLINICAL DATA:   Fever.  Altered mental status. EXAM: CHEST - 2 VIEW COMPARISON:  Chest radiograph 09/23/2009, CT 09/16/2009 FINDINGS: Lower lung volumes from prior exam. The heart is enlarged. Diffuse aortic tortuosity including prominence of the aortic arch. Diffusely increased interstitial opacities which may be pulmonary edema, atypical infection or bronchovascular crowding. Patchy retrocardiac and peripheral basilar opacities. Possible tiny pleural effusions. No pneumothorax. IMPRESSION: 1. Cardiomegaly with tortuous atherosclerotic aorta. Aortic arch tortuosity is more prominent than prior exam, likely secondary to differences in technique. Recommend follow-up PA and lateral views when patient is able to take better inspiration. 2. Diffusely increased interstitial opacities may represent pulmonary edema, atypical infection, or less likely bronchovascular crowding secondary to low lung volumes. 3. Patchy retrocardiac and peripheral bibasilar opacities, pneumonia or aspiration is considered in the setting of fever. Electronically Signed   By: Rubye Oaks M.D.   On: 01/02/2018 21:25   Ct Head Wo Contrast  Result Date: 01/02/2018 CLINICAL DATA:  82 year old female with altered mental status. EXAM: CT HEAD WITHOUT CONTRAST TECHNIQUE: Contiguous axial images were obtained from the base of the skull through the vertex without intravenous contrast. COMPARISON:  05/17/2015 and prior CTs FINDINGS: Brain: No evidence of acute infarction, hemorrhage, hydrocephalus, extra-axial collection or mass lesion/mass effect. Atrophy and chronic small-vessel white matter ischemic changes noted. Vascular: Intracranial atherosclerotic calcifications noted periods Skull: Normal. Negative for fracture or focal lesion. Sinuses/Orbits: No acute finding. Chronic RIGHT maxillary sinus mucoperiosteal thickening noted. Other: None. IMPRESSION: 1. No evidence of acute intracranial abnormality 2. Atrophy and chronic small-vessel white matter ischemic  changes. Electronically Signed   By: Harmon Pier M.D.   On: 01/02/2018 19:20    Lab Data:  CBC: Recent Labs  Lab 01/02/18 1638 01/03/18  1610  WBC 22.9* 23.4*  HGB 12.8 11.0*  HCT 41.6 36.1  MCV 94.5 93.5  PLT 245 167   Basic Metabolic Panel: Recent Labs  Lab 01/02/18 1638 01/03/18 0712  NA 140 144  K 3.5 3.4*  CL 107 113*  CO2 21* 22  GLUCOSE 140* 98  BUN 18 16  CREATININE 1.65* 1.46*  CALCIUM 8.6* 7.9*   GFR: Estimated Creatinine Clearance: 29.9 mL/min (A) (by C-G formula based on SCr of 1.46 mg/dL (H)). Liver Function Tests: Recent Labs  Lab 01/02/18 1638  AST 26  ALT 12  ALKPHOS 75  BILITOT 1.0  PROT 7.7  ALBUMIN 3.0*   No results for input(s): LIPASE, AMYLASE in the last 168 hours. No results for input(s): AMMONIA in the last 168 hours. Coagulation Profile: No results for input(s): INR, PROTIME in the last 168 hours. Cardiac Enzymes: No results for input(s): CKTOTAL, CKMB, CKMBINDEX, TROPONINI in the last 168 hours. BNP (last 3 results) No results for input(s): PROBNP in the last 8760 hours. HbA1C: No results for input(s): HGBA1C in the last 72 hours. CBG: Recent Labs  Lab 01/02/18 1721  GLUCAP 129*   Lipid Profile: No results for input(s): CHOL, HDL, LDLCALC, TRIG, CHOLHDL, LDLDIRECT in the last 72 hours. Thyroid Function Tests: No results for input(s): TSH, T4TOTAL, FREET4, T3FREE, THYROIDAB in the last 72 hours. Anemia Panel: No results for input(s): VITAMINB12, FOLATE, FERRITIN, TIBC, IRON, RETICCTPCT in the last 72 hours. Urine analysis:    Component Value Date/Time   COLORURINE YELLOW 01/02/2018 1751   APPEARANCEUR CLOUDY (A) 01/02/2018 1751   APPEARANCEUR Cloudy (A) 06/01/2015 1225   LABSPEC 1.016 01/02/2018 1751   PHURINE 5.0 01/02/2018 1751   GLUCOSEU NEGATIVE 01/02/2018 1751   HGBUR SMALL (A) 01/02/2018 1751   BILIRUBINUR NEGATIVE 01/02/2018 1751   BILIRUBINUR Neg 11/11/2017 1500   BILIRUBINUR Negative 06/01/2015 1225    KETONESUR NEGATIVE 01/02/2018 1751   PROTEINUR 100 (A) 01/02/2018 1751   UROBILINOGEN 0.2 11/11/2017 1500   UROBILINOGEN 0.2 10/26/2010 1137   NITRITE NEGATIVE 01/02/2018 1751   LEUKOCYTESUR LARGE (A) 01/02/2018 1751   LEUKOCYTESUR Trace (A) 06/01/2015 1225     Jackie Plum M.D. [facility] 01/03/2018, 11:13 AM  Pager: [phone] Between 7am to 7pm - call Pager - [phone]  After 7pm go to [email or web address] - password [email or web address]  Call night coverage person covering after 7pm

## 2018-01-03 NOTE — Plan of Care (Signed)
  Problem: Nutrition: Goal: Adequate nutrition will be maintained Outcome: Not Progressing   Problem: Clinical Measurements: Goal: Ability to maintain clinical measurements within normal limits will improve Outcome: Not Progressing   Problem: Health Behavior/Discharge Planning: Goal: Ability to manage health-related needs will improve Outcome: Not Progressing

## 2018-01-03 NOTE — Progress Notes (Addendum)
PHARMACY - PHYSICIAN COMMUNICATION CRITICAL VALUE ALERT - BLOOD CULTURE IDENTIFICATION (BCID)  Carol [name] is an 82 y.o. female who presented to [facility] 01[facility]02/2018 with a chief complaint of AMS and fever  Assessment:  Pt found to have UTI now with blood cultures growing GNR with BCID showing E.coli.   Name of physician (or Provider) Contacted: Osei-Bonsu  Current antibiotics: Ceftriaxone 1g daily  Changes to prescribed antibiotics recommended:  Will d/c Vancomycin and increase Rocephin to 2g IV every 24 hours  Results for orders placed or performed during the hospital encounter of 01/02/18  Blood Culture ID Panel (Reflexed) (Collected: 01/02/2018  7:36 PM)  Result Value Ref Range   Enterococcus species NOT DETECTED NOT DETECTED   Listeria monocytogenes NOT DETECTED NOT DETECTED   Staphylococcus species NOT DETECTED NOT DETECTED   Staphylococcus aureus NOT DETECTED NOT DETECTED   Streptococcus species NOT DETECTED NOT DETECTED   Streptococcus agalactiae NOT DETECTED NOT DETECTED   Streptococcus pneumoniae NOT DETECTED NOT DETECTED   Streptococcus pyogenes NOT DETECTED NOT DETECTED   Acinetobacter baumannii NOT DETECTED NOT DETECTED   Enterobacteriaceae species DETECTED (A) NOT DETECTED   Enterobacter cloacae complex NOT DETECTED NOT DETECTED   Escherichia coli DETECTED (A) NOT DETECTED   Klebsiella oxytoca NOT DETECTED NOT DETECTED   Klebsiella pneumoniae NOT DETECTED NOT DETECTED   Proteus species NOT DETECTED NOT DETECTED   Serratia marcescens NOT DETECTED NOT DETECTED   Carbapenem resistance NOT DETECTED NOT DETECTED   Haemophilus influenzae NOT DETECTED NOT DETECTED   Neisseria meningitidis NOT DETECTED NOT DETECTED   Pseudomonas aeruginosa NOT DETECTED NOT DETECTED   Candida albicans NOT DETECTED NOT DETECTED   Candida glabrata NOT DETECTED NOT DETECTED   Candida krusei NOT DETECTED NOT DETECTED   Candida parapsilosis NOT DETECTED NOT DETECTED   Candida  tropicalis NOT DETECTED NOT DETECTED    Georgina PillionElizabeth Moss Berry, PharmD, BCPS Pager: [phone] 10:45 AM

## 2018-01-04 DIAGNOSIS — F0150 Vascular dementia without behavioral disturbance: Secondary | ICD-10-CM

## 2018-01-04 DIAGNOSIS — F0280 Dementia in other diseases classified elsewhere without behavioral disturbance: Secondary | ICD-10-CM

## 2018-01-04 DIAGNOSIS — R7881 Bacteremia: Secondary | ICD-10-CM

## 2018-01-04 DIAGNOSIS — A4151 Sepsis due to Escherichia coli [E. coli]: Principal | ICD-10-CM

## 2018-01-04 DIAGNOSIS — E785 Hyperlipidemia, unspecified: Secondary | ICD-10-CM

## 2018-01-04 DIAGNOSIS — G309 Alzheimer's disease, unspecified: Secondary | ICD-10-CM

## 2018-01-04 LAB — COMPREHENSIVE METABOLIC PANEL
ALT: 14 U/L (ref 0–44)
AST: 33 U/L (ref 15–41)
Albumin: 2.3 g/dL — ABNORMAL LOW (ref 3.5–5.0)
Alkaline Phosphatase: 69 U/L (ref 38–126)
Anion gap: 10 (ref 5–15)
BILIRUBIN TOTAL: 0.9 mg/dL (ref 0.3–1.2)
BUN: 10 mg/dL (ref 8–23)
CHLORIDE: 112 mmol/L — AB (ref 98–111)
CO2: 21 mmol/L — ABNORMAL LOW (ref 22–32)
Calcium: 8.3 mg/dL — ABNORMAL LOW (ref 8.9–10.3)
Creatinine, Ser: 1.27 mg/dL — ABNORMAL HIGH (ref 0.44–1.00)
GFR calc Af Amer: 45 mL/min — ABNORMAL LOW (ref >60)
GFR calc non Af Amer: 39 mL/min — ABNORMAL LOW (ref >60)
Glucose, Bld: 125 mg/dL — ABNORMAL HIGH (ref 70–99)
POTASSIUM: 3.4 mmol/L — AB (ref 3.5–5.1)
Sodium: 143 mmol/L (ref 135–145)
TOTAL PROTEIN: 6.4 g/dL — AB (ref 6.5–8.1)

## 2018-01-04 LAB — CBC
HEMATOCRIT: 34.5 % — AB (ref 36.0–46.0)
Hemoglobin: 10.9 g/dL — ABNORMAL LOW (ref 12.0–15.0)
MCH: 29 pg (ref 26.0–34.0)
MCHC: 31.6 g/dL (ref 30.0–36.0)
MCV: 91.8 fL (ref 78.0–100.0)
Platelets: 168 10*3/uL (ref 150–400)
RBC: 3.76 MIL/uL — ABNORMAL LOW (ref 3.87–5.11)
RDW: 14.2 % (ref 11.5–15.5)
WBC: 12.9 10*3/uL — AB (ref 4.0–10.5)

## 2018-01-04 MED ORDER — ENOXAPARIN SODIUM 40 MG/0.4ML ~~LOC~~ SOLN
40.0000 mg | SUBCUTANEOUS | Status: DC
  Administered 2018-01-04: 40 mg via SUBCUTANEOUS
  Filled 2018-01-04: qty 0.4

## 2018-01-04 MED ORDER — SODIUM CHLORIDE 0.9 % IV SOLN
2.0000 g | INTRAVENOUS | Status: DC
  Administered 2018-01-04: 2 g via INTRAVENOUS
  Filled 2018-01-04 (×4): qty 20

## 2018-01-04 MED ORDER — ATORVASTATIN CALCIUM 20 MG PO TABS
20.0000 mg | ORAL_TABLET | Freq: Every day | ORAL | Status: DC
  Administered 2018-01-04 – 2018-01-07 (×3): 20 mg via ORAL
  Filled 2018-01-04 (×3): qty 1

## 2018-01-04 NOTE — Progress Notes (Signed)
PROGRESS NOTE  Carol [name]  UJW:[id number] DOB: 22-Dec-1935 DOA: 01/02/2018 PCP: Kirt Boys, DO   Brief Narrative: Carol [name] is an 82 y.o. female with a history of dementia and HTN who presented from home with AMS (incontinence, sluggishness, decreased po intake) and fever. Work up revealed possibly bibasilar infiltrates on CXR and urinalysis highly suggestive of infection. She was tachycardic with leukocytosis to 22.9k and AKI with creatinine of 1.65. Head CT negative. Vancomycin and cefepime were started and patient admitted. Urine and 1 of 4 blood culture bottles grew E. coli, so antibiotics narrowed to ceftriaxone pending susceptibilities. Leukocytosis has improved, fever curve improving and creatinine has declined.   Assessment & Plan: Principal Problem:   UTI (urinary tract infection) Active Problems:   Mixed Alzheimer's and vascular dementia   Hyperlipidemia   AKI (acute kidney injury) (HCC)   Acute metabolic encephalopathy   Sepsis (HCC)  Sepsis due to E. coli bacteremia from urinary source:  - Continue ceftriaxone pending susceptibilities (Note PCN allergy, though has been tolerating cephalosporins). No indication to recheck cultures for clearance with rapid improvement. Will plan on 10-14 day of antibiotics.  - Suspect patchy bibasilar opacities on CXR more likely due to atelectasis, so will continue IS and OOB as able but no directed Tx for PNA. SLP evaluator felt pt was at mild risk for aspiration mostly due to mentation.  - Trend leukocytosis. Lactic acid has cleared.  Acute metabolic encephalopathy on chronic dementia: Due to sepsis/bacteremia. Improving - Delirium precautions - Continue home aricept, namenda, rivastigmine and seroquel - Treat with abx as above  AKI: Due to sepsis. Improving.  - Continue monitoring on IV fluids. Creatinine baseline is 0.9.  Mild hypokalemia:  - Replace and recheck in AM with magnesium. Push PO.  HTN: Currently  low-normotensive.  - Continue holding norvasc with sepsis resolving  Hyperlipidemia:  - Continue home statin  DVT prophylaxis: Lovenox 30mg  q24h  Code Status: Full Family Communication:  None at bedside this AM. Spoke with daughter by phone. Disposition Plan: Once markers show definitive and sustained improvement, can transition to po antibiotics. Will get PT/OT evaluations.   Consultants:   None  Procedures:   None  Antimicrobials:  Vancomycin, cefepime 7/27-28  Ceftriaxone 7/28 >   Subjective: No complaints. Had an uneventful night. Has not gotten up. No fever, chills, abd pain, dysuria, cough, dyspnea or chest pain.  Objective: Vitals:   01/03/18 0147 01/03/18 0500 01/03/18 1443 01/03/18 2144  BP: 102/65 118/75 (!) 146/73 127/80  Pulse: 90 82 90 (!) 103  Resp: 18 16 19    Temp: 98.8 F (37.1 C) 98.3 F (36.8 C) 99.9 F (37.7 C) 99 F (37.2 C)  TempSrc: Oral Oral Oral Oral  SpO2: 95% 97% 92% 98%  Weight:      Height:        Intake/Output Summary (Last 24 hours) at 01/04/2018 1011 Last data filed at 01/04/2018 0900 Gross per 24 hour  Intake 2301.66 ml  Output 3 ml  Net 2298.66 ml   Filed Weights   01/02/18 1817  Weight: 73 kg (161 lb)    Gen: Calm elderly female in no distress Pulm: Non-labored breathing. Clear to auscultation bilaterally.  CV: Regular rate and rhythm. No murmur, rub, or gallop. No JVD, no pedal edema. GI: Abdomen soft, non-tender, non-distended, with normoactive bowel sounds. No organomegaly or masses felt. Ext: Warm, no deformities Skin: No rashes, lesions or ulcers Neuro: Alert and oriented to person and hospital. No focal neurological  deficits. Psych: Mood & affect appropriate.   Data Reviewed: I have personally reviewed following labs and imaging studies  CBC: Recent Labs  Lab 01/02/18 1638 01/03/18 0712 01/04/18 0635  WBC 22.9* 23.4* 12.9*  HGB 12.8 11.0* 10.9*  HCT 41.6 36.1 34.5*  MCV 94.5 93.5 91.8  PLT 245 167 168     Basic Metabolic Panel: Recent Labs  Lab 01/02/18 1638 01/03/18 0712 01/04/18 0635  NA 140 144 143  K 3.5 3.4* 3.4*  CL 107 113* 112*  CO2 21* 22 21*  GLUCOSE 140* 98 125*  BUN 18 16 10   CREATININE 1.65* 1.46* 1.27*  CALCIUM 8.6* 7.9* 8.3*   GFR: Estimated Creatinine Clearance: 34.4 mL/min (A) (by C-G formula based on SCr of 1.27 mg/dL (H)). Liver Function Tests: Recent Labs  Lab 01/02/18 1638 01/04/18 0635  AST 26 33  ALT 12 14  ALKPHOS 75 69  BILITOT 1.0 0.9  PROT 7.7 6.4*  ALBUMIN 3.0* 2.3*   No results for input(s): LIPASE, AMYLASE in the last 168 hours. No results for input(s): AMMONIA in the last 168 hours. Coagulation Profile: No results for input(s): INR, PROTIME in the last 168 hours. Cardiac Enzymes: No results for input(s): CKTOTAL, CKMB, CKMBINDEX, TROPONINI in the last 168 hours. BNP (last 3 results) No results for input(s): PROBNP in the last 8760 hours. HbA1C: No results for input(s): HGBA1C in the last 72 hours. CBG: Recent Labs  Lab 01/02/18 1721  GLUCAP 129*   Lipid Profile: No results for input(s): CHOL, HDL, LDLCALC, TRIG, CHOLHDL, LDLDIRECT in the last 72 hours. Thyroid Function Tests: No results for input(s): TSH, T4TOTAL, FREET4, T3FREE, THYROIDAB in the last 72 hours. Anemia Panel: No results for input(s): VITAMINB12, FOLATE, FERRITIN, TIBC, IRON, RETICCTPCT in the last 72 hours. Urine analysis:    Component Value Date/Time   COLORURINE YELLOW 01/02/2018 1751   APPEARANCEUR CLOUDY (A) 01/02/2018 1751   APPEARANCEUR Cloudy (A) 06/01/2015 1225   LABSPEC 1.016 01/02/2018 1751   PHURINE 5.0 01/02/2018 1751   GLUCOSEU NEGATIVE 01/02/2018 1751   HGBUR SMALL (A) 01/02/2018 1751   BILIRUBINUR NEGATIVE 01/02/2018 1751   BILIRUBINUR Neg 11/11/2017 1500   BILIRUBINUR Negative 06/01/2015 1225   KETONESUR NEGATIVE 01/02/2018 1751   PROTEINUR 100 (A) 01/02/2018 1751   UROBILINOGEN 0.2 11/11/2017 1500   UROBILINOGEN 0.2 10/26/2010 1137    NITRITE NEGATIVE 01/02/2018 1751   LEUKOCYTESUR LARGE (A) 01/02/2018 1751   LEUKOCYTESUR Trace (A) 06/01/2015 1225   Recent Results (from the past 240 hour(s))  Urine Culture     Status: Abnormal (Preliminary result)   Collection Time: 01/02/18  5:53 PM  Result Value Ref Range Status   Specimen Description URINE, RANDOM  Final   Special Requests   Final    NONE Performed at [facility], [address], [place], [place] [address]    Culture >=100,000 COLONIES/mL ESCHERICHIA COLI (A)  Final   Report Status PENDING  Incomplete  Blood Culture (routine x 2)     Status: None (Preliminary result)   Collection Time: 01/02/18  6:45 PM  Result Value Ref Range Status   Specimen Description BLOOD LEFT HAND  Final   Special Requests   Final    BOTTLES DRAWN AEROBIC AND ANAEROBIC Blood Culture results may not be optimal due to an inadequate volume of blood received in culture bottles   Culture   Final    NO GROWTH < 24 HOURS Performed at [facility], [address] [place], [place] [address]    Report Status PENDING  Incomplete  Blood Culture (routine x 2)     Status: Abnormal (Preliminary result)   Collection Time: 01/02/18  7:36 PM  Result Value Ref Range Status   Specimen Description BLOOD RIGHT HAND  Final   Special Requests   Final    BOTTLES DRAWN AEROBIC ONLY Blood Culture adequate volume   Culture  Setup Time   Final    GRAM NEGATIVE RODS AEROBIC BOTTLE ONLY CRITICAL RESULT CALLED TO, READ BACK BY AND VERIFIED WITH: T MAKHLOUF,PHARMD AT 6045 01/03/18 BY L BENFIELD Performed at [facility], [address] [place], [place] [address]    Culture ESCHERICHIA COLI (A)  Final   Report Status PENDING  Incomplete  Blood Culture ID Panel (Reflexed)     Status: Abnormal   Collection Time: 01/02/18  7:36 PM  Result Value Ref Range Status   Enterococcus species NOT DETECTED NOT DETECTED Final   Listeria monocytogenes NOT DETECTED NOT DETECTED Final   Staphylococcus  species NOT DETECTED NOT DETECTED Final   Staphylococcus aureus NOT DETECTED NOT DETECTED Final   Streptococcus species NOT DETECTED NOT DETECTED Final   Streptococcus agalactiae NOT DETECTED NOT DETECTED Final   Streptococcus pneumoniae NOT DETECTED NOT DETECTED Final   Streptococcus pyogenes NOT DETECTED NOT DETECTED Final   Acinetobacter baumannii NOT DETECTED NOT DETECTED Final   Enterobacteriaceae species DETECTED (A) NOT DETECTED Final    Comment: Enterobacteriaceae represent a large family of gram-negative bacteria, not a single organism. CRITICAL RESULT CALLED TO, READ BACK BY AND VERIFIED WITH: T MAKHLOUF,PHARMD AT 1914 01/03/18 BY L BENFIELD    Enterobacter cloacae complex NOT DETECTED NOT DETECTED Final   Escherichia coli DETECTED (A) NOT DETECTED Final    Comment: CRITICAL RESULT CALLED TO, READ BACK BY AND VERIFIED WITH: T MAKHLOUF,PHARMD AT 7829 01/03/18 BY L BENFIELD    Klebsiella oxytoca NOT DETECTED NOT DETECTED Final   Klebsiella pneumoniae NOT DETECTED NOT DETECTED Final   Proteus species NOT DETECTED NOT DETECTED Final   Serratia marcescens NOT DETECTED NOT DETECTED Final   Carbapenem resistance NOT DETECTED NOT DETECTED Final   Haemophilus influenzae NOT DETECTED NOT DETECTED Final   Neisseria meningitidis NOT DETECTED NOT DETECTED Final   Pseudomonas aeruginosa NOT DETECTED NOT DETECTED Final   Candida albicans NOT DETECTED NOT DETECTED Final   Candida glabrata NOT DETECTED NOT DETECTED Final   Candida krusei NOT DETECTED NOT DETECTED Final   Candida parapsilosis NOT DETECTED NOT DETECTED Final   Candida tropicalis NOT DETECTED NOT DETECTED Final    Comment: Performed at [facility], [address], [place], [place] [address]      Radiology Studies: Dg Chest 2 View  Result Date: 01/02/2018 CLINICAL DATA:  Fever.  Altered mental status. EXAM: CHEST - 2 VIEW COMPARISON:  Chest radiograph 09/23/2009, CT 09/16/2009 FINDINGS: Lower lung volumes from prior  exam. The heart is enlarged. Diffuse aortic tortuosity including prominence of the aortic arch. Diffusely increased interstitial opacities which may be pulmonary edema, atypical infection or bronchovascular crowding. Patchy retrocardiac and peripheral basilar opacities. Possible tiny pleural effusions. No pneumothorax. IMPRESSION: 1. Cardiomegaly with tortuous atherosclerotic aorta. Aortic arch tortuosity is more prominent than prior exam, likely secondary to differences in technique. Recommend follow-up PA and lateral views when patient is able to take better inspiration. 2. Diffusely increased interstitial opacities may represent pulmonary edema, atypical infection, or less likely bronchovascular crowding secondary to low lung volumes. 3.  Patchy retrocardiac and peripheral bibasilar opacities, pneumonia or aspiration is considered in the setting of fever. Electronically Signed   By: Rubye OaksMelanie  Ehinger M.D.   On: 01/02/2018 21:25   Ct Head Wo Contrast  Result Date: 01/02/2018 CLINICAL DATA:  82 year old female with altered mental status. EXAM: CT HEAD WITHOUT CONTRAST TECHNIQUE: Contiguous axial images were obtained from the base of the skull through the vertex without intravenous contrast. COMPARISON:  05/17/2015 and prior CTs FINDINGS: Brain: No evidence of acute infarction, hemorrhage, hydrocephalus, extra-axial collection or mass lesion/mass effect. Atrophy and chronic small-vessel white matter ischemic changes noted. Vascular: Intracranial atherosclerotic calcifications noted periods Skull: Normal. Negative for fracture or focal lesion. Sinuses/Orbits: No acute finding. Chronic RIGHT maxillary sinus mucoperiosteal thickening noted. Other: None. IMPRESSION: 1. No evidence of acute intracranial abnormality 2. Atrophy and chronic small-vessel white matter ischemic changes. Electronically Signed   By: Harmon PierJeffrey  Hu M.D.   On: 01/02/2018 19:20    Scheduled Meds: . memantine  28 mg Oral Q supper   And  .  donepezil  10 mg Oral Q supper  . enoxaparin (LOVENOX) injection  30 mg Subcutaneous Q24H  . nystatin   Topical BID  . potassium chloride  20 mEq Oral BID  . QUEtiapine  100 mg Oral QHS  . rivastigmine  13.3 mg Transdermal QPC supper   Continuous Infusions: . sodium chloride 75 mL/hr at 01/04/18 0237  . cefTRIAXone (ROCEPHIN)  IV Stopped (01/03/18 1845)     LOS: 2 days   Time spent: 25 minutes.  Tyrone Nineyan B Michal Strzelecki, MD [facility] [email or web address] Password [email or web address] 01/04/2018, 10:11 AM

## 2018-01-04 NOTE — Progress Notes (Signed)
  Speech Language Pathology Treatment: Dysphagia  Patient Details Name: Carol [name] MRN: [id number] DOB: 07-10-1935 Today's Date: 01/04/2018 Time: 6484-7207 SLP Time Calculation (min) (ACUTE ONLY): 10 min  Assessment / Plan / Recommendation Clinical Impression  Pt seen at bedside for follow up after BSE completed Sunday 01/03/18. Poor intake of lunch meal noted, however no overt s/s aspiration observed or reported. ST will sign off at this time. Please reconsult if needs arise.    HPI HPI: Pt is an 82 y.o. female with medical history significant of hypertension, hyperlipidemia, depression, dementia, former smoker, varicose vein, UTI (positiver urine culture for MRSA 06/01/15), who presents with AMS and fever. Admitted for sepsis from UTI, although CXR is also concerning for possible PNA in the setting of fevers.       SLP Plan  DC ST - All goals met       Recommendations  Diet recommendations: Regular;Thin liquid Liquids provided via: Cup;Straw Medication Administration: Whole meds with liquid Supervision: Patient able to self feed Compensations: Slow rate;Small sips/bites;Minimize environmental distractions Postural Changes and/or Swallow Maneuvers: Out of bed for meals;Seated upright 90 degrees                Oral Care Recommendations: Oral care BID Follow up Recommendations: 24 hour supervision/assistance SLP Visit Diagnosis: Dysphagia, unspecified (R13.10) Plan: All goals met       GO               Taydon Nasworthy B. [facility], CCC-SLP Speech Language Pathologist [phone]  Shonna Chock 01/04/2018, 2:32 PM

## 2018-01-05 LAB — CBC
HCT: 38.1 % (ref 36.0–46.0)
Hemoglobin: 11.9 g/dL — ABNORMAL LOW (ref 12.0–15.0)
MCH: 28.5 pg (ref 26.0–34.0)
MCHC: 31.2 g/dL (ref 30.0–36.0)
MCV: 91.4 fL (ref 78.0–100.0)
Platelets: 195 10*3/uL (ref 150–400)
RBC: 4.17 MIL/uL (ref 3.87–5.11)
RDW: 14.4 % (ref 11.5–15.5)
WBC: 10.5 10*3/uL (ref 4.0–10.5)

## 2018-01-05 LAB — CULTURE, BLOOD (ROUTINE X 2): Special Requests: ADEQUATE

## 2018-01-05 LAB — BASIC METABOLIC PANEL
ANION GAP: 9 (ref 5–15)
BUN: 6 mg/dL — ABNORMAL LOW (ref 8–23)
CO2: 21 mmol/L — AB (ref 22–32)
Calcium: 8.5 mg/dL — ABNORMAL LOW (ref 8.9–10.3)
Chloride: 113 mmol/L — ABNORMAL HIGH (ref 98–111)
Creatinine, Ser: 1.07 mg/dL — ABNORMAL HIGH (ref 0.44–1.00)
GFR calc non Af Amer: 48 mL/min — ABNORMAL LOW (ref >60)
GFR, EST AFRICAN AMERICAN: 55 mL/min — AB (ref >60)
GLUCOSE: 106 mg/dL — AB (ref 70–99)
Potassium: 3.5 mmol/L (ref 3.5–5.1)
Sodium: 143 mmol/L (ref 135–145)

## 2018-01-05 LAB — MAGNESIUM: Magnesium: 1.5 mg/dL — ABNORMAL LOW (ref 1.7–2.4)

## 2018-01-05 MED ORDER — HEPARIN SODIUM (PORCINE) 5000 UNIT/ML IJ SOLN
5000.0000 [IU] | Freq: Three times a day (TID) | INTRAMUSCULAR | Status: DC
  Administered 2018-01-05 – 2018-01-07 (×5): 5000 [IU] via SUBCUTANEOUS
  Filled 2018-01-05 (×5): qty 1

## 2018-01-05 MED ORDER — MAGNESIUM SULFATE 2 GM/50ML IV SOLN
2.0000 g | Freq: Once | INTRAVENOUS | Status: AC
  Administered 2018-01-05: 2 g via INTRAVENOUS
  Filled 2018-01-05: qty 50

## 2018-01-05 MED ORDER — POTASSIUM CHLORIDE CRYS ER 20 MEQ PO TBCR
20.0000 meq | EXTENDED_RELEASE_TABLET | Freq: Two times a day (BID) | ORAL | Status: DC
  Administered 2018-01-05: 20 meq via ORAL
  Filled 2018-01-05: qty 1

## 2018-01-05 NOTE — Evaluation (Signed)
Physical Therapy Evaluation Patient Details Name: MAKYNLEE [name] MRN: [id number] DOB: 03-11-1936 Today's Date: 01/05/2018   History of Present Illness  Pt is an 82 y.o. female with medical history significant of hypertension, hyperlipidemia, depression, dementia, former smoker, varicose vein, UTI (positiver urine culture for MRSA 06/01/15), who presents with AMS and fever. Admitted for sepsis from UTI, although CXR is also concerning for possible PNA in the setting of fevers. .   Clinical Impression  [name] admitted with the above listed diagnosis. Per chart review, patient lived at home with her husband, as well as having assistance from children and HH aide - was independent with mobility without AD. Patient today requiring general min guard for all mobility with patient demonstrating reduced initiation requiring education/motivation to complete tasks as well as mild instability with gait requiring min guard and verbal cueing for safety and obstacle navigation. Will recommend HHPT at discharge to continue to progress safe functional mobility within the home environment. PT to continue to follow acutely to maximize safety, balance, strength, and functional mobility prior to the d/c venue listed below.    Follow Up Recommendations Home health PT;Supervision/Assistance - 24 hour    Equipment Recommendations  None recommended by PT    Recommendations for Other Services OT consult     Precautions / Restrictions Precautions Precautions: Fall Restrictions Weight Bearing Restrictions: No      Mobility  Bed Mobility Overal bed mobility: Needs Assistance Bed Mobility: Rolling;Sidelying to Sit Rolling: Min guard Sidelying to sit: Min guard       General bed mobility comments: up in recliner upon PT arrival  Transfers Overall transfer level: Needs assistance Equipment used: None Transfers: Sit to/from Stand Sit to Stand: Min guard         General transfer comment:  min guard for general safety and immediate standing balance  Ambulation/Gait Ambulation/Gait assistance: Min guard Gait Distance (Feet): 90 Feet Assistive device: None Gait Pattern/deviations: Step-through pattern;Decreased stride length;Drifts right/left Gait velocity: decreased   General Gait Details: requires verbal cueing for safe hallway/obstacle navigation; drifts R/L with mild instability during gait; cueing for safety throughout  Stairs            Wheelchair Mobility    Modified Rankin (Stroke Patients Only)       Balance Overall balance assessment: Mild deficits observed, not formally tested                                           Pertinent Vitals/Pain Pain Assessment: No/denies pain    Home Living Family/patient expects to be discharged to:: Private residence Living Arrangements: Children Available Help at Discharge: Family;Available 24 hours/day Type of Home: House Home Access: Stairs to enter Entrance Stairs-Rails: Right;Left;Can reach both Entrance Stairs-Number of Steps: 3 Home Layout: One level Home Equipment: Shower seat - built in Additional Comments: all per OT note ojn this date: history per pt report and verified by pt's daughter, Carol [name];[name] in [place] is family preference, closest to oldest daughter who does not work;pt lives with husband who is at [place] receiving care, husband is unable to provide physical assistance; all information given to PT during session conflicts with OT note - will use OT note as correct as it was verified by patients daughter    Prior Function Level of Independence: Needs assistance   Gait / Transfers Assistance Needed: independent with ambulation;had assistance from  HHaide for shower transfers  ADL's / Homemaking Assistance Needed: pt had meals on wheels;HHaide 2x/wk to assist with full bathing, family assisted with sponge baths PRN;family assisted with medication management and  driving  Comments: pt assistance 24/7, 1 daughter with her during the day, other 3 daughters have night time rotation     Hand Dominance   Dominant Hand: Right    Extremity/Trunk Assessment   Upper Extremity Assessment Upper Extremity Assessment: Defer to OT evaluation    Lower Extremity Assessment Lower Extremity Assessment: Generalized weakness    Cervical / Trunk Assessment Cervical / Trunk Assessment: Normal  Communication   Communication: Other (comment)(reduced STM)  Cognition Arousal/Alertness: Awake/alert Behavior During Therapy: Restless;Agitated Overall Cognitive Status: Impaired/Different from baseline Area of Impairment: Memory;Following commands;Safety/judgement;Problem solving                 Orientation Level: Disoriented to;Time;Situation   Memory: Decreased short-term memory Following Commands: Follows one step commands inconsistently;Follows one step commands with increased time Safety/Judgement: Decreased awareness of deficits;Decreased awareness of safety Awareness: Intellectual Problem Solving: Slow processing;Decreased initiation;Difficulty sequencing;Requires verbal cues;Requires tactile cues General Comments: Patient agitated that she is in the hospital; decreased initiation with all activity; requires cueing and encouragement for activities that patient is able to do independently; at one time states "I don't know why I have to do all this when I'm paying you."      General Comments General comments (skin integrity, edema, etc.): PLOF gleaned from OT note/session this AM - patient providing conflicting information to PT; agitated at times that PT was requesting to mobilize to maintain functional status.    Exercises     Assessment/Plan    PT Assessment Patient needs continued PT services  PT Problem List Decreased strength;Decreased activity tolerance;Decreased balance;Decreased mobility;Decreased knowledge of use of DME;Decreased safety  awareness       PT Treatment Interventions DME instruction;Gait training;Functional mobility training;Therapeutic activities;Therapeutic exercise;Balance training;Neuromuscular re-education;Patient/family education;Stair training    PT Goals (Current goals can be found in the Care Plan section)  Acute Rehab PT Goals Patient Stated Goal: to go home PT Goal Formulation: With patient Time For Goal Achievement: 01/19/18 Potential to Achieve Goals: Good    Frequency Min 3X/week   Barriers to discharge        Co-evaluation               AM-PAC PT "6 Clicks" Daily Activity  Outcome Measure Difficulty turning over in bed (including adjusting bedclothes, sheets and blankets)?: A Little Difficulty moving from lying on back to sitting on the side of the bed? : A Little Difficulty sitting down on and standing up from a chair with arms (e.g., wheelchair, bedside commode, etc,.)?: Unable Help needed moving to and from a bed to chair (including a wheelchair)?: A Little Help needed walking in hospital room?: A Little Help needed climbing 3-5 steps with a railing? : A Lot 6 Click Score: 15    End of Session Equipment Utilized During Treatment: Gait belt Activity Tolerance: Patient tolerated treatment well Patient left: in chair;with call bell/phone within reach;with chair alarm set Nurse Communication: Mobility status PT Visit Diagnosis: Unsteadiness on feet (R26.81);Other abnormalities of gait and mobility (R26.89);Muscle weakness (generalized) (M62.81)    Time: [id number] PT Time Calculation (min) (ACUTE ONLY): 18 min   Charges:   PT Evaluation $PT Eval Moderate Complexity: 1 Mod          Kipp LaurenceStephanie R Lorely Bubb, PT, DPT 01/05/18 12:22 PM Pager: [phone]

## 2018-01-05 NOTE — Progress Notes (Signed)
OT Note - Addendum    01/05/18 1700  OT Visit Information  Last OT Received On 01/05/18  OT Time Calculation  OT Start Time (ACUTE ONLY) [id number]  OT Stop Time (ACUTE ONLY) 0836  OT Time Calculation (min) 25 min  OT General Charges  $OT Visit 1 Visit  OT Evaluation  $OT Eval Moderate Complexity 1 Mod  OT Treatments  $Self Care/Home Management  8-22 mins  Luisa DagoHilary Bart Ashford, OT/L  OT Clinical Specialist [phone]

## 2018-01-05 NOTE — Progress Notes (Signed)
PROGRESS NOTE  Carol [name]  ZOX:[id number] DOB: 1936-02-18 DOA: 01/02/2018 PCP: Kirt Boys, DO   Brief Narrative: Carol [name] is an 82 y.o. female with a history of dementia and HTN who presented from home with AMS (incontinence, sluggishness, decreased po intake) and fever. Work up revealed possibly bibasilar infiltrates on CXR and urinalysis highly suggestive of infection. She was tachycardic with leukocytosis to 22.9k and AKI with creatinine of 1.65. Head CT negative. Vancomycin and cefepime were started and patient admitted. Urine and 1 of 4 blood culture bottles grew E. coli, so antibiotics narrowed to ceftriaxone pending susceptibilities. Leukocytosis has improved, fever curve improving and creatinine has declined. The patient continues to have some altered mentation from baseline but no focal deficits on exam, still newly incontinent of feces and urine.  Assessment & Plan: Principal Problem:   UTI (urinary tract infection) Active Problems:   Mixed Alzheimer's and vascular dementia   Hyperlipidemia   AKI (acute kidney injury) (HCC)   Acute metabolic encephalopathy   Sepsis (HCC)  Sepsis due to E. coli bacteremia from urinary source:  - Continue ceftriaxone (pansensitivity on Cx). Will plan to transition to po at discharge for total 14 days of therapy.   Acute metabolic encephalopathy on chronic dementia: Due to sepsis/bacteremia. Improving, but not back to mental baseline as she's disoriented more than usual and having urinary and fecal incontinence/unawareness which is completely new.  - Continue abx as above, treating the presumed source of AMS. No new medications were started.  - Delirium precautions - Continue home aricept, namenda, rivastigmine and seroquel  AKI: Due to sepsis. Improving.  - Continue monitoring on IV fluids. Creatinine baseline is 0.9. Remains elevated from baseline.  Mild hypokalemia:  - Replaced, now 3.5 with low magnesium. Will replace  both today and recheck in AM. She's also not eating very well.   HTN: Currently low-normotensive.  - Continue holding norvasc with sepsis resolving  Hyperlipidemia:  - Continue home statin  DVT prophylaxis: Lovenox 30mg  q24h  Code Status: Full Family Communication:  Daughter by phone this AM and 2 other daughters at bedside later this afternoon. Disposition Plan: Plan DC home with home health services once mentation has returned more near baseline and taking better po. Anticipate slower than usual recovery in this elderly patient from gram negative sepsis/bacteremia.   Consultants:   None  Procedures:   None  Antimicrobials:  Vancomycin, cefepime 7/27-28  Ceftriaxone 7/28 >   Subjective: Was sitting in urine and feces without knowing it. Has no specific complaints. Daughters report she isn't eating, though she's not had vomiting. No abd pain or fever.  Objective: Vitals:   01/04/18 2112 01/05/18 0541 01/05/18 0614 01/05/18 1352  BP: (!) 153/89 (!) 159/98 (!) 152/85 (!) 163/92  Pulse: 87 87 (!) 107 79  Resp:  16 16 20   Temp:  99.3 F (37.4 C) 98.3 F (36.8 C) 99 F (37.2 C)  TempSrc:  Oral Oral Oral  SpO2:  96% 98% 94%  Weight:      Height:        Intake/Output Summary (Last 24 hours) at 01/05/2018 1641 Last data filed at 01/05/2018 1530 Gross per 24 hour  Intake 2610.48 ml  Output 1450 ml  Net 1160.48 ml   Filed Weights   01/02/18 1817  Weight: 73 kg (161 lb)   Gen: 82 y.o. female in no distress Pulm: Nonlabored breathing room air. Clear. CV: Regular rate and rhythm. No murmur, rub, or gallop. No JVD, no  dependent edema. GI: Abdomen soft, non-tender, non-distended, with normoactive bowel sounds.  Ext: Warm, no deformities Skin: No rashes, lesions or ulcers on visualized skin.  Neuro: Alert, disoriented, knows her daughters but can't remember all their names. No focal neurological deficits. Psych: Judgement and insight appear impaired. Mood euthymic &  affect congruent. Behavior is appropriate.    Data Reviewed: I have personally reviewed following labs and imaging studies  CBC: Recent Labs  Lab 01/02/18 1638 01/03/18 0712 01/04/18 0635 01/05/18 0648  WBC 22.9* 23.4* 12.9* 10.5  HGB 12.8 11.0* 10.9* 11.9*  HCT 41.6 36.1 34.5* 38.1  MCV 94.5 93.5 91.8 91.4  PLT 245 167 168 195   Basic Metabolic Panel: Recent Labs  Lab 01/02/18 1638 01/03/18 0712 01/04/18 0635 01/05/18 0648  NA 140 144 143 143  K 3.5 3.4* 3.4* 3.5  CL 107 113* 112* 113*  CO2 21* 22 21* 21*  GLUCOSE 140* 98 125* 106*  BUN 18 16 10  6*  CREATININE 1.65* 1.46* 1.27* 1.07*  CALCIUM 8.6* 7.9* 8.3* 8.5*  MG  --   --   --  1.5*   GFR: Estimated Creatinine Clearance: 40.8 mL/min (A) (by C-G formula based on SCr of 1.07 mg/dL (H)). Liver Function Tests: Recent Labs  Lab 01/02/18 1638 01/04/18 0635  AST 26 33  ALT 12 14  ALKPHOS 75 69  BILITOT 1.0 0.9  PROT 7.7 6.4*  ALBUMIN 3.0* 2.3*   Recent Results (from the past 240 hour(s))  Urine Culture     Status: Abnormal (Preliminary result)   Collection Time: 01/02/18  5:53 PM  Result Value Ref Range Status   Specimen Description URINE, RANDOM  Final   Special Requests NONE  Final   Culture (A)  Final    >=100,000 COLONIES/mL ESCHERICHIA COLI REPEATING SUSCEPTIBILITIES Performed at [facility], [address] [place], [place] [address]    Report Status PENDING  Incomplete  Blood Culture (routine x 2)     Status: None (Preliminary result)   Collection Time: 01/02/18  6:45 PM  Result Value Ref Range Status   Specimen Description BLOOD LEFT HAND  Final   Special Requests   Final    BOTTLES DRAWN AEROBIC AND ANAEROBIC Blood Culture results may not be optimal due to an inadequate volume of blood received in culture bottles   Culture   Final    NO GROWTH 3 DAYS Performed at [facility], [address] [place], [place] [address]    Report Status PENDING  Incomplete  Blood Culture  (routine x 2)     Status: Abnormal   Collection Time: 01/02/18  7:36 PM  Result Value Ref Range Status   Specimen Description BLOOD RIGHT HAND  Final   Special Requests   Final    BOTTLES DRAWN AEROBIC ONLY Blood Culture adequate volume   Culture  Setup Time   Final    GRAM NEGATIVE RODS AEROBIC BOTTLE ONLY CRITICAL RESULT CALLED TO, READ BACK BY AND VERIFIED WITH: T MAKHLOUF,PHARMD AT [id number] 01/03/18 BY L BENFIELD Performed at [facility], [address] [place], [place] [address]    Culture ESCHERICHIA COLI (A)  Final   Report Status 01/05/2018 FINAL  Final   Organism ID, Bacteria ESCHERICHIA COLI  Final      Susceptibility   Escherichia coli - MIC*    AMPICILLIN <=2 SENSITIVE Sensitive     CEFAZOLIN <=4 SENSITIVE Sensitive     CEFEPIME <=1 SENSITIVE Sensitive  CEFTAZIDIME <=1 SENSITIVE Sensitive     CEFTRIAXONE <=1 SENSITIVE Sensitive     CIPROFLOXACIN <=0.25 SENSITIVE Sensitive     GENTAMICIN <=1 SENSITIVE Sensitive     IMIPENEM <=0.25 SENSITIVE Sensitive     TRIMETH/SULFA <=20 SENSITIVE Sensitive     AMPICILLIN/SULBACTAM <=2 SENSITIVE Sensitive     PIP/TAZO <=4 SENSITIVE Sensitive     Extended ESBL NEGATIVE Sensitive     * ESCHERICHIA COLI  Blood Culture ID Panel (Reflexed)     Status: Abnormal   Collection Time: 01/02/18  7:36 PM  Result Value Ref Range Status   Enterococcus species NOT DETECTED NOT DETECTED Final   Listeria monocytogenes NOT DETECTED NOT DETECTED Final   Staphylococcus species NOT DETECTED NOT DETECTED Final   Staphylococcus aureus NOT DETECTED NOT DETECTED Final   Streptococcus species NOT DETECTED NOT DETECTED Final   Streptococcus agalactiae NOT DETECTED NOT DETECTED Final   Streptococcus pneumoniae NOT DETECTED NOT DETECTED Final   Streptococcus pyogenes NOT DETECTED NOT DETECTED Final   Acinetobacter baumannii NOT DETECTED NOT DETECTED Final   Enterobacteriaceae species DETECTED (A) NOT DETECTED Final    Comment: Enterobacteriaceae  represent a large family of gram-negative bacteria, not a single organism. CRITICAL RESULT CALLED TO, READ BACK BY AND VERIFIED WITH: T MAKHLOUF,PHARMD AT 1610 01/03/18 BY L BENFIELD    Enterobacter cloacae complex NOT DETECTED NOT DETECTED Final   Escherichia coli DETECTED (A) NOT DETECTED Final    Comment: CRITICAL RESULT CALLED TO, READ BACK BY AND VERIFIED WITH: T MAKHLOUF,PHARMD AT 9604 01/03/18 BY L BENFIELD    Klebsiella oxytoca NOT DETECTED NOT DETECTED Final   Klebsiella pneumoniae NOT DETECTED NOT DETECTED Final   Proteus species NOT DETECTED NOT DETECTED Final   Serratia marcescens NOT DETECTED NOT DETECTED Final   Carbapenem resistance NOT DETECTED NOT DETECTED Final   Haemophilus influenzae NOT DETECTED NOT DETECTED Final   Neisseria meningitidis NOT DETECTED NOT DETECTED Final   Pseudomonas aeruginosa NOT DETECTED NOT DETECTED Final   Candida albicans NOT DETECTED NOT DETECTED Final   Candida glabrata NOT DETECTED NOT DETECTED Final   Candida krusei NOT DETECTED NOT DETECTED Final   Candida parapsilosis NOT DETECTED NOT DETECTED Final   Candida tropicalis NOT DETECTED NOT DETECTED Final    Comment: Performed at [facility], [address] [place], [place] [address]      Time spent: 25 minutes.  Tyrone Nine, MD [facility] [email or web address] Password [facility] 01/05/2018, 4:41 PM

## 2018-01-05 NOTE — Progress Notes (Signed)
Occupational Therapy Evaluation Patient Details Name: Carol [name] MRN: [id number] DOB: [birth year] Today's Date: 01/05/2018    History of Present Illness Pt is an 82 y.o. female with medical history significant of hypertension, hyperlipidemia, depression, dementia, former smoker, varicose vein, UTI (positiver urine culture for MRSA 06/01/15), who presents with AMS and fever. Admitted for sepsis from UTI, although CXR is also concerning for possible PNA in the setting of fevers.    Clinical Impression   PTA, pt was living at home with husband, was independent with ambulation and received assistance from family for medication management, driving, and ADLs. PTA, pt had HHaide 2x/wk and was receiving meals from [employer]. Upon arrival pt was laying in saturated bed and appeared unaware she had a BM. Pt required VC for initiation of hygiene and self-care. Pt currently requires minA for ADLs and functional mobility. Per verbal conversation with daughter, family's preference is for pt to d/c to SNF for short-term rehabilitation prior to returning home, but they are open to both options of SNF vs home with HHOT. Due to deficits listed below (see OT problem list), pt would benefit from acute OT to address establish goals to facilitate safe D/C home. Feel pt would significantly benefit from returning to most familiar environment with 24/7 support available and HHOT  follow-up.      Follow Up Recommendations  Home health OT;Supervision/Assistance - 24 hour    Equipment Recommendations  None recommended by OT    Recommendations for Other Services PT consult     Precautions / Restrictions Restrictions Weight Bearing Restrictions: No      Mobility Bed Mobility Overal bed mobility: Needs Assistance Bed Mobility: Rolling;Sidelying to Sit Rolling: Min guard Sidelying to sit: Min guard          Transfers Overall transfer level: Needs assistance Equipment used: None Transfers: Sit  to/from Stand Sit to Stand: Min assist              Balance Overall balance assessment: Mild deficits observed, not formally tested                                         ADL either performed or assessed with clinical judgement   ADL Overall ADL's : Needs assistance/impaired Eating/Feeding: Set up;Sitting   Grooming: Cueing for sequencing;Standing;Minimal assistance Grooming Details (indicate cue type and reason): pt completed handwashing while standing at sink, VC for initiation and sequencing, minguard for stability  Upper Body Bathing: Min guard;Sitting   Lower Body Bathing: Sit to/from stand;Min guard   Upper Body Dressing : Set up;Sitting   Lower Body Dressing: Sit to/from stand;Minimal assistance   Toilet Transfer: Ambulation;Minimal assistance Toilet Transfer Details (indicate cue type and reason): pt ambulated to [facility]  Toileting- Clothing Manipulation and Hygiene: Sit to/from stand;Minimal assistance Toileting - Clothing Manipulation Details (indicate cue type and reason): pt incontinent urine/bowel upon arrival     Functional mobility during ADLs: Minimal assistance General ADL Comments: pt required VC for initiation/completion of ADL tasks     Vision Baseline Vision/History: Wears glasses Wears Glasses: Reading only Patient Visual Report: No change from baseline       Perception     Praxis      Pertinent Vitals/Pain Pain Assessment: No/denies pain     Hand Dominance Right   Extremity/Trunk Assessment Upper Extremity Assessment Upper Extremity Assessment: Overall WFL for tasks assessed  Lower Extremity Assessment Lower Extremity Assessment: Defer to PT evaluation   Cervical / Trunk Assessment Cervical / Trunk Assessment: Normal   Communication Communication Communication: Other (comment)(STM deficits)   Cognition Arousal/Alertness: Awake/alert Behavior During Therapy: Agitated Overall Cognitive Status: Impaired/Different  from baseline Area of Impairment: Orientation;Memory;Following commands;Safety/judgement;Awareness;Problem solving                 Orientation Level: Disoriented to;Time;Situation   Memory: Decreased short-term memory Following Commands: Follows one step commands inconsistently Safety/Judgement: Decreased awareness of deficits;Decreased awareness of safety Awareness: Intellectual Problem Solving: Slow processing;Decreased initiation;Requires verbal cues;Difficulty sequencing General Comments: upon arrival pt saturated in urine and BM, appeared unaware;pt had decreased awareness of need for self-hygiene and required VC for initiation of hygiene;pt appeared agitated with VC for pt to perfrom hygiene and hand/washing by herself;pt demonstrated decreased initiation with handwashing placing hands under the faucet requiring VC to turn faucet on   General Comments  spoke with pt's daughter, [name], via phonecall, who provided/validated pt's history and prior level of function;discussed d/c plans with [name], who mentioned it is family's preference for pt to d/c SNF for short-term rehab prior to return home;educated daughter on importance of pt returning to familiar environment with 24/7 assistance    Exercises     Shoulder Instructions      Home Living Family/patient expects to be discharged to:: Private residence Living Arrangements: Children Available Help at Discharge: Family;Available 24 hours/day Type of Home: House Home Access: Stairs to enter [employer] of Steps: 3(5 steps in back rail on left side ) Entrance Stairs-Rails: Right;Left;Can reach both Home Layout: One level     Bathroom Shower/Tub: Producer, television/film/video: Handicapped height(and standard)     Home Equipment: Shower seat - built in   Additional Comments: history per pt report and verified by pt's daughter, [name];[name] in [place] is family preference, closest to oldest daughter who  does not work;pt lives with husband who is at [place] receiving care, husband is unable to provide physical assistance      Prior Functioning/Environment Level of Independence: Needs assistance  Gait / Transfers Assistance Needed: independent with ambulation;had assistance from [facility] for shower transfers ADL's / Homemaking Assistance Needed: pt had meals on wheels;HHaide 2x/wk to assist with full bathing, family assisted with sponge baths PRN;family assisted with medication management and driving Communication / Swallowing Assistance Needed: pt's daughter reports, pt has STM deficits at baseline, but does not have current level of confusion and is continent at baseline Comments: pt assistance 24/7, 1 daughter with her during the day, other 3 daughters have night time rotation        OT Problem List: Impaired balance (sitting and/or standing);Decreased activity tolerance;Decreased cognition;Decreased safety awareness;Decreased knowledge of use of DME or AE      OT Treatment/Interventions: Self-care/ADL training;Therapeutic exercise;Energy conservation;Therapeutic activities;Cognitive remediation/compensation;Patient/family education;Balance training    OT Goals(Current goals can be found in the care plan section) Acute Rehab OT Goals Patient Stated Goal: to go home OT Goal Formulation: With patient Time For Goal Achievement: 01/19/18 Potential to Achieve Goals: Good  OT Frequency: Min 2X/week   Barriers to D/C:            Co-evaluation              AM-PAC PT "6 Clicks" Daily Activity     Outcome Measure Help from another person eating meals?: None Help from another person taking care of personal grooming?: A Little Help from another person toileting,  which includes using toliet, bedpan, or urinal?: A Little Help from another person bathing (including washing, rinsing, drying)?: A Little Help from another person to put on and taking off regular upper body clothing?: A  Little Help from another person to put on and taking off regular lower body clothing?: A Little 6 Click Score: 19   End of Session Equipment Utilized During Treatment: Gait belt Nurse Communication: Mobility status;Other (comment)(bed saturated)  Activity Tolerance: Patient tolerated treatment well Patient left: in chair;with call bell/phone within reach;with chair alarm set  OT Visit Diagnosis: Unsteadiness on feet (R26.81);Other abnormalities of gait and mobility (R26.89);Muscle weakness (generalized) (M62.81);Other symptoms and signs involving cognitive function                Time: [id number] OT Time Calculation (min): 25 min Charges:     Diona Brownereresa Thompson OTS    Diona Brownereresa Thompson 01/05/2018, 9:39 AM

## 2018-01-06 LAB — BASIC METABOLIC PANEL
Anion gap: 9 (ref 5–15)
BUN: 8 mg/dL (ref 8–23)
CALCIUM: 8.4 mg/dL — AB (ref 8.9–10.3)
CO2: 23 mmol/L (ref 22–32)
Chloride: 109 mmol/L (ref 98–111)
Creatinine, Ser: 1.06 mg/dL — ABNORMAL HIGH (ref 0.44–1.00)
GFR calc Af Amer: 56 mL/min — ABNORMAL LOW (ref >60)
GFR calc non Af Amer: 48 mL/min — ABNORMAL LOW (ref >60)
GLUCOSE: 99 mg/dL (ref 70–99)
Potassium: 3.1 mmol/L — ABNORMAL LOW (ref 3.5–5.1)
SODIUM: 141 mmol/L (ref 135–145)

## 2018-01-06 LAB — MAGNESIUM: MAGNESIUM: 2 mg/dL (ref 1.7–2.4)

## 2018-01-06 LAB — URINE CULTURE: Culture: >=100000 — AB

## 2018-01-06 MED ORDER — POTASSIUM CHLORIDE IN NACL 20-0.45 MEQ/L-% IV SOLN
INTRAVENOUS | Status: DC
  Administered 2018-01-06 – 2018-01-07 (×2): via INTRAVENOUS
  Filled 2018-01-06 (×3): qty 1000

## 2018-01-06 MED ORDER — POTASSIUM CHLORIDE CRYS ER 20 MEQ PO TBCR
40.0000 meq | EXTENDED_RELEASE_TABLET | Freq: Once | ORAL | Status: AC
  Administered 2018-01-06: 40 meq via ORAL
  Filled 2018-01-06: qty 2

## 2018-01-06 NOTE — Evaluation (Signed)
Occupational Therapy Evaluation Patient Details Name: Carol [name] MRN: [id number] DOB: 04/28/1936 Today's Date: 01/06/2018    History of Present Illness Pt is an 82 y.o. female with medical history significant of hypertension, hyperlipidemia, depression, dementia, former smoker, varicose vein, UTI (positiver urine culture for MRSA 06/01/15), who presents with AMS and fever. Admitted for sepsis from UTI, although CXR is also concerning for possible PNA in the setting of fevers.    Clinical Impression   Pt cooperative and pleasant. Walked in unit with min assist, no overt LOB, but mildly unsteady. Performed UB dressing with set up, toileting and standing grooming with min guard assist. Continue to recommend HHOT.    Follow Up Recommendations  Home health OT;Supervision/Assistance - 24 hour    Equipment Recommendations  None recommended by OT    Recommendations for Other Services       Precautions / Restrictions Precautions Precautions: Fall      Mobility Bed Mobility               General bed mobility comments: up in recliner upon OT arrival  Transfers Overall transfer level: Needs assistance Equipment used: None Transfers: Sit to/from Stand Sit to Stand: Min guard         General transfer comment: for safety    Balance                                           ADL either performed or assessed with clinical judgement   ADL Overall ADL's : Needs assistance/impaired     Grooming: Wash/dry hands;Standing;Supervision/safety Grooming Details (indicate cue type and reason): cues to locate soap dispenser         Upper Body Dressing : Set up;Sitting Upper Body Dressing Details (indicate cue type and reason): front opening gown     Toilet Transfer: Ambulation;Minimal assistance Toilet Transfer Details (indicate cue type and reason): min guard, pt trailing the sink Toileting- Clothing Manipulation and Hygiene: Min guard;Sit to/from  stand       Functional mobility during ADLs: Minimal assistance(mild instability)       Vision         Perception     Praxis      Pertinent Vitals/Pain Pain Assessment: No/denies pain     Hand Dominance     Extremity/Trunk Assessment             Communication     Cognition Arousal/Alertness: Awake/alert Behavior During Therapy: WFL for tasks assessed/performed Overall Cognitive Status: No family/caregiver present to determine baseline cognitive functioning Area of Impairment: Memory;Safety/judgement                     Memory: Decreased short-term memory   Safety/Judgement: Decreased awareness of safety;Decreased awareness of deficits     General Comments: pt pleasant and cooperative   General Comments       Exercises     Shoulder Instructions      Home Living                                          Prior Functioning/Environment                   OT Problem List:        OT Treatment/Interventions:  OT Goals(Current goals can be found in the care plan section) Acute Rehab OT Goals Patient Stated Goal: to go home OT Goal Formulation: With patient Time For Goal Achievement: 01/19/18 Potential to Achieve Goals: Good  OT Frequency: Min 2X/week   Barriers to D/C:            Co-evaluation              AM-PAC PT "6 Clicks" Daily Activity     Outcome Measure Help from another person eating meals?: None Help from another person taking care of personal grooming?: A Little Help from another person toileting, which includes using toliet, bedpan, or urinal?: A Little Help from another person bathing (including washing, rinsing, drying)?: A Little Help from another person to put on and taking off regular upper body clothing?: None Help from another person to put on and taking off regular lower body clothing?: A Little 6 Click Score: 20   End of Session Equipment Utilized During Treatment: Gait  belt  Activity Tolerance: Patient tolerated treatment well Patient left: in chair;with call bell/phone within reach;with chair alarm set  OT Visit Diagnosis: Unsteadiness on feet (R26.81);Other abnormalities of gait and mobility (R26.89);Muscle weakness (generalized) (M62.81);Other symptoms and signs involving cognitive function                Time: 1610-9604 OT Time Calculation (min): 19 min Charges:  OT General Charges $OT Visit: 1 Visit OT Treatments $Self Care/Home Management : 8-22 mins  01/06/2018 Martie Round, OTR/L Pager: [phone]  Iran Planas, [place] Bailiff 01/06/2018, 3:45 PM

## 2018-01-06 NOTE — Progress Notes (Signed)
PROGRESS NOTE  Carol [name]  ZOX:[id number] DOB: [birth year] DOA: 01/02/2018 PCP: Kirt Boys, DO   Brief Narrative: Carol [name] is an 82 y.o. female with a history of dementia and HTN who presented from home with AMS (incontinence, sluggishness, decreased po intake) and fever. Work up revealed possibly bibasilar infiltrates on CXR and urinalysis highly suggestive of infection. She was tachycardic with leukocytosis to 22.9k and AKI with creatinine of 1.65. Head CT negative. Vancomycin and cefepime were started and patient admitted. Urine and 1 of 4 blood culture bottles grew E. coli, so antibiotics narrowed to ceftriaxone pending susceptibilities. Leukocytosis has improved, fever curve improving and creatinine has declined. The patient continues to have some altered mentation from baseline but no focal deficits on exam, still newly incontinent of feces and urine.  Assessment & Plan: Principal Problem:   UTI (urinary tract infection) Active Problems:   Mixed Alzheimer's and vascular dementia   Hyperlipidemia   AKI (acute kidney injury) (HCC)   Acute metabolic encephalopathy   Sepsis (HCC)  Sepsis due to E. coli bacteremia from urinary source:  - Continue ceftriaxone 2g q24h (pansensitivity on Cx). Will plan to transition to po at discharge for total 14 days of therapy.   Acute metabolic encephalopathy on chronic dementia: Due to sepsis/bacteremia. Improving, but not back to mental baseline as she's disoriented more than usual and having urinary and fecal incontinence/unawareness which is completely new.  - Continue abx as above, treating the presumed source of AMS. No new medications were started.  - Delirium precautions - Continue home aricept, namenda, rivastigmine and seroquel.  Urinary fecal incontinence: Suspect this is due to the acute encephalopathy in addition to baseline decreased cognitive capacity from dementia. No sensory deficits on exam and is able to hold  urine intermittently.  - Continue to monitor.  - Discussed with daughter that this can be a progression of symptoms of dementia.   AKI: Due to sepsis. Improving.  - Continue monitoring on IV fluids. Creatinine baseline is 0.9. Remains slightly elevated from baseline.  Mild hypokalemia: Due to diminished po intake.  - Replace this AM and recheck tomorrow. Will put into maintenance IV fluids as well.  HTN: Currently low-normotensive.  - Continue holding norvasc with sepsis resolving  Hyperlipidemia:  - Continue home statin  DVT prophylaxis: Lovenox 30mg  q24h  Code Status: Full Family Communication:  Daughter by phone. Disposition Plan: Plan DC home with home health services once mentation has returned more near baseline and taking better po. Anticipate slower than usual recovery in this elderly patient from gram negative sepsis/bacteremia. Hopeful for next 24-48 hours.  Consultants:   None  Procedures:   None  Antimicrobials:  Vancomycin, cefepime 7/27-28  Ceftriaxone 7/28 >   Subjective: This morning she was found to have been incontinent without awareness of it. She denies any complaints or concerns. She still isn't eating very much, another departure from her baseline. No fever.  Objective: Vitals:   01/05/18 2153 01/05/18 2158 01/05/18 2306 01/06/18 0459  BP: (!) 178/115 (!) 172/100 130/82 125/79  Pulse: 81  86 84  Resp: 20   18  Temp: 98.1 F (36.7 C)   99.5 F (37.5 C)  TempSrc: Oral   Oral  SpO2: 99%   95%  Weight:      Height:        Intake/Output Summary (Last 24 hours) at 01/06/2018 1203 Last data filed at 01/06/2018 0900 Gross per 24 hour  Intake 1790.43 ml  Output -  Net 1790.43 ml   Filed Weights   01/02/18 1817  Weight: 73 kg (161 lb)   Gen: 82 y.o. female in no distress Pulm: Nonlabored breathing room air. Clear. CV: Regular rate and rhythm. No murmur, rub, or gallop. No JVD, no dependent edema. GI: Abdomen soft, some suprapubic  tenderness, but improved, mild, otherwise non-tender, non-distended, with normoactive bowel sounds.  Ext: Warm, no deformities Skin: No rashes, lesions or ulcers on visualized skin.  Neuro: Alert, disoriented, remembered all daughter's names today but didn't know exactly where she was or the date. This is abnormal for her. No focal neurological deficits. Psych: Judgement and insight appear impaired. Mood euthymic & affect congruent. Behavior is appropriate.    Data Reviewed: I have personally reviewed following labs and imaging studies  CBC: Recent Labs  Lab 01/02/18 1638 01/03/18 0712 01/04/18 0635 01/05/18 0648  WBC 22.9* 23.4* 12.9* 10.5  HGB 12.8 11.0* 10.9* 11.9*  HCT 41.6 36.1 34.5* 38.1  MCV 94.5 93.5 91.8 91.4  PLT 245 167 168 195   Basic Metabolic Panel: Recent Labs  Lab 01/02/18 1638 01/03/18 0712 01/04/18 0635 01/05/18 0648 01/06/18 0327  NA 140 144 143 143 141  K 3.5 3.4* 3.4* 3.5 3.1*  CL 107 113* 112* 113* 109  CO2 21* 22 21* 21* 23  GLUCOSE 140* 98 125* 106* 99  BUN 18 16 10  6* 8  CREATININE 1.65* 1.46* 1.27* 1.07* 1.06*  CALCIUM 8.6* 7.9* 8.3* 8.5* 8.4*  MG  --   --   --  1.5* 2.0   GFR: Estimated Creatinine Clearance: 41.2 mL/min (A) (by C-G formula based on SCr of 1.06 mg/dL (H)). Liver Function Tests: Recent Labs  Lab 01/02/18 1638 01/04/18 0635  AST 26 33  ALT 12 14  ALKPHOS 75 69  BILITOT 1.0 0.9  PROT 7.7 6.4*  ALBUMIN 3.0* 2.3*   Recent Results (from the past 240 hour(s))  Urine Culture     Status: Abnormal   Collection Time: 01/02/18  5:53 PM  Result Value Ref Range Status   Specimen Description URINE, RANDOM  Final   Special Requests   Final    NONE Performed at [facility], [address] [place], [place] [address]    Culture >=100,000 COLONIES/mL ESCHERICHIA COLI (A)  Final   Report Status 01/06/2018 FINAL  Final   Organism ID, Bacteria ESCHERICHIA COLI (A)  Final      Susceptibility   Escherichia coli - MIC*     AMPICILLIN <=2 SENSITIVE Sensitive     CEFAZOLIN <=4 SENSITIVE Sensitive     CEFTRIAXONE <=1 SENSITIVE Sensitive     CIPROFLOXACIN <=0.25 SENSITIVE Sensitive     GENTAMICIN <=1 SENSITIVE Sensitive     IMIPENEM <=0.25 SENSITIVE Sensitive     NITROFURANTOIN <=16 SENSITIVE Sensitive     TRIMETH/SULFA <=20 SENSITIVE Sensitive     AMPICILLIN/SULBACTAM <=2 SENSITIVE Sensitive     PIP/TAZO <=4 SENSITIVE Sensitive     Extended ESBL NEGATIVE Sensitive     * >=100,000 COLONIES/mL ESCHERICHIA COLI  Blood Culture (routine x 2)     Status: None (Preliminary result)   Collection Time: 01/02/18  6:45 PM  Result Value Ref Range Status   Specimen Description BLOOD LEFT HAND  Final   Special Requests   Final    BOTTLES DRAWN AEROBIC AND ANAEROBIC Blood Culture results may not be optimal due to an inadequate volume of blood received in culture bottles   Culture   Final  NO GROWTH 3 DAYS Performed at [facility], [address] [place], [place] [address]    Report Status PENDING  Incomplete  Blood Culture (routine x 2)     Status: Abnormal   Collection Time: 01/02/18  7:36 PM  Result Value Ref Range Status   Specimen Description BLOOD RIGHT HAND  Final   Special Requests   Final    BOTTLES DRAWN AEROBIC ONLY Blood Culture adequate volume   Culture  Setup Time   Final    GRAM NEGATIVE RODS AEROBIC BOTTLE ONLY CRITICAL RESULT CALLED TO, READ BACK BY AND VERIFIED WITH: T MAKHLOUF,PHARMD AT [id number] 01/03/18 BY L BENFIELD Performed at [facility], [address] [place], [place] [address]    Culture ESCHERICHIA COLI (A)  Final   Report Status 01/05/2018 FINAL  Final   Organism ID, Bacteria ESCHERICHIA COLI  Final      Susceptibility   Escherichia coli - MIC*    AMPICILLIN <=2 SENSITIVE Sensitive     CEFAZOLIN <=4 SENSITIVE Sensitive     CEFEPIME <=1 SENSITIVE Sensitive     CEFTAZIDIME <=1 SENSITIVE Sensitive     CEFTRIAXONE <=1 SENSITIVE Sensitive     CIPROFLOXACIN <=0.25 SENSITIVE  Sensitive     GENTAMICIN <=1 SENSITIVE Sensitive     IMIPENEM <=0.25 SENSITIVE Sensitive     TRIMETH/SULFA <=20 SENSITIVE Sensitive     AMPICILLIN/SULBACTAM <=2 SENSITIVE Sensitive     PIP/TAZO <=4 SENSITIVE Sensitive     Extended ESBL NEGATIVE Sensitive     * ESCHERICHIA COLI  Blood Culture ID Panel (Reflexed)     Status: Abnormal   Collection Time: 01/02/18  7:36 PM  Result Value Ref Range Status   Enterococcus species NOT DETECTED NOT DETECTED Final   Listeria monocytogenes NOT DETECTED NOT DETECTED Final   Staphylococcus species NOT DETECTED NOT DETECTED Final   Staphylococcus aureus NOT DETECTED NOT DETECTED Final   Streptococcus species NOT DETECTED NOT DETECTED Final   Streptococcus agalactiae NOT DETECTED NOT DETECTED Final   Streptococcus pneumoniae NOT DETECTED NOT DETECTED Final   Streptococcus pyogenes NOT DETECTED NOT DETECTED Final   Acinetobacter baumannii NOT DETECTED NOT DETECTED Final   Enterobacteriaceae species DETECTED (A) NOT DETECTED Final    Comment: Enterobacteriaceae represent a large family of gram-negative bacteria, not a single organism. CRITICAL RESULT CALLED TO, READ BACK BY AND VERIFIED WITH: T MAKHLOUF,PHARMD AT [id number] 01/03/18 BY L BENFIELD    Enterobacter cloacae complex NOT DETECTED NOT DETECTED Final   Escherichia coli DETECTED (A) NOT DETECTED Final    Comment: CRITICAL RESULT CALLED TO, READ BACK BY AND VERIFIED WITH: T MAKHLOUF,PHARMD AT [id number] 01/03/18 BY L BENFIELD    Klebsiella oxytoca NOT DETECTED NOT DETECTED Final   Klebsiella pneumoniae NOT DETECTED NOT DETECTED Final   Proteus species NOT DETECTED NOT DETECTED Final   Serratia marcescens NOT DETECTED NOT DETECTED Final   Carbapenem resistance NOT DETECTED NOT DETECTED Final   Haemophilus influenzae NOT DETECTED NOT DETECTED Final   Neisseria meningitidis NOT DETECTED NOT DETECTED Final   Pseudomonas aeruginosa NOT DETECTED NOT DETECTED Final   Candida albicans NOT DETECTED NOT DETECTED  Final   Candida glabrata NOT DETECTED NOT DETECTED Final   Candida krusei NOT DETECTED NOT DETECTED Final   Candida parapsilosis NOT DETECTED NOT DETECTED Final   Candida tropicalis NOT DETECTED NOT DETECTED Final    Comment: Performed at [facility], [address] [place], [place] [address]  Time spent: 25 minutes.  Tyrone Nine, MD [facility] [email or web address] Password [facility] 01/06/2018, 12:03 PM

## 2018-01-06 NOTE — Care Management Important Message (Signed)
Important Message  Patient Details  Name: Carol [name] MRN: [id number] Date of Birth: 1935/08/01   Medicare Important Message Given:  Yes    Dorena BodoIris Geran Haithcock 01/06/2018, 10:25 AM

## 2018-01-07 DIAGNOSIS — N390 Urinary tract infection, site not specified: Secondary | ICD-10-CM

## 2018-01-07 DIAGNOSIS — A419 Sepsis, unspecified organism: Secondary | ICD-10-CM

## 2018-01-07 DIAGNOSIS — G9341 Metabolic encephalopathy: Secondary | ICD-10-CM

## 2018-01-07 DIAGNOSIS — N3000 Acute cystitis without hematuria: Secondary | ICD-10-CM

## 2018-01-07 DIAGNOSIS — R159 Full incontinence of feces: Secondary | ICD-10-CM

## 2018-01-07 DIAGNOSIS — N179 Acute kidney failure, unspecified: Secondary | ICD-10-CM

## 2018-01-07 LAB — CBC
HEMATOCRIT: 37.4 % (ref 36.0–46.0)
HEMOGLOBIN: 12 g/dL (ref 12.0–15.0)
MCH: 28.6 pg (ref 26.0–34.0)
MCHC: 32.1 g/dL (ref 30.0–36.0)
MCV: 89 fL (ref 78.0–100.0)
Platelets: 244 10*3/uL (ref 150–400)
RBC: 4.2 MIL/uL (ref 3.87–5.11)
RDW: 14.3 % (ref 11.5–15.5)
WBC: 10.8 10*3/uL — AB (ref 4.0–10.5)

## 2018-01-07 LAB — BASIC METABOLIC PANEL
ANION GAP: 11 (ref 5–15)
BUN: 5 mg/dL — ABNORMAL LOW (ref 8–23)
CHLORIDE: 109 mmol/L (ref 98–111)
CO2: 22 mmol/L (ref 22–32)
Calcium: 8.7 mg/dL — ABNORMAL LOW (ref 8.9–10.3)
Creatinine, Ser: 0.91 mg/dL (ref 0.44–1.00)
GFR calc Af Amer: >60 mL/min (ref >60)
GFR calc non Af Amer: 58 mL/min — ABNORMAL LOW (ref >60)
Glucose, Bld: 99 mg/dL (ref 70–99)
POTASSIUM: 3.5 mmol/L (ref 3.5–5.1)
SODIUM: 142 mmol/L (ref 135–145)

## 2018-01-07 LAB — CULTURE, BLOOD (ROUTINE X 2): CULTURE: NO GROWTH

## 2018-01-07 MED ORDER — CEFUROXIME AXETIL 500 MG PO TABS: 500.0000 mg | ORAL_TABLET | Freq: Two times a day (BID) | ORAL | 0 refills | Status: AC

## 2018-01-07 NOTE — Care Management Note (Signed)
Case Management Note  Patient Details  Name: Carol [name] MRN: [id number] Date of Birth: 1935-12-30  Subjective/Objective:    Admitted for sepsis from UTI. Home alone, [facility]. Thomasenia BottomsHowever,Carol [name] (daughter) states she and siblings will ensure mom is cared for @ d/c.        [name] (Daughter)     [phone]      PCP: [name]  Action/Plan: Per PT/OT evaluations:Home health PT/ OT;Supervision/Assistance - 24 hour. NCM shared recommendations with pt/daughter(Carol [name]). Carol [name] stated family will provide 24/7 supervision /assistance once d/c.  Pt will transition to home with home health services to follow.  Daughter , Carol [name] to provide transportation to home.  Expected Discharge Date:  01/07/18               Expected Discharge Plan:  Home w Home Health Services  In-House Referral:     Discharge planning Services  CM Consult  Post Acute Care Choice:    Choice offered to:  Adult Children  DME Arranged:   N/A DME Agency:   N/A  HH Arranged:  RN, PT, OT, Nurse's Aide, Social Work [employer] Agency:  [facility]  Status of Service:  Completed, signed off  If discussed at [employer] Meetings, dates discussed:    Additional Comments:  Epifanio LeschesCole, Raiden Yearwood Hudson, RN 01/07/2018, 11:59 AM

## 2018-01-07 NOTE — Progress Notes (Signed)
Daughter [name] called, returning a call from doctor Akula. Her number is [phone]

## 2018-01-07 NOTE — Progress Notes (Signed)
Pt's daughter given discharge instructions, prescriptions, and care notes. Daughter verbalized understanding AEB no further questions or concerns at this time. IV was discontinued, no redness, pain, or swelling noted at this time. Pt to leave the floor via wheelchair with staff in stable condition.

## 2018-01-07 NOTE — Progress Notes (Signed)
Physical Therapy Treatment Patient Details Name: Carol [name] MRN: [id number] DOB: 1936/02/27 Today's Date: 01/07/2018    History of Present Illness Pt is an 82 y.o. female with medical history significant of hypertension, hyperlipidemia, depression, dementia, former smoker, varicose vein, UTI (positiver urine culture for MRSA 06/01/15), who presents with AMS and fever. Admitted for sepsis from UTI, although CXR is also concerning for possible PNA in the setting of fevers.     PT Comments    Patient doing well - requires motivation to participate with PT. Min guard for all mobility for general safety as well as VC for safety in environment. Making good progress towards goals. Will continue to follow.   Follow Up Recommendations  Home health PT;Supervision/Assistance - 24 hour     Equipment Recommendations  None recommended by PT    Recommendations for Other Services OT consult     Precautions / Restrictions Precautions Precautions: Fall Restrictions Weight Bearing Restrictions: No    Mobility  Bed Mobility               General bed mobility comments: up in recliner  Transfers Overall transfer level: Needs assistance Equipment used: None Transfers: Sit to/from Stand Sit to Stand: Min guard         General transfer comment: for safety  Ambulation/Gait Ambulation/Gait assistance: Min guard Gait Distance (Feet): 120 Feet Assistive device: None Gait Pattern/deviations: Step-through pattern;Decreased stride length;Staggering left;Staggering right;Drifts right/left;Trunk flexed Gait velocity: decreased   General Gait Details: min guard for upright balance; at times staggering but without LOB   Stairs             Wheelchair Mobility    Modified Rankin (Stroke Patients Only)       Balance Overall balance assessment: Mild deficits observed, not formally tested                                          Cognition  Arousal/Alertness: Awake/alert Behavior During Therapy: WFL for tasks assessed/performed Overall Cognitive Status: No family/caregiver present to determine baseline cognitive functioning Area of Impairment: Memory;Following commands;Safety/judgement;Problem solving                     Memory: Decreased short-term memory Following Commands: Follows one step commands inconsistently;Follows one step commands with increased time Safety/Judgement: Decreased awareness of safety;Decreased awareness of deficits   Problem Solving: Slow processing;Decreased initiation;Difficulty sequencing;Requires verbal cues;Requires tactile cues General Comments: at one time wanting to sit in rolling chair in hallway and becoming agitated when she was not allowed to do so.       Exercises      General Comments        Pertinent Vitals/Pain Pain Assessment: No/denies pain    Home Living                      Prior Function            PT Goals (current goals can now be found in the care plan section) Acute Rehab PT Goals Patient Stated Goal: to go home PT Goal Formulation: With patient Time For Goal Achievement: 01/19/18 Potential to Achieve Goals: Good Progress towards PT goals: Progressing toward goals    Frequency    Min 3X/week      PT Plan Current plan remains appropriate    Co-evaluation  AM-PAC PT "6 Clicks" Daily Activity  Outcome Measure  Difficulty turning over in bed (including adjusting bedclothes, sheets and blankets)?: A Little Difficulty moving from lying on back to sitting on the side of the bed? : A Little Difficulty sitting down on and standing up from a chair with arms (e.g., wheelchair, bedside commode, etc,.)?: Unable Help needed moving to and from a bed to chair (including a wheelchair)?: A Little Help needed walking in hospital room?: A Little Help needed climbing 3-5 steps with a railing? : A Lot 6 Click Score: 15    End of  Session Equipment Utilized During Treatment: Gait belt Activity Tolerance: Patient tolerated treatment well Patient left: in chair;with call bell/phone within reach;with chair alarm set Nurse Communication: Mobility status PT Visit Diagnosis: Unsteadiness on feet (R26.81);Other abnormalities of gait and mobility (R26.89);Muscle weakness (generalized) (M62.81)     Time: [id number] PT Time Calculation (min) (ACUTE ONLY): 15 min  Charges:  $Gait Training: 8-22 mins                     Kipp LaurenceStephanie R Aaron, PT, DPT 01/07/18 4:14 PM Pager: [phone]

## 2018-01-08 ENCOUNTER — Telehealth: Payer: Self-pay

## 2018-01-08 NOTE — Telephone Encounter (Signed)
I have made the 1st attempt to contact the patient or family member in charge, in order to follow up from recently being discharged from the hospital. I left a message on voicemail but I will make another attempt at a different time.  

## 2018-01-08 NOTE — Telephone Encounter (Signed)
I have made the 2nd attempt to contact the patient or family member in charge, in order to follow up from recently being discharged from the hospital. I left a message on voicemail requesting a CB to the clinic.

## 2018-01-08 NOTE — Discharge Summary (Signed)
Physician Discharge Summary  Carol [name] ZOX:[id number] DOB: Aug 31, 1935 DOA: 01/02/2018  PCP: Kirt Boys, DO  Admit date: 01/02/2018 Discharge date: 01/07/2018  Admitted From: home.  Disposition:  Home.  Recommendations for Outpatient Follow-up:  1. Follow up with PCP in 1-2 weeks 2. Please obtain BMP/CBC in one week 3. Outpatient follow up with neurology for worsening dementia.    Home Health:yes   Discharge Condition:STABLE.  CODE STATUS: FULL CODE.  Diet recommendation: Heart Healthy  Brief/Interim Summary: Carol [name] is an 82 y.o. female with a history of dementia and HTN who presented from home with AMS (incontinence, sluggishness, decreased po intake) and fever. Work up revealed possibly bibasilar infiltrates on CXR and urinalysis highly suggestive of infection. She was tachycardic with leukocytosis to 22.9k and AKI with creatinine of 1.65. Head CT negative. Vancomycin and cefepime were started and patient admitted. Urine and 1 of 4 blood culture bottles grew E. coli, so antibiotics narrowed to ceftriaxone pending susceptibilities. The cultures show pan sensitive E COLI and she was discharged on ceftin for another 10 days to complete a 14 day course.   Discharge Diagnoses:  Principal Problem:   UTI (urinary tract infection) Active Problems:   Mixed Alzheimer's and vascular dementia   Hyperlipidemia   AKI (acute kidney injury) (HCC)   Acute metabolic encephalopathy   Sepsis (HCC)  Sepsis due to E. coli bacteremia from urinary source:  - she was started on ceftriaxone 2g q24h (pansensitivity on Cx).transition to oral ceftin for another 10 days to complete a 14 day course.  - afebrile and leukocytosis improved.    Acute metabolic encephalopathy on chronic dementia: Due to sepsis/bacteremia. Improving,.  - she is alert and oriented to place and person today and would like to go home. Discussed with daughters and recommended outpatient neurology follow up  for her worsening dementia.  - Delirium precautions - Continue home aricept, namenda, rivastigmine and seroquel.  Urinary fecal incontinence: Suspect this is due to the acute encephalopathy in addition to baseline decreased cognitive capacity from dementia. No sensory deficits on exam and is able to hold urine intermittently.  - also possible with antibiotics.  - appears to have resolved by now.   AKI: Due to sepsis. Improving.  - Continue monitoring on IV fluids. Creatinine baseline is 0.9.  - back to baseline with fluids.   Mild hypokalemia: Due to diminished po intake.  Replaced. Marland Kitchen  HTN: well controlled.    Hyperlipidemia:  - Continue home statin    Discharge Instructions  Discharge Instructions    Diet - low sodium heart healthy   Complete by:  As directed    Discharge instructions   Complete by:  As directed    Please follow up with PCP in one week.     Allergies as of 01/07/2018      Reactions   Penicillins Swelling, Rash   Tolerate ceftriaxone      Medication List    STOP taking these medications   polyethylene glycol powder powder Commonly known as:  GLYCOLAX/MIRALAX     TAKE these medications   acetaminophen 325 MG tablet Commonly known as:  TYLENOL Take 325 mg by mouth daily as needed (pain).   alendronate 70 MG tablet Commonly known as:  FOSAMAX TAKE 1 TABLET(70 MG) BY MOUTH EVERY 7 DAYS WITH A FULL GLASS OF WATER AND ON AN EMPTY STOMACH What changed:  See the new instructions.   amLODipine 5 MG tablet Commonly known as:  NORVASC Take 1  tablet (5 mg total) by mouth daily. What changed:  when to take this   atorvastatin 20 MG tablet Commonly known as:  LIPITOR TAKE 1 TABLET BY MOUTH EVERY DAY FOR CHOLESTEROL What changed:    how much to take  how to take this  when to take this  additional instructions   cefUROXime 500 MG tablet Commonly known as:  CEFTIN Take 1 tablet (500 mg total) by mouth 2 (two) times daily for 10 days.    Memantine HCl-Donepezil HCl 28-10 MG Cp24 Commonly known as:  NAMZARIC Take 1 capsule by mouth daily. What changed:  when to take this   multivitamin with minerals tablet Take 1 tablet by mouth daily with supper. Centrum Silver   nystatin powder Commonly known as:  MYCOSTATIN/NYSTOP Apply topically 2 (two) times daily. To groin and abdomen area   QUEtiapine 100 MG tablet Commonly known as:  SEROQUEL TAKE 1 TABLET BY MOUTH EVERY MORNING AND 1 TABLET AT BEDTIME What changed:    how much to take  how to take this  when to take this  additional instructions   rivastigmine 13.3 MG/24HR Commonly known as:  EXELON Place 1 patch (13.3 mg total) onto the skin daily. What changed:  when to take this      Follow-up Information    [facility] Follow up.   Specialty:  Home Health Services Why:  home health services arranged Contact information: [address] [place] [address] [phone]          Allergies  Allergen Reactions  . Penicillins Swelling and Rash    Tolerate ceftriaxone    Consultations:  None.    Procedures/Studies: Dg Chest 2 View  Result Date: 01/02/2018 CLINICAL DATA:  Fever.  Altered mental status. EXAM: CHEST - 2 VIEW COMPARISON:  Chest radiograph 09/23/2009, CT 09/16/2009 FINDINGS: Lower lung volumes from prior exam. The heart is enlarged. Diffuse aortic tortuosity including prominence of the aortic arch. Diffusely increased interstitial opacities which may be pulmonary edema, atypical infection or bronchovascular crowding. Patchy retrocardiac and peripheral basilar opacities. Possible tiny pleural effusions. No pneumothorax. IMPRESSION: 1. Cardiomegaly with tortuous atherosclerotic aorta. Aortic arch tortuosity is more prominent than prior exam, likely secondary to differences in technique. Recommend follow-up PA and lateral views when patient is able to take better inspiration. 2. Diffusely increased interstitial  opacities may represent pulmonary edema, atypical infection, or less likely bronchovascular crowding secondary to low lung volumes. 3. Patchy retrocardiac and peripheral bibasilar opacities, pneumonia or aspiration is considered in the setting of fever. Electronically Signed   By: Rubye Oaks M.D.   On: 01/02/2018 21:25   Ct Head Wo Contrast  Result Date: 01/02/2018 CLINICAL DATA:  82 year old female with altered mental status. EXAM: CT HEAD WITHOUT CONTRAST TECHNIQUE: Contiguous axial images were obtained from the base of the skull through the vertex without intravenous contrast. COMPARISON:  05/17/2015 and prior CTs FINDINGS: Brain: No evidence of acute infarction, hemorrhage, hydrocephalus, extra-axial collection or mass lesion/mass effect. Atrophy and chronic small-vessel white matter ischemic changes noted. Vascular: Intracranial atherosclerotic calcifications noted periods Skull: Normal. Negative for fracture or focal lesion. Sinuses/Orbits: No acute finding. Chronic RIGHT maxillary sinus mucoperiosteal thickening noted. Other: None. IMPRESSION: 1. No evidence of acute intracranial abnormality 2. Atrophy and chronic small-vessel white matter ischemic changes. Electronically Signed   By: Harmon Pier M.D.   On: 01/02/2018 19:20       Subjective: NO NEW COMPLAINTS.   Discharge Exam: Vitals:  01/07/18 0555 01/07/18 1250  BP: (!) 127/96 136/87  Pulse: 86 78  Resp: 18 18  Temp: 98.5 F (36.9 C) 97.9 F (36.6 C)  SpO2: 99% 99%   Vitals:   01/06/18 1433 01/06/18 2147 01/07/18 0555 01/07/18 1250  BP: (!) 144/95 (!) 147/82 (!) 127/96 136/87  Pulse: 75 83 86 78  Resp: 20 18 18 18   Temp: 99.6 F (37.6 C) 98.7 F (37.1 C) 98.5 F (36.9 C) 97.9 F (36.6 C)  TempSrc: Oral Oral Oral Oral  SpO2: 99% 95% 99% 99%  Weight:      Height:        General: Pt is alert, awake, not in acute distress Cardiovascular: RRR, S1/S2 +, no rubs, no gallops Respiratory: CTA bilaterally, no  wheezing, no rhonchi Abdominal: Soft, NT, ND, bowel sounds + Extremities: no edema, no cyanosis    The results of significant diagnostics from this hospitalization (including imaging, microbiology, ancillary and laboratory) are listed below for reference.     Microbiology: Recent Results (from the past 240 hour(s))  Urine Culture     Status: Abnormal   Collection Time: 01/02/18  5:53 PM  Result Value Ref Range Status   Specimen Description URINE, RANDOM  Final   Special Requests   Final    NONE Performed at [facility], [address] [place], [place] [address]    Culture >=100,000 COLONIES/mL ESCHERICHIA COLI (A)  Final   Report Status 01/06/2018 FINAL  Final   Organism ID, Bacteria ESCHERICHIA COLI (A)  Final      Susceptibility   Escherichia coli - MIC*    AMPICILLIN <=2 SENSITIVE Sensitive     CEFAZOLIN <=4 SENSITIVE Sensitive     CEFTRIAXONE <=1 SENSITIVE Sensitive     CIPROFLOXACIN <=0.25 SENSITIVE Sensitive     GENTAMICIN <=1 SENSITIVE Sensitive     IMIPENEM <=0.25 SENSITIVE Sensitive     NITROFURANTOIN <=16 SENSITIVE Sensitive     TRIMETH/SULFA <=20 SENSITIVE Sensitive     AMPICILLIN/SULBACTAM <=2 SENSITIVE Sensitive     PIP/TAZO <=4 SENSITIVE Sensitive     Extended ESBL NEGATIVE Sensitive     * >=100,000 COLONIES/mL ESCHERICHIA COLI  Blood Culture (routine x 2)     Status: None   Collection Time: 01/02/18  6:45 PM  Result Value Ref Range Status   Specimen Description BLOOD LEFT HAND  Final   Special Requests   Final    BOTTLES DRAWN AEROBIC AND ANAEROBIC Blood Culture results may not be optimal due to an inadequate volume of blood received in culture bottles   Culture   Final    NO GROWTH 5 DAYS Performed at [facility], [address] [place], [place] [address]    Report Status 01/07/2018 FINAL  Final  Blood Culture (routine x 2)     Status: Abnormal   Collection Time: 01/02/18  7:36 PM  Result Value Ref Range Status   Specimen Description  BLOOD RIGHT HAND  Final   Special Requests   Final    BOTTLES DRAWN AEROBIC ONLY Blood Culture adequate volume   Culture  Setup Time   Final    GRAM NEGATIVE RODS AEROBIC BOTTLE ONLY CRITICAL RESULT CALLED TO, READ BACK BY AND VERIFIED WITH: T MAKHLOUF,PHARMD AT [id number] 01/03/18 BY L BENFIELD Performed at [facility], [address] [place], [place] [address]    Culture ESCHERICHIA COLI (A)  Final   Report Status 01/05/2018 FINAL  Final   Organism ID,  Bacteria ESCHERICHIA COLI  Final      Susceptibility   Escherichia coli - MIC*    AMPICILLIN <=2 SENSITIVE Sensitive     CEFAZOLIN <=4 SENSITIVE Sensitive     CEFEPIME <=1 SENSITIVE Sensitive     CEFTAZIDIME <=1 SENSITIVE Sensitive     CEFTRIAXONE <=1 SENSITIVE Sensitive     CIPROFLOXACIN <=0.25 SENSITIVE Sensitive     GENTAMICIN <=1 SENSITIVE Sensitive     IMIPENEM <=0.25 SENSITIVE Sensitive     TRIMETH/SULFA <=20 SENSITIVE Sensitive     AMPICILLIN/SULBACTAM <=2 SENSITIVE Sensitive     PIP/TAZO <=4 SENSITIVE Sensitive     Extended ESBL NEGATIVE Sensitive     * ESCHERICHIA COLI  Blood Culture ID Panel (Reflexed)     Status: Abnormal   Collection Time: 01/02/18  7:36 PM  Result Value Ref Range Status   Enterococcus species NOT DETECTED NOT DETECTED Final   Listeria monocytogenes NOT DETECTED NOT DETECTED Final   Staphylococcus species NOT DETECTED NOT DETECTED Final   Staphylococcus aureus NOT DETECTED NOT DETECTED Final   Streptococcus species NOT DETECTED NOT DETECTED Final   Streptococcus agalactiae NOT DETECTED NOT DETECTED Final   Streptococcus pneumoniae NOT DETECTED NOT DETECTED Final   Streptococcus pyogenes NOT DETECTED NOT DETECTED Final   Acinetobacter baumannii NOT DETECTED NOT DETECTED Final   Enterobacteriaceae species DETECTED (A) NOT DETECTED Final    Comment: Enterobacteriaceae represent a large family of gram-negative bacteria, not a single organism. CRITICAL RESULT CALLED TO, READ BACK BY AND VERIFIED  WITH: T MAKHLOUF,PHARMD AT 1610 01/03/18 BY L BENFIELD    Enterobacter cloacae complex NOT DETECTED NOT DETECTED Final   Escherichia coli DETECTED (A) NOT DETECTED Final    Comment: CRITICAL RESULT CALLED TO, READ BACK BY AND VERIFIED WITH: T MAKHLOUF,PHARMD AT 9604 01/03/18 BY L BENFIELD    Klebsiella oxytoca NOT DETECTED NOT DETECTED Final   Klebsiella pneumoniae NOT DETECTED NOT DETECTED Final   Proteus species NOT DETECTED NOT DETECTED Final   Serratia marcescens NOT DETECTED NOT DETECTED Final   Carbapenem resistance NOT DETECTED NOT DETECTED Final   Haemophilus influenzae NOT DETECTED NOT DETECTED Final   Neisseria meningitidis NOT DETECTED NOT DETECTED Final   Pseudomonas aeruginosa NOT DETECTED NOT DETECTED Final   Candida albicans NOT DETECTED NOT DETECTED Final   Candida glabrata NOT DETECTED NOT DETECTED Final   Candida krusei NOT DETECTED NOT DETECTED Final   Candida parapsilosis NOT DETECTED NOT DETECTED Final   Candida tropicalis NOT DETECTED NOT DETECTED Final    Comment: Performed at [facility], [address] [place], [place] [address]     Labs: BNP (last 3 results) No results for input(s): BNP in the last 8760 hours. Basic Metabolic Panel: Recent Labs  Lab 01/03/18 0712 01/04/18 0635 01/05/18 0648 01/06/18 0327 01/07/18 0445  NA 144 143 143 141 142  K 3.4* 3.4* 3.5 3.1* 3.5  CL 113* 112* 113* 109 109  CO2 22 21* 21* 23 22  GLUCOSE 98 125* 106* 99 99  BUN 16 10 6* 8 5*  CREATININE 1.46* 1.27* 1.07* 1.06* 0.91  CALCIUM 7.9* 8.3* 8.5* 8.4* 8.7*  MG  --   --  1.5* 2.0  --    Liver Function Tests: Recent Labs  Lab 01/02/18 1638 01/04/18 0635  AST 26 33  ALT 12 14  ALKPHOS 75 69  BILITOT 1.0 0.9  PROT 7.7 6.4*  ALBUMIN 3.0* 2.3*   No results for input(s): LIPASE, AMYLASE in the last  168 hours. No results for input(s): AMMONIA in the last 168 hours. CBC: Recent Labs  Lab 01/02/18 1638 01/03/18 0712 01/04/18 0635 01/05/18 0648  01/07/18 0445  WBC 22.9* 23.4* 12.9* 10.5 10.8*  HGB 12.8 11.0* 10.9* 11.9* 12.0  HCT 41.6 36.1 34.5* 38.1 37.4  MCV 94.5 93.5 91.8 91.4 89.0  PLT 245 167 168 195 244   Cardiac Enzymes: No results for input(s): CKTOTAL, CKMB, CKMBINDEX, TROPONINI in the last 168 hours. BNP: Invalid input(s): POCBNP CBG: Recent Labs  Lab 01/02/18 1721  GLUCAP 129*   D-Dimer No results for input(s): DDIMER in the last 72 hours. Hgb A1c No results for input(s): HGBA1C in the last 72 hours. Lipid Profile No results for input(s): CHOL, HDL, LDLCALC, TRIG, CHOLHDL, LDLDIRECT in the last 72 hours. Thyroid function studies No results for input(s): TSH, T4TOTAL, T3FREE, THYROIDAB in the last 72 hours.  Invalid input(s): FREET3 Anemia work up No results for input(s): VITAMINB12, FOLATE, FERRITIN, TIBC, IRON, RETICCTPCT in the last 72 hours. Urinalysis    Component Value Date/Time   COLORURINE YELLOW 01/02/2018 1751   APPEARANCEUR CLOUDY (A) 01/02/2018 1751   APPEARANCEUR Cloudy (A) 06/01/2015 1225   LABSPEC 1.016 01/02/2018 1751   PHURINE 5.0 01/02/2018 1751   GLUCOSEU NEGATIVE 01/02/2018 1751   HGBUR SMALL (A) 01/02/2018 1751   BILIRUBINUR NEGATIVE 01/02/2018 1751   BILIRUBINUR Neg 11/11/2017 1500   BILIRUBINUR Negative 06/01/2015 1225   KETONESUR NEGATIVE 01/02/2018 1751   PROTEINUR 100 (A) 01/02/2018 1751   UROBILINOGEN 0.2 11/11/2017 1500   UROBILINOGEN 0.2 10/26/2010 1137   NITRITE NEGATIVE 01/02/2018 1751   LEUKOCYTESUR LARGE (A) 01/02/2018 1751   LEUKOCYTESUR Trace (A) 06/01/2015 1225   Sepsis Labs Invalid input(s): PROCALCITONIN,  WBC,  LACTICIDVEN Microbiology Recent Results (from the past 240 hour(s))  Urine Culture     Status: Abnormal   Collection Time: 01/02/18  5:53 PM  Result Value Ref Range Status   Specimen Description URINE, RANDOM  Final   Special Requests   Final    NONE Performed at [facility], [address], [place], [place] [address]    Culture  >=100,000 COLONIES/mL ESCHERICHIA COLI (A)  Final   Report Status 01/06/2018 FINAL  Final   Organism ID, Bacteria ESCHERICHIA COLI (A)  Final      Susceptibility   Escherichia coli - MIC*    AMPICILLIN <=2 SENSITIVE Sensitive     CEFAZOLIN <=4 SENSITIVE Sensitive     CEFTRIAXONE <=1 SENSITIVE Sensitive     CIPROFLOXACIN <=0.25 SENSITIVE Sensitive     GENTAMICIN <=1 SENSITIVE Sensitive     IMIPENEM <=0.25 SENSITIVE Sensitive     NITROFURANTOIN <=16 SENSITIVE Sensitive     TRIMETH/SULFA <=20 SENSITIVE Sensitive     AMPICILLIN/SULBACTAM <=2 SENSITIVE Sensitive     PIP/TAZO <=4 SENSITIVE Sensitive     Extended ESBL NEGATIVE Sensitive     * >=100,000 COLONIES/mL ESCHERICHIA COLI  Blood Culture (routine x 2)     Status: None   Collection Time: 01/02/18  6:45 PM  Result Value Ref Range Status   Specimen Description BLOOD LEFT HAND  Final   Special Requests   Final    BOTTLES DRAWN AEROBIC AND ANAEROBIC Blood Culture results may not be optimal due to an inadequate volume of blood received in culture bottles   Culture   Final    NO GROWTH 5 DAYS Performed at [facility], [address], [place], [place] [address]    Report Status  01/07/2018 FINAL  Final  Blood Culture (routine x 2)     Status: Abnormal   Collection Time: 01/02/18  7:36 PM  Result Value Ref Range Status   Specimen Description BLOOD RIGHT HAND  Final   Special Requests   Final    BOTTLES DRAWN AEROBIC ONLY Blood Culture adequate volume   Culture  Setup Time   Final    GRAM NEGATIVE RODS AEROBIC BOTTLE ONLY CRITICAL RESULT CALLED TO, READ BACK BY AND VERIFIED WITH: T MAKHLOUF,PHARMD AT 1610 01/03/18 BY L BENFIELD Performed at [facility], [address], [place], [place] [address]    Culture ESCHERICHIA COLI (A)  Final   Report Status 01/05/2018 FINAL  Final   Organism ID, Bacteria ESCHERICHIA COLI  Final      Susceptibility   Escherichia coli - MIC*    AMPICILLIN <=2 SENSITIVE Sensitive     CEFAZOLIN  <=4 SENSITIVE Sensitive     CEFEPIME <=1 SENSITIVE Sensitive     CEFTAZIDIME <=1 SENSITIVE Sensitive     CEFTRIAXONE <=1 SENSITIVE Sensitive     CIPROFLOXACIN <=0.25 SENSITIVE Sensitive     GENTAMICIN <=1 SENSITIVE Sensitive     IMIPENEM <=0.25 SENSITIVE Sensitive     TRIMETH/SULFA <=20 SENSITIVE Sensitive     AMPICILLIN/SULBACTAM <=2 SENSITIVE Sensitive     PIP/TAZO <=4 SENSITIVE Sensitive     Extended ESBL NEGATIVE Sensitive     * ESCHERICHIA COLI  Blood Culture ID Panel (Reflexed)     Status: Abnormal   Collection Time: 01/02/18  7:36 PM  Result Value Ref Range Status   Enterococcus species NOT DETECTED NOT DETECTED Final   Listeria monocytogenes NOT DETECTED NOT DETECTED Final   Staphylococcus species NOT DETECTED NOT DETECTED Final   Staphylococcus aureus NOT DETECTED NOT DETECTED Final   Streptococcus species NOT DETECTED NOT DETECTED Final   Streptococcus agalactiae NOT DETECTED NOT DETECTED Final   Streptococcus pneumoniae NOT DETECTED NOT DETECTED Final   Streptococcus pyogenes NOT DETECTED NOT DETECTED Final   Acinetobacter baumannii NOT DETECTED NOT DETECTED Final   Enterobacteriaceae species DETECTED (A) NOT DETECTED Final    Comment: Enterobacteriaceae represent a large family of gram-negative bacteria, not a single organism. CRITICAL RESULT CALLED TO, READ BACK BY AND VERIFIED WITH: T MAKHLOUF,PHARMD AT 4098 01/03/18 BY L BENFIELD    Enterobacter cloacae complex NOT DETECTED NOT DETECTED Final   Escherichia coli DETECTED (A) NOT DETECTED Final    Comment: CRITICAL RESULT CALLED TO, READ BACK BY AND VERIFIED WITH: T MAKHLOUF,PHARMD AT 1191 01/03/18 BY L BENFIELD    Klebsiella oxytoca NOT DETECTED NOT DETECTED Final   Klebsiella pneumoniae NOT DETECTED NOT DETECTED Final   Proteus species NOT DETECTED NOT DETECTED Final   Serratia marcescens NOT DETECTED NOT DETECTED Final   Carbapenem resistance NOT DETECTED NOT DETECTED Final   Haemophilus influenzae NOT DETECTED  NOT DETECTED Final   Neisseria meningitidis NOT DETECTED NOT DETECTED Final   Pseudomonas aeruginosa NOT DETECTED NOT DETECTED Final   Candida albicans NOT DETECTED NOT DETECTED Final   Candida glabrata NOT DETECTED NOT DETECTED Final   Candida krusei NOT DETECTED NOT DETECTED Final   Candida parapsilosis NOT DETECTED NOT DETECTED Final   Candida tropicalis NOT DETECTED NOT DETECTED Final    Comment: Performed at [facility], [address], [place], [place] [address]     Time coordinating discharge: 35  minutes  SIGNED:   Kathlen Mody, MD  [facility] 01/08/2018, 8:18 AM Pager  If 7PM-7AM, please contact night-coverage [email or web address] Password [email or web address]

## 2018-01-10 DIAGNOSIS — F320 Major depressive disorder, single episode, mild: Secondary | ICD-10-CM

## 2018-01-10 DIAGNOSIS — G309 Alzheimer's disease, unspecified: Secondary | ICD-10-CM

## 2018-01-10 DIAGNOSIS — I69311 Memory deficit following cerebral infarction: Secondary | ICD-10-CM

## 2018-01-10 DIAGNOSIS — N390 Urinary tract infection, site not specified: Secondary | ICD-10-CM

## 2018-01-10 DIAGNOSIS — I10 Essential (primary) hypertension: Secondary | ICD-10-CM

## 2018-01-10 DIAGNOSIS — M6281 Muscle weakness (generalized): Secondary | ICD-10-CM

## 2018-01-10 DIAGNOSIS — F0280 Dementia in other diseases classified elsewhere without behavioral disturbance: Secondary | ICD-10-CM

## 2018-01-10 DIAGNOSIS — F0150 Vascular dementia without behavioral disturbance: Secondary | ICD-10-CM

## 2018-01-26 ENCOUNTER — Encounter: Payer: Self-pay | Admitting: Internal Medicine

## 2018-01-26 ENCOUNTER — Ambulatory Visit: Payer: Medicare Other | Admitting: Internal Medicine

## 2018-01-26 VITALS — BP 132/84 | HR 71 | Temp 98.7°F | Wt 163.0 lb

## 2018-01-26 DIAGNOSIS — R4182 Altered mental status, unspecified: Secondary | ICD-10-CM

## 2018-01-26 DIAGNOSIS — Z87440 Personal history of urinary (tract) infections: Secondary | ICD-10-CM | POA: Diagnosis not present

## 2018-01-26 DIAGNOSIS — R5383 Other fatigue: Secondary | ICD-10-CM | POA: Diagnosis not present

## 2018-01-26 DIAGNOSIS — F29 Unspecified psychosis not due to a substance or known physiological condition: Secondary | ICD-10-CM

## 2018-01-26 DIAGNOSIS — G309 Alzheimer's disease, unspecified: Secondary | ICD-10-CM | POA: Diagnosis not present

## 2018-01-26 DIAGNOSIS — F0280 Dementia in other diseases classified elsewhere without behavioral disturbance: Secondary | ICD-10-CM

## 2018-01-26 DIAGNOSIS — F0150 Vascular dementia without behavioral disturbance: Secondary | ICD-10-CM

## 2018-01-26 NOTE — Progress Notes (Signed)
Patient ID: Carol [name], female   DOB: [birth year], 82 y.o.   MRN: [id number]   Location:  [facility] OFFICE  Provider: DR Arletha Grippe  Code Status:  Goals of Care:  Advanced Directives 01/26/2018  Does Patient Have a Medical Advance Directive? Yes  Type of Advance Directive [place]  Does patient want to make changes to medical advance directive? -  Copy of [place] in Chart? No - copy requested  Would patient like information on creating a medical advance directive? -     Chief Complaint  Patient presents with  . Follow-up    Pt is being seen for a 2 month follow up on dementia and psychosis.     HPI: Patient is a 82 y.o. female seen today for medical management of chronic diseases.  Pt was admitted to the hospital towards the end of July for sepsis 2/2 E coli UTI. She completed abx last week but family notes her cognition is worse. They have had to use adult briefs on her prior to hospital admission due to frequent accidents. They continued using briefs when she was d/c'd and pt had started using restroom again. However, pt has had 1 episode of bladder incontinence in last few days. Appetite poor. Pt has lost 20 lbs since June 2019 but did gain 2 lbs since hospital d/c. She does not exercise as much. Memory is worse. Family really c/a her gradual but steady decline and are interested in next steps for her. Pt is a poor historian due to dementia. Hx obtained from daughters and chart. Urine cx showed pansensitive E coli; BC NGTD; WBC 22.9K-->10.8K; Hgb 12; Cr 1.65-->0.91; K dropped 3.1-->3.5; Mg dropped 1.5-->2.0; albumin 2.3 at d/c.  Pt was able to visit her spouse this past weekend.  Overweight - improved 2/2 wt loss. BMI  25.52. She is eating less [employer].   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 Dementia progressively worsening although MMSE 20/30. She continues to have paranoia and has significant issues with  short term memory. Family has private duty care 2 times per week (Mon/Fri) where she gets bath and washes hair. [employer]whe[employer] provide 3 meals per day. No longer drives. She is hording paper towels, toilet paper, water and laundry detergent. She is taking namzaric and has exelon patch. Pt is losing wt which is an expected outcome of end stage dementia. Albumin 2.3  Cutaneous candidiasis - stable. She is using nystatin powder daily. She was tx with po diflucan and flagyl in the past.  Hx Tobacco abuse - she has stopped smoking    Past Medical History:  Diagnosis Date  . Anxiety state, unspecified   . Cataract   . Depressive disorder, not elsewhere classified   . Lumbago   . Memory loss   . Other and unspecified hyperlipidemia   . Rickets, late effect   . Tobacco use disorder   . Unspecified constipation   . Unspecified essential hypertension   . Varicose veins of lower extremities     Past Surgical History:  Procedure Laterality Date  . CYST EXCISION Bilateral    behind ears  . MOUTH SURGERY  04/12/2014     reports that she quit smoking about 8 months ago. She quit after 55.00 years of use. She has never used smokeless tobacco. She reports that she does not drink alcohol or use drugs. Social History   Socioeconomic History  . Marital status: Married    Spouse name: Not on file  .  Number of children: Not on file  . Years of education: Not on file  . Highest education level: Not on file  Occupational History  . Not on file  Social Needs  . Financial resource strain: Not hard at all  . Food insecurity:    Worry: Never true    Inability: Never true  . Transportation needs:    Medical: No    Non-medical: No  Tobacco Use  . Smoking status: Former Smoker    Years: 55.00    Last attempt to quit: 05/09/2017    Years since quitting: 0.7  . Smokeless tobacco: Never Used  Substance and Sexual Activity  . Alcohol use: No    Alcohol/week: 0.0 standard drinks  . Drug use: No    . Sexual activity: Not on file  Lifestyle  . Physical activity:    Days per week: 0 days    Minutes per session: 0 min  . Stress: Not on file  Relationships  . Social connections:    Talks on phone: More than three times a week    Gets together: More than three times a week    Attends religious service: More than 4 times per year    Active member of club or organization: No    Attends meetings of clubs or organizations: Never    Relationship status: Married  . Intimate partner violence:    Fear of current or ex partner: No    Emotionally abused: No    Physically abused: No    Forced sexual activity: No  Other Topics Concern  . Not on file  Social History Narrative  . Not on file    Family History  Problem Relation Age of Onset  . Heart disease Father     Allergies  Allergen Reactions  . Penicillins Swelling and Rash    Tolerate ceftriaxone    Outpatient Encounter Medications as of 01/26/2018  Medication Sig  . acetaminophen (TYLENOL) 325 MG tablet Take 325 mg by mouth daily as needed (pain).  Marland Kitchen alendronate (FOSAMAX) 70 MG tablet TAKE 1 TABLET(70 MG) BY MOUTH EVERY 7 DAYS WITH A FULL GLASS OF WATER AND ON AN EMPTY STOMACH  . amLODipine (NORVASC) 5 MG tablet Take 1 tablet (5 mg total) by mouth daily.  Marland Kitchen atorvastatin (LIPITOR) 20 MG tablet TAKE 1 TABLET BY MOUTH EVERY DAY FOR CHOLESTEROL  . Memantine HCl-Donepezil HCl (NAMZARIC) 28-10 MG CP24 Take 1 capsule by mouth daily.  . Multiple Vitamins-Minerals (MULTIVITAMIN WITH MINERALS) tablet Take 1 tablet by mouth daily with supper. Centrum Silver  . nystatin (MYCOSTATIN/NYSTOP) powder Apply topically 2 (two) times daily. To groin and abdomen area  . QUEtiapine (SEROQUEL) 100 MG tablet TAKE 1 TABLET BY MOUTH EVERY MORNING AND 1 TABLET AT BEDTIME  . rivastigmine (EXELON) 13.3 MG/24HR Place 1 patch (13.3 mg total) onto the skin daily.   No facility-administered encounter medications on file as of 01/26/2018.     Review of  Systems:  Review of Systems  Unable to perform ROS: Dementia    Health Maintenance  Topic Date Due  . INFLUENZA VACCINE  01/07/2018  . TETANUS/TDAP  06/08/2024  . DEXA SCAN  Completed  . PNA vac Low Risk Adult  Completed    Physical Exam: Vitals:   01/26/18 1538  BP: 132/84  Pulse: 71  Temp: 98.7 F (37.1 C)  TempSrc: Oral  SpO2: 96%  Weight: 163 lb (73.9 kg)   Body mass index is 25.53 kg/m. Physical Exam  Constitutional: She appears well-developed and well-nourished.  HENT:  Mouth/Throat: Oropharynx is clear and moist. No oropharyngeal exudate.  Poor dentition; MMM; no oral thrush  Eyes: Pupils are equal, round, and reactive to light. No scleral icterus.  Neck: Neck supple. Carotid bruit is not present. No tracheal deviation present. No thyromegaly present.  Cardiovascular: Normal rate, regular rhythm and intact distal pulses. Exam reveals no gallop and no friction rub.  Murmur (1/6 SEM) heard. No LE edema b/l. no calf TTP.   Pulmonary/Chest: Effort normal and breath sounds normal. No stridor. No respiratory distress. She has no wheezes. She has no rales.  Abdominal: Soft. Normal appearance and bowel sounds are normal. She exhibits no distension and no mass. There is no hepatomegaly. There is no tenderness. There is no rigidity, no rebound and no guarding. No hernia.  Musculoskeletal: She exhibits edema.  Lymphadenopathy:    She has no cervical adenopathy.  Neurological: She is alert. She has normal reflexes.  Skin: Skin is warm and dry. No rash noted.  Psychiatric: She has a normal mood and affect. Her behavior is normal.    Labs reviewed: Basic Metabolic Panel: Recent Labs    09/14/17 0929  01/05/18 0648 01/06/18 0327 01/07/18 0445  NA 143   < > 143 141 142  K 4.3   < > 3.5 3.1* 3.5  CL 108   < > 113* 109 109  CO2 27   < > 21* 23 22  GLUCOSE 103*   < > 106* 99 99  BUN 16   < > 6* 8 5*  CREATININE 0.92   < > 1.07* 1.06* 0.91  CALCIUM 9.6   < > 8.5* 8.4*  8.7*  MG  --   --  1.5* 2.0  --   TSH 1.56  --   --   --   --    < > = values in this interval not displayed.   Liver Function Tests: Recent Labs    09/14/17 0929 01/02/18 1638 01/04/18 0635  AST 10 26 33  ALT 4* 12 14  ALKPHOS  --  75 69  BILITOT 0.6 1.0 0.9  PROT 7.7 7.7 6.4*  ALBUMIN  --  3.0* 2.3*   No results for input(s): LIPASE, AMYLASE in the last 8760 hours. No results for input(s): AMMONIA in the last 8760 hours. CBC: Recent Labs    11/11/17 1517  01/04/18 0635 01/05/18 0648 01/07/18 0445  WBC 9.2   < > 12.9* 10.5 10.8*  NEUTROABS 5,180  --   --   --   --   HGB 13.0   < > 10.9* 11.9* 12.0  HCT 38.9   < > 34.5* 38.1 37.4  MCV 86.1   < > 91.8 91.4 89.0  PLT 205   < > 168 195 244   < > = values in this interval not displayed.   Lipid Panel: Recent Labs    09/14/17 0929  CHOL 174  HDL 67  LDLCALC 94  TRIG 45  CHOLHDL 2.6   Lab Results  Component Value Date   HGBA1C 6.1 (H) 11/11/2017    Procedures since last visit: Dg Chest 2 View  Result Date: 01/02/2018 CLINICAL DATA:  Fever.  Altered mental status. EXAM: CHEST - 2 VIEW COMPARISON:  Chest radiograph 09/23/2009, CT 09/16/2009 FINDINGS: Lower lung volumes from prior exam. The heart is enlarged. Diffuse aortic tortuosity including prominence of the aortic arch. Diffusely increased interstitial opacities which may be pulmonary edema, atypical  infection or bronchovascular crowding. Patchy retrocardiac and peripheral basilar opacities. Possible tiny pleural effusions. No pneumothorax. IMPRESSION: 1. Cardiomegaly with tortuous atherosclerotic aorta. Aortic arch tortuosity is more prominent than prior exam, likely secondary to differences in technique. Recommend follow-up PA and lateral views when patient is able to take better inspiration. 2. Diffusely increased interstitial opacities may represent pulmonary edema, atypical infection, or less likely bronchovascular crowding secondary to low lung volumes. 3.  Patchy retrocardiac and peripheral bibasilar opacities, pneumonia or aspiration is considered in the setting of fever. Electronically Signed   By: Jeb Levering M.D.   On: 01/02/2018 21:25   Ct Head Wo Contrast  Result Date: 01/02/2018 CLINICAL DATA:  82 year old female with altered mental status. EXAM: CT HEAD WITHOUT CONTRAST TECHNIQUE: Contiguous axial images were obtained from the base of the skull through the vertex without intravenous contrast. COMPARISON:  05/17/2015 and prior CTs FINDINGS: Brain: No evidence of acute infarction, hemorrhage, hydrocephalus, extra-axial collection or mass lesion/mass effect. Atrophy and chronic small-vessel white matter ischemic changes noted. Vascular: Intracranial atherosclerotic calcifications noted periods Skull: Normal. Negative for fracture or focal lesion. Sinuses/Orbits: No acute finding. Chronic RIGHT maxillary sinus mucoperiosteal thickening noted. Other: None. IMPRESSION: 1. No evidence of acute intracranial abnormality 2. Atrophy and chronic small-vessel white matter ischemic changes. Electronically Signed   By: Margarette Canada M.D.   On: 01/02/2018 19:20    Assessment/Plan   ICD-10-CM   1. Altered mental status, unspecified altered mental status type R41.82 Urinalysis with Reflex Microscopic    Culture, Urine    CBC with Differential/Platelets    BMP with eGFR([facility])    Culture, Urine    Urinalysis with Reflex Microscopic  2. Fatigue, unspecified type R53.83 Urinalysis with Reflex Microscopic    Culture, Urine    CBC with Differential/Platelets    BMP with eGFR([facility])    Culture, Urine    Urinalysis with Reflex Microscopic  3. History of UTI Z87.440 Urinalysis with Reflex Microscopic    Culture, Urine    Culture, Urine    Urinalysis with Reflex Microscopic  4. Mixed Alzheimer's and vascular dementia - likely end stage G30.9    F01.50    F02.80   5. Psychosis, unspecified psychosis type ([place]) F29    Will call with lab results  Continue  current medications as ordered  [place] - will have social work follow up with family through [facility]  Continue home health PT as ordered  Follow up in 6 weeks with Dr Gildardo Cranker S. Perlie Gold  [facility] and [facility] [address] [place], [place] [address] [phone] Cell (Monday-Friday 8 AM - 5 PM) [phone] After 5 PM and follow prompts

## 2018-01-26 NOTE — Patient Instructions (Signed)
Will call with lab results  Continue current medications as ordered  PLEASE CONSIDER PLACEMENT INTO ASSISTED LIVING VS LONG TERM CARE - will have social work follow up with family through [facility]  Continue home health PT as ordered  Follow up in 6 weeks with Dr Montez Moritaarter

## 2018-01-27 ENCOUNTER — Encounter: Payer: Self-pay | Admitting: Internal Medicine

## 2018-01-27 LAB — URINALYSIS, ROUTINE W REFLEX MICROSCOPIC
BACTERIA UA: NONE SEEN /HPF
Bilirubin Urine: NEGATIVE
Glucose, UA: NEGATIVE
Hgb urine dipstick: NEGATIVE
Ketones, ur: NEGATIVE
Nitrite: NEGATIVE
RBC / HPF: NONE SEEN /HPF (ref 0–2)
SPECIFIC GRAVITY, URINE: 1.017 (ref 1.001–1.03)
pH: 6 (ref 5.0–8.0)

## 2018-01-27 LAB — BASIC METABOLIC PANEL WITH GFR
BUN / CREAT RATIO: 9 (calc) (ref 6–22)
BUN: 11 mg/dL (ref 7–25)
CO2: 28 mmol/L (ref 20–32)
CREATININE: 1.17 mg/dL — AB (ref 0.60–0.88)
Calcium: 9.1 mg/dL (ref 8.6–10.4)
Chloride: 102 mmol/L (ref 98–110)
GFR, Est African American: 51 mL/min/{1.73_m2} — ABNORMAL LOW (ref >=60)
GFR, Est Non African American: 44 mL/min/{1.73_m2} — ABNORMAL LOW (ref >=60)
Glucose, Bld: 116 mg/dL (ref 65–139)
POTASSIUM: 3.6 mmol/L (ref 3.5–5.3)
SODIUM: 144 mmol/L (ref 135–146)

## 2018-01-27 LAB — CBC WITH DIFFERENTIAL/PLATELET
BASOS ABS: 37 {cells}/uL (ref 0–200)
Basophils Relative: 0.3 %
EOS ABS: 244 {cells}/uL (ref 15–500)
EOS PCT: 2 %
HCT: 38.7 % (ref 35.0–45.0)
HEMOGLOBIN: 12.6 g/dL (ref 11.7–15.5)
Lymphs Abs: 3538 cells/uL (ref 850–3900)
MCH: 28.1 pg (ref 27.0–33.0)
MCHC: 32.6 g/dL (ref 32.0–36.0)
MCV: 86.4 fL (ref 80.0–100.0)
MPV: 12.2 fL (ref 7.5–12.5)
Monocytes Relative: 5.9 %
NEUTROS ABS: 7662 {cells}/uL (ref 1500–7800)
NEUTROS PCT: 62.8 %
Platelets: 305 10*3/uL (ref 140–400)
RBC: 4.48 10*6/uL (ref 3.80–5.10)
RDW: 13 % (ref 11.0–15.0)
Total Lymphocyte: 29 %
WBC mixed population: 720 cells/uL (ref 200–950)
WBC: 12.2 10*3/uL — ABNORMAL HIGH (ref 3.8–10.8)

## 2018-01-27 LAB — URINE CULTURE
MICRO NUMBER: 90992172
SPECIMEN QUALITY: ADEQUATE

## 2018-02-15 ENCOUNTER — Other Ambulatory Visit: Payer: Self-pay | Admitting: Internal Medicine

## 2018-03-10 ENCOUNTER — Ambulatory Visit (INDEPENDENT_AMBULATORY_CARE_PROVIDER_SITE_OTHER): Payer: Medicare Other | Admitting: Internal Medicine

## 2018-03-10 ENCOUNTER — Encounter: Payer: Self-pay | Admitting: Internal Medicine

## 2018-03-10 VITALS — BP 136/84 | HR 94 | Temp 98.2°F | Ht 67.0 in | Wt 184.2 lb

## 2018-03-10 DIAGNOSIS — G309 Alzheimer's disease, unspecified: Secondary | ICD-10-CM

## 2018-03-10 DIAGNOSIS — R197 Diarrhea, unspecified: Secondary | ICD-10-CM | POA: Diagnosis not present

## 2018-03-10 DIAGNOSIS — F0150 Vascular dementia without behavioral disturbance: Secondary | ICD-10-CM

## 2018-03-10 DIAGNOSIS — I10 Essential (primary) hypertension: Secondary | ICD-10-CM

## 2018-03-10 DIAGNOSIS — B372 Candidiasis of skin and nail: Secondary | ICD-10-CM | POA: Insufficient documentation

## 2018-03-10 DIAGNOSIS — F0280 Dementia in other diseases classified elsewhere without behavioral disturbance: Secondary | ICD-10-CM

## 2018-03-10 LAB — CBC WITH DIFFERENTIAL/PLATELET
BASOS ABS: 34 {cells}/uL (ref 0–200)
Basophils Relative: 0.4 %
Eosinophils Absolute: 151 cells/uL (ref 15–500)
Eosinophils Relative: 1.8 %
HEMATOCRIT: 37.2 % (ref 35.0–45.0)
Hemoglobin: 12.4 g/dL (ref 11.7–15.5)
LYMPHS ABS: 3276 {cells}/uL (ref 850–3900)
MCH: 28.7 pg (ref 27.0–33.0)
MCHC: 33.3 g/dL (ref 32.0–36.0)
MCV: 86.1 fL (ref 80.0–100.0)
MPV: 11.6 fL (ref 7.5–12.5)
Monocytes Relative: 5.4 %
NEUTROS PCT: 53.4 %
Neutro Abs: 4486 cells/uL (ref 1500–7800)
Platelets: 238 10*3/uL (ref 140–400)
RBC: 4.32 10*6/uL (ref 3.80–5.10)
RDW: 13.5 % (ref 11.0–15.0)
Total Lymphocyte: 39 %
WBC: 8.4 10*3/uL (ref 3.8–10.8)
WBCMIX: 454 {cells}/uL (ref 200–950)

## 2018-03-10 LAB — COMPLETE METABOLIC PANEL WITH GFR
AG RATIO: 0.9 (calc) — AB (ref 1.0–2.5)
ALKALINE PHOSPHATASE (APISO): 70 U/L (ref 33–130)
ALT: 8 U/L (ref 6–29)
AST: 14 U/L (ref 10–35)
Albumin: 3.7 g/dL (ref 3.6–5.1)
BUN/Creatinine Ratio: 9 (calc) (ref 6–22)
BUN: 11 mg/dL (ref 7–25)
CO2: 24 mmol/L (ref 20–32)
Calcium: 9.3 mg/dL (ref 8.6–10.4)
Chloride: 105 mmol/L (ref 98–110)
Creat: 1.18 mg/dL — ABNORMAL HIGH (ref 0.60–0.88)
GFR, EST NON AFRICAN AMERICAN: 44 mL/min/{1.73_m2} — AB (ref >=60)
GFR, Est African American: 50 mL/min/{1.73_m2} — ABNORMAL LOW (ref >=60)
GLOBULIN: 3.9 g/dL — AB (ref 1.9–3.7)
Glucose, Bld: 155 mg/dL — ABNORMAL HIGH (ref 65–139)
POTASSIUM: 3.8 mmol/L (ref 3.5–5.3)
SODIUM: 139 mmol/L (ref 135–146)
Total Bilirubin: 0.5 mg/dL (ref 0.2–1.2)
Total Protein: 7.6 g/dL (ref 6.1–8.1)

## 2018-03-10 MED ORDER — AMLODIPINE BESYLATE 5 MG PO TABS: 5.0000 mg | ORAL_TABLET | Freq: Every day | ORAL | 3 refills | Status: DC

## 2018-03-10 MED ORDER — NYSTATIN 100000 UNIT/GM EX POWD: Freq: Two times a day (BID) | CUTANEOUS | 1 refills | Status: DC

## 2018-03-10 MED ORDER — FIDAXOMICIN 200 MG PO TABS: 200.0000 mg | ORAL_TABLET | Freq: Two times a day (BID) | ORAL | 0 refills | Status: DC

## 2018-03-10 NOTE — Patient Instructions (Addendum)
START DIFICID 200MG  2 TIMES DAILY X 10 DAYS FOR DIARRHEA INFECTION  CONTINUE CULTURELLE DAILY FOR PROBIOTIC  Push fluids and rest  Continue other medications as ordered  Will call with lab results  Follow up in 2 weeks for diarrhea or sooner if need be. Go to the ER if symptoms worsen

## 2018-03-10 NOTE — Progress Notes (Signed)
Patient ID: Carol [name], female   DOB: [birth year], 82 y.o.   MRN: [id number]   Location:  [facility] OFFICE  Provider: DR Arletha Grippe  Code Status:  Goals of Care:  Advanced Directives 01/26/2018  Does Patient Have a Medical Advance Directive? Yes  Type of Advance Directive [place]  Does patient want to make changes to medical advance directive? -  Copy of [place] in Chart? No - copy requested  Would patient like information on creating a medical advance directive? -     Chief Complaint  Patient presents with  . Medical Management of Chronic Issues    6 Week follow up for altered mental status and fatigue    HPI: Patient is a 82 y.o. female seen today for medical management of chronic diseases.  Pt with increased explosive, watery, foul smelling diarrhea since d/x from hospital in August 2019. She was tx for sepsis 2/2 UTI and completed abx ceftin. Pt with increased sleeping, fatigue and increased LE swelling. Appetite improved and she apparently has regained the weight she lost prior to hospitalization (approx 20 lbs). Pt is a poor historian due to dementia. Hx obtained from chart.  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 She has no regard for social norms and says inappropriate things (yesterday, she told her dentist that he was so big that he was 2 people). Dementia progressively worsening although MMSE 20/30. She continues to have paranoia and has significant issues with short term memory. Family has hired private duty care to begin day and night care 7 days per week to start next week. Current care is 2 times per week (Mon/Fri) where she gets bath and washes hair. [employer]-on-wheels provide 3 meals per day. No longer drives. She is hording paper towels, toilet paper, water and laundry detergent. She is taking namzaric and has exelon patch. Pt  Lost wt prior to July hospital admission but has regained the weight since  d/c. Albumin 2.3  Cutaneous candidiasis - uncomtrolled. She is using nystatin powder daily. Needs RF. She was tx with po diflucan and flagyl in the past.  Hx Tobacco abuse - she has stopped smoking   Past Medical History:  Diagnosis Date  . Anxiety state, unspecified   . Cataract   . Depressive disorder, not elsewhere classified   . Lumbago   . Memory loss   . Other and unspecified hyperlipidemia   . Rickets, late effect   . Tobacco use disorder   . Unspecified constipation   . Unspecified essential hypertension   . Varicose veins of lower extremities     Past Surgical History:  Procedure Laterality Date  . CYST EXCISION Bilateral    behind ears  . MOUTH SURGERY  04/12/2014     reports that she quit smoking about 10 months ago. She quit after 55.00 years of use. She has never used smokeless tobacco. She reports that she does not drink alcohol or use drugs. Social History   Socioeconomic History  . Marital status: Married    Spouse name: Not on file  . Number of children: Not on file  . Years of education: Not on file  . Highest education level: Not on file  Occupational History  . Not on file  Social Needs  . Financial resource strain: Not hard at all  . Food insecurity:    Worry: Never true    Inability: Never true  . Transportation needs:    Medical: No  Non-medical: No  Tobacco Use  . Smoking status: Former Smoker    Years: 55.00    Last attempt to quit: 05/09/2017    Years since quitting: 0.8  . Smokeless tobacco: Never Used  Substance and Sexual Activity  . Alcohol use: No    Alcohol/week: 0.0 standard drinks  . Drug use: No  . Sexual activity: Not on file  Lifestyle  . Physical activity:    Days per week: 0 days    Minutes per session: 0 min  . Stress: Not on file  Relationships  . Social connections:    Talks on phone: More than three times a week    Gets together: More than three times a week    Attends religious service: More than 4 times  per year    Active member of club or organization: No    Attends meetings of clubs or organizations: Never    Relationship status: Married  . Intimate partner violence:    Fear of current or ex partner: No    Emotionally abused: No    Physically abused: No    Forced sexual activity: No  Other Topics Concern  . Not on file  Social History Narrative  . Not on file    Family History  Problem Relation Age of Onset  . Heart disease Father     Allergies  Allergen Reactions  . Penicillins Swelling and Rash    Tolerate ceftriaxone    Outpatient Encounter Medications as of 03/10/2018  Medication Sig  . acetaminophen (TYLENOL) 325 MG tablet Take 325 mg by mouth daily as needed (pain).  . alendronate (FOSAMAX) 70 MG tablet TAKE 1 TABLET(70 MG) BY MOUTH EVERY 7 DAYS WITH A FULL GLASS OF WATER AND ON AN EMPTY STOMACH  . amLODipine (NORVASC) 5 MG tablet Take 1 tablet (5 mg total) by mouth daily.  . amLODipine (NORVASC) 5 MG tablet TAKE 1 TABLET(5 MG) BY MOUTH DAILY  . atorvastatin (LIPITOR) 20 MG tablet TAKE 1 TABLET BY MOUTH EVERY DAY FOR CHOLESTEROL  . Memantine HCl-Donepezil HCl (NAMZARIC) 28-10 MG CP24 Take 1 capsule by mouth daily.  . Multiple Vitamins-Minerals (MULTIVITAMIN WITH MINERALS) tablet Take 1 tablet by mouth daily with supper. Centrum Silver  . nystatin (MYCOSTATIN/NYSTOP) powder Apply topically 2 (two) times daily. To groin and abdomen area  . QUEtiapine (SEROQUEL) 100 MG tablet TAKE 1 TABLET BY MOUTH EVERY MORNING AND 1 TABLET AT BEDTIME  . rivastigmine (EXELON) 13.3 MG/24HR Place 1 patch (13.3 mg total) onto the skin daily.   No facility-administered encounter medications on file as of 03/10/2018.     Review of Systems:  Review of Systems  Unable to perform ROS: Dementia    Health Maintenance  Topic Date Due  . INFLUENZA VACCINE  01/07/2018  . TETANUS/TDAP  06/08/2024  . DEXA SCAN  Completed  . PNA vac Low Risk Adult  Completed    Physical Exam: Vitals:    03/10/18 1259  BP: 136/84  Pulse: 94  Temp: 98.2 F (36.8 C)  TempSrc: Oral  SpO2: 97%  Weight: 184 lb 3.2 oz (83.6 kg)  Height: 5' 7" (1.702 m)   Body mass index is 28.85 kg/m. Physical Exam  Constitutional: She appears well-developed and well-nourished. She appears lethargic.  HENT:  Mouth/Throat: Oropharynx is clear and moist. No oropharyngeal exudate.  MMM; no oral thrush  Eyes: Pupils are equal, round, and reactive to light. No scleral icterus.  Neck: Neck supple. Carotid bruit is not present. No   tracheal deviation present. No thyromegaly present.  Cardiovascular: Regular rhythm and intact distal pulses. Tachycardia present. Exam reveals no gallop and no friction rub.  Murmur heard.  Systolic murmur is present with a grade of 1/6. Trace LE edema b/l. No calf TTP  Pulmonary/Chest: Effort normal and breath sounds normal. No stridor. No respiratory distress. She has no wheezes. She has no rales.  Abdominal: Soft. Normal appearance. She exhibits distension. She exhibits no mass. Bowel sounds are decreased. There is no hepatomegaly. There is no tenderness. There is no rigidity, no rebound and no guarding. No hernia.  Musculoskeletal: She exhibits edema.  Lymphadenopathy:    She has no cervical adenopathy.  Neurological: She has normal reflexes. She appears lethargic.  Skin: Skin is warm. Rash noted.     Psychiatric: Her behavior is normal. Her affect is blunt and inappropriate. She is not actively hallucinating. Thought content is not delusional.    Labs reviewed: Basic Metabolic Panel: Recent Labs    09/14/17 0929  01/05/18 0648 01/06/18 0327 01/07/18 0445 01/26/18 1637  NA 143   < > 143 141 142 144  K 4.3   < > 3.5 3.1* 3.5 3.6  CL 108   < > 113* 109 109 102  CO2 27   < > 21* 23 22 28  GLUCOSE 103*   < > 106* 99 99 116  BUN 16   < > 6* 8 5* 11  CREATININE 0.92   < > 1.07* 1.06* 0.91 1.17*  CALCIUM 9.6   < > 8.5* 8.4* 8.7* 9.1  MG  --   --  1.5* 2.0  --   --     TSH 1.56  --   --   --   --   --    < > = values in this interval not displayed.   Liver Function Tests: Recent Labs    09/14/17 0929 01/02/18 1638 01/04/18 0635  AST 10 26 33  ALT 4* 12 14  ALKPHOS  --  75 69  BILITOT 0.6 1.0 0.9  PROT 7.7 7.7 6.4*  ALBUMIN  --  3.0* 2.3*   No results for input(s): LIPASE, AMYLASE in the last 8760 hours. No results for input(s): AMMONIA in the last 8760 hours. CBC: Recent Labs    11/11/17 1517  01/05/18 0648 01/07/18 0445 01/26/18 1637  WBC 9.2   < > 10.5 10.8* 12.2*  NEUTROABS 5,180  --   --   --  7,662  HGB 13.0   < > 11.9* 12.0 12.6  HCT 38.9   < > 38.1 37.4 38.7  MCV 86.1   < > 91.4 89.0 86.4  PLT 205   < > 195 244 305   < > = values in this interval not displayed.   Lipid Panel: Recent Labs    09/14/17 0929  CHOL 174  HDL 67  LDLCALC 94  TRIG 45  CHOLHDL 2.6   Lab Results  Component Value Date   HGBA1C 6.1 (H) 11/11/2017    Procedures since last visit: No results found.  Assessment/Plan   ICD-10-CM   1. Diarrhea of presumed infectious origin R19.7 fidaxomicin (DIFICID) 200 MG TABS tablet    Clostridium difficile Toxin B, Qualitative, Real-Time PCR([facility])    CBC with Differential/Platelets    CMP with eGFR([facility])  2. Mixed Alzheimer's and vascular dementia (HCC) G30.9    F01.50    F02.80   3. Essential hypertension, benign I10 amLODipine (NORVASC) 5 MG tablet  4.   Cutaneous candidiasis B37.2 nystatin (MYCOSTATIN/NYSTOP) powder   worse   START DIFICID 200MG 2 TIMES DAILY X 10 DAYS FOR DIARRHEA INFECTION - family does not perceive they will be able to get a viable stool sample but will try.  CONTINUE CULTURELLE DAILY FOR PROBIOTIC  Push fluids and rest  Continue other medications as ordered  Will call with lab results  Follow up in 2 weeks for diarrhea or sooner if need be. Go to the ER if symptoms worsen   Zerina Hallinan S. Perlie Gold  [facility] and [facility] [address] [place], [place] [address] [phone] Cell (Monday-Friday 8 AM - 5 PM) [phone] After 5 PM and follow prompts

## 2018-03-24 ENCOUNTER — Ambulatory Visit (INDEPENDENT_AMBULATORY_CARE_PROVIDER_SITE_OTHER): Payer: Medicare Other | Admitting: Internal Medicine

## 2018-03-24 ENCOUNTER — Encounter: Payer: Self-pay | Admitting: Internal Medicine

## 2018-03-24 VITALS — BP 110/62 | HR 89 | Temp 98.2°F | Ht 67.0 in | Wt 181.0 lb

## 2018-03-24 DIAGNOSIS — F0150 Vascular dementia without behavioral disturbance: Secondary | ICD-10-CM

## 2018-03-24 DIAGNOSIS — R5381 Other malaise: Secondary | ICD-10-CM

## 2018-03-24 DIAGNOSIS — F0280 Dementia in other diseases classified elsewhere without behavioral disturbance: Secondary | ICD-10-CM

## 2018-03-24 DIAGNOSIS — R2681 Unsteadiness on feet: Secondary | ICD-10-CM | POA: Diagnosis not present

## 2018-03-24 DIAGNOSIS — Z23 Encounter for immunization: Secondary | ICD-10-CM

## 2018-03-24 DIAGNOSIS — G309 Alzheimer's disease, unspecified: Secondary | ICD-10-CM | POA: Diagnosis not present

## 2018-03-24 DIAGNOSIS — R197 Diarrhea, unspecified: Secondary | ICD-10-CM

## 2018-03-24 DIAGNOSIS — M62830 Muscle spasm of back: Secondary | ICD-10-CM

## 2018-03-24 NOTE — Progress Notes (Signed)
Patient ID: AVARAE ZWART, female   DOB: Dec 22, 1935, 82 y.o.   MRN: [id number]   Location:  [facility] OFFICE  Provider: DR Elmon Kirschner  Code Status: Goals of Care:  Advanced Directives 01/26/2018  Does Patient Have a Medical Advance Directive? Yes  Type of Advance Directive Healthcare Power of Attorney  Does patient want to make changes to medical advance directive? -  Copy of Healthcare Power of Attorney in Chart? No - copy requested  Would patient like information on creating a medical advance directive? -     Chief Complaint  Patient presents with  . Follow-up    2 week follow-up on diarrhea. Diarrhea improved. Patient completed ABX   . Referral    Home Health PT referral request, patient with gait concerns     HPI: Patient is a 82 y.o. female seen today for f/u diarrhea. She completed flagyl on yesterday. No further diarrhea. No abdominal pain or bloating. No f/c. Weight down 3 lbs. Appetite ok and sleeps well. She is a poor historian due to dementia. Hx obtained from chart.  Family reports pt with increased unsteady gait, LE weakness. She has difficulty rising from chair on her own and needs electric chair lift. Previous PT completed. She gets SOB with min exertion. Unable to stand for prolonged time. No other concerns   Past Medical History:  Diagnosis Date  . Anxiety state, unspecified   . Cataract   . Depressive disorder, not elsewhere classified   . Lumbago   . Memory loss   . Other and unspecified hyperlipidemia   . Rickets, late effect   . Tobacco use disorder   . Unspecified constipation   . Unspecified essential hypertension   . Varicose veins of lower extremities     Past Surgical History:  Procedure Laterality Date  . CYST EXCISION Bilateral    behind ears  . MOUTH SURGERY  04/12/2014     reports that she quit smoking about 10 months ago. She quit after 55.00 years of use. She has never used smokeless tobacco. She reports that she does not drink  alcohol or use drugs. Social History   Socioeconomic History  . Marital status: Married    Spouse name: Not on file  . Number of children: Not on file  . Years of education: Not on file  . Highest education level: Not on file  Occupational History  . Not on file  Social Needs  . Financial resource strain: Not hard at all  . Food insecurity:    Worry: Never true    Inability: Never true  . Transportation needs:    Medical: No    Non-medical: No  Tobacco Use  . Smoking status: Former Smoker    Years: 55.00    Last attempt to quit: 05/09/2017    Years since quitting: 0.8  . Smokeless tobacco: Never Used  Substance and Sexual Activity  . Alcohol use: No    Alcohol/week: 0.0 standard drinks  . Drug use: No  . Sexual activity: Not on file  Lifestyle  . Physical activity:    Days per week: 0 days    Minutes per session: 0 min  . Stress: Not on file  Relationships  . Social connections:    Talks on phone: More than three times a week    Gets together: More than three times a week    Attends religious service: More than 4 times per year    Active member of club or  organization: No    Attends meetings of clubs or organizations: Never    Relationship status: Married  . Intimate partner violence:    Fear of current or ex partner: No    Emotionally abused: No    Physically abused: No    Forced sexual activity: No  Other Topics Concern  . Not on file  Social History Narrative  . Not on file    Family History  Problem Relation Age of Onset  . Heart disease Father     Allergies  Allergen Reactions  . Penicillins Swelling and Rash    Tolerate ceftriaxone    Outpatient Encounter Medications as of 03/24/2018  Medication Sig  . acetaminophen (TYLENOL) 325 MG tablet Take 325 mg by mouth daily as needed (pain).  Marland Kitchen alendronate (FOSAMAX) 70 MG tablet TAKE 1 TABLET(70 MG) BY MOUTH EVERY 7 DAYS WITH A FULL GLASS OF WATER AND ON AN EMPTY STOMACH  . amLODipine (NORVASC) 5 MG  tablet Take 1 tablet (5 mg total) by mouth daily. For blood pressure  . atorvastatin (LIPITOR) 20 MG tablet TAKE 1 TABLET BY MOUTH EVERY DAY FOR CHOLESTEROL  . Memantine HCl-Donepezil HCl (NAMZARIC) 28-10 MG CP24 Take 1 capsule by mouth daily.  . Multiple Vitamins-Minerals (MULTIVITAMIN WITH MINERALS) tablet Take 1 tablet by mouth daily with supper. Centrum Silver  . nystatin (MYCOSTATIN/NYSTOP) powder Apply topically 2 (two) times daily. To groin and abdomen area  . QUEtiapine (SEROQUEL) 100 MG tablet TAKE 1 TABLET BY MOUTH EVERY MORNING AND 1 TABLET AT BEDTIME  . rivastigmine (EXELON) 13.3 MG/24HR Place 1 patch (13.3 mg total) onto the skin daily.  . [DISCONTINUED] fidaxomicin (DIFICID) 200 MG TABS tablet Take 1 tablet (200 mg total) by mouth 2 (two) times daily. For diarrhea infection (Patient not taking: Reported on 03/24/2018)   No facility-administered encounter medications on file as of 03/24/2018.     Review of Systems:  Review of Systems  Unable to perform ROS: Dementia    Health Maintenance  Topic Date Due  . INFLUENZA VACCINE  01/07/2018  . TETANUS/TDAP  06/08/2024  . DEXA SCAN  Completed  . PNA vac Low Risk Adult  Completed    Physical Exam: Vitals:   03/24/18 0845  BP: 110/62  Pulse: 89  Temp: 98.2 F (36.8 C)  TempSrc: Oral  SpO2: 93%  Weight: 181 lb (82.1 kg)  Height: 5\' 7"  (1.702 m)   Body mass index is 28.35 kg/m. Physical Exam  Constitutional:  Looks well in NAD, she is wearing 2 different earrings  Cardiovascular:  Trace LE edema b/l  Abdominal: She exhibits no distension and no mass. There is no tenderness. There is no rebound and no guarding. No hernia.  Musculoskeletal: She exhibits edema.       Lumbar back: She exhibits decreased range of motion, tenderness and spasm.       Back:  Neurological: She is alert. Gait (unsteady) abnormal.  Poor trunk stability  Skin: No rash noted.  Psychiatric: She has a normal mood and affect. Her behavior is  normal.    Labs reviewed: Basic Metabolic Panel: Recent Labs    09/14/17 0929  01/05/18 0648 01/06/18 0327 01/07/18 0445 01/26/18 1637 03/10/18 1355  NA 143   < > 143 141 142 144 139  K 4.3   < > 3.5 3.1* 3.5 3.6 3.8  CL 108   < > 113* 109 109 102 105  CO2 27   < > 21* 23 22 28  24  GLUCOSE 103*   < > 106* 99 99 116 155*  BUN 16   < > 6* 8 5* 11 11  CREATININE 0.92   < > 1.07* 1.06* 0.91 1.17* 1.18*  CALCIUM 9.6   < > 8.5* 8.4* 8.7* 9.1 9.3  MG  --   --  1.5* 2.0  --   --   --   TSH 1.56  --   --   --   --   --   --    < > = values in this interval not displayed.   Liver Function Tests: Recent Labs    01/02/18 1638 01/04/18 0635 03/10/18 1355  AST 26 33 14  ALT 12 14 8   ALKPHOS 75 69  --   BILITOT 1.0 0.9 0.5  PROT 7.7 6.4* 7.6  ALBUMIN 3.0* 2.3*  --    No results for input(s): LIPASE, AMYLASE in the last 8760 hours. No results for input(s): AMMONIA in the last 8760 hours. CBC: Recent Labs    11/11/17 1517  01/07/18 0445 01/26/18 1637 03/10/18 1355  WBC 9.2   < > 10.8* 12.2* 8.4  NEUTROABS 5,180  --   --  7,662 4,486  HGB 13.0   < > 12.0 12.6 12.4  HCT 38.9   < > 37.4 38.7 37.2  MCV 86.1   < > 89.0 86.4 86.1  PLT 205   < > 244 305 238   < > = values in this interval not displayed.   Lipid Panel: Recent Labs    09/14/17 0929  CHOL 174  HDL 67  LDLCALC 94  TRIG 45  CHOLHDL 2.6   Lab Results  Component Value Date   HGBA1C 6.1 (H) 11/11/2017    Procedures since last visit: No results found.  Assessment/Plan   ICD-10-CM   1. Physical deconditioning R53.81 Ambulatory referral to Home Health  2. Spasm of muscle of lower back M62.830 Ambulatory referral to Home Health    DME Other see comment  3. Mixed Alzheimer's and vascular dementia (HCC) G30.9 Ambulatory referral to Home Health   F01.50 DME Other see comment   F02.80   4. Unsteady gait R26.81 Ambulatory referral to Home Health    DME Other see comment  5. Diarrhea of presumed infectious  origin R19.7    resolved  6. Need for immunization against influenza Z23 Flu Vaccine QUAD 36+ mos IM     Influenza vaccine given today  Will call with home health referral for therapy  Ok to continue culturelle daily for colon health  Continue current medications as ordered  Follow up as scheduled with new PCP   Ilda Laskin S. Ancil Linsey  [facility] [address] [place], [place] [address] [phone] Cell (Monday-Friday 8 AM - 5 PM) [phone] After 5 PM and follow prompts

## 2018-03-24 NOTE — Patient Instructions (Signed)
Will call with home health referral for therapy  Continue current medications as ordered  Follow up as scheduled with new PCP

## 2018-03-25 DIAGNOSIS — F0150 Vascular dementia without behavioral disturbance: Secondary | ICD-10-CM | POA: Diagnosis not present

## 2018-03-25 DIAGNOSIS — I10 Essential (primary) hypertension: Secondary | ICD-10-CM

## 2018-03-25 DIAGNOSIS — R2689 Other abnormalities of gait and mobility: Secondary | ICD-10-CM

## 2018-03-25 DIAGNOSIS — F320 Major depressive disorder, single episode, mild: Secondary | ICD-10-CM

## 2018-03-25 DIAGNOSIS — F0280 Dementia in other diseases classified elsewhere without behavioral disturbance: Secondary | ICD-10-CM | POA: Diagnosis not present

## 2018-03-25 DIAGNOSIS — M6281 Muscle weakness (generalized): Secondary | ICD-10-CM

## 2018-03-25 DIAGNOSIS — G309 Alzheimer's disease, unspecified: Secondary | ICD-10-CM

## 2018-03-25 DIAGNOSIS — I69311 Memory deficit following cerebral infarction: Secondary | ICD-10-CM

## 2018-04-26 ENCOUNTER — Encounter: Payer: Self-pay | Admitting: Nurse Practitioner

## 2018-04-26 ENCOUNTER — Ambulatory Visit (INDEPENDENT_AMBULATORY_CARE_PROVIDER_SITE_OTHER): Payer: Medicare Other | Admitting: Nurse Practitioner

## 2018-04-26 VITALS — BP 96/70 | HR 98 | Temp 99.2°F | Ht 67.0 in | Wt 179.0 lb

## 2018-04-26 DIAGNOSIS — E782 Mixed hyperlipidemia: Secondary | ICD-10-CM | POA: Diagnosis not present

## 2018-04-26 DIAGNOSIS — F0281 Dementia in other diseases classified elsewhere with behavioral disturbance: Secondary | ICD-10-CM

## 2018-04-26 DIAGNOSIS — N3000 Acute cystitis without hematuria: Secondary | ICD-10-CM | POA: Diagnosis not present

## 2018-04-26 DIAGNOSIS — G309 Alzheimer's disease, unspecified: Secondary | ICD-10-CM

## 2018-04-26 DIAGNOSIS — M8580 Other specified disorders of bone density and structure, unspecified site: Secondary | ICD-10-CM

## 2018-04-26 DIAGNOSIS — R32 Unspecified urinary incontinence: Secondary | ICD-10-CM

## 2018-04-26 MED ORDER — ATORVASTATIN CALCIUM 20 MG PO TABS: ORAL_TABLET | ORAL | 3 refills | Status: DC

## 2018-04-26 NOTE — Patient Instructions (Addendum)
Urine culture positive for E. Coli. cipro x 5days sent  You will be contacted to schedule bone density.  Please bring copy of living will and HCPOA.

## 2018-04-26 NOTE — Progress Notes (Signed)
Subjective:  Patient ID: Carol [name], female    DOB: 01-Jul-1935  Age: 82 y.o. MRN: [id number]  CC: Establish Care (est care/med refills. )  HPI  [name] is transferring care from Dr. Montez Moritaarter. She is accompanied by daughters today. 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 She has 4children (girls) who very involved in her care except one. She lives at home alone but has 24hrs care from home Health and her daughters. Has living will HCPOA is daughter: [name] Other daugthers involved in care are Rosey Batheresa Pinnix and Jennet Maduroynthia  McNeil  Alzheimer Dementia with behavioral disturbance: Current use of namzaric, exelon patch and seroquel. Daughters report that she intermittently exhibits aggressive behavior or increased somnolence. Recently it has been more daytime somnolence, Seroquel dose was decreased to half tab AM and PM. Has varying appetite, so small frequent meals are encouraged, also receives meals on wheel. Needs frequent verbal cues to encourage mobility, otherwise she sits all day. She just completed home PT x4 weeks. PT was beneficial but she need frequent encouragement to participate in exercise  Stool and bladder incontinence: intermittent diarrhea, alternating between constipation and loose stool. Has External hemorrhoids. No melena, no hematochezia Recent urosepsis which led to hospitalization.  HTN: Controlled with amlodipine. BP Readings from Last 3 Encounters:  04/26/18 96/70  03/24/18 110/62  03/10/18 136/84   Osteoporosis: No adverse effect with fosamax. Needs repeat dexa scan.  Reviewed past Medical, Social and Family history today.  Outpatient Medications Prior to Visit  Medication Sig Dispense Refill  . acetaminophen (TYLENOL) 325 MG tablet Take 325 mg by mouth daily as needed (pain).    Marland Kitchen. alendronate (FOSAMAX) 70 MG tablet TAKE 1 TABLET(70 MG) BY MOUTH EVERY 7 DAYS WITH A FULL GLASS OF WATER AND ON AN EMPTY STOMACH 12 tablet 3  .  amLODipine (NORVASC) 5 MG tablet Take 1 tablet (5 mg total) by mouth daily. For blood pressure 90 tablet 3  . Memantine HCl-Donepezil HCl (NAMZARIC) 28-10 MG CP24 Take 1 capsule by mouth daily. 30 capsule 10  . Multiple Vitamins-Minerals (MULTIVITAMIN WITH MINERALS) tablet Take 1 tablet by mouth daily with supper. Centrum Silver    . nystatin (MYCOSTATIN/NYSTOP) powder Apply topically 2 (two) times daily. To groin and abdomen area 45 g 1  . QUEtiapine (SEROQUEL) 100 MG tablet TAKE 1 TABLET BY MOUTH EVERY MORNING AND 1 TABLET AT BEDTIME 180 tablet 3  . rivastigmine (EXELON) 13.3 MG/24HR Place 1 patch (13.3 mg total) onto the skin daily. 30 patch 10  . atorvastatin (LIPITOR) 20 MG tablet TAKE 1 TABLET BY MOUTH EVERY DAY FOR CHOLESTEROL 90 tablet 3   No facility-administered medications prior to visit.     ROS See HPI  Objective:  BP 96/70   Pulse 98   Temp 99.2 F (37.3 C) (Oral)   Ht 5\' 7"  (1.702 m)   Wt 179 lb (81.2 kg)   SpO2 97%   BMI 28.04 kg/m   BP Readings from Last 3 Encounters:  04/26/18 96/70  03/24/18 110/62  03/10/18 136/84    Wt Readings from Last 3 Encounters:  04/26/18 179 lb (81.2 kg)  03/24/18 181 lb (82.1 kg)  03/10/18 184 lb 3.2 oz (83.6 kg)    Physical Exam  Constitutional: No distress.  Neck: Normal range of motion. Neck supple.  Cardiovascular: Normal rate, regular rhythm and normal heart sounds.  Pulmonary/Chest: Effort normal and breath sounds normal.  Abdominal: Soft. Bowel sounds are normal.  Musculoskeletal:  She exhibits no edema or tenderness.  Neurological: She is alert.  Oriented to family  Vitals reviewed.   Lab Results  Component Value Date   WBC 8.4 03/10/2018   HGB 12.4 03/10/2018   HCT 37.2 03/10/2018   PLT 238 03/10/2018   GLUCOSE 155 (H) 03/10/2018   CHOL 174 09/14/2017   TRIG 45 09/14/2017   HDL 67 09/14/2017   LDLCALC 94 09/14/2017   ALT 8 03/10/2018   AST 14 03/10/2018   NA 139 03/10/2018   K 3.8 03/10/2018   CL 105  03/10/2018   CREATININE 1.18 (H) 03/10/2018   BUN 11 03/10/2018   CO2 24 03/10/2018   TSH 1.56 09/14/2017   INR 1.10 09/19/2009   HGBA1C 6.1 (H) 11/11/2017    Dg Chest 2 View  Result Date: 01/02/2018 CLINICAL DATA:  Fever.  Altered mental status. EXAM: CHEST - 2 VIEW COMPARISON:  Chest radiograph 09/23/2009, CT 09/16/2009 FINDINGS: Lower lung volumes from prior exam. The heart is enlarged. Diffuse aortic tortuosity including prominence of the aortic arch. Diffusely increased interstitial opacities which may be pulmonary edema, atypical infection or bronchovascular crowding. Patchy retrocardiac and peripheral basilar opacities. Possible tiny pleural effusions. No pneumothorax. IMPRESSION: 1. Cardiomegaly with tortuous atherosclerotic aorta. Aortic arch tortuosity is more prominent than prior exam, likely secondary to differences in technique. Recommend follow-up PA and lateral views when patient is able to take better inspiration. 2. Diffusely increased interstitial opacities may represent pulmonary edema, atypical infection, or less likely bronchovascular crowding secondary to low lung volumes. 3. Patchy retrocardiac and peripheral bibasilar opacities, pneumonia or aspiration is considered in the setting of fever. Electronically Signed   By: Rubye Oaks M.D.   On: 01/02/2018 21:25   Ct Head Wo Contrast  Result Date: 01/02/2018 CLINICAL DATA:  82 year old female with altered mental status. EXAM: CT HEAD WITHOUT CONTRAST TECHNIQUE: Contiguous axial images were obtained from the base of the skull through the vertex without intravenous contrast. COMPARISON:  05/17/2015 and prior CTs FINDINGS: Brain: No evidence of acute infarction, hemorrhage, hydrocephalus, extra-axial collection or mass lesion/mass effect. Atrophy and chronic small-vessel white matter ischemic changes noted. Vascular: Intracranial atherosclerotic calcifications noted periods Skull: Normal. Negative for fracture or focal lesion.  Sinuses/Orbits: No acute finding. Chronic RIGHT maxillary sinus mucoperiosteal thickening noted. Other: None. IMPRESSION: 1. No evidence of acute intracranial abnormality 2. Atrophy and chronic small-vessel white matter ischemic changes. Electronically Signed   By: Harmon Pier M.D.   On: 01/02/2018 19:20    Assessment & Plan:   Fradel was seen today for establish care.  Diagnoses and all orders for this visit:  Urinary incontinence, unspecified type -     Urinalysis w microscopic + reflex cultur  Mixed hyperlipidemia -     atorvastatin (LIPITOR) 20 MG tablet; TAKE 1 TABLET BY MOUTH EVERY DAY FOR CHOLESTEROL  Osteopenia, unspecified location -     DG Bone Density; Future  Acute cystitis without hematuria -     ciprofloxacin (CIPRO) 250 MG tablet; Take 1 tablet (250 mg total) by mouth 2 (two) times daily for 5 days. With food  Alzheimer's dementia with behavioral disturbance, unspecified timing of dementia onset (HCC)  Other orders -     REFLEXIVE URINE CULTURE -     Urine Culture   I am having Wynelle Cleveland. Clinton Sawyer start on ciprofloxacin. I am also having her maintain her multivitamin with minerals, Memantine HCl-Donepezil HCl, rivastigmine, alendronate, QUEtiapine, acetaminophen, nystatin, amLODipine, and atorvastatin.  Meds ordered this encounter  Medications  . atorvastatin (LIPITOR) 20 MG tablet    Sig: TAKE 1 TABLET BY MOUTH EVERY DAY FOR CHOLESTEROL    Dispense:  90 tablet    Refill:  3    Order Specific Question:   Supervising Provider    Answer:   Dianne Dun [[id number]]  . ciprofloxacin (CIPRO) 250 MG tablet    Sig: Take 1 tablet (250 mg total) by mouth 2 (two) times daily for 5 days. With food    Dispense:  10 tablet    Refill:  0    Order Specific Question:   Supervising Provider    Answer:   Dianne Dun [[id number]]    Problem List Items Addressed This Visit      Nervous and Auditory   Dementia with behavioral disturbance (HCC)     Musculoskeletal and  Integument   Osteopenia   Relevant Orders   DG Bone Density     Genitourinary   UTI (urinary tract infection)   Relevant Medications   ciprofloxacin (CIPRO) 250 MG tablet     Other   Hyperlipidemia   Relevant Medications   atorvastatin (LIPITOR) 20 MG tablet    Other Visit Diagnoses    Urinary incontinence, unspecified type    -  Primary   Relevant Orders   Urinalysis w microscopic + reflex cultur (Completed)       Follow-up: Return in about 3 months (around 07/27/2018) for dementia anf HTN ( only).  Alysia Penna, NP

## 2018-04-28 ENCOUNTER — Encounter: Payer: Self-pay | Admitting: Nurse Practitioner

## 2018-04-28 LAB — URINE CULTURE
MICRO NUMBER:: 91391907
SPECIMEN QUALITY: ADEQUATE

## 2018-04-28 LAB — URINALYSIS W MICROSCOPIC + REFLEX CULTURE
Bacteria, UA: NONE SEEN /HPF
Bilirubin Urine: NEGATIVE
Glucose, UA: NEGATIVE
HGB URINE DIPSTICK: NEGATIVE
Hyaline Cast: NONE SEEN /LPF
Nitrites, Initial: NEGATIVE
Specific Gravity, Urine: 1.022 (ref 1.001–1.03)
pH: 5.5 (ref 5.0–8.0)

## 2018-04-28 LAB — CULTURE INDICATED

## 2018-04-28 MED ORDER — CIPROFLOXACIN HCL 250 MG PO TABS: 250.0000 mg | ORAL_TABLET | Freq: Two times a day (BID) | ORAL | 0 refills | Status: AC

## 2018-05-10 MED FILL — SHINGRIX 50 MCG SUS: 50 | 1 days supply | Qty: 1 | Fill #1

## 2018-06-21 ENCOUNTER — Other Ambulatory Visit: Payer: Self-pay | Admitting: Nurse Practitioner

## 2018-06-21 DIAGNOSIS — F0280 Dementia in other diseases classified elsewhere without behavioral disturbance: Principal | ICD-10-CM

## 2018-06-21 DIAGNOSIS — G309 Alzheimer's disease, unspecified: Principal | ICD-10-CM

## 2018-06-21 DIAGNOSIS — F0150 Vascular dementia without behavioral disturbance: Secondary | ICD-10-CM

## 2018-06-21 DIAGNOSIS — I10 Essential (primary) hypertension: Secondary | ICD-10-CM

## 2018-06-21 MED ORDER — RIVASTIGMINE 13.3 MG/24HR TD PT24: 13.3000 mg | MEDICATED_PATCH | Freq: Every day | TRANSDERMAL | 10 refills | Status: DC

## 2018-06-21 MED ORDER — MEMANTINE HCL-DONEPEZIL HCL ER 28-10 MG PO CP24: 1.0000 | ORAL_CAPSULE | Freq: Every day | ORAL | 3 refills | Status: DC

## 2018-06-21 MED ORDER — RIVASTIGMINE 13.3 MG/24HR TD PT24: 13.3000 mg | MEDICATED_PATCH | Freq: Every day | TRANSDERMAL | 3 refills | Status: DC

## 2018-06-21 NOTE — Telephone Encounter (Signed)
Requested medication (s) are due for refill today: yes  Requested medication (s) are on the active medication list: yes    Last refill: 06/30/17  #30  10 refills  Future visit scheduled no  Notes to clinic:historical provider  Requested Prescriptions  Pending Prescriptions Disp Refills   Memantine HCl-Donepezil HCl (NAMZARIC) 28-10 MG CP24 30 capsule 10    Sig: Take 1 capsule by mouth daily.     Neurology:  Alzheimer's Agents Passed - 06/21/2018  1:19 PM      Passed - Valid encounter within last 6 months    Recent Outpatient Visits          1 month ago Urinary incontinence, unspecified type   [facility] N[facility], Bonna Gains, NP      Future Appointments            In 1 month Nche, Bonna Gains, NP [facility]          rivastigmine (EXELON) 13.3 MG/24HR 30 patch 10    Sig: Place 1 patch (13.3 mg total) onto the skin daily.     Neurology:  Alzheimer's Agents Passed - 06/21/2018  1:19 PM      Passed - Valid encounter within last 6 months    Recent Outpatient Visits          1 month ago Urinary incontinence, unspecified type   [facility] N[facility], Bonna Gains, NP      Future Appointments            In 1 month Nche, Bonna Gains, NP [facility], [place]

## 2018-06-21 NOTE — Telephone Encounter (Signed)
Copied from CRM [id number]. Topic: Quick Communication - Rx Refill/Question >> Jun 21, 2018 12:11 PM Raoul Pitch, [name] wrote: Medication: Memantine HCl-Donepezil HCl (NAMZARIC) 28-10 MG CP24 , rivastigmine (EXELON) 13.3 MG/24HR  Has the patient contacted their pharmacy? Yes  Preferred Pharmacy (with phone number or street name): [id number] - [place], [place] - [address] AT [address] [phone] (Phone) [phone] (Fax)    Agent: Please be advised that RX refills may take up to 3 business days. We ask that you follow-up with your pharmacy.

## 2018-06-21 NOTE — Telephone Encounter (Signed)
Charlotte please advise, we never send in these rx before.

## 2018-06-24 ENCOUNTER — Ambulatory Visit
Admission: RE | Admit: 2018-06-24 | Discharge: 2018-06-24 | Disposition: A | Discharge status: Home / Self Care | Payer: Medicare Other | Source: Ambulatory Visit | Attending: Nurse Practitioner | Admitting: Nurse Practitioner

## 2018-06-24 DIAGNOSIS — M8580 Other specified disorders of bone density and structure, unspecified site: Secondary | ICD-10-CM

## 2018-07-09 ENCOUNTER — Emergency Department (HOSPITAL_COMMUNITY): Payer: Medicare Other

## 2018-07-09 ENCOUNTER — Encounter (HOSPITAL_COMMUNITY): Payer: Self-pay | Admitting: Emergency Medicine

## 2018-07-09 ENCOUNTER — Other Ambulatory Visit: Payer: Self-pay

## 2018-07-09 ENCOUNTER — Emergency Department (HOSPITAL_COMMUNITY)
Admission: EM | Admit: 2018-07-09 | Discharge: 2018-07-09 | Disposition: A | Discharge status: Home / Self Care | Payer: Medicare Other | Attending: Emergency Medicine | Admitting: Emergency Medicine

## 2018-07-09 DIAGNOSIS — F0390 Unspecified dementia without behavioral disturbance: Secondary | ICD-10-CM | POA: Diagnosis not present

## 2018-07-09 DIAGNOSIS — I129 Hypertensive chronic kidney disease with stage 1 through stage 4 chronic kidney disease, or unspecified chronic kidney disease: Secondary | ICD-10-CM | POA: Diagnosis not present

## 2018-07-09 DIAGNOSIS — Z87891 Personal history of nicotine dependence: Secondary | ICD-10-CM | POA: Insufficient documentation

## 2018-07-09 DIAGNOSIS — R531 Weakness: Secondary | ICD-10-CM

## 2018-07-09 DIAGNOSIS — Z79899 Other long term (current) drug therapy: Secondary | ICD-10-CM | POA: Insufficient documentation

## 2018-07-09 DIAGNOSIS — R55 Syncope and collapse: Secondary | ICD-10-CM | POA: Diagnosis present

## 2018-07-09 DIAGNOSIS — R197 Diarrhea, unspecified: Secondary | ICD-10-CM | POA: Diagnosis not present

## 2018-07-09 DIAGNOSIS — N189 Chronic kidney disease, unspecified: Secondary | ICD-10-CM | POA: Insufficient documentation

## 2018-07-09 DIAGNOSIS — G309 Alzheimer's disease, unspecified: Secondary | ICD-10-CM | POA: Diagnosis not present

## 2018-07-09 LAB — COMPREHENSIVE METABOLIC PANEL
ALT: 12 U/L (ref 0–44)
AST: 27 U/L (ref 15–41)
Albumin: 3.3 g/dL — ABNORMAL LOW (ref 3.5–5.0)
Alkaline Phosphatase: 63 U/L (ref 38–126)
Anion gap: 12 (ref 5–15)
BUN: 20 mg/dL (ref 8–23)
CHLORIDE: 108 mmol/L (ref 98–111)
CO2: 22 mmol/L (ref 22–32)
Calcium: 9.6 mg/dL (ref 8.9–10.3)
Creatinine, Ser: 1.29 mg/dL — ABNORMAL HIGH (ref 0.44–1.00)
GFR calc Af Amer: 45 mL/min — ABNORMAL LOW (ref >60)
GFR calc non Af Amer: 39 mL/min — ABNORMAL LOW (ref >60)
Glucose, Bld: 123 mg/dL — ABNORMAL HIGH (ref 70–99)
Potassium: 4.4 mmol/L (ref 3.5–5.1)
Sodium: 142 mmol/L (ref 135–145)
Total Bilirubin: 0.5 mg/dL (ref 0.3–1.2)
Total Protein: 8.2 g/dL — ABNORMAL HIGH (ref 6.5–8.1)

## 2018-07-09 LAB — CBC WITH DIFFERENTIAL/PLATELET
Abs Immature Granulocytes: 0.05 10*3/uL (ref 0.00–0.07)
Basophils Absolute: 0 10*3/uL (ref 0.0–0.1)
Basophils Relative: 0 %
Eosinophils Absolute: 0.1 10*3/uL (ref 0.0–0.5)
Eosinophils Relative: 1 %
HCT: 43.1 % (ref 36.0–46.0)
HEMOGLOBIN: 13 g/dL (ref 12.0–15.0)
Immature Granulocytes: 1 %
Lymphocytes Relative: 32 %
Lymphs Abs: 3.5 10*3/uL (ref 0.7–4.0)
MCH: 27.7 pg (ref 26.0–34.0)
MCHC: 30.2 g/dL (ref 30.0–36.0)
MCV: 91.9 fL (ref 80.0–100.0)
Monocytes Absolute: 0.7 10*3/uL (ref 0.1–1.0)
Monocytes Relative: 6 %
Neutro Abs: 6.7 10*3/uL (ref 1.7–7.7)
Neutrophils Relative %: 60 %
Platelets: 229 10*3/uL (ref 150–400)
RBC: 4.69 MIL/uL (ref 3.87–5.11)
RDW: 14.6 % (ref 11.5–15.5)
WBC: 11 10*3/uL — ABNORMAL HIGH (ref 4.0–10.5)
nRBC: 0 % (ref 0.0–0.2)

## 2018-07-09 LAB — URINALYSIS, ROUTINE W REFLEX MICROSCOPIC
Bilirubin Urine: NEGATIVE
Glucose, UA: NEGATIVE mg/dL
Hgb urine dipstick: NEGATIVE
Ketones, ur: NEGATIVE mg/dL
Leukocytes, UA: NEGATIVE
Nitrite: NEGATIVE
Protein, ur: 100 mg/dL — AB
Specific Gravity, Urine: 1.024 (ref 1.005–1.030)
pH: 5 (ref 5.0–8.0)

## 2018-07-09 MED ORDER — SODIUM CHLORIDE 0.9 % IV BOLUS
1000.0000 mL | Freq: Once | INTRAVENOUS | Status: AC
  Administered 2018-07-09: 1000 mL via INTRAVENOUS

## 2018-07-09 MED ORDER — THIAMINE HCL 100 MG/ML IJ SOLN
100.0000 mg | Freq: Once | INTRAMUSCULAR | Status: AC
  Administered 2018-07-09: 100 mg via INTRAVENOUS
  Filled 2018-07-09: qty 2

## 2018-07-09 NOTE — ED Provider Notes (Signed)
[facility] EMERGENCY DEPARTMENT Provider Note   CSN: [id number] Arrival date & time: 07/09/18  1709     History   Chief Complaint Chief Complaint  Patient presents with  . Loss of Consciousness   5 caveat dementia history is obtained from patient's daughter and her granddaughter HPI Carol [name] is a 83 y.o. female.  HPI Patient's daughter reports that her caretaker stated patient became unconscious for period of 10 to 15 minutes this morning after an episode of diarrhea.  She has awaken with diarrhea in her diaper every day for approximately the past 2 to 3 weeks.  She had diminished appetite yesterday.  8 hard-boiled egg, ham sandwich and sausage.  She is also had a cough for the past 2 to 3 weeks and rhinorrhea.  No fever.  Patient denies pain anywhere.  Denies shortness of breath.  No known fever.  No treatment prior to coming here.  Daughter reports no recent travel she was treated several weeks ago for urinary tract infection Past Medical History:  Diagnosis Date  . Anxiety state, unspecified   . Cataract   . Depressive disorder, not elsewhere classified   . Lumbago   . Memory loss   . Other and unspecified hyperlipidemia   . Rickets, late effect   . Tobacco use disorder   . Unspecified constipation   . Unspecified essential hypertension   . Varicose veins of lower extremities     Patient Active Problem List   Diagnosis Date Noted  . Cutaneous candidiasis 03/10/2018  . Acute metabolic encephalopathy 01/02/2018  . Sepsis (HCC) 01/02/2018  . Electrolyte disturbance 08/01/2015  . Altered mental status 05/17/2015  . UTI (urinary tract infection) 05/17/2015  . AKI (acute kidney injury) (HCC) 05/17/2015  . Dementia with behavioral disturbance (HCC) 06/08/2014  . Cataract 01/24/2014  . Osteopenia 12/14/2013  . Routine general medical examination at a health care facility 12/14/2013  . Depression due to dementia (HCC) 09/14/2013  . Psychosis  (HCC) 03/01/2013  . Need for prophylactic vaccination and inoculation against influenza 03/01/2013  . Unspecified constipation 03/01/2013  . Mixed Alzheimer's and vascular dementia (HCC) 01/26/2013  . Essential hypertension, benign 01/26/2013  . Hyperlipidemia 01/26/2013  . Other disorders of bone and cartilage(733.99) 01/26/2013  . Tobacco user 01/26/2013  . Constipation 01/26/2013  . Other and unspecified hyperlipidemia   . Tobacco use disorder   . Depressive disorder, not elsewhere classified   . Unspecified essential hypertension     Past Surgical History:  Procedure Laterality Date  . CYST EXCISION Bilateral    behind ears  . MOUTH SURGERY  04/12/2014     OB History   No obstetric history on file.      Home Medications    Prior to Admission medications   Medication Sig Start Date End Date Taking? Authorizing Provider  acetaminophen (TYLENOL) 325 MG tablet Take 325 mg by mouth daily as needed (pain).    [provider]  alendronate (FOSAMAX) 70 MG tablet TAKE 1 TABLET(70 MG) BY MOUTH EVERY 7 DAYS WITH A FULL GLASS OF WATER AND ON AN EMPTY STOMACH 09/09/17   Kirt Boys, DO  amLODipine (NORVASC) 5 MG tablet TAKE 1 TABLET(5 MG) BY MOUTH DAILY 06/21/18   Nche, Bonna Gains, NP  atorvastatin (LIPITOR) 20 MG tablet TAKE 1 TABLET BY MOUTH EVERY DAY FOR CHOLESTEROL 04/26/18   Nche, Bonna Gains, NP  Memantine HCl-Donepezil HCl (NAMZARIC) 28-10 MG CP24 Take 1 capsule by mouth daily. 06/21/18  Nche, Bonna Gains, NP  Multiple Vitamins-Minerals (MULTIVITAMIN WITH MINERALS) tablet Take 1 tablet by mouth daily with supper. Centrum Silver    [provider]  nystatin (MYCOSTATIN/NYSTOP) powder Apply topically 2 (two) times daily. To groin and abdomen area 03/10/18   Kirt Boys, DO  QUEtiapine (SEROQUEL) 100 MG tablet TAKE 1 TABLET BY MOUTH EVERY MORNING AND 1 TABLET AT BEDTIME 11/11/17   Kirt Boys, DO  rivastigmine (EXELON) 13.3 MG/24HR Place 1 patch (13.3 mg  total) onto the skin daily. 06/21/18   Nche, Bonna Gains, NP    Family History Family History  Problem Relation Age of Onset  . Heart disease Father     Social History Social History   Tobacco Use  . Smoking status: Former Smoker    Years: 55.00    Last attempt to quit: 05/09/2017    Years since quitting: 1.1  . Smokeless tobacco: Never Used  Substance Use Topics  . Alcohol use: No    Alcohol/week: 0.0 standard drinks  . Drug use: No     Allergies   Penicillins   Review of Systems Review of Systems  Unable to perform ROS: Dementia  Constitutional: Positive for appetite change.  Respiratory: Positive for cough.   Gastrointestinal: Positive for diarrhea.  Musculoskeletal: Positive for gait problem.       Walks with walker past several weeks     Physical Exam Updated Vital Signs BP 139/88   Pulse 96   Temp 98.1 F (36.7 C) (Oral)   Resp 18   SpO2 100%   Physical Exam Vitals signs and nursing note reviewed.  Constitutional:      Appearance: She is well-developed. She is not ill-appearing.  HENT:     Head: Normocephalic and atraumatic.  Eyes:     Conjunctiva/sclera: Conjunctivae normal.     Pupils: Pupils are equal, round, and reactive to light.  Neck:     Musculoskeletal: Neck supple.     Thyroid: No thyromegaly.     Trachea: No tracheal deviation.  Cardiovascular:     Rate and Rhythm: Normal rate and regular rhythm.     Heart sounds: No murmur.  Pulmonary:     Effort: Pulmonary effort is normal.     Breath sounds: Normal breath sounds.  Abdominal:     General: Bowel sounds are normal. There is no distension.     Palpations: Abdomen is soft.     Tenderness: There is no abdominal tenderness.  Musculoskeletal: Normal range of motion.        General: No tenderness.  Skin:    General: Skin is warm and dry.     Findings: No rash.  Neurological:     Mental Status: She is alert.     Coordination: Coordination normal.     Comments: Normal gait.   Walks unassisted.  Mildly lightheaded on standing      ED Treatments / Results  Labs (all labs ordered are listed, but only abnormal results are displayed) Labs Reviewed  COMPREHENSIVE METABOLIC PANEL  CBC WITH DIFFERENTIAL/PLATELET  URINALYSIS, ROUTINE W REFLEX MICROSCOPIC    EKG EKG Interpretation  Date/Time:  Friday July 09 2018 17:35:07 EST Ventricular Rate:  94 PR Interval:    QRS Duration: 87 QT Interval:  375 QTC Calculation: 469 R Axis:   -16 Text Interpretation:  Sinus rhythm Left ventricular hypertrophy Inferior infarct, age indeterminate Baseline wander in lead(s) V5 V6 No significant change since last tracing Confirmed by Doug Sou [phone]) on 07/09/2018 5:46:15 PM  Radiology No results found.  Procedures Procedures (including critical care time)  Medications Ordered in ED Medications  sodium chloride 0.9 % bolus 1,000 mL (has no administration in time range)  thiamine (B-1) injection 100 mg (has no administration in time range)   Results for orders placed or performed during the hospital encounter of 07/09/18  Comprehensive metabolic panel  Result Value Ref Range   Sodium 142 135 - 145 mmol/L   Potassium 4.4 3.5 - 5.1 mmol/L   Chloride 108 98 - 111 mmol/L   CO2 22 22 - 32 mmol/L   Glucose, Bld 123 (H) 70 - 99 mg/dL   BUN 20 8 - 23 mg/dL   Creatinine, Ser 1.61 (H) 0.44 - 1.00 mg/dL   Calcium 9.6 8.9 - 09.6 mg/dL   Total Protein 8.2 (H) 6.5 - 8.1 g/dL   Albumin 3.3 (L) 3.5 - 5.0 g/dL   AST 27 15 - 41 U/L   ALT 12 0 - 44 U/L   Alkaline Phosphatase 63 38 - 126 U/L   Total Bilirubin 0.5 0.3 - 1.2 mg/dL   GFR calc non Af Amer 39 (L) >60 mL/min   GFR calc Af Amer 45 (L) >60 mL/min   Anion gap 12 5 - 15  CBC with Differential/Platelet  Result Value Ref Range   WBC 11.0 (H) 4.0 - 10.5 K/uL   RBC 4.69 3.87 - 5.11 MIL/uL   Hemoglobin 13.0 12.0 - 15.0 g/dL   HCT 04.5 40.9 - 81.1 %   MCV 91.9 80.0 - 100.0 fL   MCH 27.7 26.0 - 34.0 pg   MCHC  30.2 30.0 - 36.0 g/dL   RDW 91.4 78.2 - 95.6 %   Platelets 229 150 - 400 K/uL   nRBC 0.0 0.0 - 0.2 %   Neutrophils Relative % 60 %   Neutro Abs 6.7 1.7 - 7.7 K/uL   Lymphocytes Relative 32 %   Lymphs Abs 3.5 0.7 - 4.0 K/uL   Monocytes Relative 6 %   Monocytes Absolute 0.7 0.1 - 1.0 K/uL   Eosinophils Relative 1 %   Eosinophils Absolute 0.1 0.0 - 0.5 K/uL   Basophils Relative 0 %   Basophils Absolute 0.0 0.0 - 0.1 K/uL   Immature Granulocytes 1 %   Abs Immature Granulocytes 0.05 0.00 - 0.07 K/uL  Urinalysis, Routine w reflex microscopic  Result Value Ref Range   Color, Urine YELLOW YELLOW   APPearance HAZY (A) CLEAR   Specific Gravity, Urine 1.024 1.005 - 1.030   pH 5.0 5.0 - 8.0   Glucose, UA NEGATIVE NEGATIVE mg/dL   Hgb urine dipstick NEGATIVE NEGATIVE   Bilirubin Urine NEGATIVE NEGATIVE   Ketones, ur NEGATIVE NEGATIVE mg/dL   Protein, ur 213 (A) NEGATIVE mg/dL   Nitrite NEGATIVE NEGATIVE   Leukocytes, UA NEGATIVE NEGATIVE   RBC / HPF 0-5 0 - 5 RBC/hpf   WBC, UA 0-5 0 - 5 WBC/hpf   Bacteria, UA FEW (A) NONE SEEN   Squamous Epithelial / LPF 0-5 0 - 5   Mucus PRESENT    Dg Chest 2 View  Result Date: 07/09/2018 CLINICAL DATA:  Syncope x2 today. EXAM: CHEST - 2 VIEW COMPARISON:  PA and lateral chest 01/02/2018 in 09/16/2009. CT chest 09/16/2009. FINDINGS: Lung volumes are low with basilar atelectasis. There is cardiomegaly. Tortuous aorta with atherosclerotic vascular disease noted. No acute or focal bony abnormality. IMPRESSION: No acute disease. Cardiomegaly. Atherosclerosis. Electronically Signed   By: Drusilla Kanner M.D.  On: 07/09/2018 18:49   Dg Bone Density  Result Date: 06/24/2018 EXAM: DUAL X-RAY ABSORPTIOMETRY (DXA) FOR BONE MINERAL DENSITY IMPRESSION: Referring Physician: Alysia PennaNCHE, CHARLOTTE LUM Your patient completed a BMD test using Lunar IDXA DXA system ( analysis version: 16 ) manufactured by [employer]. Technologist: CG PATIENT: Name: Joylene IgoWILLIAMSON, Dianne M  Patient ID: [id number] Birth Date: May 10, 1936 Height: 64.0 in. Sex: Female Measured: 06/24/2018 Weight: 191.1 lbs. Indications: Advanced Age, Dementia (290.0), Estrogen Deficient, Family History of Osteoporosis, Hx of tobacco use, Postmenopausal Fractures: None Treatments: Calcium (E943.0), Fosamax, Vitamin D (E933.5) ASSESSMENT: The BMD measured at AP Spine L1-L4 is 0.921 g/cm2 with a T-score of -2.2. This patient is considered osteopenic according to World Health Organization [facility](WHO) criteria. There has been a statistically significant decrease in BMD of left hip since prior exam dated 01/11/2014. The scan quality is limited by patient body habitus. DXA exam performed on prior Hologic device measured only unilateral hip (Total Mean was not measured) Therefore current or prior Total Mean cannot be compared. Site Region Measured Date Measured Age YA BMD Significant CHANGE T-score AP Spine  L1-L4      06/24/2018    80.6         -2.2    0.921 g/cm2 AP Spine L1-L4 01/11/2014 76.1 -4.1 0.923 g/cm2 * DualFemur Total Left 06/24/2018 80.6 -1.8 0.781 g/cm2 * DualFemur Total Left 01/11/2014    76.1         -0.6    0.931 g/cm2 DualFemur Total Mean 06/24/2018    80.6         -1.5    0.825 g/cm2 World Health Organization [facility](WHO) criteria for post-menopausal, Caucasian Women: Normal       T-score at or above -1 SD Osteopenia   T-score between -1 and -2.5 SD Osteoporosis T-score at or below -2.5 SD RECOMMENDATION: 1. All patients should optimize calcium and vitamin D intake. 2. Consider FDA approved medical therapies in postmenopausal women and men aged 81 years and older, based on the following: a. A hip or vertebral (clinical or morphometric) fracture b. T- score < or = -2.5 at the femoral neck or spine after appropriate evaluation to exclude secondary causes c. Low bone mass (T-score between -1.0 and -2.5 at the femoral neck or spine) and a 10 year probability of a hip fracture > or = 3% or a 10 year probability of a major  osteoporosis-related fracture > or = 20% based on the [place]-adapted WHO algorithm d. Clinician judgment and/or patient preferences may indicate treatment for people with 10-year fracture probabilities above or below these levels FOLLOW-UP: People with diagnosed cases of osteoporosis or at high risk for fracture should have regular bone mineral density tests. For patients eligible for Medicare, routine testing is allowed once every 2 years. The testing frequency can be increased to one year for patients who have rapidly progressing disease, those who are receiving or discontinuing medical therapy to restore bone mass, or have additional risk factors. I have reviewed this report and agree with the above findings. [facility] FRAX* 10-year Probability of Fracture Based on femoral neck BMD: DualFemur (Left) Major Osteoporotic Fracture: 5.0% Hip Fracture:                0.9% Population:                  [place]) Risk Factors:                None *FRAX is a Armed forces logistics/support/administrative officertrademark of the  [employer] [facility] Met[employer] Bone Disease, a World Science writerHealth Organization (WHO) [employer]. ASSESSMENT: The probability of a major osteoporotic fracture is 5.0% within the next ten years. The probability of hip fracture is   0.9% within the next 10 years. Electronically Signed   By: Sherian ReinWei-Chen  Lin M.D.   On: 06/24/2018 14:47    Labwork remarkable for mild renal insufficiency which is chronic otherwise normal Initial Impression / Assessment and Plan / ED Course  I have reviewed the triage vital signs and the nursing notes.  Pertinent labs & imaging results that were available during my care of the patient were reviewed by me and considered in my medical decision making (see chart for details).     8:40 PM patient states "I feel great after treatment with IV normal saline and IV thiamine.  I doubt the patient had true syncope.  Event was unwitnessed by her family.  More likely scenario is that  she got generally weak and that she was "passed out" for approximately 10 minutes. Plan encourage oral hydration.  Follow-up with PCP next week Final Clinical Impressions(s) / ED Diagnoses   #1 generalized weakness #2 chronic renal insufficiency #3 diarrhea Final diagnoses:  None    ED Discharge Orders    None       Doug SouJacubowitz, Aubrianna Orchard, MD 07/09/18 2047

## 2018-07-09 NOTE — ED Notes (Signed)
Purewick placed on pt. 

## 2018-07-09 NOTE — ED Notes (Signed)
Pt given a [place] sandwich to eat and coke to drink ok per Dr Ethelda Chick

## 2018-07-09 NOTE — ED Notes (Signed)
Patient verbalizes understanding of discharge instructions. Opportunity for questioning and answers were provided. Armband removed by staff, pt discharged from ED in wheelchair.  

## 2018-07-09 NOTE — ED Notes (Signed)
Patient transported to X-ray 

## 2018-07-09 NOTE — ED Triage Notes (Signed)
Pt to ED with family who st's they were called by pt's caregiver stating that pt has had 2 syncopal episodes today and was also incontinent of stool at the time

## 2018-07-09 NOTE — Discharge Instructions (Addendum)
Call Carol [name]'s this so that she can be reevaluated next week.Make sure that she drinks at least six 8 ounce glasses of water or gatorade  each day in order to stay well-hydrated.  Avoid milk or foods containing milk such as cheese or ice cream while having diarrhea .  Return if her condition worsens for any reason

## 2018-07-27 ENCOUNTER — Ambulatory Visit (INDEPENDENT_AMBULATORY_CARE_PROVIDER_SITE_OTHER): Payer: Medicare Other | Admitting: Nurse Practitioner

## 2018-07-27 ENCOUNTER — Encounter: Payer: Self-pay | Admitting: Nurse Practitioner

## 2018-07-27 ENCOUNTER — Ambulatory Visit (INDEPENDENT_AMBULATORY_CARE_PROVIDER_SITE_OTHER): Payer: Medicare Other

## 2018-07-27 VITALS — BP 122/76 | HR 88 | Temp 98.4°F | Ht 67.0 in | Wt 191.4 lb

## 2018-07-27 DIAGNOSIS — R5383 Other fatigue: Secondary | ICD-10-CM | POA: Diagnosis not present

## 2018-07-27 DIAGNOSIS — R197 Diarrhea, unspecified: Secondary | ICD-10-CM | POA: Diagnosis not present

## 2018-07-27 DIAGNOSIS — K5900 Constipation, unspecified: Secondary | ICD-10-CM | POA: Diagnosis not present

## 2018-07-27 DIAGNOSIS — R55 Syncope and collapse: Secondary | ICD-10-CM

## 2018-07-27 NOTE — Progress Notes (Signed)
Subjective:  Patient ID: Carol [name], female    DOB: 04/18/1936  Age: 83 y.o. MRN: [id number]  CC: Follow-up (3 mo f/u/ went to ED on 07/09/2018 for loss conciouse--since then it happened again on 07/16/2018. complaining of diarrhea-watery stool. weakness more than normal)  accompanied by 3daughters.  Diarrhea   This is a chronic problem. The current episode started more than 1 month ago. The problem occurs 2 to 4 times per day. The problem has been waxing and waning. The stool consistency is described as watery. The patient states that diarrhea does not awaken her from sleep. Pertinent negatives include no abdominal pain, bloating, chills, increased  flatus, URI or weight loss. Nothing aggravates the symptoms. Risk factors include recent antibiotic use. She has tried anti-motility drug and increased fluids for the symptoms. The treatment provided moderate relief.  cipro used for UTI 04/26/2018. Stool: alternating between diarrhea and constipation. Normal appetite, denies any ABD pain or nausea, no weight loss noted   Daughter report LOC after using bathroom x 2 episodes in last 2weeks, First episode lasted about 10-19mins, second episode last <50mins.  ECG done in hospital (no acute finding)  HTN: Home BP readings:130s/80s Current use of amlodipine. No LE edema. BP Readings from Last 3 Encounters:  07/27/18 122/76  07/09/18 (!) 162/96  04/26/18 96/70    Reviewed past Medical, Social and Family history today.  Outpatient Medications Prior to Visit  Medication Sig Dispense Refill  . acetaminophen (TYLENOL) 325 MG tablet Take 325 mg by mouth daily as needed (pain).    Marland Kitchen alendronate (FOSAMAX) 70 MG tablet TAKE 1 TABLET(70 MG) BY MOUTH EVERY 7 DAYS WITH A FULL GLASS OF WATER AND ON AN EMPTY STOMACH 12 tablet 3  . amLODipine (NORVASC) 5 MG tablet TAKE 1 TABLET(5 MG) BY MOUTH DAILY 90 tablet 0  . atorvastatin (LIPITOR) 20 MG tablet TAKE 1 TABLET BY MOUTH EVERY DAY FOR CHOLESTEROL 90  tablet 3  . Calcium Carb-Cholecalciferol (CALCIUM 600 + D PO) Take by mouth. BID--otc    . Cholecalciferol (VITAMIN D3 PO) Take 1,000 Units by mouth. Daily-otc    . Memantine HCl-Donepezil HCl (NAMZARIC) 28-10 MG CP24 Take 1 capsule by mouth daily. 30 capsule 3  . Multiple Vitamins-Minerals (MULTIVITAMIN WITH MINERALS) tablet Take 1 tablet by mouth daily with supper. Centrum Silver    . nystatin (MYCOSTATIN/NYSTOP) powder Apply topically 2 (two) times daily. To groin and abdomen area 45 g 1  . Probiotic Product (PROBIOTIC DAILY PO) Take by mouth. Culturelle--otc    . QUEtiapine (SEROQUEL) 100 MG tablet TAKE 1 TABLET BY MOUTH EVERY MORNING AND 1 TABLET AT BEDTIME 180 tablet 3  . rivastigmine (EXELON) 13.3 MG/24HR Place 1 patch (13.3 mg total) onto the skin daily. 30 patch 3   No facility-administered medications prior to visit.     ROS See HPI  Objective:  BP 122/76   Pulse 88   Temp 98.4 F (36.9 C) (Oral)   Ht 5\' 7"  (1.702 m)   Wt 191 lb 6.4 oz (86.8 kg)   SpO2 97%   BMI 29.98 kg/m   BP Readings from Last 3 Encounters:  07/27/18 122/76  07/09/18 (!) 162/96  04/26/18 96/70    Wt Readings from Last 3 Encounters:  07/27/18 191 lb 6.4 oz (86.8 kg)  04/26/18 179 lb (81.2 kg)  03/24/18 181 lb (82.1 kg)    Physical Exam Cardiovascular:     Rate and Rhythm: Normal rate and regular rhythm.  Pulmonary:  Effort: Pulmonary effort is normal.     Breath sounds: Normal breath sounds.  Abdominal:     General: Bowel sounds are normal.     Palpations: Abdomen is soft.     Tenderness: There is no abdominal tenderness.  Neurological:     Mental Status: She is alert.     Comments: Oriented to person,and place only     Lab Results  Component Value Date   WBC 11.0 (H) 07/09/2018   HGB 13.0 07/09/2018   HCT 43.1 07/09/2018   PLT 229 07/09/2018   GLUCOSE 123 (H) 07/09/2018   CHOL 174 09/14/2017   TRIG 45 09/14/2017   HDL 67 09/14/2017   LDLCALC 94 09/14/2017   ALT 12  07/09/2018   AST 27 07/09/2018   NA 142 07/09/2018   K 4.4 07/09/2018   CL 108 07/09/2018   CREATININE 1.29 (H) 07/09/2018   BUN 20 07/09/2018   CO2 22 07/09/2018   TSH 1.56 09/14/2017   INR 1.10 09/19/2009   HGBA1C 6.1 (H) 11/11/2017    Dg Chest 2 View  Result Date: 07/09/2018 CLINICAL DATA:  Syncope x2 today. EXAM: CHEST - 2 VIEW COMPARISON:  PA and lateral chest 01/02/2018 in 09/16/2009. CT chest 09/16/2009. FINDINGS: Lung volumes are low with basilar atelectasis. There is cardiomegaly. Tortuous aorta with atherosclerotic vascular disease noted. No acute or focal bony abnormality. IMPRESSION: No acute disease. Cardiomegaly. Atherosclerosis. Electronically Signed   By: Drusilla Kanner M.D.   On: 07/09/2018 18:49    Assessment & Plan:   Nacy was seen today for follow-up.  Diagnoses and all orders for this visit:  Constipation, unspecified constipation type -     DG Abd 1 View -     polyethylene glycol (MIRALAX / GLYCOLAX) packet; Take 17 g by mouth daily.  Diarrhea, unspecified type -     Stool Culture; Future -     Cancel: C. difficile GDH and Toxin A/B; Future -     Urinalysis w microscopic + reflex cultur -     Cancel: C. difficile GDH and Toxin A/B; Future -     C. difficile GDH and Toxin A/B  Lethargy -     Urinalysis w microscopic + reflex cultur  Vasovagal episode  Other orders -     REFLEXIVE URINE CULTURE -     Urine Culture   I am having Wynelle Cleveland. Clinton Sawyer start on polyethylene glycol. I am also having her maintain her multivitamin with minerals, alendronate, QUEtiapine, acetaminophen, nystatin, atorvastatin, Memantine HCl-Donepezil HCl, rivastigmine, amLODipine, Cholecalciferol (VITAMIN D3 PO), Calcium Carb-Cholecalciferol (CALCIUM 600 + D PO), and Probiotic Product (PROBIOTIC DAILY PO).  Meds ordered this encounter  Medications  . polyethylene glycol (MIRALAX / GLYCOLAX) packet    Sig: Take 17 g by mouth daily.    Dispense:  14 each    Refill:   0    Order Specific Question:   Supervising Provider    Answer:   Dianne Dun [[id number]]    Problem List Items Addressed This Visit      Other   Constipation - Primary   Relevant Medications   polyethylene glycol (MIRALAX / GLYCOLAX) packet   Other Relevant Orders   DG Abd 1 View (Completed)    Other Visit Diagnoses    Diarrhea, unspecified type       Relevant Orders   Stool Culture   Urinalysis w microscopic + reflex cultur (Completed)   C. difficile GDH and Toxin A/B   Lethargy  Relevant Orders   Urinalysis w microscopic + reflex cultur (Completed)   Vasovagal episode           Follow-up: Return in about 8 days (around 08/04/2018) for constipation and syncopal episode.  Alysia Pennaharlotte Koleson Reifsteck, NP

## 2018-07-27 NOTE — Patient Instructions (Addendum)
ABD x-ray indicates Bowel full of stool which will explain bowel pattern. Start miralax 17g once a day and adequate oral hydration. Abnormal urinalysis with pending urine culture  Take stool collection kit home.  Bring stool sample to lab when collected.  ABD x-ray indicates constipation which will explain bowel pattern. Use miralax as prescribed. Urine culture indicates multiple bacteria. No oral abx needed F/up next week.  Push adequate oral hydration with water and Gatorade.  Continue to monitor BP at home. Call office if BP<110/70x 2days.  Consider referral to cardiology for eval syncopal episode?  Rehydration, Adult Rehydration is the replacement of body fluids and salts and minerals (electrolytes) that are lost during dehydration. Dehydration is when there is not enough fluid or water in the body. This happens when you lose more fluids than you take in. Common causes of dehydration include:  Vomiting.  Diarrhea.  Excessive sweating, such as from heat exposure or exercise.  Taking medicines that cause the body to lose excess fluid (diuretics).  Impaired kidney function.  Not drinking enough fluid.  Certain illnesses or infections.  Certain poorly controlled long-term (chronic) illnesses, such as diabetes, heart disease, and kidney disease.  Symptoms of mild dehydration may include thirst, dry lips and mouth, dry skin, and dizziness. Symptoms of severe dehydration may include increased heart rate, confusion, fainting, and not urinating. You can rehydrate by drinking certain fluids or getting fluids through an IV tube, as told by your health care provider. What are the risks? Generally, rehydration is safe. However, one problem that can happen is taking in too much fluid (overhydration). This is rare. If overhydration happens, it can cause an electrolyte imbalance, kidney failure, or a decrease in salt (sodium) levels in the body. How to rehydrate Follow instructions  from your health care provider for rehydration. The kind of fluid you should drink and the amount you should drink depend on your condition.  If directed by your health care provider, drink an oral rehydration solution (ORS). This is a drink designed to treat dehydration that is found in pharmacies and retail stores. ? Make an ORS by following instructions on the package. ? Start by drinking small amounts, about  cup (120 mL) every 5-10 minutes. ? Slowly increase how much you drink until you have taken the amount recommended by your health care provider.  Drink enough clear fluids to keep your urine clear or pale yellow. If you were instructed to drink an ORS, finish the ORS first, then start slowly drinking other clear fluids. Drink fluids such as: ? Water. Do not drink only water. Doing that can lead to having too little sodium in your body (hyponatremia). ? Ice chips. ? Fruit juice that you have added water to (diluted juice). ? Low-calorie sports drinks.  If you are severely dehydrated, your health care provider may recommend that you receive fluids through an IV tube in the hospital.  Do not take sodium tablets. Doing that can lead to the condition of having too much sodium in your body (hypernatremia). Eating while you rehydrate Follow instructions from your health care provider about what to eat while you rehydrate. Your health care provider may recommend that you slowly begin eating regular foods in small amounts.  Eat foods that contain a healthy balance of electrolytes, such as bananas, oranges, potatoes, tomatoes, and spinach.  Avoid foods that are greasy or contain a lot of fat or sugar.  In some cases, you may get nutrition through a feeding tube that is  passed through your nose and into your stomach (nasogastric tube, or NG tube). This may be done if you have uncontrolled vomiting or diarrhea. Beverages to avoid Certain beverages may make dehydration worse. While you  rehydrate, avoid:  Alcohol.  Caffeine.  Drinks that contain a lot of sugar. These include: ? High-calorie sports drinks. ? Fruit juice that is not diluted. ? Soda.  Check nutrition labels to see how much sugar or caffeine a beverage contains. Signs of dehydration recovery You may be recovering from dehydration if:  You are urinating more often than before you started rehydrating.  Your urine is clear or pale yellow.  Your energy level improves.  You vomit less frequently.  You have diarrhea less frequently.  Your appetite improves or returns to normal.  You feel less dizzy or less light-headed.  Your skin tone and color start to look more normal. Contact a health care provider if:  You continue to have symptoms of mild dehydration, such as: ? Thirst. ? Dry lips. ? Slightly dry mouth. ? Dry, warm skin. ? Dizziness.  You continue to vomit or have diarrhea. Get help right away if:  You have symptoms of dehydration that get worse.  You feel: ? Confused. ? Weak. ? Like you are going to faint.  You have not urinated in 6-8 hours.  You have very dark urine.  You have trouble breathing.  Your heart rate while sitting still is over 100 beats a minute.  You cannot drink fluids without vomiting.  You have vomiting or diarrhea that: ? Gets worse. ? Does not go away.  You have a fever. This information is not intended to replace advice given to you by your health care provider. Make sure you discuss any questions you have with your health care provider. Document Released: 08/18/2011 Document Revised: 12/14/2015 Document Reviewed: 07/20/2015 Elsevier Interactive Patient Education  2019 [employer].

## 2018-07-28 MED ORDER — POLYETHYLENE GLYCOL 3350 17 G PO PACK: 17.0000 g | PACK | Freq: Every day | ORAL | 0 refills | Status: DC

## 2018-07-29 ENCOUNTER — Telehealth: Payer: Self-pay | Admitting: Nurse Practitioner

## 2018-07-29 LAB — URINALYSIS W MICROSCOPIC + REFLEX CULTURE
Bilirubin Urine: NEGATIVE
GLUCOSE, UA: NEGATIVE
Hgb urine dipstick: NEGATIVE
Hyaline Cast: NONE SEEN /LPF
Nitrites, Initial: NEGATIVE
Specific Gravity, Urine: 1.02 (ref 1.001–1.03)
pH: 7 (ref 5.0–8.0)

## 2018-07-29 LAB — CULTURE INDICATED

## 2018-07-29 LAB — URINE CULTURE
MICRO NUMBER:: 214613
SPECIMEN QUALITY:: ADEQUATE

## 2018-07-29 NOTE — Telephone Encounter (Signed)
Abnormal urinalysis, but urine culture indicates colonization of multiple bacteria which is part of normal flora. No oral abx needed at this time. F/up with me next week

## 2018-07-30 ENCOUNTER — Encounter: Payer: Self-pay | Admitting: Nurse Practitioner

## 2018-07-30 ENCOUNTER — Other Ambulatory Visit: Payer: Medicare Other

## 2018-07-30 DIAGNOSIS — R197 Diarrhea, unspecified: Secondary | ICD-10-CM

## 2018-07-30 NOTE — Telephone Encounter (Signed)
Pt's daughter is aware. appt made for next week.

## 2018-07-31 LAB — C. DIFFICILE GDH AND TOXIN A/B
GDH ANTIGEN: NOT DETECTED
MICRO NUMBER:: 226792
SPECIMEN QUALITY:: ADEQUATE
TOXIN A AND B: NOT DETECTED

## 2018-08-02 ENCOUNTER — Encounter: Payer: Self-pay | Admitting: Family

## 2018-08-04 ENCOUNTER — Encounter: Payer: Self-pay | Admitting: Nurse Practitioner

## 2018-08-04 ENCOUNTER — Ambulatory Visit (INDEPENDENT_AMBULATORY_CARE_PROVIDER_SITE_OTHER): Payer: Medicare Other | Admitting: Nurse Practitioner

## 2018-08-04 VITALS — BP 100/60 | HR 100 | Temp 98.6°F | Ht 67.0 in | Wt 192.6 lb

## 2018-08-04 DIAGNOSIS — K5900 Constipation, unspecified: Secondary | ICD-10-CM | POA: Diagnosis not present

## 2018-08-04 DIAGNOSIS — R0602 Shortness of breath: Secondary | ICD-10-CM

## 2018-08-04 DIAGNOSIS — I10 Essential (primary) hypertension: Secondary | ICD-10-CM

## 2018-08-04 DIAGNOSIS — R55 Syncope and collapse: Secondary | ICD-10-CM

## 2018-08-04 LAB — STOOL CULTURE
MICRO NUMBER:: 226618
MICRO NUMBER:: 226619
MICRO NUMBER:: 226620
SHIGA RESULT:: NOT DETECTED
SPECIMEN QUALITY: ADEQUATE
SPECIMEN QUALITY:: ADEQUATE
SPECIMEN QUALITY:: ADEQUATE

## 2018-08-04 NOTE — Patient Instructions (Addendum)
Decrease amlodipine to 2.5mg  once a day.  Use colace 100mg  once a day for constipation Hold miralax for now, use if no BM for >2days.  Return to office on Friday with BP machine.  You will be contacted to schedule appt with cardiology

## 2018-08-04 NOTE — Progress Notes (Signed)
Subjective:  Patient ID: Carol [name], female    DOB: 1936-01-23  Age: 83 y.o. MRN: [id number]  CC: Follow-up (f/u on constipation, and episode/ no episodes and is going to restroom)   HPI  Accompanied by 2daughters today.  Constipation: Daughter report improved BM with use of miralax, resolved diarrhea. They report persistent SOB with minimal exertion. No cough, no PND, no edema, no further syncopal episodes. No change in appetite. Ms. Hara denies any chest pain or dizziness or nausea or palpitations.  HTN: Home BP reading range from 110/70 - 140/80. Current use of amlodipine. BP Readings from Last 3 Encounters:  08/04/18 100/60  07/27/18 122/76  07/09/18 (!) 162/96   Reviewed past Medical, Social and Family history today.  Outpatient Medications Prior to Visit  Medication Sig Dispense Refill  . acetaminophen (TYLENOL) 325 MG tablet Take 325 mg by mouth daily as needed (pain).    Marland Kitchen alendronate (FOSAMAX) 70 MG tablet TAKE 1 TABLET(70 MG) BY MOUTH EVERY 7 DAYS WITH A FULL GLASS OF WATER AND ON AN EMPTY STOMACH 12 tablet 3  . amLODipine (NORVASC) 5 MG tablet Take 0.5 tablets (2.5 mg total) by mouth daily. 90 tablet 0  . atorvastatin (LIPITOR) 20 MG tablet TAKE 1 TABLET BY MOUTH EVERY DAY FOR CHOLESTEROL 90 tablet 3  . Calcium Carb-Cholecalciferol (CALCIUM 600 + D PO) Take by mouth. BID--otc    . Cholecalciferol (VITAMIN D3 PO) Take 1,000 Units by mouth. Daily-otc    . Memantine HCl-Donepezil HCl (NAMZARIC) 28-10 MG CP24 Take 1 capsule by mouth daily. 30 capsule 3  . Multiple Vitamins-Minerals (MULTIVITAMIN WITH MINERALS) tablet Take 1 tablet by mouth daily with supper. Centrum Silver    . nystatin (MYCOSTATIN/NYSTOP) powder Apply topically 2 (two) times daily. To groin and abdomen area 45 g 1  . polyethylene glycol (MIRALAX / GLYCOLAX) packet Take 17 g by mouth daily. 14 each 0  . Probiotic Product (PROBIOTIC DAILY PO) Take by mouth. Culturelle--otc    . QUEtiapine  (SEROQUEL) 100 MG tablet TAKE 1 TABLET BY MOUTH EVERY MORNING AND 1 TABLET AT BEDTIME 180 tablet 3  . rivastigmine (EXELON) 13.3 MG/24HR Place 1 patch (13.3 mg total) onto the skin daily. 30 patch 3  . amLODipine (NORVASC) 5 MG tablet TAKE 1 TABLET(5 MG) BY MOUTH DAILY 90 tablet 0   No facility-administered medications prior to visit.     ROS See HPI  Objective:  BP 100/60   Pulse 100   Temp 98.6 F (37 C) (Oral)   Ht 5\' 7"  (1.702 m)   Wt 192 lb 9.6 oz (87.4 kg)   SpO2 96%   BMI 30.17 kg/m   BP Readings from Last 3 Encounters:  08/04/18 100/60  07/27/18 122/76  07/09/18 (!) 162/96    Wt Readings from Last 3 Encounters:  08/04/18 192 lb 9.6 oz (87.4 kg)  07/27/18 191 lb 6.4 oz (86.8 kg)  04/26/18 179 lb (81.2 kg)    Physical Exam Vitals signs reviewed.  Cardiovascular:     Rate and Rhythm: Normal rate and regular rhythm.  Pulmonary:     Effort: Pulmonary effort is normal.     Breath sounds: Normal breath sounds.  Abdominal:     General: Bowel sounds are normal. There is no distension.     Palpations: Abdomen is soft.     Tenderness: There is no abdominal tenderness.  Musculoskeletal:     Right lower leg: No edema.     Left lower leg: No  edema.  Neurological:     Mental Status: She is alert.     Comments: Oriented to person, place and family only  Psychiatric:        Mood and Affect: Mood normal.        Behavior: Behavior normal.     Lab Results  Component Value Date   WBC 11.0 (H) 07/09/2018   HGB 13.0 07/09/2018   HCT 43.1 07/09/2018   PLT 229 07/09/2018   GLUCOSE 123 (H) 07/09/2018   CHOL 174 09/14/2017   TRIG 45 09/14/2017   HDL 67 09/14/2017   LDLCALC 94 09/14/2017   ALT 12 07/09/2018   AST 27 07/09/2018   NA 142 07/09/2018   K 4.4 07/09/2018   CL 108 07/09/2018   CREATININE 1.29 (H) 07/09/2018   BUN 20 07/09/2018   CO2 22 07/09/2018   TSH 1.56 09/14/2017   INR 1.10 09/19/2009   HGBA1C 6.1 (H) 11/11/2017    Dg Chest 2 View  Result  Date: 07/09/2018 CLINICAL DATA:  Syncope x2 today. EXAM: CHEST - 2 VIEW COMPARISON:  PA and lateral chest 01/02/2018 in 09/16/2009. CT chest 09/16/2009. FINDINGS: Lung volumes are low with basilar atelectasis. There is cardiomegaly. Tortuous aorta with atherosclerotic vascular disease noted. No acute or focal bony abnormality. IMPRESSION: No acute disease. Cardiomegaly. Atherosclerosis. Electronically Signed   By: Drusilla Kanner M.D.   On: 07/09/2018 18:49    Assessment & Plan:   Brayli was seen today for follow-up.  Diagnoses and all orders for this visit:  Constipation, unspecified constipation type  Essential hypertension, benign  SOB (shortness of breath) on exertion -     Ambulatory referral to Cardiology  Vasovagal episode -     Ambulatory referral to Cardiology   I have changed Wynelle Cleveland. Claire's amLODipine. I am also having her maintain her multivitamin with minerals, alendronate, QUEtiapine, acetaminophen, nystatin, atorvastatin, Memantine HCl-Donepezil HCl, rivastigmine, Cholecalciferol (VITAMIN D3 PO), Calcium Carb-Cholecalciferol (CALCIUM 600 + D PO), Probiotic Product (PROBIOTIC DAILY PO), and polyethylene glycol.  No orders of the defined types were placed in this encounter.   Problem List Items Addressed This Visit      Cardiovascular and Mediastinum   Essential hypertension, benign   Relevant Medications   amLODipine (NORVASC) 5 MG tablet     Other   Constipation - Primary    Other Visit Diagnoses    SOB (shortness of breath) on exertion       Relevant Orders   Ambulatory referral to Cardiology   Vasovagal episode       Relevant Medications   amLODipine (NORVASC) 5 MG tablet   Other Relevant Orders   Ambulatory referral to Cardiology       Follow-up: Return in about 2 days (around 08/06/2018) for BP check.  Alysia Penna, NP

## 2018-08-05 ENCOUNTER — Encounter: Payer: Self-pay | Admitting: Nurse Practitioner

## 2018-09-02 ENCOUNTER — Telehealth: Payer: Self-pay

## 2018-09-02 NOTE — Telephone Encounter (Signed)
Charlotte please advise, daughter is worry since we will on a lock down this Friday. Home health agency and family members go stay with Mrs. Robledo throughout the day, so they need this letter to cover them and to make sure the patient gets the care she needs.   FYI--pt is hope to get this letter Friday morning if possible.

## 2018-09-02 NOTE — Telephone Encounter (Signed)
Copied from CRM [id number]. Topic: General - Inquiry >> Sep 02, 2018  8:32 AM Retta Diones wrote: Reason for CRM: Pt's daughter called wanting a letter stating that her mother needs 24 hour care. The reason she needs this, the company, [employer], cares for her mom. With all the stay-home orders, they just need a letter to give them just in case they are questioned.

## 2018-09-03 ENCOUNTER — Encounter: Payer: Self-pay | Admitting: Nurse Practitioner

## 2018-09-03 NOTE — Telephone Encounter (Signed)
Letter written and signed.

## 2018-09-06 NOTE — Telephone Encounter (Signed)
Daughter is coming to pick this up today. Screen daughter already and she is aware of the mask.

## 2018-09-06 NOTE — Telephone Encounter (Signed)
ok 

## 2018-09-13 ENCOUNTER — Other Ambulatory Visit: Payer: Self-pay | Admitting: Nurse Practitioner

## 2018-09-13 DIAGNOSIS — I10 Essential (primary) hypertension: Secondary | ICD-10-CM

## 2018-09-23 ENCOUNTER — Telehealth: Payer: Self-pay

## 2018-09-23 NOTE — Telephone Encounter (Signed)
Virtual Visit Pre-Appointment Phone Call  Steps For Call:  1. Confirm consent - "In the setting of the current Covid19 crisis, you are scheduled for a (phone or video) visit with your provider on (date) at (time).  Just as we do with many in-office visits, in order for you to participate in this visit, we must obtain consent.  If you'd like, I can send this to your mychart (if signed up) or email for you to review.  Otherwise, I can obtain your verbal consent now.  All virtual visits are billed to your insurance company just like a normal visit would be.  By agreeing to a virtual visit, we'd like you to understand that the technology does not allow for your provider to perform an examination, and thus may limit your provider's ability to fully assess your condition.  Finally, though the technology is pretty good, we cannot assure that it will always work on either your or our end, and in the setting of a video visit, we may have to convert it to a phone-only visit.  In either situation, we cannot ensure that we have a secure connection.  Are you willing to proceed?" STAFF: Did the patient verbally acknowledge consent to telehealth visit? Document YES/NO here: YES, PATIENT'S DAUGHTER (DPR ON FILE) VERBALLY CONSENT FOR HER MOTHER.   2. Confirm the BEST phone number to call the day of the visit by including in appointment notes  3. Give patient instructions for WebEx/MyChart download to smartphone as below or Doximity/Doxy.me if video visit (depending on what platform provider is using)  4. Advise patient to be prepared with their blood pressure, heart rate, weight, any heart rhythm information, their current medicines, and a piece of paper and pen handy for any instructions they may receive the day of their visit  5. Inform patient they will receive a phone call 15 minutes prior to their appointment time (may be from unknown caller ID) so they should be prepared to answer  6. Confirm that  appointment type is correct in Epic appointment notes (VIDEO vs PHONE)     TELEPHONE CALL NOTE  MINAL KNISS has been deemed a candidate for a follow-up tele-health visit to limit community exposure during the Covid-19 pandemic. I spoke with the patient via phone to ensure availability of phone/video source, confirm preferred email & phone number, and discuss instructions and expectations.  I reminded LOYS CWIKLA to be prepared with any vital sign and/or heart rhythm information that could potentially be obtained via home monitoring, at the time of her visit. I reminded AVILA SANDINE to expect a phone call at the time of her visit if her visit.  Michaelle Copas, CMA 09/23/2018 5:11 PM   INSTRUCTIONS FOR DOWNLOADING THE WEBEX APP TO SMARTPHONE  - If Apple, ask patient to go to [employer] and type in WebEx in the search bar. Download [employer], the blue/green circle. If Android, go to [employer] and type in [employer] in the search bar. The app is free but as with any other app downloads, their phone may require them to verify saved payment information or Apple/Android password.  - The patient does NOT have to create an account. - On the day of the visit, the assist will walk the patient through joining the meeting with the meeting number/password.  INSTRUCTIONS FOR DOWNLOADING THE MYCHART APP TO SMARTPHONE  - The patient must first make sure to have activated MyChart and know their  login information - If Apple, go to [employer] and type in MyChart in the search bar and download the app. If Android, ask patient to go to [employer] Play Store and type in [place] in the search bar and download the app. The app is free but as with any other app downloads, their phone may require them to verify saved payment information or Apple/Android password.  - The patient will need to then log into the app with their MyChart username and password, and select [facility] as their  healthcare provider to link the account. When it is time for your visit, go to the MyChart app, find appointments, and click Begin Video Visit. Be sure to Select Allow for your device to access the Microphone and Camera for your visit. You will then be connected, and your provider will be with you shortly.  **If they have any issues connecting, or need assistance please contact MyChart service desk [phone])**  **If using a computer, in order to ensure the best quality for their visit they will need to use either of the following Internet Browsers: [employer] Edge, or Google Chrome**  IF USING DOXIMITY or DOXY.ME - The patient will receive a link just prior to their visit, either by text or email (to be determined day of appointment depending on if it's doxy.me or Doximity).     FULL LENGTH CONSENT FOR TELE-HEALTH VISIT   I hereby voluntarily request, consent and authorize [facility] and its employed or contracted physicians, physician assistants, nurse practitioners or other licensed health care professionals (the Practitioner), to provide me with telemedicine health care services (the "Services") as deemed necessary by the treating Practitioner. I acknowledge and consent to receive the Services by the Practitioner via telemedicine. I understand that the telemedicine visit will involve communicating with the Practitioner through live audiovisual communication technology and the disclosure of certain medical information by electronic transmission. I acknowledge that I have been given the opportunity to request an in-person assessment or other available alternative prior to the telemedicine visit and am voluntarily participating in the telemedicine visit.  I understand that I have the right to withhold or withdraw my consent to the use of telemedicine in the course of my care at any time, without affecting my right to future care or treatment, and that the Practitioner or I may  terminate the telemedicine visit at any time. I understand that I have the right to inspect all information obtained and/or recorded in the course of the telemedicine visit and may receive copies of available information for a reasonable fee.  I understand that some of the potential risks of receiving the Services via telemedicine include:  Marland Kitchen. Delay or interruption in medical evaluation due to technological equipment failure or disruption; . Information transmitted may not be sufficient (e.g. poor resolution of images) to allow for appropriate medical decision making by the Practitioner; and/or  . In rare instances, security protocols could fail, causing a breach of personal health information.  Furthermore, I acknowledge that it is my responsibility to provide information about my medical history, conditions and care that is complete and accurate to the best of my ability. I acknowledge that Practitioner's advice, recommendations, and/or decision may be based on factors not within their control, such as incomplete or inaccurate data provided by me or distortions of diagnostic images or specimens that may result from electronic transmissions. I understand that the practice of medicine is not an exact science and that Practitioner makes no warranties or  guarantees regarding treatment outcomes. I acknowledge that I will receive a copy of this consent concurrently upon execution via email to the email address I last provided but may also request a printed copy by calling the office of [facility].    I understand that my insurance will be billed for this visit.   I have read or had this consent read to me. . I understand the contents of this consent, which adequately explains the benefits and risks of the Services being provided via telemedicine.  . I have been provided ample opportunity to ask questions regarding this consent and the Services and have had my questions answered to my satisfaction. . I  give my informed consent for the services to be provided through the use of telemedicine in my medical care  By participating in this telemedicine visit I agree to the above.

## 2018-09-24 NOTE — Progress Notes (Signed)
Virtual Visit via Video Note   This visit type was conducted due to national recommendations for restrictions regarding the COVID-19 Pandemic (e.g. social distancing) in an effort to limit this patient's exposure and mitigate transmission in our community.  Due to her co-morbid illnesses, this patient is at least at moderate risk for complications without adequate follow up.  This format is felt to be most appropriate for this patient at this time.  All issues noted in this document were discussed and addressed.  A limited physical exam was performed with this format.  Please refer to the patient's chart for her consent to telehealth for [facility].   Evaluation Performed:  Follow-up visit  Date:  09/27/2018   ID:  Carol MayotteCarolyn M Bilotta, DOB [birth year], MRN [id number]  Patient Location: Home Provider Location: Office  PCP:  Anne NgNche, Charlotte Lum, NP  Cardiologist:  Eden EmmsNishan Electrophysiologist:  None   Chief Complaint:  Exertional dyspnea Syncope  History of Present Illness:    Carol IgoCarolyn M [name] is a 83 y.o. female with history of HTN. Has had issues with constipation and diarrhea ER visit 07/09/18 for LOC and again 07/16/18 . Both episodes occurred after going to bathroom. Change MS lasting about 5-15 minutes In context of diarrhea in diaper every day for 2-3 weeks. Had UTI before that Rx with iv hydration and thiamene D/c Telemetry normal r/o CXR NAD C difficile negative No cardiac history no palpitations or chest pain Some dyspnea with exertion   Seems like the real issue is difficulty with BM's and hard getting OOB in am Now she seems to sweat and have higher HR getting up in am then when she "calms down" rest of the day is ok  4 daughters and husband currently in [facility] for ortho issues   No chest Pain   The patient does not have symptoms concerning for COVID-19 infection (fever, chills, cough, or new shortness of breath).    Past Medical History:  Diagnosis Date  . Anxiety  state, unspecified   . Cataract   . Depressive disorder, not elsewhere classified   . Lumbago   . Memory loss   . Other and unspecified hyperlipidemia   . Rickets, late effect   . Tobacco use disorder   . Unspecified constipation   . Unspecified essential hypertension   . Varicose veins of lower extremities    Past Surgical History:  Procedure Laterality Date  . CYST EXCISION Bilateral    behind ears  . MOUTH SURGERY  04/12/2014     Current Meds  Medication Sig  . acetaminophen (TYLENOL) 325 MG tablet Take 325 mg by mouth daily as needed (pain).  Marland Kitchen. alendronate (FOSAMAX) 70 MG tablet TAKE 1 TABLET(70 MG) BY MOUTH EVERY 7 DAYS WITH A FULL GLASS OF WATER AND ON AN EMPTY STOMACH  . amLODipine (NORVASC) 5 MG tablet TAKE 1 TABLET BY MOUTH DAILY  . atorvastatin (LIPITOR) 20 MG tablet TAKE 1 TABLET BY MOUTH EVERY DAY FOR CHOLESTEROL  . Calcium Carb-Cholecalciferol (CALCIUM 600 + D PO) Take by mouth. BID--otc  . Cholecalciferol (VITAMIN D3 PO) Take 1,000 Units by mouth. Daily-otc  . Memantine HCl-Donepezil HCl (NAMZARIC) 28-10 MG CP24 Take 1 capsule by mouth daily.  . Multiple Vitamins-Minerals (MULTIVITAMIN WITH MINERALS) tablet Take 1 tablet by mouth daily with supper. Centrum Silver  . nystatin (MYCOSTATIN/NYSTOP) powder Apply topically 2 (two) times daily. To groin and abdomen area  . polyethylene glycol (MIRALAX / GLYCOLAX) packet Take 17 g by mouth daily.  .Marland Kitchen  Probiotic Product (PROBIOTIC DAILY PO) Take by mouth. Culturelle--otc  . QUEtiapine (SEROQUEL) 100 MG tablet TAKE 1 TABLET BY MOUTH EVERY MORNING AND 1 TABLET AT BEDTIME  . rivastigmine (EXELON) 13.3 MG/24HR Place 1 patch (13.3 mg total) onto the skin daily.     Allergies:   Penicillins   Social History   Tobacco Use  . Smoking status: Former Smoker    Years: 55.00    Last attempt to quit: 05/09/2017    Years since quitting: 1.3  . Smokeless tobacco: Never Used  Substance Use Topics  . Alcohol use: No    Alcohol/week:  0.0 standard drinks  . Drug use: No     Family Hx: The patient's family history includes Heart disease in her father.  ROS:   Please see the history of present illness.     All other systems reviewed and are negative.   Prior CV studies:   The following studies were reviewed today:  ER visits January / February ECG  Labs/Other Tests and Data Reviewed:    EKG:   SR insignificant Q waves inferiorly poor R wave progression   Recent Labs: 01/06/2018: Magnesium 2.0 07/09/2018: ALT 12; BUN 20; Creatinine, Ser 1.29; Hemoglobin 13.0; Platelets 229; Potassium 4.4; Sodium 142   Recent Lipid Panel Lab Results  Component Value Date/Time   CHOL 174 09/14/2017 09:29 AM   CHOL 152 10/19/2014 08:15 AM   TRIG 45 09/14/2017 09:29 AM   HDL 67 09/14/2017 09:29 AM   HDL 65 10/19/2014 08:15 AM   CHOLHDL 2.6 09/14/2017 09:29 AM   LDLCALC 94 09/14/2017 09:29 AM    Wt Readings from Last 3 Encounters:  09/27/18 87.1 kg  08/04/18 87.4 kg  07/27/18 86.8 kg     Objective:    Vital Signs:  BP 128/89   Pulse 83   Ht 5\' 7"  (1.702 m)   Wt 87.1 kg   BMI 30.07 kg/m    Obese black femlae No tachypnea No JVP elevation  Skin warm and dry No edema  ASSESSMENT & PLAN:    Syncope:  Likely vasovagal in setting of age, dia rhea dehydration Doubt arrhythmia will order echo to make sure no structural heart disease given age and abnormal ECG  Dyspnea: functional CXR ok see above echo for RV/LV function   HTN:  Continue current meds not postural   HLD:  Continue statins labs with primary   COVID-19 Education: The signs and symptoms of COVID-19 were discussed with the patient and how to seek care for testing (follow up with PCP or arrange E-visit).  The importance of social distancing was discussed today.  Time:   Today, I have spent 30 minutes with the patient with telehealth technology discussing the above problems.     Medication Adjustments/Labs and Tests Ordered: Current medicines are  reviewed at length with the patient today.  Concerns regarding medicines are outlined above.   Tests Ordered:  Echo  Medication Changes: No orders of the defined types were placed in this encounter.   Disposition:  Follow up PRN  Signed, Charlton Haws, MD  09/27/2018 7:56 AM    [facility]

## 2018-09-27 ENCOUNTER — Other Ambulatory Visit: Payer: Self-pay

## 2018-09-27 ENCOUNTER — Encounter: Payer: Self-pay | Admitting: Cardiovascular Disease

## 2018-09-27 ENCOUNTER — Telehealth (INDEPENDENT_AMBULATORY_CARE_PROVIDER_SITE_OTHER): Payer: Medicare Other | Admitting: Cardiovascular Disease

## 2018-09-27 VITALS — BP 128/89 | HR 83 | Ht 67.0 in | Wt 192.0 lb

## 2018-09-27 DIAGNOSIS — R55 Syncope and collapse: Secondary | ICD-10-CM | POA: Diagnosis not present

## 2018-09-27 NOTE — Patient Instructions (Addendum)
Medication Instructions:   If you need a refill on your cardiac medications before your next appointment, please call your pharmacy.   Lab work:  If you have labs (blood work) drawn today and your tests are completely normal, you will receive your results only by: Marland Kitchen MyChart Message (if you have MyChart) OR . A paper copy in the mail If you have any lab test that is abnormal or we need to change your treatment, we will call you to review the results.  Testing/Procedures: Your physician has requested that you have an echocardiogram. Echocardiography is a painless test that uses sound waves to create images of your heart. It provides your doctor with information about the size and shape of your heart and how well your heart's chambers and valves are working. This procedure takes approximately one hour. There are no restrictions for this procedure.  Follow-Up: At [facility], you and your health needs are our priority.  As part of our continuing mission to provide you with exceptional heart care, we have created designated Provider Care Teams.  These Care Teams include your primary Cardiologist (physician) and Advanced Practice Providers (APPs -  Physician Assistants and Nurse Practitioners) who all work together to provide you with the care you need, when you need it. You will need a follow up appointment as needed with Dr. Eden Emms, unless your echocardiogram is abnormal.  Someone from our office will call you to make an appointment for the echocardiogram.

## 2018-11-11 ENCOUNTER — Other Ambulatory Visit: Payer: Self-pay | Admitting: Nurse Practitioner

## 2018-11-11 DIAGNOSIS — F0150 Vascular dementia without behavioral disturbance: Secondary | ICD-10-CM

## 2018-11-11 NOTE — Telephone Encounter (Signed)
Charlotte please advise las OV for this issue was 04/2018

## 2018-11-12 NOTE — Telephone Encounter (Signed)
Please call to schedule video appt

## 2018-11-15 ENCOUNTER — Ambulatory Visit: Payer: Self-pay

## 2018-11-15 ENCOUNTER — Encounter: Payer: Self-pay | Admitting: Family

## 2018-11-15 ENCOUNTER — Encounter: Payer: Medicare Other | Admitting: Family

## 2018-11-16 ENCOUNTER — Encounter: Payer: Medicare Other | Admitting: Family

## 2018-11-16 ENCOUNTER — Encounter: Payer: Self-pay | Admitting: Nurse Practitioner

## 2018-11-16 ENCOUNTER — Ambulatory Visit (INDEPENDENT_AMBULATORY_CARE_PROVIDER_SITE_OTHER): Payer: Medicare Other | Admitting: Nurse Practitioner

## 2018-11-16 VITALS — BP 135/94 | HR 81 | Ht 67.0 in | Wt 193.0 lb

## 2018-11-16 DIAGNOSIS — F0281 Dementia in other diseases classified elsewhere with behavioral disturbance: Secondary | ICD-10-CM

## 2018-11-16 DIAGNOSIS — F0150 Vascular dementia without behavioral disturbance: Secondary | ICD-10-CM

## 2018-11-16 DIAGNOSIS — G309 Alzheimer's disease, unspecified: Secondary | ICD-10-CM

## 2018-11-16 DIAGNOSIS — F0280 Dementia in other diseases classified elsewhere without behavioral disturbance: Secondary | ICD-10-CM

## 2018-11-16 DIAGNOSIS — E782 Mixed hyperlipidemia: Secondary | ICD-10-CM

## 2018-11-16 DIAGNOSIS — I10 Essential (primary) hypertension: Secondary | ICD-10-CM

## 2018-11-16 DIAGNOSIS — M8588 Other specified disorders of bone density and structure, other site: Secondary | ICD-10-CM

## 2018-11-16 MED ORDER — ALENDRONATE SODIUM 70 MG PO TABS: 70.0000 mg | ORAL_TABLET | ORAL | 1 refills | Status: DC

## 2018-11-16 MED ORDER — ATORVASTATIN CALCIUM 20 MG PO TABS: ORAL_TABLET | ORAL | 1 refills | Status: DC

## 2018-11-16 MED ORDER — AMLODIPINE BESYLATE 2.5 MG PO TABS: 2.5000 mg | ORAL_TABLET | Freq: Every day | ORAL | 1 refills | Status: DC

## 2018-11-16 MED ORDER — MEMANTINE HCL-DONEPEZIL HCL ER 28-10 MG PO CP24: 1.0000 | ORAL_CAPSULE | Freq: Every day | ORAL | 1 refills | Status: DC

## 2018-11-16 MED ORDER — QUETIAPINE FUMARATE 100 MG PO TABS: ORAL_TABLET | ORAL | 1 refills | Status: DC

## 2018-11-16 MED ORDER — RIVASTIGMINE 13.3 MG/24HR TD PT24: 13.3000 mg | MEDICATED_PATCH | Freq: Every day | TRANSDERMAL | 1 refills | Status: DC

## 2018-11-16 NOTE — Progress Notes (Signed)
Virtual Visit via Video Note  I connected with Carol [name] on 11/16/18 at  3:30 PM EDT by a video enabled telemedicine application and verified that I am speaking with the correct person using two identifiers.  Location: Patient: Home with daughter-Carol [name] Provider: Office   I discussed the limitations of evaluation and management by telemedicine and the availability of in person appointments. The patient expressed understanding and agreed to proceed.  History of Present Illness: Dementia: Stable mood per daughter. Current use of namzeric, seroquel, and exelon patch. Needs assist with ADLs (coaching and reminders), able to feed self.  HTN: Home readings: 110s-120s/80s Taking amlodipine 2.5mg  BP Readings from Last 3 Encounters:  11/16/18 (!) 135/94  09/27/18 128/89  08/04/18 100/60   Wt Readings from Last 3 Encounters:  11/16/18 193 lb (87.5 kg)  09/27/18 192 lb (87.1 kg)  08/04/18 192 lb 9.6 oz (87.4 kg)   Osteoporosis: Last dexa scan 06/2018: osteopenia Current use of fosamax.  Review of Systems  Constitutional: Negative.   Respiratory:       SOB with exertion: chronic.  Cardiovascular: Negative for palpitations and orthopnea.  Gastrointestinal: Negative for abdominal pain, constipation, diarrhea and nausea.  Musculoskeletal: Negative for falls, joint pain and myalgias.  Neurological: Negative for dizziness and headaches.  Psychiatric/Behavioral: Negative for hallucinations and suicidal ideas. The patient is not nervous/anxious and does not have insomnia.    Observations/Objective: Physical Exam  Neck: Normal range of motion. Neck supple.  Pulmonary/Chest: Effort normal.  Neurological: She is alert.  Oriented to person, place and daughter  Psychiatric: She has a normal mood and affect. Her behavior is normal.  Vitals reviewed.  Assessment and Plan: Carol [name] was seen today for follow-up.  Diagnoses and all orders for this visit:  Mixed Alzheimer's and  vascular dementia ([place]) -     QUEtiapine (SEROQUEL) 100 MG tablet; TAKE 1 TABLET BY MOUTH EVERY MORNING AND 1 TABLET AT BEDTIME -     rivastigmine (EXELON) 13.3 MG/24HR; Place 1 patch (13.3 mg total) onto the skin daily. -     Memantine HCl-Donepezil HCl (NAMZARIC) 28-10 MG CP24; Take 1 capsule by mouth daily.  Alzheimer's dementia with behavioral disturbance, unspecified timing of dementia onset ([place])  Osteopenia -     alendronate (FOSAMAX) 70 MG tablet; Take 1 tablet (70 mg total) by mouth once a week. Take with a full glass of water on an empty stomach.  Mixed hyperlipidemia -     atorvastatin (LIPITOR) 20 MG tablet; TAKE 1 TABLET BY MOUTH EVERY DAY FOR CHOLESTEROL  Essential hypertension, benign -     amLODipine (NORVASC) 2.5 MG tablet; Take 1 tablet (2.5 mg total) by mouth daily.   Follow Up Instructions: Continue current medications. F/up in 50months (F2F) Call office sooner if BP>140/90 for more than 1week   I discussed the assessment and treatment plan with the patient. The patient was provided an opportunity to ask questions and all were answered. The patient agreed with the plan and demonstrated an understanding of the instructions.   The patient was advised to call back or seek an in-person evaluation if the symptoms worsen or if the condition fails to improve as anticipated.   Wilfred Lacy, NP

## 2018-12-29 ENCOUNTER — Other Ambulatory Visit (HOSPITAL_COMMUNITY): Payer: Medicare Other

## 2019-01-25 ENCOUNTER — Ambulatory Visit (HOSPITAL_COMMUNITY): Payer: Medicare Other | Attending: Cardiovascular Disease

## 2019-01-25 ENCOUNTER — Other Ambulatory Visit: Payer: Self-pay

## 2019-01-25 DIAGNOSIS — R55 Syncope and collapse: Secondary | ICD-10-CM | POA: Diagnosis not present

## 2019-02-15 ENCOUNTER — Ambulatory Visit: Payer: Medicare Other | Admitting: Nurse Practitioner

## 2019-02-16 ENCOUNTER — Other Ambulatory Visit: Payer: Self-pay | Admitting: Nurse Practitioner

## 2019-02-16 DIAGNOSIS — I10 Essential (primary) hypertension: Secondary | ICD-10-CM

## 2019-02-22 ENCOUNTER — Telehealth: Payer: Self-pay | Admitting: Nurse Practitioner

## 2019-02-22 NOTE — Telephone Encounter (Signed)
Questions for Screening COVID-19  Symptom onset: n/a  Travel or Contacts: no  During this illness, did/does the patient experience any of the following symptoms? Fever >100.63F []   Yes [x]   No []   Unknown Subjective fever (felt feverish) []   Yes [x]   No []   Unknown Chills []   Yes [x]   No []   Unknown Muscle aches (myalgia) []   Yes [x]   No []   Unknown Runny nose (rhinorrhea) []   Yes [x]   No []   Unknown Sore throat []   Yes [x]   No []   Unknown Cough (new onset or worsening of chronic cough) []   Yes [x]   No []   Unknown Shortness of breath (dyspnea) []   Yes [x]   No []   Unknown Nausea or vomiting []   Yes [x]   No []   Unknown Headache []   Yes [x]   No []   Unknown Abdominal pain  []   Yes [x]   No []   Unknown Diarrhea (?3 loose/looser than normal stools/24hr period) []   Yes [x]   No []   Unknown Other, specify:  [name] (dauhther) is bringing the pt into the office/ prescreened.

## 2019-02-23 ENCOUNTER — Encounter: Payer: Self-pay | Admitting: Nurse Practitioner

## 2019-02-23 ENCOUNTER — Ambulatory Visit (INDEPENDENT_AMBULATORY_CARE_PROVIDER_SITE_OTHER): Payer: Medicare Other | Admitting: Nurse Practitioner

## 2019-02-23 VITALS — BP 118/78 | HR 91 | Temp 97.1°F | Ht 64.61 in | Wt 202.4 lb

## 2019-02-23 DIAGNOSIS — E782 Mixed hyperlipidemia: Secondary | ICD-10-CM | POA: Diagnosis not present

## 2019-02-23 DIAGNOSIS — G309 Alzheimer's disease, unspecified: Secondary | ICD-10-CM

## 2019-02-23 DIAGNOSIS — F329 Major depressive disorder, single episode, unspecified: Secondary | ICD-10-CM | POA: Diagnosis not present

## 2019-02-23 DIAGNOSIS — F0393 Unspecified dementia, unspecified severity, with mood disturbance: Secondary | ICD-10-CM

## 2019-02-23 DIAGNOSIS — Z23 Encounter for immunization: Secondary | ICD-10-CM | POA: Diagnosis not present

## 2019-02-23 DIAGNOSIS — I10 Essential (primary) hypertension: Secondary | ICD-10-CM | POA: Diagnosis not present

## 2019-02-23 DIAGNOSIS — F0280 Dementia in other diseases classified elsewhere without behavioral disturbance: Secondary | ICD-10-CM

## 2019-02-23 DIAGNOSIS — M8588 Other specified disorders of bone density and structure, other site: Secondary | ICD-10-CM

## 2019-02-23 DIAGNOSIS — R739 Hyperglycemia, unspecified: Secondary | ICD-10-CM | POA: Insufficient documentation

## 2019-02-23 DIAGNOSIS — F0150 Vascular dementia without behavioral disturbance: Secondary | ICD-10-CM

## 2019-02-23 NOTE — Patient Instructions (Addendum)
Go to lab for blood draw. Will send medication refill after lab result review.   Please make an appointment at the scheduling desk with the [facility], for your Wellness visit in this office, which is a benefit with your insurance. This is recommended once a year.

## 2019-02-23 NOTE — Progress Notes (Signed)
Subjective:  Patient ID: Carol Carol [name], female    DOB: 1936/03/09  Age: 83 y.o. MRN: [id number]  CC: Follow-up (follow up HTN and Dementia/ meds refills/flu shot)  HPI Accompanied by daughter -Carol Carol [name] denies any acute complains or change in mental status.  HTN: Stable with amlodipine 2.5 BP Readings from Last 3 Encounters:  02/23/19 118/78  11/16/18 (!) 135/94  09/27/18 128/89   Dementia: Stable mood with aricept, seroquel and exelon patch. Normal appetite, normal BM and GU function, no daytime somnolence, no falls, no hallucinations MMSE - Mini Mental State Exam 02/23/2019 11/11/2017 10/29/2016  Orientation to time 1 0 0  Orientation to Place 4 3 3   Registration 3 3 3   Attention/ Calculation 5 5 5   Recall 0 0 0  Language- name 2 objects 2 2 2   Language- repeat 1 1 1   Language- follow 3 step command 3 3 3   Language- read & follow direction 1 1 1   Write a sentence 1 1 1   Copy design 1 1 0  Total score 22 20 19    Reviewed past Medical, Social and Family history today.  Outpatient Medications Prior to Visit  Medication Sig Dispense Refill  . acetaminophen (TYLENOL) 325 MG tablet Take 325 mg by mouth daily as needed (pain).    . Calcium Carb-Cholecalciferol (CALCIUM 600 + D PO) Take by mouth. BID--otc    . Cholecalciferol (VITAMIN D3 PO) Take 1,000 Units by mouth. Daily-otc    . Multiple Vitamins-Minerals (MULTIVITAMIN WITH MINERALS) tablet Take 1 tablet by mouth daily with supper. Centrum Silver    . nystatin (MYCOSTATIN/NYSTOP) powder Apply topically 2 (two) times daily. To groin and abdomen area 45 g 1  . polyethylene glycol (MIRALAX / GLYCOLAX) packet Take 17 g by mouth daily. 14 each 0  . Probiotic Product (PROBIOTIC DAILY PO) Take by mouth. Culturelle--otc    . alendronate (FOSAMAX) 70 MG tablet Take 1 tablet (70 mg total) by mouth once a week. Take with a full glass of water on an empty stomach. 12 tablet 1  . amLODipine (NORVASC) 2.5 MG tablet Take 1 tablet  (2.5 mg total) by mouth daily. 90 tablet 1  . atorvastatin (LIPITOR) 20 MG tablet TAKE 1 TABLET BY MOUTH EVERY DAY FOR CHOLESTEROL 90 tablet 1  . Memantine HCl-Donepezil HCl (NAMZARIC) 28-10 MG CP24 Take 1 capsule by mouth daily. 90 capsule 1  . QUEtiapine (SEROQUEL) 100 MG tablet TAKE 1 TABLET BY MOUTH EVERY MORNING AND 1 TABLET AT BEDTIME 180 tablet 1  . rivastigmine (EXELON) 13.3 MG/24HR Place 1 patch (13.3 mg total) onto the skin daily. 90 patch 1   No facility-administered medications prior to visit.     ROS See HPI  Objective:  BP 118/78   Pulse 91   Temp (!) 97.1 F (36.2 C) (Tympanic)   Ht 5' 4.61" (1.641 Carol [name])   Wt 202 lb 6.4 oz (91.8 kg)   SpO2 96%   BMI 34.09 kg/Carol [name]   BP Readings from Last 3 Encounters:  02/23/19 118/78  11/16/18 (!) 135/94  09/27/18 128/89   Wt Readings from Last 3 Encounters:  02/23/19 202 lb 6.4 oz (91.8 kg)  11/16/18 193 lb (87.5 kg)  09/27/18 192 lb (87.1 kg)   Physical Exam Vitals signs reviewed.  Eyes:     Extraocular Movements: Extraocular movements intact.     Conjunctiva/sclera: Conjunctivae normal.  Cardiovascular:     Rate and Rhythm: Normal rate and regular rhythm.     Pulses: Normal  pulses.     Heart sounds: Murmur present.  Pulmonary:     Effort: Pulmonary effort is normal.     Breath sounds: Normal breath sounds.  Musculoskeletal:     Right lower leg: No edema.     Left lower leg: No edema.  Neurological:     Mental Status: She is alert.  Psychiatric:        Mood and Affect: Mood normal.        Behavior: Behavior normal.    Lab Results  Component Value Date   WBC 11.0 (H) 07/09/2018   HGB 13.0 07/09/2018   HCT 43.1 07/09/2018   PLT 229 07/09/2018   GLUCOSE 123 (H) 07/09/2018   CHOL 174 09/14/2017   TRIG 45 09/14/2017   HDL 67 09/14/2017   LDLCALC 94 09/14/2017   ALT 12 07/09/2018   AST 27 07/09/2018   NA 142 07/09/2018   K 4.4 07/09/2018   CL 108 07/09/2018   CREATININE 1.29 (H) 07/09/2018   BUN 20  07/09/2018   CO2 22 07/09/2018   TSH 1.56 09/14/2017   INR 1.10 09/19/2009   HGBA1C 6.1 (H) 11/11/2017    Dg Chest 2 View  Result Date: 07/09/2018 CLINICAL DATA:  Syncope x2 today. EXAM: CHEST - 2 VIEW COMPARISON:  PA and lateral chest 01/02/2018 in 09/16/2009. CT chest 09/16/2009. FINDINGS: Lung volumes are low with basilar atelectasis. There is cardiomegaly. Tortuous aorta with atherosclerotic vascular disease noted. No acute or focal bony abnormality. IMPRESSION: No acute disease. Cardiomegaly. Atherosclerosis. Electronically Signed   By: Drusilla Kannerhomas  Dalessio Carol [name].D.   On: 07/09/2018 18:49    Assessment & Plan:   Carol JonesCarolyn was seen today for follow-up.  Diagnoses and all orders for this visit:  Essential hypertension, benign -     Cancel: Basic metabolic panel -     Cancel: TSH -     Basic metabolic panel; Future -     Hemoglobin A1c; Future -     Hepatic function panel; Future -     TSH; Future -     amLODipine (NORVASC) 2.5 MG tablet; Take 1 tablet (2.5 mg total) by mouth daily.  Depression due to dementia (HCC) -     Cancel: TSH  Mixed hyperlipidemia -     Cancel: Hepatic function panel -     Basic metabolic panel; Future -     Hemoglobin A1c; Future -     Hepatic function panel; Future -     TSH; Future -     atorvastatin (LIPITOR) 20 MG tablet; TAKE 1 TABLET BY MOUTH EVERY DAY FOR CHOLESTEROL  Mixed Alzheimer's and vascular dementia (HCC) -     Basic metabolic panel; Future -     Hemoglobin A1c; Future -     Hepatic function panel; Future -     TSH; Future -     Memantine HCl-Donepezil HCl (NAMZARIC) 28-10 MG CP24; Take 1 capsule by mouth daily. -     QUEtiapine (SEROQUEL) 100 MG tablet; TAKE 1 TABLET BY MOUTH EVERY MORNING AND 1 TABLET AT BEDTIME -     rivastigmine (EXELON) 13.3 MG/24HR; Place 1 patch (13.3 mg total) onto the skin daily.  Hyperglycemia -     Cancel: Hemoglobin A1c -     Basic metabolic panel; Future -     Hemoglobin A1c; Future -     Hepatic  function panel; Future -     TSH; Future  Need for influenza vaccination -  Flu Vaccine QUAD High Dose(Fluad)  Osteopenia of lumbar spine -     alendronate (FOSAMAX) 70 MG tablet; Take 1 tablet (70 mg total) by mouth once a week. Take with a full glass of water on an empty stomach.   I am having Carol Clevelandarolyn Carol [name]. Clinton SawyerWilliamson maintain her multivitamin with minerals, acetaminophen, nystatin, Cholecalciferol (VITAMIN D3 PO), Calcium Carb-Cholecalciferol (CALCIUM 600 + D PO), Probiotic Product (PROBIOTIC DAILY PO), polyethylene glycol, atorvastatin, amLODipine, alendronate, Namzaric, QUEtiapine, and rivastigmine.  Meds ordered this encounter  Medications  . atorvastatin (LIPITOR) 20 MG tablet    Sig: TAKE 1 TABLET BY MOUTH EVERY DAY FOR CHOLESTEROL    Dispense:  90 tablet    Refill:  1    Order Specific Question:   Supervising Provider    Answer:   Carol Carol [name], Carol Carol [name] [[id number]]  . amLODipine (NORVASC) 2.5 MG tablet    Sig: Take 1 tablet (2.5 mg total) by mouth daily.    Dispense:  90 tablet    Refill:  1    Order Specific Question:   Supervising Provider    Answer:   Carol Carol [name], Carol Carol [name] [[id number]]  . alendronate (FOSAMAX) 70 MG tablet    Sig: Take 1 tablet (70 mg total) by mouth once a week. Take with a full glass of water on an empty stomach.    Dispense:  12 tablet    Refill:  1    Order Specific Question:   Supervising Provider    Answer:   Carol Carol [name], Carol Carol [name] [[id number]]  . Memantine HCl-Donepezil HCl (NAMZARIC) 28-10 MG CP24    Sig: Take 1 capsule by mouth daily.    Dispense:  90 capsule    Refill:  1    Order Specific Question:   Supervising Provider    Answer:   Carol Carol [name], Carol Carol [name] [[id number]]  . QUEtiapine (SEROQUEL) 100 MG tablet    Sig: TAKE 1 TABLET BY MOUTH EVERY MORNING AND 1 TABLET AT BEDTIME    Dispense:  180 tablet    Refill:  1    Order Specific Question:   Supervising Provider    Answer:   Carol Carol [name], Carol Carol [name] [[id number]]  . rivastigmine (EXELON) 13.3 MG/24HR    Sig: Place 1 patch (13.3 mg total) onto the skin  daily.    Dispense:  90 patch    Refill:  1    Order Specific Question:   Supervising Provider    Answer:   Carol Carol [name], Carol Carol [name] [[id number]]    Problem List Items Addressed This Visit      Cardiovascular and Mediastinum   Essential hypertension, benign - Primary   Relevant Medications   atorvastatin (LIPITOR) 20 MG tablet   amLODipine (NORVASC) 2.5 MG tablet   Other Relevant Orders   Basic metabolic panel   Hemoglobin A1c   Hepatic function panel   TSH   Mixed Alzheimer's and vascular dementia (HCC)   Relevant Medications   atorvastatin (LIPITOR) 20 MG tablet   amLODipine (NORVASC) 2.5 MG tablet   Memantine HCl-Donepezil HCl (NAMZARIC) 28-10 MG CP24   QUEtiapine (SEROQUEL) 100 MG tablet   rivastigmine (EXELON) 13.3 MG/24HR   Other Relevant Orders   Basic metabolic panel   Hemoglobin A1c   Hepatic function panel   TSH     Nervous and Auditory   Depression due to dementia (HCC)   Relevant Medications   Memantine HCl-Donepezil HCl (NAMZARIC) 28-10 MG CP24   QUEtiapine (SEROQUEL) 100 MG tablet   rivastigmine (EXELON) 13.3 MG/24HR  Musculoskeletal and Integument   Osteopenia   Relevant Medications   alendronate (FOSAMAX) 70 MG tablet     Other   Hyperglycemia   Relevant Orders   Basic metabolic panel   Hemoglobin A1c   Hepatic function panel   TSH   Hyperlipidemia   Relevant Medications   atorvastatin (LIPITOR) 20 MG tablet   amLODipine (NORVASC) 2.5 MG tablet   Other Relevant Orders   Basic metabolic panel   Hemoglobin A1c   Hepatic function panel   TSH    Other Visit Diagnoses    Need for influenza vaccination       Relevant Orders   Flu Vaccine QUAD High Dose(Fluad) (Completed)      Follow-up: Return in about 6 months (around 08/23/2019) for HTN and , hyperlipidemia.  Wilfred Lacy, NP

## 2019-02-24 MED ORDER — ALENDRONATE SODIUM 70 MG PO TABS: 70.0000 mg | ORAL_TABLET | ORAL | 1 refills | Status: DC

## 2019-02-24 MED ORDER — NAMZARIC 28-10 MG PO CP24: 1.0000 | ORAL_CAPSULE | Freq: Every day | ORAL | 1 refills | Status: DC

## 2019-02-24 MED ORDER — AMLODIPINE BESYLATE 2.5 MG PO TABS: 2.5000 mg | ORAL_TABLET | Freq: Every day | ORAL | 1 refills | Status: DC

## 2019-02-24 MED ORDER — RIVASTIGMINE 13.3 MG/24HR TD PT24: 13.3000 mg | MEDICATED_PATCH | Freq: Every day | TRANSDERMAL | 1 refills | Status: DC

## 2019-02-24 MED ORDER — ATORVASTATIN CALCIUM 20 MG PO TABS: ORAL_TABLET | ORAL | 1 refills | Status: DC

## 2019-02-24 MED ORDER — QUETIAPINE FUMARATE 100 MG PO TABS: ORAL_TABLET | ORAL | 1 refills | Status: DC

## 2019-03-01 ENCOUNTER — Telehealth: Payer: Self-pay

## 2019-03-01 NOTE — Telephone Encounter (Signed)
During this illness, did/does the patient experience any of the following symptoms? Fever >100.68F []   Yes [x]   No []   Unknown Subjective fever (felt feverish) []   Yes [x]   No []   Unknown Chills []   Yes [x]   No []   Unknown Muscle aches (myalgia) []   Yes [x]   No []   Unknown Runny nose (rhinorrhea) []   Yes [x]   No []   Unknown Sore throat []   Yes [x]   No []   Unknown Cough (new onset or worsening of chronic cough) []   Yes [x]   No []   Unknown Shortness of breath (dyspnea) []   Yes [x]   No []   Unknown Nausea or vomiting []   Yes [x]   No []   Unknown Headache []   Yes [x]   No []   Unknown Abdominal pain  []   Yes [x]   No []   Unknown Diarrhea (?3 loose/looser than normal stools/24hr period) []   Yes [x]   No []   PRESCREENED DAUGHTER, TERESA [name] TOO. SHE ANSWERED NO TO ALL QUESTIONS.

## 2019-03-02 ENCOUNTER — Other Ambulatory Visit (INDEPENDENT_AMBULATORY_CARE_PROVIDER_SITE_OTHER): Payer: Medicare Other

## 2019-03-02 ENCOUNTER — Other Ambulatory Visit: Payer: Self-pay

## 2019-03-02 DIAGNOSIS — I10 Essential (primary) hypertension: Secondary | ICD-10-CM

## 2019-03-02 DIAGNOSIS — R739 Hyperglycemia, unspecified: Secondary | ICD-10-CM

## 2019-03-02 DIAGNOSIS — E782 Mixed hyperlipidemia: Secondary | ICD-10-CM | POA: Diagnosis not present

## 2019-03-02 DIAGNOSIS — F0280 Dementia in other diseases classified elsewhere without behavioral disturbance: Secondary | ICD-10-CM

## 2019-03-02 DIAGNOSIS — F0150 Vascular dementia without behavioral disturbance: Secondary | ICD-10-CM

## 2019-03-02 DIAGNOSIS — G309 Alzheimer's disease, unspecified: Secondary | ICD-10-CM | POA: Diagnosis not present

## 2019-03-02 LAB — BASIC METABOLIC PANEL
BUN: 13 mg/dL (ref 6–23)
CO2: 25 mEq/L (ref 19–32)
Calcium: 10 mg/dL (ref 8.4–10.5)
Chloride: 102 mEq/L (ref 96–112)
Creatinine, Ser: 0.97 mg/dL (ref 0.40–1.20)
GFR: 66.64 mL/min (ref >60.00)
Glucose, Bld: 97 mg/dL (ref 70–99)
Potassium: 3.7 mEq/L (ref 3.5–5.1)
Sodium: 139 mEq/L (ref 135–145)

## 2019-03-02 LAB — HEPATIC FUNCTION PANEL
ALT: 8 U/L (ref 0–35)
AST: 15 U/L (ref 0–37)
Albumin: 3.7 g/dL (ref 3.5–5.2)
Alkaline Phosphatase: 70 U/L (ref 39–117)
Bilirubin, Direct: 0 mg/dL (ref 0.0–0.3)
Total Bilirubin: 0.4 mg/dL (ref 0.2–1.2)
Total Protein: 7.9 g/dL (ref 6.0–8.3)

## 2019-03-02 LAB — TSH: TSH: 2.26 u[IU]/mL (ref 0.35–4.50)

## 2019-03-03 LAB — HEMOGLOBIN A1C: Hgb A1c MFr Bld: 6.5 % (ref 4.6–6.5)

## 2019-03-07 ENCOUNTER — Telehealth: Payer: Self-pay

## 2019-03-07 NOTE — Telephone Encounter (Signed)
Copied from [place] [id number]. Topic: General - Other >> Mar 07, 2019  4:40 PM Rainey Pines A wrote: Patients daughter called to inform Phet BP reading of 143/102 and pulse 91 and 156/100 pulse 94 on other arm. Otila Kluver would like a callback

## 2019-03-07 NOTE — Telephone Encounter (Signed)
Baldo Ash pleas advise, follow up on for the edema

## 2019-03-08 ENCOUNTER — Telehealth: Payer: Self-pay | Admitting: Nurse Practitioner

## 2019-03-08 DIAGNOSIS — R600 Localized edema: Secondary | ICD-10-CM

## 2019-03-08 DIAGNOSIS — E1165 Type 2 diabetes mellitus with hyperglycemia: Secondary | ICD-10-CM

## 2019-03-08 MED ORDER — METFORMIN HCL 500 MG PO TABS: 250.0000 mg | ORAL_TABLET | Freq: Two times a day (BID) | ORAL | 2 refills | Status: DC

## 2019-03-08 MED ORDER — FUROSEMIDE 20 MG PO TABS: 20.0000 mg | ORAL_TABLET | Freq: Every day | ORAL | 0 refills | Status: DC

## 2019-03-08 MED ORDER — POTASSIUM CHLORIDE CRYS ER 20 MEQ PO TBCR: 20.0000 meq | EXTENDED_RELEASE_TABLET | Freq: Every day | ORAL | 3 refills | Status: DC

## 2019-03-08 NOTE — Telephone Encounter (Signed)
See phone note

## 2019-03-08 NOTE — Telephone Encounter (Signed)
Furosemide and potassium x 3days sent for LE edema. Schedule video appt if no improvement with diuretic and leg elevation. Metformin sent for DM. F/up in 29months

## 2019-03-08 NOTE — Telephone Encounter (Signed)
Carol [name] is aware.

## 2019-03-08 NOTE — Telephone Encounter (Signed)
-----   Message from Shawnie Pons, LPN sent at 0/15/8682  4:28 PM EDT ----- Carol [name] is agree with furosemide and metformin. Please send to [facility]. Carol [name] stated she can get BP and Pulse but unable to get the weight.

## 2019-03-14 ENCOUNTER — Telehealth: Payer: Self-pay | Admitting: Nurse Practitioner

## 2019-03-14 NOTE — Telephone Encounter (Signed)
Daughter would like Simpson or Baldo Ash to call her concerning the new diabetes medicine the pt was put on. Daughter Otila Kluver did not elaborate.

## 2019-03-14 NOTE — Telephone Encounter (Signed)
Pt daughter Otila Kluver would like to discuss the side effects of the her mom new medication(metFormin). Daughter was told it could cause renal failure so she just want to discuss more about it.

## 2019-03-14 NOTE — Telephone Encounter (Signed)
Talked to [place] about use of metformin. I explained that metformin was recommended over other diabetic medication due to lower risk of hypoglycemia. Metformin should be taken with food to minimize GI side effects. They are to hold medication if she develops nausea/vomiting/diarrhea or decrease oral intake for whatever reason Repeat BMP in 3-105months is recommended to monitor renal function. Otila Kluver and [place] verbalized understanding  and decided to continue metformin for now.

## 2019-03-18 ENCOUNTER — Telehealth: Payer: Self-pay | Admitting: Nurse Practitioner

## 2019-03-18 DIAGNOSIS — I10 Essential (primary) hypertension: Secondary | ICD-10-CM

## 2019-03-18 DIAGNOSIS — E782 Mixed hyperlipidemia: Secondary | ICD-10-CM

## 2019-03-18 NOTE — Telephone Encounter (Signed)
Patient's daughter, Carol [name], calling and states that her mothers blood pressures have been elevated recently and she would like to know how Carol [name] would like to handle this. States that [name] recently decreased her blood pressure medication to 1/2 tablet a day. Please advise.    03/13/2019   137/95 03/14/2019- 142/105 03/15/2019- 141/103 03/16/2019- 141/92 03/17/2019- 140/92  CB#: [phone]

## 2019-03-18 NOTE — Telephone Encounter (Signed)
Please advise to increase amlodipine to 5mg  daily or 2.5mg  BID Call office with BP readings on Monday.

## 2019-03-18 NOTE — Telephone Encounter (Signed)
Unable to leave vm, need to inform Charlotte's message below.

## 2019-03-21 NOTE — Telephone Encounter (Signed)
Unable to leave vm but call [phone] and left message for tina to call back.

## 2019-03-22 NOTE — Telephone Encounter (Signed)
no

## 2019-03-22 NOTE — Telephone Encounter (Signed)
Carol [name] is aware of message, advise the pt to take it for couple days and give [place] a call with BP reading on Thursday or Friday.   Charlotte please advise, do you want me to tell her anything different.

## 2019-04-01 NOTE — Telephone Encounter (Signed)
Looks like BP has improved. Maintain amlodipine 2.5mg  BID  Also schedule lab appt for repeat BMP and lipid panel (fasting)

## 2019-04-01 NOTE — Addendum Note (Signed)
Addended by: Leana Gamer on: 04/01/2019 03:48 PM   Modules accepted: Orders

## 2019-04-01 NOTE — Telephone Encounter (Signed)
Carol [name] is aware,  When do you want the pt to come back and repeat the lab? Ok to leave detail message for Carol [name] if no answer.

## 2019-04-01 NOTE — Telephone Encounter (Signed)
Carol [name] pt'sdaughter was told to call and give HP readings  10.13.20  150/99 10.14.20   135/94 10.15.20  139/88 10.16.20  149/108 10.17.20  138/92 10.18.20  109/93 10.19.20  110/94 10.20.20  117/91  Please look at and advise daughter what the next step is.

## 2019-04-03 NOTE — Telephone Encounter (Signed)
Within this week

## 2019-04-04 NOTE — Telephone Encounter (Signed)
Please help call and offer an appt for the pt.  

## 2019-04-04 NOTE — Telephone Encounter (Signed)
Done

## 2019-04-05 ENCOUNTER — Other Ambulatory Visit: Payer: Self-pay

## 2019-04-06 ENCOUNTER — Other Ambulatory Visit (INDEPENDENT_AMBULATORY_CARE_PROVIDER_SITE_OTHER): Payer: Medicare Other

## 2019-04-06 DIAGNOSIS — I10 Essential (primary) hypertension: Secondary | ICD-10-CM

## 2019-04-06 DIAGNOSIS — E782 Mixed hyperlipidemia: Secondary | ICD-10-CM | POA: Diagnosis not present

## 2019-04-06 DIAGNOSIS — E1165 Type 2 diabetes mellitus with hyperglycemia: Secondary | ICD-10-CM

## 2019-04-06 LAB — BASIC METABOLIC PANEL
BUN: 13 mg/dL (ref 6–23)
CO2: 28 mEq/L (ref 19–32)
Calcium: 9.4 mg/dL (ref 8.4–10.5)
Chloride: 103 mEq/L (ref 96–112)
Creatinine, Ser: 1.07 mg/dL (ref 0.40–1.20)
GFR: 59.49 mL/min — ABNORMAL LOW (ref >60.00)
Glucose, Bld: 131 mg/dL — ABNORMAL HIGH (ref 70–99)
Potassium: 4 mEq/L (ref 3.5–5.1)
Sodium: 140 mEq/L (ref 135–145)

## 2019-04-06 LAB — LIPID PANEL
Cholesterol: 160 mg/dL (ref 0–200)
HDL: 66.6 mg/dL (ref >39.00)
LDL Cholesterol: 79 mg/dL (ref 0–99)
NonHDL: 93.15
Total CHOL/HDL Ratio: 2
Triglycerides: 73 mg/dL (ref 0.0–149.0)
VLDL: 14.6 mg/dL (ref 0.0–40.0)

## 2019-04-07 ENCOUNTER — Other Ambulatory Visit: Payer: Medicare Other

## 2019-04-07 MED ORDER — METFORMIN HCL 500 MG PO TABS: 250.0000 mg | ORAL_TABLET | Freq: Two times a day (BID) | ORAL | 1 refills | Status: DC

## 2019-04-07 MED ORDER — AMLODIPINE BESYLATE 2.5 MG PO TABS: 2.5000 mg | ORAL_TABLET | Freq: Two times a day (BID) | ORAL | 1 refills | Status: DC

## 2019-04-07 NOTE — Addendum Note (Signed)
Addended by: Leana Gamer on: 04/07/2019 08:41 AM   Modules accepted: Orders

## 2019-06-03 ENCOUNTER — Other Ambulatory Visit: Payer: Self-pay | Admitting: Nurse Practitioner

## 2019-06-03 DIAGNOSIS — E1165 Type 2 diabetes mellitus with hyperglycemia: Secondary | ICD-10-CM

## 2019-06-14 ENCOUNTER — Ambulatory Visit: Payer: Medicare Other | Admitting: Internal Medicine

## 2019-08-23 ENCOUNTER — Telehealth: Payer: Self-pay | Admitting: Nurse Practitioner

## 2019-08-23 NOTE — Telephone Encounter (Signed)
PA approved, pharmacy notified.

## 2019-08-23 NOTE — Telephone Encounter (Signed)
BIN # [id number] PCN # [id number] ID # [id number]

## 2019-08-23 NOTE — Telephone Encounter (Signed)
PA for Quetiapine 100 mg started, waiting for result  Carol [name] (Key: [id number])

## 2019-10-20 ENCOUNTER — Telehealth: Payer: Self-pay

## 2019-10-20 DIAGNOSIS — I10 Essential (primary) hypertension: Secondary | ICD-10-CM

## 2019-10-20 MED ORDER — AMLODIPINE BESYLATE 2.5 MG PO TABS: 2.5000 mg | ORAL_TABLET | Freq: Two times a day (BID) | ORAL | 1 refills | Status: DC

## 2019-10-20 NOTE — Telephone Encounter (Signed)
Charlotte please advise.  Received refill request for:  Amlodipine Besylate 2.5mg   180-qty    0-refills  Last fill-04/07/19 Last Office visit-02/23/19  Pt due f/up 3/16 for HTN and hyperlipidemia  Pt called and was scheduled for 10/26/19 @ 10:30am for that follow-up.

## 2019-10-20 NOTE — Telephone Encounter (Signed)
Ok to send refill  

## 2019-10-20 NOTE — Telephone Encounter (Signed)
Rx sent and pt is aware.  

## 2019-10-26 ENCOUNTER — Other Ambulatory Visit: Payer: Self-pay

## 2019-10-26 ENCOUNTER — Ambulatory Visit (INDEPENDENT_AMBULATORY_CARE_PROVIDER_SITE_OTHER): Payer: Medicare PPO | Admitting: Nurse Practitioner

## 2019-10-26 ENCOUNTER — Encounter: Payer: Self-pay | Admitting: Nurse Practitioner

## 2019-10-26 VITALS — BP 122/72 | HR 82 | Temp 96.5°F | Ht 64.61 in | Wt 204.2 lb

## 2019-10-26 DIAGNOSIS — E782 Mixed hyperlipidemia: Secondary | ICD-10-CM | POA: Diagnosis not present

## 2019-10-26 DIAGNOSIS — R600 Localized edema: Secondary | ICD-10-CM

## 2019-10-26 DIAGNOSIS — G309 Alzheimer's disease, unspecified: Secondary | ICD-10-CM

## 2019-10-26 DIAGNOSIS — I10 Essential (primary) hypertension: Secondary | ICD-10-CM | POA: Diagnosis not present

## 2019-10-26 DIAGNOSIS — R739 Hyperglycemia, unspecified: Secondary | ICD-10-CM

## 2019-10-26 DIAGNOSIS — F0280 Dementia in other diseases classified elsewhere without behavioral disturbance: Secondary | ICD-10-CM

## 2019-10-26 DIAGNOSIS — F0150 Vascular dementia without behavioral disturbance: Secondary | ICD-10-CM

## 2019-10-26 DIAGNOSIS — R350 Frequency of micturition: Secondary | ICD-10-CM

## 2019-10-26 MED ORDER — AMLODIPINE BESYLATE 2.5 MG PO TABS: 2.5000 mg | ORAL_TABLET | Freq: Two times a day (BID) | ORAL | 3 refills | Status: DC

## 2019-10-26 MED ORDER — NAMZARIC 28-10 MG PO CP24: 1.0000 | ORAL_CAPSULE | Freq: Every day | ORAL | 3 refills | Status: DC

## 2019-10-26 MED ORDER — QUETIAPINE FUMARATE 100 MG PO TABS: 100.0000 mg | ORAL_TABLET | Freq: Every day | ORAL | 1 refills | Status: DC

## 2019-10-26 MED ORDER — RIVASTIGMINE 13.3 MG/24HR TD PT24: 13.3000 mg | MEDICATED_PATCH | Freq: Every day | TRANSDERMAL | 3 refills | Status: DC

## 2019-10-26 NOTE — Assessment & Plan Note (Addendum)
Daughter reports no change in mental status. Alert and oriented to person and her daughters only. She reports increase somnolence during the day and refuses to ambulate. Current use of seroquel BID, exelon patch 13.3, and namzaric 28/10mg . No hallucinations, no mood swings, no falls, no wandering.  Decrease seroquel to 100mg  at hs Consider decreasing namzaric dose to 14/10mg  if no improvement in daytime somnolence? Order UA today

## 2019-10-26 NOTE — Patient Instructions (Signed)
Go to lab for blood draw and urine collection.  Decrease seroquel dose to 100mg  at bedtime only  Will send other medication refill after review of lab results

## 2019-10-26 NOTE — Progress Notes (Signed)
Subjective:  Patient ID: Carol [name], female    DOB: 29-May-1936  Age: 84 y.o. MRN: [id number]  CC: Follow-up (hyperlipidemia and HTN-Pt is fasting//BP readings-132/86   123/97   143/110   135/72//had pfizer vaccines but not sure when)  HPI Accompanied by daughter-Teresa HTN: Stable with amlodipine 2.5mg  BID Has LE edema, first noted by home health aide 1week ago Persistent SOB with exertion, no PND CXR 06/2018: cardiomegaly Echo 01/2019: normal EF with diastolic dysfunction Order BNP today. BP Readings from Last 3 Encounters:  10/26/19 122/72  02/23/19 118/78  11/16/18 (!) 135/94   Wt Readings from Last 3 Encounters:  10/26/19 204 lb 3.2 oz (92.6 kg)  02/23/19 202 lb 6.4 oz (91.8 kg)  11/16/18 193 lb (87.5 kg)   Dementia: Daughter reports no change in mental status She reports increase somnolence during the day and refuses to ambulate. Current use of seroquel BID, exelon patch 13.3, and namzaric 28/10mg . No hallucinations, no mood swings, no falls, no wandering.  Reviewed past Medical, Social and Family history today.  Outpatient Medications Prior to Visit  Medication Sig Dispense Refill  . acetaminophen (TYLENOL) 325 MG tablet Take 325 mg by mouth daily as needed (pain).    Marland Kitchen alendronate (FOSAMAX) 70 MG tablet Take 1 tablet (70 mg total) by mouth once a week. Take with a full glass of water on an empty stomach. 12 tablet 1  . atorvastatin (LIPITOR) 20 MG tablet TAKE 1 TABLET BY MOUTH EVERY DAY FOR CHOLESTEROL 90 tablet 1  . Calcium Carb-Cholecalciferol (CALCIUM 600 + D PO) Take by mouth. BID--otc    . Cholecalciferol (VITAMIN D3 PO) Take 1,000 Units by mouth. Daily-otc    . metFORMIN (GLUCOPHAGE) 500 MG tablet Take 0.5 tablets (250 mg total) by mouth 2 (two) times daily with a meal. Need office visit for additional refills 45 tablet 0  . Multiple Vitamins-Minerals (MULTIVITAMIN WITH MINERALS) tablet Take 1 tablet by mouth daily with supper. Centrum Silver    .  nystatin (MYCOSTATIN/NYSTOP) powder Apply topically 2 (two) times daily. To groin and abdomen area 45 g 1  . polyethylene glycol (MIRALAX / GLYCOLAX) packet Take 17 g by mouth daily. 14 each 0  . potassium chloride SA (K-DUR) 20 MEQ tablet Take 1 tablet (20 mEq total) by mouth daily. 3 tablet 3  . Probiotic Product (PROBIOTIC DAILY PO) Take by mouth. Culturelle--otc    . amLODipine (NORVASC) 2.5 MG tablet Take 1 tablet (2.5 mg total) by mouth 2 (two) times daily. 180 tablet 1  . Memantine HCl-Donepezil HCl (NAMZARIC) 28-10 MG CP24 Take 1 capsule by mouth daily. 90 capsule 1  . QUEtiapine (SEROQUEL) 100 MG tablet TAKE 1 TABLET BY MOUTH EVERY MORNING AND 1 TABLET AT BEDTIME 180 tablet 1  . rivastigmine (EXELON) 13.3 MG/24HR Place 1 patch (13.3 mg total) onto the skin daily. 90 patch 1   No facility-administered medications prior to visit.    ROS See HPI  Objective:  BP 122/72   Pulse 82   Temp (!) 96.5 F (35.8 C) (Tympanic)   Ht 5' 4.61" (1.641 m)   Wt 204 lb 3.2 oz (92.6 kg)   SpO2 96%   BMI 34.39 kg/m   BP Readings from Last 3 Encounters:  10/26/19 122/72  02/23/19 118/78  11/16/18 (!) 135/94   Wt Readings from Last 3 Encounters:  10/26/19 204 lb 3.2 oz (92.6 kg)  02/23/19 202 lb 6.4 oz (91.8 kg)  11/16/18 193 lb (87.5 kg)  Physical Exam Cardiovascular:     Rate and Rhythm: Normal rate and regular rhythm.     Pulses: Normal pulses.     Heart sounds: Normal heart sounds.  Pulmonary:     Effort: Pulmonary effort is normal.     Breath sounds: Normal breath sounds.  Musculoskeletal:     Cervical back: Normal range of motion.     Right lower leg: Edema present.     Left lower leg: Edema present.     Comments: Ambulate with cane Slow to get out of chair without assistance  Neurological:     Comments: Oriented to person and family only. Follows simple instructions  Psychiatric:        Attention and Perception: Attention normal.        Mood and Affect: Affect is flat.         Speech: Speech normal.        Behavior: Behavior is cooperative.     Lab Results  Component Value Date   WBC 11.0 (H) 07/09/2018   HGB 13.0 07/09/2018   HCT 43.1 07/09/2018   PLT 229 07/09/2018   GLUCOSE 131 (H) 04/06/2019   CHOL 160 04/06/2019   TRIG 73.0 04/06/2019   HDL 66.60 04/06/2019   LDLCALC 79 04/06/2019   ALT 8 03/02/2019   AST 15 03/02/2019   NA 140 04/06/2019   K 4.0 04/06/2019   CL 103 04/06/2019   CREATININE 1.07 04/06/2019   BUN 13 04/06/2019   CO2 28 04/06/2019   TSH 2.26 03/02/2019   INR 1.10 09/19/2009   HGBA1C 6.5 03/02/2019    Assessment & Plan:  This visit occurred during the SARS-CoV-2 public health emergency.  Safety protocols were in place, including screening questions prior to the visit, additional usage of staff PPE, and extensive cleaning of exam room while observing appropriate contact time as indicated for disinfecting solutions.   Lachanda was seen today for follow-up.  Diagnoses and all orders for this visit:  Essential hypertension, benign -     Basic metabolic panel -     amLODipine (NORVASC) 2.5 MG tablet; Take 1 tablet (2.5 mg total) by mouth 2 (two) times daily.  Mixed hyperlipidemia -     Lipid panel -     CK  Hyperglycemia -     Hemoglobin A1c  Mixed Alzheimer's and vascular dementia (HCC) -     QUEtiapine (SEROQUEL) 100 MG tablet; Take 1 tablet (100 mg total) by mouth at bedtime. -     Memantine HCl-Donepezil HCl (NAMZARIC) 28-10 MG CP24; Take 1 capsule by mouth daily. -     rivastigmine (EXELON) 13.3 MG/24HR; Place 1 patch (13.3 mg total) onto the skin daily.  Urinary frequency -     Urinalysis w microscopic + reflex cultur  Bilateral leg edema -     B Nat Peptide   I have changed Kelli Hope. Borawski's QUEtiapine. I am also having her maintain her multivitamin with minerals, acetaminophen, nystatin, Cholecalciferol (VITAMIN D3 PO), Calcium Carb-Cholecalciferol (CALCIUM 600 + D PO), Probiotic Product  (PROBIOTIC DAILY PO), polyethylene glycol, atorvastatin, alendronate, potassium chloride SA, metFORMIN, amLODipine, Namzaric, and rivastigmine.  Meds ordered this encounter  Medications  . QUEtiapine (SEROQUEL) 100 MG tablet    Sig: Take 1 tablet (100 mg total) by mouth at bedtime.    Dispense:  180 tablet    Refill:  1    Change in dose    Order Specific Question:   Supervising Provider    Answer:  Luana Shu K [[id number]]  . amLODipine (NORVASC) 2.5 MG tablet    Sig: Take 1 tablet (2.5 mg total) by mouth 2 (two) times daily.    Dispense:  180 tablet    Refill:  3    Order Specific Question:   Supervising Provider    Answer:   Overton Mam [[id number]]  . Memantine HCl-Donepezil HCl (NAMZARIC) 28-10 MG CP24    Sig: Take 1 capsule by mouth daily.    Dispense:  90 capsule    Refill:  3    Order Specific Question:   Supervising Provider    Answer:   Overton Mam [[id number]]  . rivastigmine (EXELON) 13.3 MG/24HR    Sig: Place 1 patch (13.3 mg total) onto the skin daily.    Dispense:  90 patch    Refill:  3    Order Specific Question:   Supervising Provider    Answer:   Overton Mam [[id number]]    Problem List Items Addressed This Visit      Cardiovascular and Mediastinum   Essential hypertension, benign - Primary   Relevant Medications   amLODipine (NORVASC) 2.5 MG tablet   Other Relevant Orders   Basic metabolic panel   Mixed Alzheimer's and vascular dementia (HCC)   Relevant Medications   QUEtiapine (SEROQUEL) 100 MG tablet   amLODipine (NORVASC) 2.5 MG tablet   Memantine HCl-Donepezil HCl (NAMZARIC) 28-10 MG CP24   rivastigmine (EXELON) 13.3 MG/24HR     Other   Hyperglycemia   Relevant Orders   Hemoglobin A1c   Hyperlipidemia   Relevant Medications   amLODipine (NORVASC) 2.5 MG tablet   Other Relevant Orders   Lipid panel   CK    Other Visit Diagnoses    Urinary frequency       Relevant Orders   Urinalysis w microscopic + reflex cultur    Bilateral leg edema       Relevant Orders   B Nat Peptide      Follow-up: Return in about 6 months (around 04/27/2020) for DM and HTN, Dementia.  Alysia Penna, NP

## 2019-11-05 ENCOUNTER — Other Ambulatory Visit: Payer: Self-pay | Admitting: Nurse Practitioner

## 2019-11-05 DIAGNOSIS — M8588 Other specified disorders of bone density and structure, other site: Secondary | ICD-10-CM

## 2019-11-11 IMAGING — DX DG ABDOMEN 1V
2 series · 2 of 2 positions shown · non-contrast
Comparison: None.

CLINICAL DATA: Constipation and diarrhea

EXAM:
ABDOMEN - 1 VIEW

[abdomen supine ap (1 of 2)]
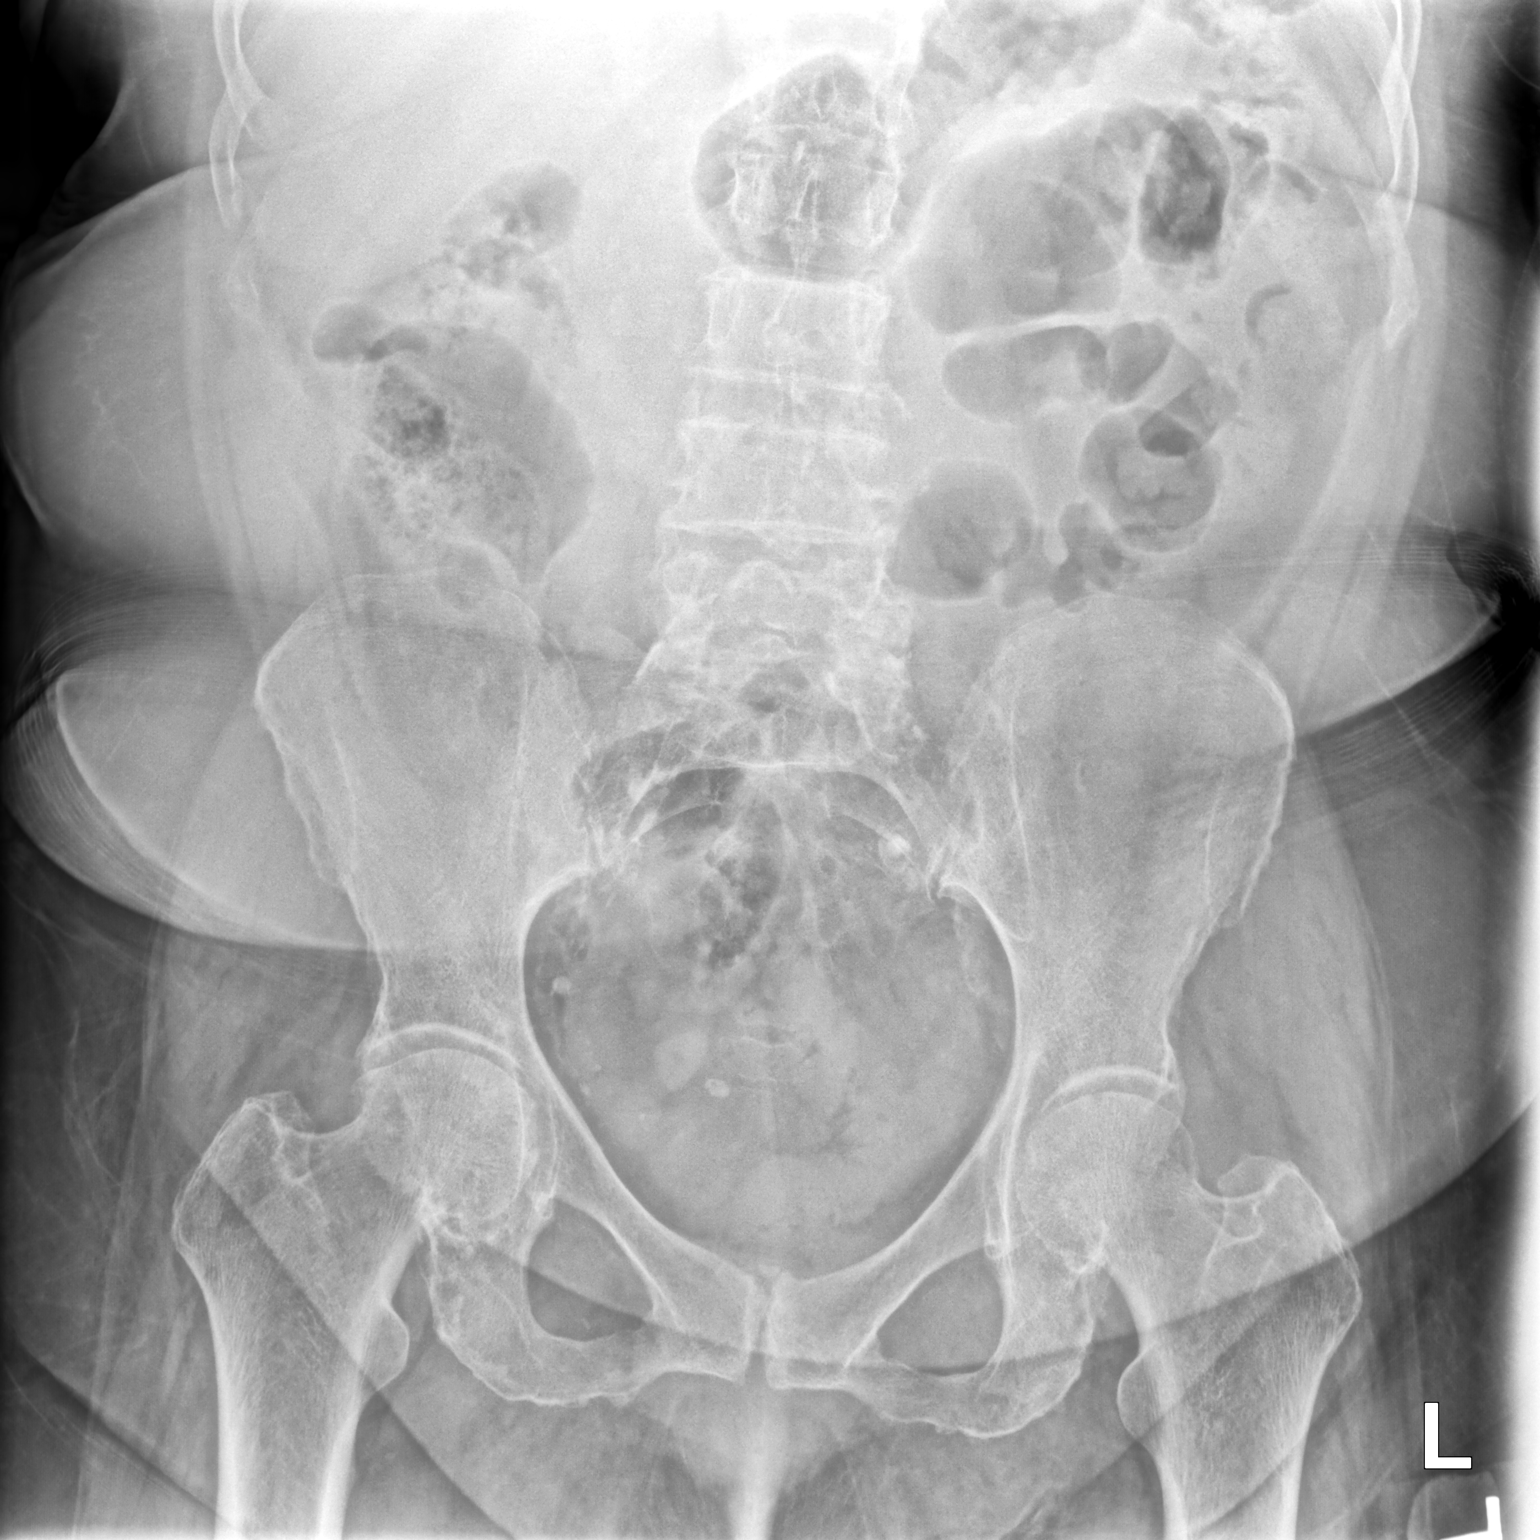

[abdomen supine ap (2 of 2)]
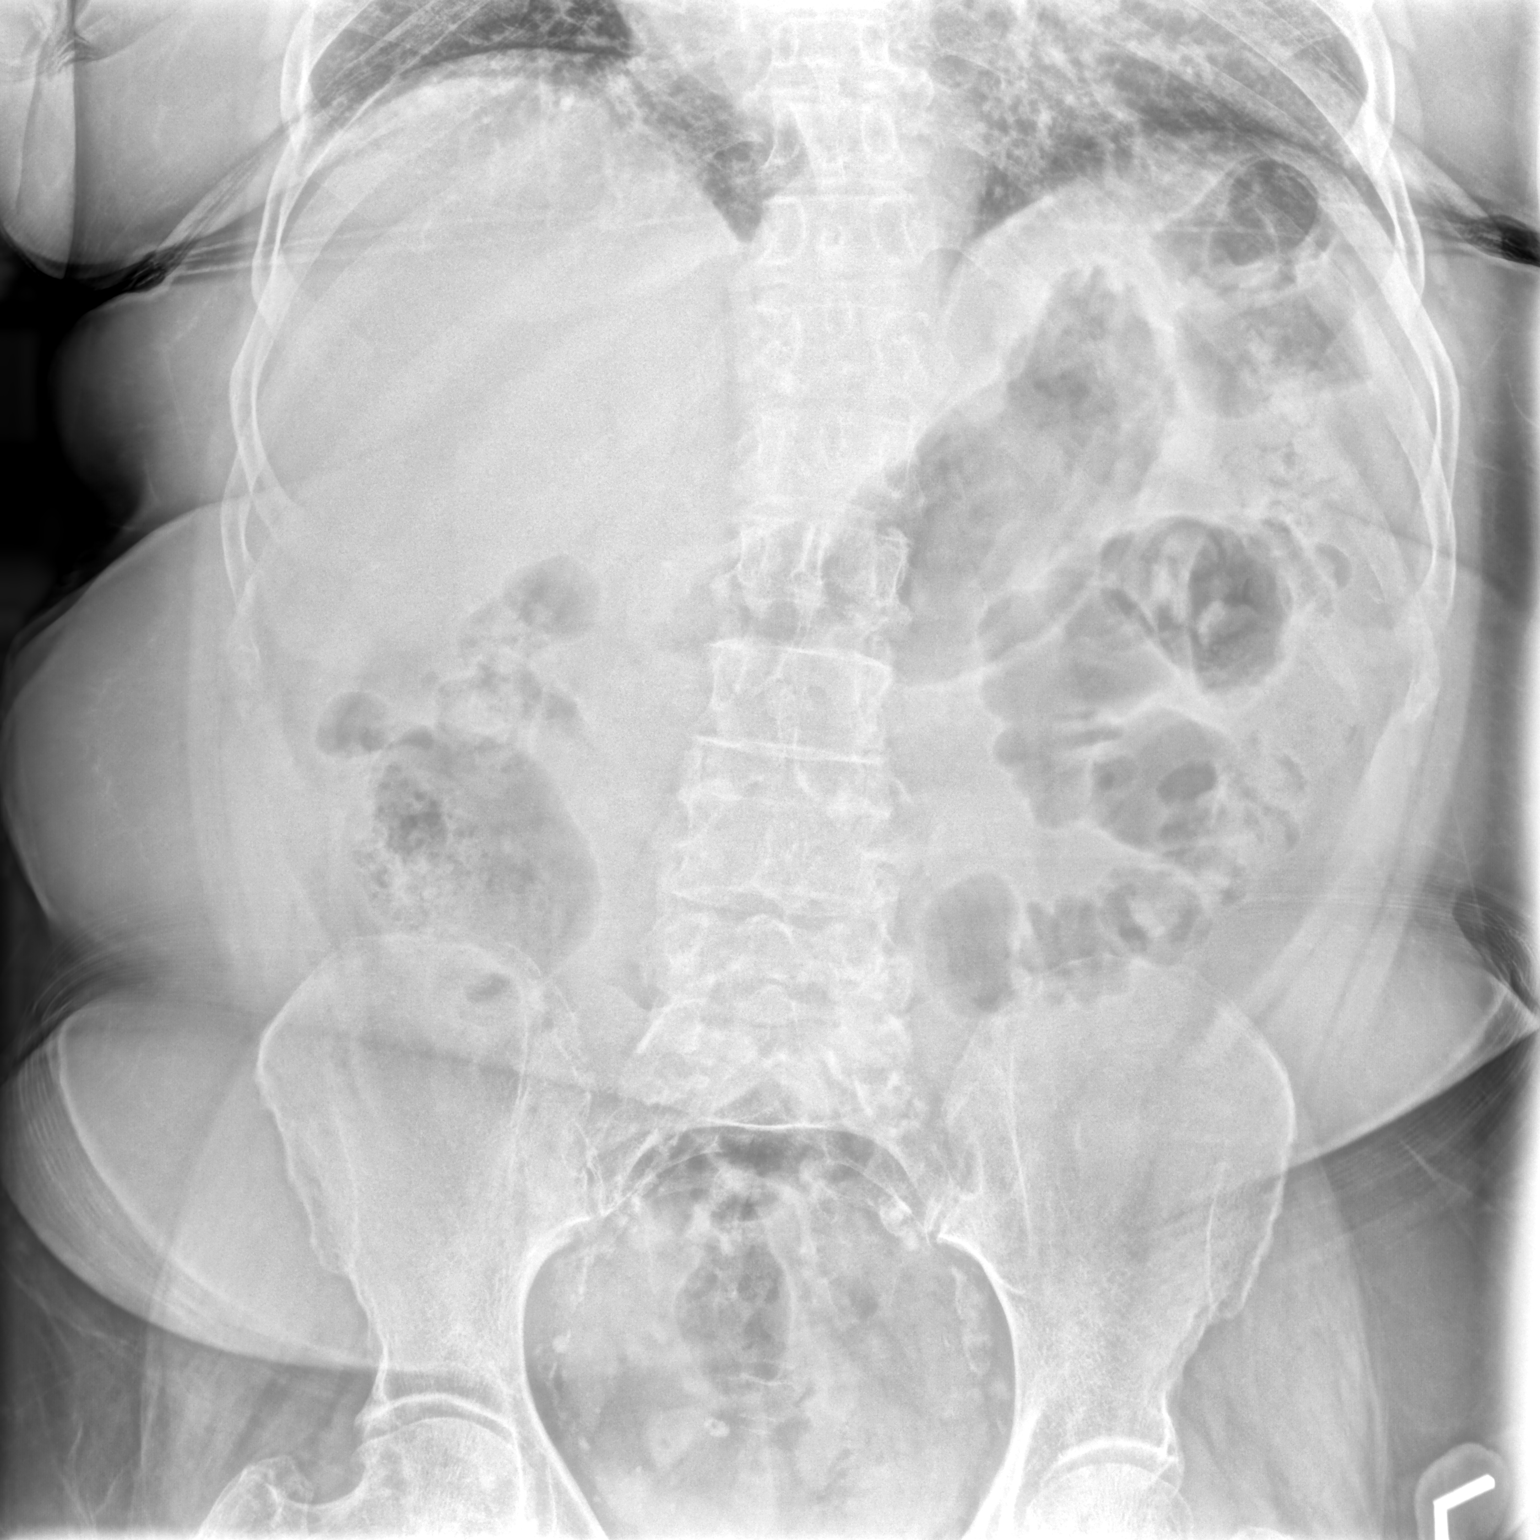

[2 of 2 positions shown; findings below may reference images not displayed]

FINDINGS: Nonobstructed gas pattern with moderate stool. Calcified phleboliths
in the pelvis. Vascular calcifications.
IMPRESSION: Nonobstructed bowel gas pattern with moderate stool.

## 2019-11-13 ENCOUNTER — Other Ambulatory Visit: Payer: Self-pay | Admitting: Nurse Practitioner

## 2019-11-13 DIAGNOSIS — E782 Mixed hyperlipidemia: Secondary | ICD-10-CM

## 2019-12-23 ENCOUNTER — Telehealth: Payer: Self-pay | Admitting: Nurse Practitioner

## 2019-12-23 DIAGNOSIS — F0150 Vascular dementia without behavioral disturbance: Secondary | ICD-10-CM

## 2019-12-23 NOTE — Telephone Encounter (Signed)
Patient daughter is calling and wanted to speak to someone regarding medication Quetiapine, please advise. CB is [phone].

## 2019-12-26 NOTE — Telephone Encounter (Signed)
Padonda Please advise.  Pt's daughter called and said that on 10/26/19 Claris Gower changed her Quetiapine (Seroquel) to once a day at bedtime, she said this did not work for the pt and she was extremely aggravated and so they had to go back to 2 a day one in the AM and one in the PM.  Pt states that her Rx will run out beforehand.  Quetiapine(Seroquel) 180 tabs    1-refill Last fill 10/26/19 Last OV-10/26/19  Pt said she will not run out before Charlottes return but wanted me to send to another doc to make sure she told us the change in meds.

## 2019-12-27 ENCOUNTER — Telehealth: Payer: Self-pay | Admitting: Nurse Practitioner

## 2019-12-27 NOTE — Telephone Encounter (Signed)
error 

## 2019-12-28 NOTE — Telephone Encounter (Signed)
Charlotte please advise.  Pt's daughter called stating that on 10/26/19 her mother's Seroquel was changed to once a day at bedtime, she said this didn't work and that the pt is extremely aggravated and not resting well so they had to go back to taking 2 a day one in the morning hours and one in the PM. Due to this they will run out of this before her refill is due.  Quetiapine(seroquel) 180 tabs   1-refill Last filled-10/26/19 Last OV-10/26/19

## 2019-12-29 MED ORDER — QUETIAPINE FUMARATE 100 MG PO TABS: 100.0000 mg | ORAL_TABLET | Freq: Two times a day (BID) | ORAL | 1 refills | Status: DC

## 2019-12-29 NOTE — Telephone Encounter (Signed)
Sent seroquel with change in dose to 100mg  BID. Remind to schedule lab appt (fasting)

## 2019-12-29 NOTE — Telephone Encounter (Signed)
Patient's daughter, Cameron Proud, is calling again regarding the Seroquel. States the patient had "an episode" yesterday and today.

## 2019-12-29 NOTE — Telephone Encounter (Signed)
Carol [name] please advise.  Pt daughter Carol [name] called again regarding Seroquel. Stating she is having "episodes" an needing this.  Pt has been taking 2 a day one in the morning and one at night. Pt said they tried the 1 a day at bedtime like the Rx said but it didn't help her mother so they went back to the 2 a day. The Rx is written for 1 a day and now they are out of this medication. They have a refill but the pharmacy said they can't pick up now till 02/01/2020.

## 2019-12-30 NOTE — Telephone Encounter (Signed)
Pt was notified and verbally understood an picked up Rx.  Pt's daughters' is going to see when they can bring her in for a fasting lab and call and schedule this.

## 2020-03-08 ENCOUNTER — Other Ambulatory Visit: Payer: Self-pay | Admitting: Nurse Practitioner

## 2020-03-08 DIAGNOSIS — E1165 Type 2 diabetes mellitus with hyperglycemia: Secondary | ICD-10-CM

## 2020-04-24 ENCOUNTER — Other Ambulatory Visit: Payer: Self-pay

## 2020-04-25 ENCOUNTER — Ambulatory Visit (INDEPENDENT_AMBULATORY_CARE_PROVIDER_SITE_OTHER): Payer: Medicare PPO | Admitting: Nurse Practitioner

## 2020-04-25 ENCOUNTER — Encounter: Payer: Self-pay | Admitting: Nurse Practitioner

## 2020-04-25 VITALS — BP 138/82 | HR 102 | Temp 97.0°F | Ht 64.0 in | Wt 200.8 lb

## 2020-04-25 DIAGNOSIS — G309 Alzheimer's disease, unspecified: Secondary | ICD-10-CM

## 2020-04-25 DIAGNOSIS — Z23 Encounter for immunization: Secondary | ICD-10-CM

## 2020-04-25 DIAGNOSIS — M8588 Other specified disorders of bone density and structure, other site: Secondary | ICD-10-CM

## 2020-04-25 DIAGNOSIS — M25472 Effusion, left ankle: Secondary | ICD-10-CM

## 2020-04-25 DIAGNOSIS — I10 Essential (primary) hypertension: Secondary | ICD-10-CM | POA: Diagnosis not present

## 2020-04-25 DIAGNOSIS — M25471 Effusion, right ankle: Secondary | ICD-10-CM | POA: Diagnosis not present

## 2020-04-25 DIAGNOSIS — E1165 Type 2 diabetes mellitus with hyperglycemia: Secondary | ICD-10-CM

## 2020-04-25 DIAGNOSIS — E782 Mixed hyperlipidemia: Secondary | ICD-10-CM

## 2020-04-25 DIAGNOSIS — F0280 Dementia in other diseases classified elsewhere without behavioral disturbance: Secondary | ICD-10-CM

## 2020-04-25 DIAGNOSIS — F0150 Vascular dementia without behavioral disturbance: Secondary | ICD-10-CM

## 2020-04-25 MED ORDER — RIVASTIGMINE 13.3 MG/24HR TD PT24: 13.3000 mg | MEDICATED_PATCH | Freq: Every day | TRANSDERMAL | 3 refills | Status: DC

## 2020-04-25 MED ORDER — ATORVASTATIN CALCIUM 20 MG PO TABS: 20.0000 mg | ORAL_TABLET | Freq: Every day | ORAL | 3 refills | Status: DC

## 2020-04-25 MED ORDER — QUETIAPINE FUMARATE 100 MG PO TABS: 100.0000 mg | ORAL_TABLET | Freq: Two times a day (BID) | ORAL | 3 refills | Status: DC

## 2020-04-25 MED ORDER — AMLODIPINE BESYLATE 2.5 MG PO TABS: 2.5000 mg | ORAL_TABLET | Freq: Two times a day (BID) | ORAL | 3 refills | Status: DC

## 2020-04-25 MED ORDER — ALENDRONATE SODIUM 70 MG PO TABS: ORAL_TABLET | ORAL | 3 refills | Status: DC

## 2020-04-25 NOTE — Progress Notes (Signed)
Subjective:  Patient ID: Carol [name], female    DOB: Sep 30, 1935  Age: 84 y.o. MRN: [id number]  CC: Follow-up (6 month f/u, HTN, dementia, edema. Pt sister and daughter is concerned with her swelling in legs and ankles. She also has been having issues eating. Rash under the breast and abdomen.)  HPI Accompanied by daugther. Persistent LE edema. No cough or pnd OR cp Essential hypertension, benign BP at goal with amlodipine 2.5mg  BID Persistent ankle edema possible due to amlodipine Normal BNP and clear lungs Echocardiogram complete 01/2019: EF of 55-60%. The cavity size was normal. Left ventricular diastolic  with impaired relaxation. No evidence of left ventricular regional wall. Mild sclerosis of the aortic valve.  Aortic valve regurgitation is mild. Mild aortic annular calcification noted.There is mild dilatation of the ascending aorta measuring 39 mm. BP Readings from Last 3 Encounters:  04/25/20 138/82  10/26/19 122/72  02/23/19 118/78   Change amlodipine to losartan?  Type 2 diabetes mellitus with hyperglycemia, without long-term current use of insulin (HCC) Stable HgbA1c at 6.3 Maintain metformin 250mg  daily sob WITH EXERTION (NOT NEW)  Reviewed past Medical, Social and Family history today.  Outpatient Medications Prior to Visit  Medication Sig Dispense Refill   acetaminophen (TYLENOL) 325 MG tablet Take 325 mg by mouth daily as needed (pain).     Calcium Carb-Cholecalciferol (CALCIUM 600 + D PO) Take by mouth. BID--otc     Cholecalciferol (VITAMIN D3 PO) Take 1,000 Units by mouth. Daily-otc     Memantine HCl-Donepezil HCl (NAMZARIC) 28-10 MG CP24 Take 1 capsule by mouth daily. 90 capsule 3   Multiple Vitamins-Minerals (MULTIVITAMIN WITH MINERALS) tablet Take 1 tablet by mouth daily with supper. Centrum Silver     nystatin (MYCOSTATIN/NYSTOP) powder Apply topically 2 (two) times daily. To groin and abdomen area 45 g 1   polyethylene glycol (MIRALAX /  GLYCOLAX) packet Take 17 g by mouth daily. 14 each 0   Probiotic Product (PROBIOTIC DAILY PO) Take by mouth. Culturelle--otc     alendronate (FOSAMAX) 70 MG tablet TAKE 1 TABLET(70 MG) BY MOUTH 1 TIME A WEEK WITH A FULL GLASS OF WATER AND ON AN EMPTY STOMACH 12 tablet 1   amLODipine (NORVASC) 2.5 MG tablet Take 1 tablet (2.5 mg total) by mouth 2 (two) times daily. 180 tablet 3   atorvastatin (LIPITOR) 20 MG tablet TAKE 1 TABLET BY MOUTH EVERY DAY FOR CHOLESTEROL 90 tablet 1   metFORMIN (GLUCOPHAGE) 500 MG tablet Take 0.5 tablets (250 mg total) by mouth 2 (two) times daily with a meal. Need office visit for additional refills 45 tablet 0   QUEtiapine (SEROQUEL) 100 MG tablet Take 1 tablet (100 mg total) by mouth 2 (two) times daily. 180 tablet 1   rivastigmine (EXELON) 13.3 MG/24HR Place 1 patch (13.3 mg total) onto the skin daily. 90 patch 3   potassium chloride SA (K-DUR) 20 MEQ tablet Take 1 tablet (20 mEq total) by mouth daily. (Patient not taking: Reported on 04/25/2020) 3 tablet 3   No facility-administered medications prior to visit.    ROS See HPI  Objective:  BP 138/82 (BP Location: Right Arm, Patient Position: Sitting, Cuff Size: Large)    Pulse (!) 102    Temp (!) 97 F (36.1 C) (Temporal)    Ht 5\' 4"  (1.626 m)    Wt 200 lb 12.8 oz (91.1 kg)    SpO2 95%    BMI 34.47 kg/m   Wt Readings from Last 3 Encounters:  04/25/20 200 lb 12.8 oz (91.1 kg)  10/26/19 204 lb 3.2 oz (92.6 kg)  02/23/19 202 lb 6.4 oz (91.8 kg)   Physical Exam Cardiovascular:     Rate and Rhythm: Normal rate and regular rhythm.     Pulses: Normal pulses.     Heart sounds: Normal heart sounds.  Pulmonary:     Effort: Pulmonary effort is normal.     Breath sounds: Normal breath sounds.  Abdominal:     General: There is no distension.  Musculoskeletal:        General: No tenderness.     Right lower leg: No edema.  Neurological:     Mental Status: She is alert and oriented to person, place, and  time.    Assessment & Plan:  This visit occurred during the SARS-CoV-2 public health emergency.  Safety protocols were in place, including screening questions prior to the visit, additional usage of staff PPE, and extensive cleaning of exam room while observing appropriate contact time as indicated for disinfecting solutions.   Carol [name] was seen today for follow-up.  Diagnoses and all orders for this visit:  Essential hypertension, benign -     Comprehensive metabolic panel; Future -     amLODipine (NORVASC) 2.5 MG tablet; Take 1 tablet (2.5 mg total) by mouth 2 (two) times daily.  Influenza vaccine needed -     Flu Vaccine QUAD High Dose(Fluad)  Mixed Alzheimer's and vascular dementia (HCC) -     rivastigmine (EXELON) 13.3 MG/24HR; Place 1 patch (13.3 mg total) onto the skin daily. -     QUEtiapine (SEROQUEL) 100 MG tablet; Take 1 tablet (100 mg total) by mouth 2 (two) times daily.  Ankle edema, bilateral -     B Nat Peptide; Future  Mixed hyperlipidemia -     CK; Future -     Comprehensive metabolic panel; Future -     Lipid panel; Future -     atorvastatin (LIPITOR) 20 MG tablet; Take 1 tablet (20 mg total) by mouth daily.  Type 2 diabetes mellitus with hyperglycemia, without long-term current use of insulin (HCC) -     Hemoglobin A1c; Future -     Comprehensive metabolic panel; Future -     metFORMIN (GLUCOPHAGE) 500 MG tablet; Take 0.5 tablets (250 mg total) by mouth daily with breakfast.  Osteopenia of lumbar spine -     alendronate (FOSAMAX) 70 MG tablet; TAKE 1 TABLET(70 MG) BY MOUTH 1 TIME A WEEK WITH A FULL GLASS OF WATER AND ON AN EMPTY STOMACH    Problem List Items Addressed This Visit      Cardiovascular and Mediastinum   Essential hypertension, benign - Primary    BP at goal with amlodipine 2.5mg  BID Persistent ankle edema possible due to amlodipine Normal BNP and clear lungs Echocardiogram complete 01/2019: EF of 55-60%. The cavity size was normal. Left  ventricular diastolic  with impaired relaxation. No evidence of left ventricular regional wall. Mild sclerosis of the aortic valve.  Aortic valve regurgitation is mild. Mild aortic annular calcification noted.There is mild dilatation of the ascending aorta measuring 39 mm. BP Readings from Last 3 Encounters:  04/25/20 138/82  10/26/19 122/72  02/23/19 118/78   Change amlodipine to losartan?      Relevant Medications   atorvastatin (LIPITOR) 20 MG tablet   amLODipine (NORVASC) 2.5 MG tablet   Other Relevant Orders   Comprehensive metabolic panel (Completed)   Mixed Alzheimer's and vascular dementia (HCC)   Relevant  Medications   rivastigmine (EXELON) 13.3 MG/24HR   QUEtiapine (SEROQUEL) 100 MG tablet   atorvastatin (LIPITOR) 20 MG tablet   amLODipine (NORVASC) 2.5 MG tablet     Endocrine   Type 2 diabetes mellitus with hyperglycemia, without long-term current use of insulin (HCC)    Stable HgbA1c at 6.3 Maintain metformin 250mg  daily      Relevant Medications   atorvastatin (LIPITOR) 20 MG tablet   metFORMIN (GLUCOPHAGE) 500 MG tablet   Other Relevant Orders   Hemoglobin A1c (Completed)   Comprehensive metabolic panel (Completed)     Musculoskeletal and Integument   Osteopenia   Relevant Medications   alendronate (FOSAMAX) 70 MG tablet     Other   Hyperlipidemia   Relevant Medications   atorvastatin (LIPITOR) 20 MG tablet   amLODipine (NORVASC) 2.5 MG tablet   Other Relevant Orders   CK (Completed)   Comprehensive metabolic panel (Completed)   Lipid panel (Completed)    Other Visit Diagnoses    Influenza vaccine needed       Relevant Orders   Flu Vaccine QUAD High Dose(Fluad) (Completed)   Ankle edema, bilateral       Relevant Orders   B Nat Peptide (Completed)      Follow-up: Return in about 6 months (around 10/23/2020) for DM and HTN, hyperlipidemia (F2F, 10/25/2020).  , NP

## 2020-04-25 NOTE — Patient Instructions (Addendum)
Go to [address] for blood draw. Will need to be fasting at least 6hrs prior to blood draw.  Ok to drink water.  Will send metformin refill after review of lab results.

## 2020-04-26 ENCOUNTER — Other Ambulatory Visit (INDEPENDENT_AMBULATORY_CARE_PROVIDER_SITE_OTHER): Payer: Medicare PPO

## 2020-04-26 DIAGNOSIS — M25472 Effusion, left ankle: Secondary | ICD-10-CM

## 2020-04-26 DIAGNOSIS — E782 Mixed hyperlipidemia: Secondary | ICD-10-CM

## 2020-04-26 DIAGNOSIS — I10 Essential (primary) hypertension: Secondary | ICD-10-CM | POA: Diagnosis not present

## 2020-04-26 DIAGNOSIS — E1165 Type 2 diabetes mellitus with hyperglycemia: Secondary | ICD-10-CM

## 2020-04-26 DIAGNOSIS — M25471 Effusion, right ankle: Secondary | ICD-10-CM

## 2020-04-26 LAB — LIPID PANEL
Cholesterol: 154 mg/dL (ref 0–200)
HDL: 59.8 mg/dL (ref >39.00)
LDL Cholesterol: 78 mg/dL (ref 0–99)
NonHDL: 93.82
Total CHOL/HDL Ratio: 3
Triglycerides: 78 mg/dL (ref 0.0–149.0)
VLDL: 15.6 mg/dL (ref 0.0–40.0)

## 2020-04-26 LAB — HEMOGLOBIN A1C: Hgb A1c MFr Bld: 6.3 % (ref 4.6–6.5)

## 2020-04-26 LAB — COMPREHENSIVE METABOLIC PANEL
ALT: 5 U/L (ref 0–35)
AST: 13 U/L (ref 0–37)
Albumin: 3.5 g/dL (ref 3.5–5.2)
Alkaline Phosphatase: 63 U/L (ref 39–117)
BUN: 11 mg/dL (ref 6–23)
CO2: 27 mEq/L (ref 19–32)
Calcium: 9.1 mg/dL (ref 8.4–10.5)
Chloride: 104 mEq/L (ref 96–112)
Creatinine, Ser: 1.05 mg/dL (ref 0.40–1.20)
GFR: 49.49 mL/min — ABNORMAL LOW (ref >60.00)
Glucose, Bld: 103 mg/dL — ABNORMAL HIGH (ref 70–99)
Potassium: 4.2 mEq/L (ref 3.5–5.1)
Sodium: 138 mEq/L (ref 135–145)
Total Bilirubin: 0.6 mg/dL (ref 0.2–1.2)
Total Protein: 7.7 g/dL (ref 6.0–8.3)

## 2020-04-26 LAB — CK: Total CK: 88 U/L (ref 7–177)

## 2020-04-26 LAB — BRAIN NATRIURETIC PEPTIDE: Pro B Natriuretic peptide (BNP): 83 pg/mL (ref 0.0–100.0)

## 2020-04-27 ENCOUNTER — Other Ambulatory Visit: Payer: Self-pay | Admitting: Nurse Practitioner

## 2020-04-27 DIAGNOSIS — F0280 Dementia in other diseases classified elsewhere without behavioral disturbance: Secondary | ICD-10-CM

## 2020-04-27 DIAGNOSIS — E782 Mixed hyperlipidemia: Secondary | ICD-10-CM

## 2020-04-27 DIAGNOSIS — F0150 Vascular dementia without behavioral disturbance: Secondary | ICD-10-CM

## 2020-04-27 NOTE — Telephone Encounter (Signed)
Last fill for both medications 04/25/20 #90/3  #180/3 Patient should have 3 refills left.

## 2020-04-29 ENCOUNTER — Telehealth: Payer: Self-pay | Admitting: Nurse Practitioner

## 2020-04-29 DIAGNOSIS — E1165 Type 2 diabetes mellitus with hyperglycemia: Secondary | ICD-10-CM | POA: Insufficient documentation

## 2020-04-29 MED ORDER — METFORMIN HCL 500 MG PO TABS: 250.0000 mg | ORAL_TABLET | Freq: Every day | ORAL | 1 refills | Status: DC

## 2020-04-29 NOTE — Assessment & Plan Note (Addendum)
BP at goal with amlodipine 2.5mg  BID Persistent ankle edema possible due to amlodipine Normal BNP and clear lungs Echocardiogram complete 01/2019: EF of 55-60%. The cavity size was normal. Left ventricular diastolic  with impaired relaxation. No evidence of left ventricular regional wall. Mild sclerosis of the aortic valve.  Aortic valve regurgitation is mild. Mild aortic annular calcification noted.There is mild dilatation of the ascending aorta measuring 39 mm. BP Readings from Last 3 Encounters:  04/25/20 138/82  10/26/19 122/72  02/23/19 118/78   Change amlodipine to losartan?

## 2020-04-29 NOTE — Assessment & Plan Note (Signed)
Stable HgbA1c at 6.3 Maintain metformin 250mg  daily

## 2020-04-29 NOTE — Telephone Encounter (Signed)
Stable lab results except continuous decline in renal function. Continue other medications, but decrease metformin to 250mg  daily. Ankle edema could be due to amlodipine, which is a common side effect. Are you ok with switching medication to losartan?

## 2020-04-30 NOTE — Telephone Encounter (Signed)
Other alternative will be to stop the medication and continue to monitor hgbA1c every 60months

## 2020-04-30 NOTE — Telephone Encounter (Signed)
Please advise 

## 2020-04-30 NOTE — Telephone Encounter (Signed)
Pt daughter, Cameron Proud, calling back.  Ph # [phone]  [name] and her 2 sisters want to know if there is an alternative or other option instead of metformin due to the decline in renal function.

## 2020-05-01 ENCOUNTER — Telehealth: Payer: Self-pay | Admitting: Nurse Practitioner

## 2020-05-01 NOTE — Telephone Encounter (Signed)
Left message for patient to schedule Annual Wellness Visit.  Please schedule with Nurse Health Advisor Martha Stanley, RN at [facility]  °

## 2020-05-01 NOTE — Telephone Encounter (Signed)
Pt daughter notified and states she will discuss with family.

## 2020-07-26 ENCOUNTER — Ambulatory Visit (INDEPENDENT_AMBULATORY_CARE_PROVIDER_SITE_OTHER): Payer: Medicare PPO | Admitting: Internal Medicine

## 2020-07-26 ENCOUNTER — Other Ambulatory Visit: Payer: Self-pay

## 2020-07-26 ENCOUNTER — Encounter: Payer: Self-pay | Admitting: Internal Medicine

## 2020-07-26 VITALS — BP 122/78 | HR 97 | Temp 97.8°F | Ht 64.0 in | Wt 193.0 lb

## 2020-07-26 DIAGNOSIS — M8588 Other specified disorders of bone density and structure, other site: Secondary | ICD-10-CM

## 2020-07-26 DIAGNOSIS — E1165 Type 2 diabetes mellitus with hyperglycemia: Secondary | ICD-10-CM

## 2020-07-26 DIAGNOSIS — B372 Candidiasis of skin and nail: Secondary | ICD-10-CM

## 2020-07-26 DIAGNOSIS — G309 Alzheimer's disease, unspecified: Secondary | ICD-10-CM

## 2020-07-26 DIAGNOSIS — F0150 Vascular dementia without behavioral disturbance: Secondary | ICD-10-CM

## 2020-07-26 DIAGNOSIS — I10 Essential (primary) hypertension: Secondary | ICD-10-CM

## 2020-07-26 DIAGNOSIS — E782 Mixed hyperlipidemia: Secondary | ICD-10-CM | POA: Diagnosis not present

## 2020-07-26 DIAGNOSIS — F0280 Dementia in other diseases classified elsewhere without behavioral disturbance: Secondary | ICD-10-CM

## 2020-07-26 MED ORDER — ACETAMINOPHEN 325 MG PO TABS
325.0000 mg | ORAL_TABLET | Freq: Every day | ORAL | 4 refills | Status: DC | PRN
Start: 2020-07-26 — End: 2022-11-05

## 2020-07-26 MED ORDER — RIVASTIGMINE 13.3 MG/24HR TD PT24: 13.3000 mg | MEDICATED_PATCH | Freq: Every day | TRANSDERMAL | 3 refills | Status: DC

## 2020-07-26 MED ORDER — ALENDRONATE SODIUM 70 MG PO TABS: ORAL_TABLET | ORAL | 4 refills | Status: DC

## 2020-07-26 MED ORDER — AMLODIPINE BESYLATE 2.5 MG PO TABS: 2.5000 mg | ORAL_TABLET | Freq: Two times a day (BID) | ORAL | 4 refills | Status: DC

## 2020-07-26 MED ORDER — QUETIAPINE FUMARATE 100 MG PO TABS: 100.0000 mg | ORAL_TABLET | Freq: Two times a day (BID) | ORAL | 3 refills | Status: DC

## 2020-07-26 MED ORDER — ATORVASTATIN CALCIUM 20 MG PO TABS: 20.0000 mg | ORAL_TABLET | Freq: Every day | ORAL | 4 refills | Status: DC

## 2020-07-26 MED ORDER — NAMZARIC 28-10 MG PO CP24: 1.0000 | ORAL_CAPSULE | Freq: Every day | ORAL | 4 refills | Status: DC

## 2020-07-26 MED ORDER — NYSTATIN 100000 UNIT/GM EX POWD: Freq: Two times a day (BID) | CUTANEOUS | 4 refills | Status: DC

## 2020-07-26 NOTE — Progress Notes (Signed)
Provider:  Gwenith Spitz. Renato Gails, D.O., C.M.D. Location:   [facility]   Place of Service:   clinic  Previous PCP: Nche, Bonna Gains, NP [name] Care Team: Nche, Bonna Gains, NP as PCP - General (Internal Medicine)  Extended Emergency Contact Information Primary Emergency Contact: Riki Sheer [place] Mobile Phone: [phone] Relation: Daughter Secondary Emergency Contact: Pinnix,Teressa  [place] Mobile Phone: [phone] Relation: Daughter  Code Status: full code at present Goals of Care: Advanced Directive information Advanced Directives 07/26/2020  Does [name] Have a Medical Advance Directive? Yes  Type of Advance Directive Healthcare Power of Attorney  Does [name] want to make changes to medical advance directive? No - [name] declined  Copy of Healthcare Power of Attorney in Chart? Yes - validated most recent copy scanned in chart (See row information)  Would [name] like information on creating a medical advance directive? -   Chief Complaint  [name] presents with  . Establish Care    New [name] to Establish Care  . Health Maintenance    Discuss foot exam,eye exam and urine micrallbumin    HPI: [name] is a 85 y.o. female seen today to establish with [facility].  Records have been requested from   She's hoarse for a while.  Unclear how diabetes is doing.  Has not had metformin since Nov.  She was getting lows before.    BP fluctuates:  Look reasonable though with many in 110s-120s, rarely 150s or 160s.  Appetite not great:  Will eat peanut butter and jelly and fruit and some of meals on wheels.  Sits in lift chair--works puzzles, reads paper and watches tv.    Feels fine.    Some morning holds knees.  Ankles swell.  Wearing her compression hose.  Has 24 hr care b/w two daughters and company to give them relief.  Dad is at [place] in [place].    Hyperlipidemia:  On atorvastatin.    Alzheimer's/dementia.  On namzaric and  exelon patch.   Is on seroquel 100mg  po bid for mood swings.    On alendronate each Thursday for osteoporosis.  Getting medicated powder under breasts and belly and desitin on bottom.  Has healed area that's discolored in gluteal crease.  Has hemorrhoids.  Uses stool softener if constipated.  sometimes diarrhea and imodium needed.  Has had memory loss probably 10-13 yrs now. She used to bless them out, was yelling, rude, tried to kick them out of her house all before seroquel.  She would make a fist like she'd hit but did not do it.  They tried to reduce her to one per day and she got agitated again.   Example, time to go to Carol bathroom, gets upset and agitated despite lift chair.    She eats on her own, can use Carol bathroom on her own after helped out of lift chair.  Bed does rise up which helps.  Uses briefs for incontinence of bowels and bladder.   It's hard to get her to drink.    Past Medical History:  Diagnosis Date  . Anxiety state, unspecified   . Cataract   . Depressive disorder, not elsewhere classified   . Lumbago   . Memory loss   . Other and unspecified hyperlipidemia   . Rickets, late effect   . Tobacco use disorder   . Unspecified constipation   . Unspecified essential hypertension   . Varicose veins of lower extremities    Past Surgical History:  Procedure Laterality Date  . CYST EXCISION Bilateral    behind ears  . MOUTH SURGERY  04/12/2014    Social History   Socioeconomic History  . Marital status: Married    Spouse name: Not on file  . Number of children: Not on file  . Years of education: Not on file  . Highest education level: Not on file  Occupational History  . Not on file  Tobacco Use  . Smoking status: Former Smoker    Years: 55.00    Quit date: 05/09/2017    Years since quitting: 3.2  . Smokeless tobacco: Never Used  Vaping Use  . Vaping Use: Never used  Substance and Sexual Activity  . Alcohol use: No    Alcohol/week: 0.0 standard  drinks  . Drug use: No  . Sexual activity: Not on file  Other Topics Concern  . Not on file  Social History Narrative  . Not on file   Social Determinants of Health   Financial Resource Strain: Not on file  Food Insecurity: Not on file  Transportation Needs: Not on file  Physical Activity: Not on file  Stress: Not on file  Social Connections: Not on file    reports that she quit smoking about 3 years ago. She quit after 55.00 years of use. She has never used smokeless tobacco. She reports that she does not drink alcohol and does not use drugs.  Functional Status Survey:    Family History  Problem Relation Age of Onset  . Heart disease Father     Health Maintenance  Topic Date Due  . FOOT EXAM  Never done  . OPHTHALMOLOGY EXAM  Never done  . URINE MICROALBUMIN  Never done  . HEMOGLOBIN A1C  10/24/2020  . TETANUS/TDAP  06/08/2024  . INFLUENZA VACCINE  Completed  . DEXA SCAN  Completed  . COVID-19 Vaccine  Completed  . PNA vac Low Risk Adult  Completed    Allergies  Allergen Reactions  . Penicillins Swelling and Rash    Tolerate ceftriaxone    Outpatient Encounter Medications as of 07/26/2020  Medication Sig  . Calcium Carb-Cholecalciferol (CALCIUM 600 + D PO) Take 1 tablet by mouth daily.  . Cholecalciferol (VITAMIN D3 PO) Take 1,000 Units by mouth. Daily-otc  . Multiple Vitamins-Minerals (MULTIVITAMIN WITH MINERALS) tablet Take 1 tablet by mouth daily with supper. Centrum Silver  . polyethylene glycol (MIRALAX / GLYCOLAX) packet Take 17 g by mouth daily.  . Probiotic Product (PROBIOTIC DAILY PO) Take by mouth. Culturelle--otc  . [DISCONTINUED] acetaminophen (TYLENOL) 325 MG tablet Take 325 mg by mouth daily as needed (pain).  . [DISCONTINUED] alendronate (FOSAMAX) 70 MG tablet TAKE 1 TABLET(70 MG) BY MOUTH 1 TIME A WEEK WITH A FULL GLASS OF WATER AND ON AN EMPTY STOMACH  . [DISCONTINUED] amLODipine (NORVASC) 2.5 MG tablet Take 1 tablet (2.5 mg total) by mouth 2  (two) times daily.  . [DISCONTINUED] atorvastatin (LIPITOR) 20 MG tablet Take 1 tablet (20 mg total) by mouth daily.  . [DISCONTINUED] Memantine HCl-Donepezil HCl (NAMZARIC) 28-10 MG CP24 Take 1 capsule by mouth daily.  . [DISCONTINUED] metFORMIN (GLUCOPHAGE) 500 MG tablet Take 0.5 tablets (250 mg total) by mouth daily with breakfast.  . [DISCONTINUED] nystatin (MYCOSTATIN/NYSTOP) powder Apply topically 2 (two) times daily. To groin and abdomen area  . [DISCONTINUED] QUEtiapine (SEROQUEL) 100 MG tablet Take 1 tablet (100 mg total) by mouth 2 (two) times daily.  . [DISCONTINUED] rivastigmine (EXELON) 13.3 MG/24HR Place 1 patch (13.3  mg total) onto Carol skin daily.  Marland Kitchen acetaminophen (TYLENOL) 325 MG tablet Take 1 tablet (325 mg total) by mouth daily as needed (pain).  Marland Kitchen alendronate (FOSAMAX) 70 MG tablet TAKE 1 TABLET(70 MG) BY MOUTH 1 TIME A WEEK WITH A FULL GLASS OF WATER AND ON AN EMPTY STOMACH  . amLODipine (NORVASC) 2.5 MG tablet Take 1 tablet (2.5 mg total) by mouth 2 (two) times daily.  Marland Kitchen atorvastatin (LIPITOR) 20 MG tablet Take 1 tablet (20 mg total) by mouth daily.  . Memantine HCl-Donepezil HCl (NAMZARIC) 28-10 MG CP24 Take 1 capsule by mouth daily.  Marland Kitchen nystatin (MYCOSTATIN/NYSTOP) powder Apply topically 2 (two) times daily. To groin and abdomen area  . QUEtiapine (SEROQUEL) 100 MG tablet Take 1 tablet (100 mg total) by mouth 2 (two) times daily.  . [DISCONTINUED] rivastigmine (EXELON) 13.3 MG/24HR Place 1 patch (13.3 mg total) onto Carol skin daily.   No facility-administered encounter medications on file as of 07/26/2020.    Review of Systems  Constitutional: Negative for chills, fever and malaise/fatigue.  Eyes: Negative for blurred vision.       Glasses  Respiratory: Negative for cough and shortness of breath.   Cardiovascular: Negative for chest pain, palpitations and leg swelling.  Gastrointestinal: Negative for abdominal pain, blood in stool, constipation, diarrhea and melena.   Genitourinary: Negative for dysuria.  Musculoskeletal: Negative for falls and joint pain.  Skin: Negative for itching and rash.  Neurological: Negative for dizziness and loss of consciousness.  Psychiatric/Behavioral: Positive for memory loss. Negative for depression. Carol [name] is not nervous/anxious and does not have insomnia.        H/o behaviors, combativeness, verbally aggressive pre-seroquel    Vitals:   07/26/20 1331  BP: 122/78  Pulse: 97  Temp: 97.8 F (36.6 C)  SpO2: 97%  Weight: 193 lb (87.5 kg)  Height: 5\' 4"  (1.626 m)   Body mass index is 33.13 kg/m. Physical Exam Vitals reviewed.  Constitutional:      General: She is not in acute distress.    Appearance: Normal appearance. She is not toxic-appearing.  HENT:     Head: Normocephalic and atraumatic.     Right Ear: External ear normal.     Left Ear: External ear normal.     Nose: Nose normal.     Mouth/Throat:     Pharynx: Oropharynx is clear.  Eyes:     Extraocular Movements: Extraocular movements intact.     Conjunctiva/sclera: Conjunctivae normal.     Pupils: Pupils are equal, round, and reactive to light.     Comments: glasses  Neck:     Comments: Hoarse, no postnasal drip Cardiovascular:     Rate and Rhythm: Normal rate and regular rhythm.     Pulses: Normal pulses.     Heart sounds: Normal heart sounds.  Pulmonary:     Effort: Pulmonary effort is normal.     Breath sounds: Normal breath sounds. No wheezing, rhonchi or rales.  Abdominal:     General: Bowel sounds are normal. There is no distension.     Palpations: Abdomen is soft.     Tenderness: There is no abdominal tenderness. There is no guarding or rebound.  Musculoskeletal:        General: Normal range of motion.     Cervical back: Neck supple. No tenderness.     Right lower leg: No edema.     Left lower leg: No edema.  Lymphadenopathy:     Cervical: No cervical adenopathy.  Skin:    General: Skin is warm and dry.     Capillary  Refill: Capillary refill takes less than 2 seconds.  Neurological:     Mental Status: She is alert.     Gait: Gait abnormal.     Comments: Uses rollator walker with large wheels  Psychiatric:        Mood and Affect: Mood normal.     Labs reviewed: Basic Metabolic Panel: Recent Labs    04/26/20 1051  NA 138  K 4.2  CL 104  CO2 27  GLUCOSE 103*  BUN 11  CREATININE 1.05  CALCIUM 9.1   Liver Function Tests: Recent Labs    04/26/20 1051  AST 13  ALT 5  ALKPHOS 63  BILITOT 0.6  PROT 7.7  ALBUMIN 3.5   No results for input(s): LIPASE, AMYLASE in Carol last 8760 hours. No results for input(s): AMMONIA in Carol last 8760 hours. CBC: No results for input(s): WBC, NEUTROABS, HGB, HCT, MCV, PLT in Carol last 8760 hours. Cardiac Enzymes: Recent Labs    04/26/20 1051  CKTOTAL 88   BNP: Invalid input(s): POCBNP Lab Results  Component Value Date   HGBA1C 6.3 04/26/2020   Lab Results  Component Value Date   TSH 2.26 03/02/2019   Lab Results  Component Value Date   VITAMINB12 549 01/25/2013   No results found for: FOLATE No results found for: IRON, TIBC, FERRITIN   Assessment/Plan 1. Osteopenia of lumbar spine - is on fosamax - f/u bone density asap and determine if she should still be on this or an alternative since she's taken many years now (probably needs to come off) - alendronate (FOSAMAX) 70 MG tablet; TAKE 1 TABLET(70 MG) BY MOUTH 1 TIME A WEEK WITH A FULL GLASS OF WATER AND ON AN EMPTY STOMACH  Dispense: 12 tablet; Refill: 4  2. Essential hypertension, benign -bp at goal with current regimen majority of time - amLODipine (NORVASC) 2.5 MG tablet; Take 1 tablet (2.5 mg total) by mouth 2 (two) times daily.  Dispense: 180 tablet; Refill: 4  3. Mixed hyperlipidemia - cont current statin and f/u labs in future - atorvastatin (LIPITOR) 20 MG tablet; Take 1 tablet (20 mg total) by mouth daily.  Dispense: 90 tablet; Refill: 4  4. Mixed Alzheimer's and vascular  dementia (HCC) -d/c exelon b/c already on aricept in namzaric and duplicate therapy - Memantine HCl-Donepezil HCl (NAMZARIC) 28-10 MG CP24; Take 1 capsule by mouth daily.  Dispense: 90 capsule; Refill: 4 - reviewed black box warning for seroquel with her daughters who understand but her behaviors previously prevented them from caring for her until it was started -she is now a bit drowsy--we'll see if that changes off exelon -might try to reduced dose of seroquel rather than cutting out a dose which did not go well when tried -QUEtiapine (SEROQUEL) 100 MG tablet; Take 1 tablet (100 mg total) by mouth 2 (two) times daily.  Dispense: 180 tablet; Refill: 3 - rivastigmine (EXELON) 13.3 MG/24HR; Place 1 patch (13.3 mg total) onto Carol skin daily.  Dispense: 90 patch; Refill: 3  5. Cutaneous candidiasis -beneath breasts and abdomen  - nystatin (MYCOSTATIN/NYSTOP) powder; Apply topically 2 (two) times daily. To groin and abdomen area  Dispense: 45 g; Refill: 4  Labs/tests ordered:   Lab Orders     Hemoglobin A1c     CBC with Differential/Platelet     COMPLETE METABOLIC PANEL WITH GFR  F/u in 4 mos in clinic  Lamorris Knoblock  Lynelle Doctor, D.O. [facility] [address], [place] [address] Cell Phone (Mon-Fri 8am-5pm):  [phone] On Call:  [phone] & follow prompts after 5pm & weekends Office Phone:  [phone] Office Fax:  [phone]

## 2020-07-26 NOTE — Patient Instructions (Addendum)
Call breast center for bone density study asap.  We will determine whether to continue fosamax after that.  Also, stop exelon patch because it is duplicate therapy with namzaric.    [employer] has some caregiver education programs.   [employer] also has similar programs.

## 2020-07-27 LAB — HEMOGLOBIN A1C
Hgb A1c MFr Bld: 6.3 % of total Hgb — ABNORMAL HIGH (ref <5.7)
Mean Plasma Glucose: 134 mg/dL
eAG (mmol/L): 7.4 mmol/L

## 2020-07-27 LAB — COMPLETE METABOLIC PANEL WITH GFR
AG Ratio: 1 (calc) (ref 1.0–2.5)
ALT: 4 U/L — ABNORMAL LOW (ref 6–29)
AST: 8 U/L — ABNORMAL LOW (ref 10–35)
Albumin: 3.7 g/dL (ref 3.6–5.1)
Alkaline phosphatase (APISO): 66 U/L (ref 37–153)
BUN/Creatinine Ratio: 13 (calc) (ref 6–22)
BUN: 14 mg/dL (ref 7–25)
CO2: 29 mmol/L (ref 20–32)
Calcium: 9.4 mg/dL (ref 8.6–10.4)
Chloride: 108 mmol/L (ref 98–110)
Creat: 1.12 mg/dL — ABNORMAL HIGH (ref 0.60–0.88)
GFR, Est African American: 53 mL/min/{1.73_m2} — ABNORMAL LOW (ref >=60)
GFR, Est Non African American: 46 mL/min/{1.73_m2} — ABNORMAL LOW (ref >=60)
Globulin: 3.8 g/dL (calc) — ABNORMAL HIGH (ref 1.9–3.7)
Glucose, Bld: 120 mg/dL — ABNORMAL HIGH (ref 65–99)
Potassium: 4.2 mmol/L (ref 3.5–5.3)
Sodium: 145 mmol/L (ref 135–146)
Total Bilirubin: 0.4 mg/dL (ref 0.2–1.2)
Total Protein: 7.5 g/dL (ref 6.1–8.1)

## 2020-07-27 LAB — CBC WITH DIFFERENTIAL/PLATELET
Absolute Monocytes: 501 cells/uL (ref 200–950)
Basophils Absolute: 36 cells/uL (ref 0–200)
Basophils Relative: 0.4 %
Eosinophils Absolute: 173 cells/uL (ref 15–500)
Eosinophils Relative: 1.9 %
HCT: 40.5 % (ref 35.0–45.0)
Hemoglobin: 13.1 g/dL (ref 11.7–15.5)
Lymphs Abs: 3312 cells/uL (ref 850–3900)
MCH: 28.1 pg (ref 27.0–33.0)
MCHC: 32.3 g/dL (ref 32.0–36.0)
MCV: 86.9 fL (ref 80.0–100.0)
MPV: 12.8 fL — ABNORMAL HIGH (ref 7.5–12.5)
Monocytes Relative: 5.5 %
Neutro Abs: 5078 cells/uL (ref 1500–7800)
Neutrophils Relative %: 55.8 %
Platelets: 230 10*3/uL (ref 140–400)
RBC: 4.66 10*6/uL (ref 3.80–5.10)
RDW: 13.2 % (ref 11.0–15.0)
Total Lymphocyte: 36.4 %
WBC: 9.1 10*3/uL (ref 3.8–10.8)

## 2020-07-27 NOTE — Progress Notes (Signed)
Blood counts are normal (no anemia) Kidney function has declined a bit more.  This is likely from not drinking enough water as we discussed during the visit. Her sugar average is stable from November and she does NOT need to be on diabetes medications at this level

## 2020-08-15 ENCOUNTER — Other Ambulatory Visit: Payer: Self-pay | Admitting: Internal Medicine

## 2020-08-15 DIAGNOSIS — M8588 Other specified disorders of bone density and structure, other site: Secondary | ICD-10-CM

## 2020-10-23 ENCOUNTER — Ambulatory Visit: Payer: TRICARE For Life (TFL) | Admitting: Nurse Practitioner

## 2020-11-28 ENCOUNTER — Encounter: Payer: Self-pay | Admitting: Family

## 2020-11-28 ENCOUNTER — Ambulatory Visit: Payer: TRICARE For Life (TFL) | Admitting: Internal Medicine

## 2020-11-28 ENCOUNTER — Other Ambulatory Visit: Payer: Self-pay

## 2020-11-28 ENCOUNTER — Ambulatory Visit (INDEPENDENT_AMBULATORY_CARE_PROVIDER_SITE_OTHER): Payer: Medicare PPO | Admitting: Family

## 2020-11-28 VITALS — BP 138/90 | HR 89 | Temp 97.1°F | Resp 18 | Ht 64.0 in | Wt 189.8 lb

## 2020-11-28 DIAGNOSIS — M8588 Other specified disorders of bone density and structure, other site: Secondary | ICD-10-CM

## 2020-11-28 DIAGNOSIS — E119 Type 2 diabetes mellitus without complications: Secondary | ICD-10-CM | POA: Diagnosis not present

## 2020-11-28 DIAGNOSIS — E782 Mixed hyperlipidemia: Secondary | ICD-10-CM

## 2020-11-28 DIAGNOSIS — G309 Alzheimer's disease, unspecified: Secondary | ICD-10-CM

## 2020-11-28 DIAGNOSIS — K5901 Slow transit constipation: Secondary | ICD-10-CM

## 2020-11-28 DIAGNOSIS — F0280 Dementia in other diseases classified elsewhere without behavioral disturbance: Secondary | ICD-10-CM

## 2020-11-28 DIAGNOSIS — F0150 Vascular dementia without behavioral disturbance: Secondary | ICD-10-CM

## 2020-11-28 DIAGNOSIS — I10 Essential (primary) hypertension: Secondary | ICD-10-CM

## 2020-11-28 MED ORDER — SENNOSIDES-DOCUSATE SODIUM 8.6-50 MG PO TABS: 1.0000 | ORAL_TABLET | Freq: Every day | ORAL | 5 refills | Status: DC

## 2020-11-28 NOTE — Progress Notes (Signed)
Provider: Marlowe Sax FNP-C   Chelsei Mcchesney, Nelda Bucks, NP  Patient Care Team: Lyra Alaimo, Nelda Bucks, NP as PCP - General (Family Medicine)  Extended Emergency Contact Information Primary Emergency Contact: Nolon Rod [place] Mobile Phone: [phone] Relation: Daughter Secondary Emergency Contact: Pinnix,Teressa  [place] [employer] Phone: [phone] Relation: Daughter  Code Status:  Full Code  Goals of care: Advanced Directive information Advanced Directives 11/28/2020  Does Patient Have a Medical Advance Directive? No  Type of Advance Directive -  Does patient want to make changes to medical advance directive? -  Copy of Bruce in Chart? -  Would patient like information on creating a medical advance directive? No - Patient declined     Chief Complaint  Patient presents with   Medical Management of Chronic Issues    4 month follow up   Health Maintenance    Discuss the need for Foot exam, and Eye exam.   Immunizations    Discuss the need for Urine Microalbumin, and 2nd Covid Booster.    HPI:  Pt is a 85 y.o. female seen today for 4 month for medical management of chronic diseases.she is here with her daughter.states had issues with constipation yesterday.Miralax not working.took dulcolax with good results.  Her daughters assist her with her ADL's and also has Home health Aids who helps with her bathing. someone also there with her 24/7 due to dementia.  Has had no fall episode. Appetie is good but sometimes does not eat  She denies any acute issues during visit does provide much HPI information but participate in the exam.    Past Medical History:  Diagnosis Date   Anxiety state, unspecified    Cataract    Depressive disorder, not elsewhere classified    Lumbago    Memory loss    Other and unspecified hyperlipidemia    Rickets, late effect    Tobacco use disorder    Unspecified constipation    Unspecified  essential hypertension    Varicose veins of lower extremities    Past Surgical History:  Procedure Laterality Date   CYST EXCISION Bilateral    behind ears   MOUTH SURGERY  04/12/2014    Allergies  Allergen Reactions   Penicillins Swelling and Rash    Tolerate ceftriaxone    Allergies as of 11/28/2020       Reactions   Penicillins Swelling, Rash   Tolerate ceftriaxone        Medication List        Accurate as of November 28, 2020  3:23 PM. If you have any questions, ask your nurse or doctor.          acetaminophen 325 MG tablet Commonly known as: TYLENOL Take 1 tablet (325 mg total) by mouth daily as needed (pain).   alendronate 70 MG tablet Commonly known as: FOSAMAX TAKE 1 TABLET(70 MG) BY MOUTH 1 TIME A WEEK WITH A FULL GLASS OF WATER AND ON AN EMPTY STOMACH   amLODipine 2.5 MG tablet Commonly known as: NORVASC Take 1 tablet (2.5 mg total) by mouth 2 (two) times daily.   atorvastatin 20 MG tablet Commonly known as: LIPITOR Take 1 tablet (20 mg total) by mouth daily.   CALCIUM 600 + D PO Take 1 tablet by mouth daily.   multivitamin with minerals tablet Take 1 tablet by mouth daily with supper. Centrum Silver   Namzaric 28-10 MG Cp24 Generic drug: Memantine HCl-Donepezil HCl Take 1 capsule  by mouth daily.   nystatin powder Commonly known as: MYCOSTATIN/NYSTOP Apply topically 2 (two) times daily. To groin and abdomen area   polyethylene glycol 17 g packet Commonly known as: MIRALAX / GLYCOLAX Take 17 g by mouth daily.   PROBIOTIC DAILY PO Take by mouth. Culturelle--otc   QUEtiapine 100 MG tablet Commonly known as: SEROQUEL Take 1 tablet (100 mg total) by mouth 2 (two) times daily.   VITAMIN D3 PO Take 1,000 Units by mouth. Daily-otc        Review of Systems  Constitutional:  Negative for appetite change, chills, fatigue, fever and unexpected weight change.  HENT:  Positive for hearing loss. Negative for congestion, dental problem, ear  discharge, ear pain, facial swelling, nosebleeds, postnasal drip, rhinorrhea, sinus pressure, sinus pain, sneezing, sore throat, tinnitus and trouble swallowing.   Eyes:  Positive for visual disturbance. Negative for pain, discharge, redness and itching.       Need referral to ophthalmology   Respiratory:  Negative for cough, chest tightness, shortness of breath and wheezing.   Cardiovascular:  Negative for chest pain, palpitations and leg swelling.  Gastrointestinal:  Negative for abdominal distention, abdominal pain, blood in stool, constipation, diarrhea, nausea and vomiting.  Endocrine: Negative for cold intolerance, heat intolerance, polydipsia, polyphagia and polyuria.  Genitourinary:  Negative for difficulty urinating, dysuria, flank pain, frequency and urgency.  Musculoskeletal:  Positive for arthralgias, back pain and gait problem. Negative for joint swelling, myalgias, neck pain and neck stiffness.  Skin:  Negative for color change, pallor, rash and wound.  Neurological:  Negative for dizziness, syncope, speech difficulty, weakness, light-headedness, numbness and headaches.  Hematological:  Does not bruise/bleed easily.  Psychiatric/Behavioral:  Negative for agitation, behavioral problems, confusion, hallucinations, self-injury, sleep disturbance and suicidal ideas. The patient is not nervous/anxious.    Immunization History  Administered Date(s) Administered   Fluad Quad(high Dose 65+) 02/23/2019, 04/25/2020   Influenza Whole 04/06/2012   Influenza,inj,Quad PF,6+ Mos 03/01/2013, 06/08/2014, 02/16/2015, 02/01/2016, 03/03/2017, 03/24/2018   PFIZER(Purple Top)SARS-COV-2 Vaccination 07/14/2019, 08/04/2019, 05/10/2020   Pneumococcal Conjugate-13 12/15/2014   Pneumococcal Polysaccharide-23 07/10/2011   Td 07/10/2011   Tdap 06/08/2014   Zoster Recombinat (Shingrix) 11/19/2017, 04/12/2018, 05/10/2018, 05/12/2018   Pertinent  Health Maintenance Due  Topic Date Due   FOOT EXAM  Never  done   OPHTHALMOLOGY EXAM  Never done   URINE MICROALBUMIN  Never done   INFLUENZA VACCINE  01/07/2021   HEMOGLOBIN A1C  01/23/2021   DEXA SCAN  Completed   PNA vac Low Risk Adult  Completed   Fall Risk  11/28/2020 07/26/2020 10/26/2019 02/23/2019 07/27/2018  Falls in the past year? 0 0 0 0 1  Number falls in past yr: 0 0 0 0 0  Injury with Fall? 0 0 0 0 0  Comment - - - - -  Risk for fall due to : No Fall Risks - - History of fall(s);Impaired balance/gait;Mental status change -  Follow up - - - Falls evaluation completed;Falls prevention discussed -   Functional Status Survey:    Vitals:   11/28/20 1507  BP: 138/90  Pulse: 89  Resp: 18  Temp: (!) 97.1 F (36.2 C)  SpO2: 94%  Weight: 189 lb 12.8 oz (86.1 kg)  Height: 5' 4"  (1.626 m)   Body mass index is 32.58 kg/m. Physical Exam Vitals reviewed.  Constitutional:      General: She is not in acute distress.    Appearance: Normal appearance. She is normal weight. She is not  ill-appearing or diaphoretic.  HENT:     Head: Normocephalic.     Right Ear: Tympanic membrane, ear canal and external ear normal. There is no impacted cerumen.     Left Ear: Tympanic membrane, ear canal and external ear normal. There is no impacted cerumen.     Nose: Nose normal. No congestion or rhinorrhea.     Mouth/Throat:     Mouth: Mucous membranes are moist.     Pharynx: Oropharynx is clear. No oropharyngeal exudate or posterior oropharyngeal erythema.  Eyes:     General: No scleral icterus.       Right eye: No discharge.        Left eye: No discharge.     Extraocular Movements: Extraocular movements intact.     Conjunctiva/sclera: Conjunctivae normal.     Pupils: Pupils are equal, round, and reactive to light.  Neck:     Vascular: No carotid bruit.  Cardiovascular:     Rate and Rhythm: Normal rate and regular rhythm.     Pulses: Normal pulses.     Heart sounds: Normal heart sounds. No murmur heard.   No friction rub. No gallop.   Pulmonary:     Effort: Pulmonary effort is normal. No respiratory distress.     Breath sounds: Normal breath sounds. No wheezing, rhonchi or rales.  Chest:     Chest wall: No tenderness.  Abdominal:     General: Bowel sounds are normal. There is no distension.     Palpations: Abdomen is soft. There is no mass.     Tenderness: There is no abdominal tenderness. There is no right CVA tenderness, left CVA tenderness, guarding or rebound.  Musculoskeletal:        General: No swelling or tenderness.     Cervical back: Normal range of motion. No rigidity or tenderness.     Right lower leg: No edema.     Left lower leg: No edema.     Comments: Unsteady gait walks with a Rolator   Lymphadenopathy:     Cervical: No cervical adenopathy.  Skin:    General: Skin is warm and dry.     Coloration: Skin is not pale.     Findings: No bruising, erythema, lesion or rash.  Neurological:     Mental Status: She is alert. Mental status is at baseline.     Cranial Nerves: No cranial nerve deficit.     Sensory: No sensory deficit.     Motor: No weakness.     Coordination: Coordination normal.     Gait: Gait abnormal.  Psychiatric:        Mood and Affect: Mood normal.        Speech: Speech normal.        Behavior: Behavior normal.        Thought Content: Thought content normal.        Judgment: Judgment normal.    Labs reviewed: Recent Labs    04/26/20 1051 07/26/20 1501  NA 138 145  K 4.2 4.2  CL 104 108  CO2 27 29  GLUCOSE 103* 120*  BUN 11 14  CREATININE 1.05 1.12*  CALCIUM 9.1 9.4   Recent Labs    04/26/20 1051 07/26/20 1501  AST 13 8*  ALT 5 4*  ALKPHOS 63  --   BILITOT 0.6 0.4  PROT 7.7 7.5  ALBUMIN 3.5  --    Recent Labs    07/26/20 1501  WBC 9.1  NEUTROABS 5,078  HGB 13.1  HCT 40.5  MCV 86.9  PLT 230   Lab Results  Component Value Date   TSH 2.26 03/02/2019   Lab Results  Component Value Date   HGBA1C 6.3 (H) 07/26/2020   Lab Results  Component Value  Date   CHOL 154 04/26/2020   HDL 59.80 04/26/2020   LDLCALC 78 04/26/2020   TRIG 78.0 04/26/2020   CHOLHDL 3 04/26/2020    Significant Diagnostic Results in last 30 days:  No results found.  Assessment/Plan 1. Essential hypertension, benign B/p well controlled. Continue on current medication  - CBC with Differential/Platelet; Future - CMP with eGFR([facility]); Future - TSH; Future  2. Mixed hyperlipidemia Previous LDL at [facility]  - continue on lipitor. - Lipid panel; Future  3. Type 2 diabetes mellitus without complication, without long-term current use of insulin ([place]) Lab Results  Component Value Date   HGBA1C 6.3 (H) 07/26/2020  Well controlled  Off medication  Continue dietary modification and exercise as tolerated. - Ambulatory referral to Ophthalmology - Ambulatory referral to [facility] / creatinine urine ratio; Future  4. Osteopenia of lumbar spine Continue on calcium and vit D supplement - continue on fosamax  Weight bearing exercise as tolerated  5. Mixed Alzheimer's and vascular dementia ([place]) No new behavioral issues - daughters very supportive for her care. Continue on Namzaric 28 -10 capsule daily and Seroquel   6. Slow transit constipation - encouraged to increase fiber in diet  - increase water intake to 6-8 glasses of water daily and exercise as tolerated  - continue on miralax  - will add senna one daily.Daughter advised to hold for loose stool.   Family/ staff Communication: Reviewed plan of care with patient and daughter   Labs/tests ordered:  - CBC with Differential/Platelet; Future - CMP with eGFR([facility]); Future - TSH; Future - HgbA1C  - Lipid panel; Future  Next Appointment : 6 months for medical management of chronic issues.Fasting Labs and Urine MicroAlbumin in 1 week or sooner.    Sandrea Hughs, NP

## 2020-12-06 ENCOUNTER — Other Ambulatory Visit: Payer: Medicare PPO

## 2020-12-06 ENCOUNTER — Other Ambulatory Visit: Payer: Self-pay

## 2020-12-06 DIAGNOSIS — I10 Essential (primary) hypertension: Secondary | ICD-10-CM

## 2020-12-06 DIAGNOSIS — E782 Mixed hyperlipidemia: Secondary | ICD-10-CM

## 2020-12-06 DIAGNOSIS — E119 Type 2 diabetes mellitus without complications: Secondary | ICD-10-CM

## 2020-12-07 ENCOUNTER — Other Ambulatory Visit: Payer: Self-pay

## 2020-12-07 ENCOUNTER — Other Ambulatory Visit: Payer: Self-pay | Admitting: Family

## 2020-12-07 LAB — CBC WITH DIFFERENTIAL/PLATELET
Absolute Monocytes: 466 cells/uL (ref 200–950)
Basophils Absolute: 47 cells/uL (ref 0–200)
Basophils Relative: 0.6 %
Eosinophils Absolute: 253 cells/uL (ref 15–500)
Eosinophils Relative: 3.2 %
HCT: 42.9 % (ref 35.0–45.0)
Hemoglobin: 14 g/dL (ref 11.7–15.5)
Lymphs Abs: 3231 cells/uL (ref 850–3900)
MCH: 28.2 pg (ref 27.0–33.0)
MCHC: 32.6 g/dL (ref 32.0–36.0)
MCV: 86.3 fL (ref 80.0–100.0)
MPV: 12.5 fL (ref 7.5–12.5)
Monocytes Relative: 5.9 %
Neutro Abs: 3903 cells/uL (ref 1500–7800)
Neutrophils Relative %: 49.4 %
Platelets: 211 10*3/uL (ref 140–400)
RBC: 4.97 10*6/uL (ref 3.80–5.10)
RDW: 13.6 % (ref 11.0–15.0)
Total Lymphocyte: 40.9 %
WBC: 7.9 10*3/uL (ref 3.8–10.8)

## 2020-12-07 LAB — COMPLETE METABOLIC PANEL WITH GFR
AG Ratio: 0.9 (calc) — ABNORMAL LOW (ref 1.0–2.5)
ALT: 4 U/L — ABNORMAL LOW (ref 6–29)
AST: 11 U/L (ref 10–35)
Albumin: 3.7 g/dL (ref 3.6–5.1)
Alkaline phosphatase (APISO): 68 U/L (ref 37–153)
BUN/Creatinine Ratio: 14 (calc) (ref 6–22)
BUN: 14 mg/dL (ref 7–25)
CO2: 24 mmol/L (ref 20–32)
Calcium: 9.3 mg/dL (ref 8.6–10.4)
Chloride: 106 mmol/L (ref 98–110)
Creat: 1.03 mg/dL — ABNORMAL HIGH (ref 0.60–0.88)
GFR, Est African American: 58 mL/min/{1.73_m2} — ABNORMAL LOW (ref >=60)
GFR, Est Non African American: 50 mL/min/{1.73_m2} — ABNORMAL LOW (ref >=60)
Globulin: 3.9 g/dL (calc) — ABNORMAL HIGH (ref 1.9–3.7)
Glucose, Bld: 95 mg/dL (ref 65–99)
Potassium: 4.1 mmol/L (ref 3.5–5.3)
Sodium: 142 mmol/L (ref 135–146)
Total Bilirubin: 0.7 mg/dL (ref 0.2–1.2)
Total Protein: 7.6 g/dL (ref 6.1–8.1)

## 2020-12-07 LAB — HEMOGLOBIN A1C
Hgb A1c MFr Bld: 6.1 % of total Hgb — ABNORMAL HIGH (ref <5.7)
Mean Plasma Glucose: 128 mg/dL
eAG (mmol/L): 7.1 mmol/L

## 2020-12-07 LAB — LIPID PANEL
Cholesterol: 163 mg/dL (ref <200)
HDL: 63 mg/dL (ref >=50)
LDL Cholesterol (Calc): 83 mg/dL (calc)
Non-HDL Cholesterol (Calc): 100 mg/dL (calc) (ref <130)
Total CHOL/HDL Ratio: 2.6 (calc) (ref <5.0)
Triglycerides: 77 mg/dL (ref <150)

## 2020-12-07 LAB — MICROALBUMIN / CREATININE URINE RATIO
Creatinine, Urine: 224 mg/dL (ref 20–275)
Microalb Creat Ratio: 142 mcg/mg creat — ABNORMAL HIGH (ref <30)
Microalb, Ur: 31.8 mg/dL

## 2020-12-07 LAB — TSH: TSH: 2.04 mIU/L (ref 0.40–4.50)

## 2020-12-07 MED ORDER — LISINOPRIL 5 MG PO TABS: 5.0000 mg | ORAL_TABLET | Freq: Every day | ORAL | 1 refills | Status: DC

## 2020-12-13 ENCOUNTER — Other Ambulatory Visit: Payer: Self-pay

## 2020-12-13 ENCOUNTER — Telehealth: Payer: Self-pay | Admitting: *Deleted

## 2020-12-13 ENCOUNTER — Ambulatory Visit (INDEPENDENT_AMBULATORY_CARE_PROVIDER_SITE_OTHER): Payer: Medicare PPO | Admitting: Podiatry

## 2020-12-13 ENCOUNTER — Encounter: Payer: Self-pay | Admitting: Podiatry

## 2020-12-13 DIAGNOSIS — M79675 Pain in left toe(s): Secondary | ICD-10-CM | POA: Diagnosis not present

## 2020-12-13 DIAGNOSIS — M79674 Pain in right toe(s): Secondary | ICD-10-CM

## 2020-12-13 DIAGNOSIS — B351 Tinea unguium: Secondary | ICD-10-CM | POA: Diagnosis not present

## 2020-12-13 NOTE — Telephone Encounter (Signed)
Yes.Need to continue with amlodipine and low dose lisinopril.Lisinopril was prescribed to protect her kidneys due to Type 2 Diabetes.Her recent urine microAlbumin ration showed she is passing protein in the urine so lisinopril is recommended to protect the kidneys.

## 2020-12-13 NOTE — Telephone Encounter (Signed)
Called and discussed with the patient's daughter, no further questions.

## 2020-12-13 NOTE — Telephone Encounter (Signed)
Carol [name], daughter, called and stated that patient was prescribed Lisinopril. Stated that patient is already taking Amlodipine.   Daughter is wanting to make sure she is suppose to be taking both medications and didn't need to stop one of them.    Please Advise.

## 2020-12-25 NOTE — Progress Notes (Signed)
Subjective:   Patient ID: Carol [name], female   DOB: 85 y.o.   MRN: [id number]   HPI Patient presents stating she is got thickened nailbeds 1-5 both feet that she cannot take care of and she is tried different techniques without resolution.  States they get sore and make shoe gear difficult and she cannot take care of them herself and does have long-term diabetes.  Patient does not smoke and is not active   Review of Systems  All other systems reviewed and are negative.      Objective:  Physical Exam Vitals and nursing note reviewed.  Constitutional:      Appearance: She is well-developed.  Pulmonary:     Effort: Pulmonary effort is normal.  Musculoskeletal:        General: Normal range of motion.  Skin:    General: Skin is warm.  Neurological:     Mental Status: She is alert.    Neurovascular status intact muscle strength was found to be adequate range of motion adequate.  Patient is found to have yellow dystrophic thickened brittle nailbeds 1-5 both feet thick and painful in the corners and painful with shoe gear due to the thickness and elongation.  Good digital perfusion     Assessment:  Chronic mycotic nail infection 1-5 both feet she cannot take care of along with diabetes under reasonable control     Plan:  H&P performed debridement of nailbeds 1-5 both feet no iatrogenic bleeding reappoint routine care

## 2021-01-22 ENCOUNTER — Other Ambulatory Visit: Payer: Self-pay | Admitting: Nurse Practitioner

## 2021-01-22 DIAGNOSIS — F0150 Vascular dementia without behavioral disturbance: Secondary | ICD-10-CM

## 2021-01-22 DIAGNOSIS — G309 Alzheimer's disease, unspecified: Secondary | ICD-10-CM

## 2021-01-23 ENCOUNTER — Ambulatory Visit
Admission: RE | Admit: 2021-01-23 | Discharge: 2021-01-23 | Disposition: A | Discharge status: Home / Self Care | Payer: Medicare PPO | Source: Ambulatory Visit | Attending: Internal Medicine | Admitting: Internal Medicine

## 2021-01-23 ENCOUNTER — Other Ambulatory Visit: Payer: Self-pay

## 2021-01-23 DIAGNOSIS — M8588 Other specified disorders of bone density and structure, other site: Secondary | ICD-10-CM

## 2021-01-23 NOTE — Telephone Encounter (Signed)
Patient no longer under our care. PCP is now Social research officer, government, [employer]

## 2021-02-21 ENCOUNTER — Ambulatory Visit (INDEPENDENT_AMBULATORY_CARE_PROVIDER_SITE_OTHER): Payer: Medicare PPO | Admitting: Family

## 2021-02-21 ENCOUNTER — Other Ambulatory Visit: Payer: Self-pay

## 2021-02-21 ENCOUNTER — Encounter: Payer: Self-pay | Admitting: Family

## 2021-02-21 DIAGNOSIS — Z0000 Encounter for general adult medical examination without abnormal findings: Secondary | ICD-10-CM

## 2021-02-21 NOTE — Patient Instructions (Signed)
[name] , Thank you for taking time to come for your Medicare Wellness Visit. I appreciate your ongoing commitment to your health goals. Please review the following plan we discussed and let me know if I can assist you in the future.   Screening recommendations/referrals: Colonoscopy N/A  Mammogram:  Bone Density: Up to date  Recommended yearly ophthalmology/optometry visit for glaucoma screening and checkup Recommended yearly dental visit for hygiene and checkup  Vaccinations: Influenza vaccine : Due  Pneumococcal vaccine : Up to date  Tdap vaccine : Up to date  Shingles vaccine : Up to date     Advanced directives: No   Conditions/risks identified: Advance age female > 83 yrs,Hypertension,Obesity,Sedentary,History of smoking  Next appointment: 1 year    Preventive Care 83 Years and Older, Female Preventive care refers to lifestyle choices and visits with your health care provider that can promote health and wellness. What does preventive care include? A yearly physical exam. This is also called an annual well check. Dental exams once or twice a year. Routine eye exams. Ask your health care provider how often you should have your eyes checked. Personal lifestyle choices, including: Daily care of your teeth and gums. Regular physical activity. Eating a healthy diet. Avoiding tobacco and drug use. Limiting alcohol use. Practicing safe sex. Taking low-dose aspirin every day. Taking vitamin and mineral supplements as recommended by your health care provider. What happens during an annual well check? The services and screenings done by your health care provider during your annual well check will depend on your age, overall health, lifestyle risk factors, and family history of disease. Counseling  Your health care provider may ask you questions about your: Alcohol use. Tobacco use. Drug use. Emotional well-being. Home and relationship well-being. Sexual  activity. Eating habits. History of falls. Memory and ability to understand (cognition). Work and work Astronomer. Reproductive health. Screening  You may have the following tests or measurements: Height, weight, and BMI. Blood pressure. Lipid and cholesterol levels. These may be checked every 5 years, or more frequently if you are over 83 years old. Skin check. Lung cancer screening. You may have this screening every year starting at age 83 if you have a 30-pack-year history of smoking and currently smoke or have quit within the past 15 years. Fecal occult blood test (FOBT) of the stool. You may have this test every year starting at age 83. Flexible sigmoidoscopy or colonoscopy. You may have a sigmoidoscopy every 5 years or a colonoscopy every 10 years starting at age 83. Hepatitis C blood test. Hepatitis B blood test. Sexually transmitted disease (STD) testing. Diabetes screening. This is done by checking your blood sugar (glucose) after you have not eaten for a while (fasting). You may have this done every 1-3 years. Bone density scan. This is done to screen for osteoporosis. You may have this done starting at age 54. Mammogram. This may be done every 1-2 years. Talk to your health care provider about how often you should have regular mammograms. Talk with your health care provider about your test results, treatment options, and if necessary, the need for more tests. Vaccines  Your health care provider may recommend certain vaccines, such as: Influenza vaccine. This is recommended every year. Tetanus, diphtheria, and acellular pertussis (Tdap, Td) vaccine. You may need a Td booster every 10 years. Zoster vaccine. You may need this after age 29. Pneumococcal 13-valent conjugate (PCV13) vaccine. One dose is recommended after age 38. Pneumococcal polysaccharide (PPSV23) vaccine. One dose  is recommended after age 31. Talk to your health care provider about which screenings and vaccines  you need and how often you need them. This information is not intended to replace advice given to you by your health care provider. Make sure you discuss any questions you have with your health care provider. Document Released: 06/22/2015 Document Revised: 02/13/2016 Document Reviewed: 03/27/2015 Elsevier Interactive Patient Education  2017 [place] Prevention in the Home Falls can cause injuries. They can happen to people of all ages. There are many things you can do to make your home safe and to help prevent falls. What can I do on the outside of my home? Regularly fix the edges of walkways and driveways and fix any cracks. Remove anything that might make you trip as you walk through a door, such as a raised step or threshold. Trim any bushes or trees on the path to your home. Use bright outdoor lighting. Clear any walking paths of anything that might make someone trip, such as rocks or tools. Regularly check to see if handrails are loose or broken. Make sure that both sides of any steps have handrails. Any raised decks and porches should have guardrails on the edges. Have any leaves, snow, or ice cleared regularly. Use sand or salt on walking paths during winter. Clean up any spills in your garage right away. This includes oil or grease spills. What can I do in the bathroom? Use night lights. Install grab bars by the toilet and in the tub and shower. Do not use towel bars as grab bars. Use non-skid mats or decals in the tub or shower. If you need to sit down in the shower, use a plastic, non-slip stool. Keep the floor dry. Clean up any water that spills on the floor as soon as it happens. Remove soap buildup in the tub or shower regularly. Attach bath mats securely with double-sided non-slip rug tape. Do not have throw rugs and other things on the floor that can make you trip. What can I do in the bedroom? Use night lights. Make sure that you have a light by your bed that  is easy to reach. Do not use any sheets or blankets that are too big for your bed. They should not hang down onto the floor. Have a firm chair that has side arms. You can use this for support while you get dressed. Do not have throw rugs and other things on the floor that can make you trip. What can I do in the kitchen? Clean up any spills right away. Avoid walking on wet floors. Keep items that you use a lot in easy-to-reach places. If you need to reach something above you, use a strong step stool that has a grab bar. Keep electrical cords out of the way. Do not use floor polish or wax that makes floors slippery. If you must use wax, use non-skid floor wax. Do not have throw rugs and other things on the floor that can make you trip. What can I do with my stairs? Do not leave any items on the stairs. Make sure that there are handrails on both sides of the stairs and use them. Fix handrails that are broken or loose. Make sure that handrails are as long as the stairways. Check any carpeting to make sure that it is firmly attached to the stairs. Fix any carpet that is loose or worn. Avoid having throw rugs at the top or bottom of the stairs.  If you do have throw rugs, attach them to the floor with carpet tape. Make sure that you have a light switch at the top of the stairs and the bottom of the stairs. If you do not have them, ask someone to add them for you. What else can I do to help prevent falls? Wear shoes that: Do not have high heels. Have rubber bottoms. Are comfortable and fit you well. Are closed at the toe. Do not wear sandals. If you use a stepladder: Make sure that it is fully opened. Do not climb a closed stepladder. Make sure that both sides of the stepladder are locked into place. Ask someone to hold it for you, if possible. Clearly mark and make sure that you can see: Any grab bars or handrails. First and last steps. Where the edge of each step is. Use tools that help you  move around (mobility aids) if they are needed. These include: Canes. Walkers. Scooters. Crutches. Turn on the lights when you go into a dark area. Replace any light bulbs as soon as they burn out. Set up your furniture so you have a clear path. Avoid moving your furniture around. If any of your floors are uneven, fix them. If there are any pets around you, be aware of where they are. Review your medicines with your doctor. Some medicines can make you feel dizzy. This can increase your chance of falling. Ask your doctor what other things that you can do to help prevent falls. This information is not intended to replace advice given to you by your health care provider. Make sure you discuss any questions you have with your health care provider. Document Released: 03/22/2009 Document Revised: 11/01/2015 Document Reviewed: 06/30/2014 Elsevier Interactive Patient Education  2017 [employer].

## 2021-02-21 NOTE — Progress Notes (Signed)
This service is provided via telemedicine  No vital signs collected/recorded due to the encounter was a telemedicine visit.   Location of patient (ex: home, work):  Home.  Patient consents to a telephone visit:  Yes  Location of the provider (ex: office, home):  [employer].  Name of any referring provider:  Previn Jian, Donalee Citrin, NP   Names of all persons participating in the telemedicine service and their role in the encounter:  Patient, [name]/Daughter, Meda Klinefelter, RMA, Leasha Goldberger, NP.    Time spent on call: 8 minutes spent on the phone with Medical Assistant.     Subjective:   Carol [name] is a 85 y.o. female who presents for Medicare Annual (Subsequent) preventive examination.  Review of Systems     Cardiac Risk Factors include: advanced age (>63men, >50 women);hypertension;obesity (BMI >30kg/m2);sedentary lifestyle;smoking/ tobacco exposure     Objective:    There were no vitals filed for this visit. There is no height or weight on file to calculate BMI.  Advanced Directives 02/21/2021 11/28/2020 07/26/2020 01/26/2018 01/03/2018 01/02/2018 11/11/2017  Does Patient Have a Medical Advance Directive? No No Yes Yes - Yes Yes  Type of Advance Directive - Designer, television/film set Power of [employer] Power of [employer] Power of Attorney  Does patient want to make changes to medical advance directive? - - No - Patient declined - No - Patient declined - Yes (MAU/Ambulatory/Procedural Areas - Information given)  Copy of Healthcare Power of Attorney in Chart? - - Yes - validated most recent copy scanned in chart (See row information) No - copy requested No - copy requested No - copy requested No - copy requested  Would patient like information on creating a medical advance directive? No - Patient declined No - Patient declined - - - - -    Current Medications (verified) Outpatient Encounter  Medications as of 02/21/2021  Medication Sig   acetaminophen (TYLENOL) 325 MG tablet Take 1 tablet (325 mg total) by mouth daily as needed (pain).   alendronate (FOSAMAX) 70 MG tablet TAKE 1 TABLET(70 MG) BY MOUTH 1 TIME A WEEK WITH A FULL GLASS OF WATER AND ON AN EMPTY STOMACH   amLODipine (NORVASC) 2.5 MG tablet Take 1 tablet (2.5 mg total) by mouth 2 (two) times daily.   atorvastatin (LIPITOR) 20 MG tablet Take 1 tablet (20 mg total) by mouth daily.   Calcium Carb-Cholecalciferol (CALCIUM 600 + D PO) Take 1 tablet by mouth daily.   Cholecalciferol (VITAMIN D3 PO) Take 1,000 Units by mouth. Daily-otc   lisinopril (ZESTRIL) 5 MG tablet Take 1 tablet (5 mg total) by mouth daily. For Kidney Protection   Memantine HCl-Donepezil HCl (NAMZARIC) 28-10 MG CP24 Take 1 capsule by mouth daily.   Multiple Vitamins-Minerals (MULTIVITAMIN WITH MINERALS) tablet Take 1 tablet by mouth daily with supper. Centrum Silver   nystatin (MYCOSTATIN/NYSTOP) powder Apply topically 2 (two) times daily. To groin and abdomen area   polyethylene glycol (MIRALAX / GLYCOLAX) packet Take 17 g by mouth daily.   Probiotic Product (PROBIOTIC DAILY PO) Take by mouth. Culturelle--otc   QUEtiapine (SEROQUEL) 100 MG tablet Take 1 tablet (100 mg total) by mouth 2 (two) times daily.   senna-docusate (SENOKOT-S) 8.6-50 MG tablet Take 1 tablet by mouth at bedtime.   No facility-administered encounter medications on file as of 02/21/2021.    Allergies (verified) Penicillins   History: Past Medical History:  Diagnosis Date   Anxiety  state, unspecified    Cataract    Depressive disorder, not elsewhere classified    Lumbago    Memory loss    Other and unspecified hyperlipidemia    Rickets, late effect    Tobacco use disorder    Unspecified constipation    Unspecified essential hypertension    Varicose veins of lower extremities    Past Surgical History:  Procedure Laterality Date   CYST EXCISION Bilateral    behind ears    MOUTH SURGERY  04/12/2014   Family History  Problem Relation Age of Onset   Heart disease Father    Social History   Socioeconomic History   Marital status: Married    Spouse name: Not on file   Number of children: Not on file   Years of education: Not on file   Highest education level: Not on file  Occupational History   Not on file  Tobacco Use   Smoking status: Former    Years: 55.00    Types: Cigarettes    Quit date: 05/09/2017    Years since quitting: 3.7   Smokeless tobacco: Never  Vaping Use   Vaping Use: Never used  Substance and Sexual Activity   Alcohol use: No    Alcohol/week: 0.0 standard drinks   Drug use: No   Sexual activity: Not on file  Other Topics Concern   Not on file  Social History Narrative   Not on file   Social Determinants of Health   Financial Resource Strain: Not on file  Food Insecurity: Not on file  Transportation Needs: Not on file  Physical Activity: Not on file  Stress: Not on file  Social Connections: Not on file    Tobacco Counseling Counseling given: Not Answered   Clinical Intake:  Pre-visit preparation completed: No  Pain : No/denies pain     BMI - recorded: 32.58 Nutritional Status: BMI > 30  Obese Nutritional Risks: None Diabetes: No  How often do you need to have someone help you when you read instructions, pamphlets, or other written materials from your doctor or pharmacy?: 4 - Often What is the last grade level you completed in school?: 2 year of college  Diabetic?No   Interpreter Needed?: No  Information entered by :: Tamakia Porto,/FNP - C   Activities of Daily Living In your present state of health, do you have any difficulty performing the following activities: 02/21/2021  Hearing? N  Vision? N  Difficulty concentrating or making decisions? Y  Comment remembering  Walking or climbing stairs? Y  Comment walks with walker  Dressing or bathing? N  Doing errands, shopping? Y  Preparing Food and  eating ? N  Using the Toilet? N  In the past six months, have you accidently leaked urine? Y  Comment wears depends  Do you have problems with loss of bowel control? N  Managing your Medications? Y  Comment daughter assist  Managing your Finances? N  Housekeeping or managing your Housekeeping? Y  Comment family assist  Some recent data might be hidden    Patient Care Team: Deaundre Allston, Donalee Citrin, NP as PCP - General (Family Medicine)  Indicate any recent Medical Services you may have received from other than [facility] providers in the past year (date may be approximate).     Assessment:   This is a routine wellness examination for Wayne.  Hearing/Vision screen Hearing Screening - Comments:: No Hearing Concerns. Patient doesn't wear hearing aids.  Vision Screening - Comments:: No Vision  Concerns. Patient wears prescription glasses. Patient last eye exam was 02/12/2021  Dietary issues and exercise activities discussed: Current Exercise Habits: The patient does not participate in regular exercise at present, Exercise limited by: None identified   Goals Addressed             This Visit's Progress    Community involvement   Not on track    Pt would like to get involved in the community      Patient Stated       Live long and do the best she can        Depression Screen PHQ 2/9 Scores 02/21/2021 07/26/2020 10/26/2019 10/26/2019 02/23/2019 07/27/2018 04/26/2018  PHQ - 2 Score 0 0 0 0 - 0 0  PHQ- 9 Score - - 3 - - - -  Exception Documentation - - - - Other- indicate reason in comment box - -  Not completed - - - - unable to assess due to dementia - -    Fall Risk Fall Risk  02/21/2021 11/28/2020 07/26/2020 10/26/2019 02/23/2019  Falls in the past year? 0 0 0 0 0  Number falls in past yr: 0 0 0 0 0  Injury with Fall? 0 0 0 0 0  Comment - - - - -  Risk for fall due to : No Fall Risks No Fall Risks - - History of fall(s);Impaired balance/gait;Mental status change  Follow up Falls  evaluation completed - - - Falls evaluation completed;Falls prevention discussed    FALL RISK PREVENTION PERTAINING TO THE HOME:  Any stairs in or around the home? No  If so, are there any without handrails? No  Home free of loose throw rugs in walkways, pet beds, electrical cords, etc? No  Adequate lighting in your home to reduce risk of falls? Yes   ASSISTIVE DEVICES UTILIZED TO PREVENT FALLS:  Life alert? No  Use of a cane, walker or w/c? Yes  Grab bars in the bathroom? Yes  Shower chair or bench in shower? Yes  Elevated toilet seat or a handicapped toilet? Yes   TIMED UP AND GO:  Was the test performed? No .  Length of time to ambulate 10 feet: N/A  sec.   Gait slow and steady with assistive device  Cognitive Function: MMSE - Mini Mental State Exam 02/23/2019 11/11/2017 10/29/2016 12/15/2014 12/14/2013  Orientation to time 1 0 0 1 3  Orientation to Place 4 3 3 4 5   Registration 3 3 3 3 3   Attention/ Calculation 5 5 5 5 5   Recall 0 0 0 2 0  Language- name 2 objects 2 2 2 2 2   Language- repeat 1 1 1 1 1   Language- follow 3 step command 3 3 3 3 3   Language- read & follow direction 1 1 1 1 1   Write a sentence 1 1 1 1 1   Copy design 1 1 0 1 1  Total score 22 20 19 24 25      6CIT Screen 02/21/2021  What Year? 4 points  What month? 3 points  What time? 0 points  Count back from 20 0 points  Months in reverse 4 points  Repeat phrase 10 points  Total Score 21    Immunizations Immunization History  Administered Date(s) Administered   Fluad Quad(high Dose 65+) 02/23/2019, 04/25/2020   Influenza Whole 04/06/2012   Influenza,inj,Quad PF,6+ Mos 03/01/2013, 06/08/2014, 02/16/2015, 02/01/2016, 03/03/2017, 03/24/2018   PFIZER(Purple Top)SARS-COV-2 Vaccination 07/14/2019, 08/04/2019, 05/10/2020   Pneumococcal Conjugate-13 12/15/2014  Pneumococcal Polysaccharide-23 07/10/2011   Td 07/10/2011   Tdap 06/08/2014   Zoster Recombinat (Shingrix) 11/19/2017, 04/12/2018, 05/10/2018,  05/12/2018    TDAP status: Up to date  Flu Vaccine status: Due, Education has been provided regarding the importance of this vaccine. Advised may receive this vaccine at local pharmacy or Health Dept. Aware to provide a copy of the vaccination record if obtained from local pharmacy or Health Dept. Verbalized acceptance and understanding.  Pneumococcal vaccine status: Up to date  Covid-19 vaccine status: Information provided on how to obtain vaccines.   Qualifies for Shingles Vaccine? Yes   Zostavax completed Yes   Shingrix Completed?: Yes  Screening Tests Health Maintenance  Topic Date Due   FOOT EXAM  Never done   OPHTHALMOLOGY EXAM  Never done   COVID-19 Vaccine (4 - Booster for Pfizer series) 09/08/2020   INFLUENZA VACCINE  01/07/2021   HEMOGLOBIN A1C  06/07/2021   TETANUS/TDAP  06/08/2024   DEXA SCAN  Completed   PNA vac Low Risk Adult  Completed   Zoster Vaccines- Shingrix  Completed   HPV VACCINES  Aged Out    Health Maintenance  Health Maintenance Due  Topic Date Due   FOOT EXAM  Never done   OPHTHALMOLOGY EXAM  Never done   COVID-19 Vaccine (4 - Booster for Pfizer series) 09/08/2020   INFLUENZA VACCINE  01/07/2021    Colorectal cancer screening: No longer required.   Mammogram status: No longer required due to age.  Bone Density status: Completed 01/23/2021. Results reflect: Bone density results: OSTEOPENIA. Repeat every 2 years.  Lung Cancer Screening: (Low Dose CT Chest recommended if Age 2-80 years, 30 pack-year currently smoking OR have quit w/in 15years.) does not qualify.   Lung Cancer Screening Referral: no   Additional Screening:  Hepatitis C Screening: does not qualify; Completed yes   Vision Screening: Recommended annual ophthalmology exams for early detection of glaucoma and other disorders of the eye. Is the patient up to date with their annual eye exam?  Yes  Who is the provider or what is the name of the office in which the patient attends  annual eye exams? Dr.Shapiro  If pt is not established with a provider, would they like to be referred to a provider to establish care? No .   Dental Screening: Recommended annual dental exams for proper oral hygiene  Community Resource Referral / Chronic Care Management: CRR required this visit?  No   CCM required this visit?  No     Plan:     I have personally reviewed and noted the following in the patient's chart:   Medical and social history Use of alcohol, tobacco or illicit drugs  Current medications and supplements including opioid prescriptions.  Functional ability and status Nutritional status Physical activity Advanced directives List of other physicians Hospitalizations, surgeries, and ER visits in previous 12 months Vitals Screenings to include cognitive, depression, and falls Referrals and appointments  In addition, I have reviewed and discussed with patient certain preventive protocols, quality metrics, and best practice recommendations. A written personalized care plan for preventive services as well as general preventive health recommendations were provided to patient.     Caesar Bookman, NP   02/21/2021   Nurse Notes: she will get her Influenza and 2 nd COVID-19 booster vaccine at her Pharmacy.

## 2021-03-14 ENCOUNTER — Ambulatory Visit (INDEPENDENT_AMBULATORY_CARE_PROVIDER_SITE_OTHER): Payer: Medicare PPO

## 2021-03-14 ENCOUNTER — Other Ambulatory Visit: Payer: Self-pay

## 2021-03-14 DIAGNOSIS — Z23 Encounter for immunization: Secondary | ICD-10-CM | POA: Diagnosis not present

## 2021-05-30 ENCOUNTER — Ambulatory Visit: Payer: TRICARE For Life (TFL) | Admitting: Family

## 2021-06-05 ENCOUNTER — Other Ambulatory Visit: Payer: Self-pay | Admitting: Family

## 2021-08-20 ENCOUNTER — Other Ambulatory Visit: Payer: Self-pay | Admitting: *Deleted

## 2021-08-20 ENCOUNTER — Other Ambulatory Visit: Payer: Self-pay | Admitting: Family

## 2021-08-20 DIAGNOSIS — M8588 Other specified disorders of bone density and structure, other site: Secondary | ICD-10-CM

## 2021-08-20 DIAGNOSIS — F0150 Vascular dementia without behavioral disturbance: Secondary | ICD-10-CM

## 2021-08-20 MED ORDER — NAMZARIC 28-10 MG PO CP24: 1.0000 | ORAL_CAPSULE | Freq: Every day | ORAL | 0 refills | Status: DC

## 2021-08-20 MED ORDER — ALENDRONATE SODIUM 70 MG PO TABS: ORAL_TABLET | ORAL | 0 refills | Status: DC

## 2021-08-20 NOTE — Telephone Encounter (Signed)
Pharmacy requested refills.  ?Patient Needs an appointment before anymore future refills.  ?

## 2021-08-20 NOTE — Addendum Note (Signed)
Addended by: Nelda Severe A on: 08/20/2021 04:35 PM ? ? Modules accepted: Orders ? ?

## 2021-08-26 ENCOUNTER — Other Ambulatory Visit: Payer: Self-pay

## 2021-08-26 DIAGNOSIS — I10 Essential (primary) hypertension: Secondary | ICD-10-CM

## 2021-08-26 MED ORDER — AMLODIPINE BESYLATE 2.5 MG PO TABS: 2.5000 mg | ORAL_TABLET | Freq: Two times a day (BID) | ORAL | 1 refills | Status: DC

## 2021-08-26 NOTE — Telephone Encounter (Signed)
[facility] faxed refill request for medication ? ?High dose warning came up when trying to send for refill. Medication pended and sent to Richarda Blade, NP  ?

## 2021-08-30 ENCOUNTER — Other Ambulatory Visit: Payer: Self-pay | Admitting: Nurse Practitioner

## 2021-08-30 DIAGNOSIS — E782 Mixed hyperlipidemia: Secondary | ICD-10-CM

## 2021-09-04 ENCOUNTER — Ambulatory Visit (INDEPENDENT_AMBULATORY_CARE_PROVIDER_SITE_OTHER): Payer: Medicare PPO | Admitting: Family

## 2021-09-04 VITALS — BP 112/70 | Temp 97.2°F | Ht 64.0 in | Wt 183.8 lb

## 2021-09-04 DIAGNOSIS — R42 Dizziness and giddiness: Secondary | ICD-10-CM

## 2021-09-04 DIAGNOSIS — I10 Essential (primary) hypertension: Secondary | ICD-10-CM

## 2021-09-04 LAB — CBC WITH DIFFERENTIAL/PLATELET
Absolute Monocytes: 529 cells/uL (ref 200–950)
Basophils Absolute: 50 cells/uL (ref 0–200)
Basophils Relative: 0.6 %
Eosinophils Absolute: 244 cells/uL (ref 15–500)
Eosinophils Relative: 2.9 %
HCT: 42.9 % (ref 35.0–45.0)
Hemoglobin: 14 g/dL (ref 11.7–15.5)
Lymphs Abs: 3469 cells/uL (ref 850–3900)
MCH: 28.8 pg (ref 27.0–33.0)
MCHC: 32.6 g/dL (ref 32.0–36.0)
MCV: 88.3 fL (ref 80.0–100.0)
MPV: 12.6 fL — ABNORMAL HIGH (ref 7.5–12.5)
Monocytes Relative: 6.3 %
Neutro Abs: 4108 cells/uL (ref 1500–7800)
Neutrophils Relative %: 48.9 %
Platelets: 204 10*3/uL (ref 140–400)
RBC: 4.86 10*6/uL (ref 3.80–5.10)
RDW: 13 % (ref 11.0–15.0)
Total Lymphocyte: 41.3 %
WBC: 8.4 10*3/uL (ref 3.8–10.8)

## 2021-09-04 MED ORDER — AMLODIPINE BESYLATE 2.5 MG PO TABS: 2.5000 mg | ORAL_TABLET | Freq: Every day | ORAL | 1 refills | Status: DC

## 2021-09-04 NOTE — Patient Instructions (Signed)
-   reduce Amlodipine 2.5 mg tablet from twice daily to one by mouth once daily  ? ?- Increase water intake to 6-8 glasses daily  ? ?_ Notify provider if symptoms or fail to improve  ?

## 2021-09-04 NOTE — Progress Notes (Signed)
? ?Provider: Richarda Bladeinah Ishaan Villamar FNP-C ? ?Makinzy Cleere, Donalee Citrininah C, NP ? ?Patient Care Team: ?Rosellen Lichtenberger, Donalee Citrininah C, NP as PCP - General (Family Medicine) ? ?Extended Emergency Contact Information ?Primary Emergency Contact: Brown,[name] ? [place] ?Mobile Phone: [phone] ?Relation: Daughter ?Secondary Emergency Contact: Pinnix,Teressa ? [place] ?Mobile Phone: [phone] ?Relation: Daughter ? ?Code Status: Full Code  ?Goals of care: Advanced Directive information ? ?  02/21/2021  ?  4:11 PM  ?Advanced Directives  ?Does Patient Have a Medical Advance Directive? No  ?Would patient like information on creating a medical advance directive? No - Patient declined  ? ? ? ?Chief Complaint  ?Patient presents with  ? Acute Visit  ?  Patient c/o light-headedness and dizziness   ? ? ?HPI:  ?Pt is a 86 y.o. female seen today for an acute visit for evaluation of lightheaded and dizziness.Daughter states when patient went to the bathroom in the morning she complained of room spinning. Her symptom does not worsen with positional change.  She denies any fever,chills,cough,fatigue,body aches,runny nose,chest tightness,chest pain,palpitation or shortness of breath. Also denies any symptoms of urinary tract infection. ?No home blood pressure readings for evaluation ?Appetite has been good. ? ? ?Past Medical History:  ?Diagnosis Date  ? Anxiety state, unspecified   ? Cataract   ? Depressive disorder, not elsewhere classified   ? Lumbago   ? Memory loss   ? Other and unspecified hyperlipidemia   ? Rickets, late effect   ? Tobacco use disorder   ? Unspecified constipation   ? Unspecified essential hypertension   ? Varicose veins of lower extremities   ? ?Past Surgical History:  ?Procedure Laterality Date  ? CYST EXCISION Bilateral   ? behind ears  ? MOUTH SURGERY  04/12/2014  ? ? ?Allergies  ?Allergen Reactions  ? Penicillins Swelling and Rash  ?  Tolerate ceftriaxone  ? ? ?Outpatient Encounter Medications as of 09/04/2021   ?Medication Sig  ? acetaminophen (TYLENOL) 325 MG tablet Take 1 tablet (325 mg total) by mouth daily as needed (pain).  ? alendronate (FOSAMAX) 70 MG tablet TAKE 1 TABLET(70 MG) BY MOUTH 1 TIME A WEEK WITH A FULL GLASS OF WATER AND ON AN EMPTY STOMACH. Needs an appointment before anymore future refills.  ? amLODipine (NORVASC) 2.5 MG tablet Take 1 tablet (2.5 mg total) by mouth 2 (two) times daily.  ? atorvastatin (LIPITOR) 20 MG tablet TAKE 1 TABLET(20 MG) BY MOUTH DAILY  ? Calcium Carb-Cholecalciferol (CALCIUM 600 + D PO) Take 1 tablet by mouth daily.  ? lisinopril (ZESTRIL) 5 MG tablet TAKE 1 TABLET(5 MG) BY MOUTH DAILY FOR KIDNEY PROTECTION  ? Memantine HCl-Donepezil HCl (NAMZARIC) 28-10 MG CP24 Take 1 capsule by mouth daily. Needs an appointment before anymore future Refills.  ? Multiple Vitamins-Minerals (MULTIVITAMIN WITH MINERALS) tablet Take 1 tablet by mouth daily with supper. Centrum Silver  ? nystatin (MYCOSTATIN/NYSTOP) powder Apply topically 2 (two) times daily. To groin and abdomen area  ? polyethylene glycol (MIRALAX / GLYCOLAX) packet Take 17 g by mouth daily.  ? Probiotic Product (PROBIOTIC DAILY PO) Take by mouth. Culturelle--otc  ? QUEtiapine (SEROQUEL) 100 MG tablet Take 1 tablet (100 mg total) by mouth 2 (two) times daily.  ? senna-docusate (SENOKOT-S) 8.6-50 MG tablet Take 1 tablet by mouth at bedtime.  ? Cholecalciferol (VITAMIN D3 PO) Take 1,000 Units by mouth. Daily-otc  ? ?No facility-administered encounter medications on file as of 09/04/2021.  ? ? ?Review of Systems  ?Constitutional:  Negative for appetite change, chills, fatigue, fever and unexpected weight change.  ?HENT:  Negative for congestion, dental problem, ear discharge, ear pain, facial swelling, hearing loss, nosebleeds, postnasal drip, rhinorrhea, sinus pressure, sinus pain, sneezing, sore throat, tinnitus and trouble swallowing.   ?Eyes:  Negative for pain, discharge, redness, itching and visual disturbance.  ?Respiratory:   Negative for cough, chest tightness, shortness of breath and wheezing.   ?Cardiovascular:  Negative for chest pain, palpitations and leg swelling.  ?Gastrointestinal:  Negative for abdominal distention, abdominal pain, blood in stool, constipation, diarrhea, nausea and vomiting.  ?Endocrine: Negative for cold intolerance, heat intolerance, polydipsia, polyphagia and polyuria.  ?Genitourinary:  Negative for difficulty urinating, dysuria, flank pain, frequency and urgency.  ?Musculoskeletal:  Negative for arthralgias, back pain, gait problem, joint swelling, myalgias, neck pain and neck stiffness.  ?Skin:  Negative for color change, pallor, rash and wound.  ?Neurological:  Positive for dizziness and light-headedness. Negative for syncope, speech difficulty, weakness, numbness and headaches.  ?Hematological:  Does not bruise/bleed easily.  ?Psychiatric/Behavioral:  Negative for agitation, behavioral problems, confusion, hallucinations and sleep disturbance. The patient is not nervous/anxious.   ?     Memory loss  ? ?Immunization History  ?Administered Date(s) Administered  ? Fluad Quad(high Dose 65+) 02/23/2019, 04/25/2020, 03/14/2021  ? Influenza Whole 04/06/2012  ? Influenza,inj,Quad PF,6+ Mos 03/01/2013, 06/08/2014, 02/16/2015, 02/01/2016, 03/03/2017, 03/24/2018  ? PFIZER(Purple Top)SARS-COV-2 Vaccination 07/14/2019, 08/04/2019, 05/10/2020  ? Research officer, [employer] 7yrs & up 06/13/2021  ? Pneumococcal Conjugate-13 12/15/2014  ? Pneumococcal Polysaccharide-23 07/10/2011  ? Td 07/10/2011  ? Tdap 06/08/2014  ? Zoster Recombinat (Shingrix) 11/19/2017, 04/12/2018, 05/10/2018, 05/10/2018, 05/12/2018  ? ?Pertinent  Health Maintenance Due  ?Topic Date Due  ? FOOT EXAM  Never done  ? OPHTHALMOLOGY EXAM  Never done  ? HEMOGLOBIN A1C  06/07/2021  ? INFLUENZA VACCINE  Completed  ? DEXA SCAN  Completed  ? ? ?  02/23/2019  ?  3:30 PM 10/26/2019  ? 10:33 AM 07/26/2020  ?  1:53 PM 11/28/2020  ?  3:06 PM 02/21/2021  ?   4:06 PM  ?Fall Risk  ?Falls in the past year? 0 0 0 0 0  ?Was there an injury with Fall? 0 0 0 0 0  ?Fall Risk Category Calculator 0 0 0 0 0  ?Fall Risk Category Low Low Low Low Low  ?Patient Fall Risk Level Moderate fall risk Low fall risk  Low fall risk Low fall risk  ?Patient at Risk for Falls Due to History of fall(s);Impaired balance/gait;Mental status change   No Fall Risks No Fall Risks  ?Fall risk Follow up Falls evaluation completed;Falls prevention discussed    Falls evaluation completed  ? ?Functional Status Survey: ?  ? ?Vitals:  ? 09/04/21 1518  ?Temp: (!) 97.2 ?F (36.2 ?C)  ?Weight: 183 lb 12.8 oz (83.4 kg)  ?Height: 5\' 4"  (1.626 m)  ? ?Body mass index is 31.55 kg/m? ?Physical Exam ?Vitals reviewed.  ?Constitutional:   ?   General: She is not in acute distress. ?   Appearance: Normal appearance. She is obese. She is not ill-appearing or diaphoretic.  ?HENT:  ?   Head: Normocephalic.  ?   Right Ear: Tympanic membrane, ear canal and external ear normal. There is no impacted cerumen.  ?   Left Ear: Tympanic membrane, ear canal and external ear normal. There is no impacted cerumen.  ?   Nose: Nose normal. No congestion or rhinorrhea.  ?   Mouth/Throat:  ?  Mouth: Mucous membranes are moist.  ?   Pharynx: Oropharynx is clear. No oropharyngeal exudate or posterior oropharyngeal erythema.  ?Eyes:  ?   General: No scleral icterus.    ?   Right eye: No discharge.     ?   Left eye: No discharge.  ?   Extraocular Movements: Extraocular movements intact.  ?   Conjunctiva/sclera: Conjunctivae normal.  ?   Pupils: Pupils are equal, round, and reactive to light.  ?Neck:  ?   Vascular: No carotid bruit.  ?Cardiovascular:  ?   Rate and Rhythm: Normal rate and regular rhythm.  ?   Pulses: Normal pulses.  ?   Heart sounds: Normal heart sounds. No murmur heard. ?  No friction rub. No gallop.  ?Pulmonary:  ?   Effort: Pulmonary effort is normal. No respiratory distress.  ?   Breath sounds: Normal breath sounds. No  wheezing, rhonchi or rales.  ?Chest:  ?   Chest wall: No tenderness.  ?Abdominal:  ?   General: Bowel sounds are normal. There is no distension.  ?   Palpations: Abdomen is soft. There is no mass.  ?   Te

## 2021-09-18 ENCOUNTER — Other Ambulatory Visit: Payer: Self-pay | Admitting: Family

## 2021-09-18 DIAGNOSIS — F0280 Dementia in other diseases classified elsewhere without behavioral disturbance: Secondary | ICD-10-CM

## 2021-10-01 ENCOUNTER — Other Ambulatory Visit: Payer: Self-pay | Admitting: Family

## 2021-10-01 DIAGNOSIS — M8588 Other specified disorders of bone density and structure, other site: Secondary | ICD-10-CM

## 2021-10-03 ENCOUNTER — Other Ambulatory Visit: Payer: Self-pay | Admitting: Family

## 2021-10-03 DIAGNOSIS — E782 Mixed hyperlipidemia: Secondary | ICD-10-CM

## 2021-10-03 DIAGNOSIS — I10 Essential (primary) hypertension: Secondary | ICD-10-CM

## 2021-10-03 DIAGNOSIS — E119 Type 2 diabetes mellitus without complications: Secondary | ICD-10-CM

## 2021-10-04 ENCOUNTER — Other Ambulatory Visit: Payer: Medicare PPO

## 2021-10-07 ENCOUNTER — Other Ambulatory Visit: Payer: Medicare PPO

## 2021-10-07 DIAGNOSIS — E782 Mixed hyperlipidemia: Secondary | ICD-10-CM

## 2021-10-07 DIAGNOSIS — I10 Essential (primary) hypertension: Secondary | ICD-10-CM

## 2021-10-07 DIAGNOSIS — E119 Type 2 diabetes mellitus without complications: Secondary | ICD-10-CM

## 2021-10-08 ENCOUNTER — Ambulatory Visit: Payer: Medicare PPO | Admitting: Family

## 2021-10-09 ENCOUNTER — Ambulatory Visit: Payer: Medicare PPO | Admitting: Family

## 2021-10-09 ENCOUNTER — Other Ambulatory Visit: Payer: Medicare PPO

## 2021-10-09 ENCOUNTER — Encounter: Payer: Self-pay | Admitting: Family

## 2021-10-09 ENCOUNTER — Ambulatory Visit (INDEPENDENT_AMBULATORY_CARE_PROVIDER_SITE_OTHER): Payer: Medicare PPO | Admitting: Family

## 2021-10-09 VITALS — BP 120/80 | HR 84 | Temp 97.3°F | Ht 64.0 in

## 2021-10-09 DIAGNOSIS — H6123 Impacted cerumen, bilateral: Secondary | ICD-10-CM

## 2021-10-09 DIAGNOSIS — B372 Candidiasis of skin and nail: Secondary | ICD-10-CM

## 2021-10-09 DIAGNOSIS — G309 Alzheimer's disease, unspecified: Secondary | ICD-10-CM

## 2021-10-09 DIAGNOSIS — F0280 Dementia in other diseases classified elsewhere without behavioral disturbance: Secondary | ICD-10-CM

## 2021-10-09 DIAGNOSIS — E119 Type 2 diabetes mellitus without complications: Secondary | ICD-10-CM | POA: Diagnosis not present

## 2021-10-09 DIAGNOSIS — F0150 Vascular dementia without behavioral disturbance: Secondary | ICD-10-CM

## 2021-10-09 DIAGNOSIS — I10 Essential (primary) hypertension: Secondary | ICD-10-CM | POA: Diagnosis not present

## 2021-10-09 DIAGNOSIS — E782 Mixed hyperlipidemia: Secondary | ICD-10-CM

## 2021-10-09 MED ORDER — NYSTATIN 100000 UNIT/GM EX CREA: 1.0000 "application " | TOPICAL_CREAM | Freq: Two times a day (BID) | CUTANEOUS | 0 refills | Status: DC

## 2021-10-09 MED ORDER — DEBROX 6.5 % OT SOLN: 5.0000 [drp] | Freq: Two times a day (BID) | OTIC | 0 refills | Status: AC

## 2021-10-09 NOTE — Patient Instructions (Signed)
-   Instill debrox 6.5 otic solution 5 drops into each ear twice daily x 4 days then follow up for ear lavage.May apply cotton ball at bedtime to prevent drainage to pillow.  

## 2021-10-09 NOTE — Progress Notes (Signed)
? ?Provider: Richarda Blade FNP-C ? ?Carol [name], Carol Citrin, NP ? ?Patient Care Team: ?Carol [name], Carol Citrin, NP as PCP - General (Family Medicine) ? ?Extended Emergency Contact Information ?Primary Emergency Contact: Brown,[name] ? [place] ?Mobile Phone: [phone] ?Relation: Daughter ?Secondary Emergency Contact: Pinnix,Teressa ? [place] ?Mobile Phone: [phone] ?Relation: Daughter ? ?Code Status:  Full Code  ?Goals of care: Advanced Directive information ? ?  02/21/2021  ?  4:11 PM  ?Advanced Directives  ?Does Patient Have a Medical Advance Directive? No  ?Would patient like information on creating a medical advance directive? No - Patient declined  ? ? ? ?Chief Complaint  ?Patient presents with  ? Follow-up  ?  1 month follow up. Patient has had a slight cough and some phlegm. Patient doesn't have much of an appetite at times..  ? ? ?HPI:  ?Pt is a 86 y.o. female seen today for routine visit follow up for medical management of chronic issues.she denies any acute issues. ?She was here one month ago with complains of dizziness.Her amlodipine was decreased from 5 mg to 2.5 mg tablet daily.states dizziness has resolved ?Daughter states appetite not good in the evenings but eats well in the mornings. ?Her daughters takes shifts in caring for her. ?No fall episode. ? ? ?Past Medical History:  ?Diagnosis Date  ? Anxiety state, unspecified   ? Cataract   ? Depressive disorder, not elsewhere classified   ? Lumbago   ? Memory loss   ? Other and unspecified hyperlipidemia   ? Rickets, late effect   ? Tobacco use disorder   ? Unspecified constipation   ? Unspecified essential hypertension   ? Varicose veins of lower extremities   ? ?Past Surgical History:  ?Procedure Laterality Date  ? CYST EXCISION Bilateral   ? behind ears  ? MOUTH SURGERY  04/12/2014  ? ? ?Allergies  ?Allergen Reactions  ? Penicillins Swelling and Rash  ?  Tolerate ceftriaxone  ? ? ?Outpatient Encounter Medications as of 10/09/2021   ?Medication Sig  ? acetaminophen (TYLENOL) 325 MG tablet Take 1 tablet (325 mg total) by mouth daily as needed (pain).  ? alendronate (FOSAMAX) 70 MG tablet TAKE 1 TABLET(70 MG) BY MOUTH 1 TIME A WEEK WITH A FULL GLASS OF WATER AND ON AN EMPTY STOMACH  ? amLODipine (NORVASC) 2.5 MG tablet Take 1 tablet (2.5 mg total) by mouth daily.  ? atorvastatin (LIPITOR) 20 MG tablet TAKE 1 TABLET(20 MG) BY MOUTH DAILY  ? Calcium Carb-Cholecalciferol (CALCIUM 600 + D PO) Take 1 tablet by mouth daily.  ? Cholecalciferol (VITAMIN D3 PO) Take 1,000 Units by mouth. Daily-otc  ? lisinopril (ZESTRIL) 5 MG tablet TAKE 1 TABLET(5 MG) BY MOUTH DAILY FOR KIDNEY PROTECTION  ? Memantine HCl-Donepezil HCl (NAMZARIC) 28-10 MG CP24 TAKE 1 CAPSULE BY MOUTH DAILY  ? Multiple Vitamins-Minerals (MULTIVITAMIN WITH MINERALS) tablet Take 1 tablet by mouth daily with supper. Centrum Silver  ? nystatin (MYCOSTATIN/NYSTOP) powder Apply topically 2 (two) times daily. To groin and abdomen area  ? polyethylene glycol (MIRALAX / GLYCOLAX) packet Take 17 g by mouth daily.  ? Probiotic Product (PROBIOTIC DAILY PO) Take by mouth. Culturelle--otc  ? QUEtiapine (SEROQUEL) 100 MG tablet Take 1 tablet (100 mg total) by mouth 2 (two) times daily.  ? senna-docusate (SENOKOT-S) 8.6-50 MG tablet Take 1 tablet by mouth at bedtime.  ? ?No facility-administered encounter medications on file as of 10/09/2021.  ? ? ?Review of Systems  ?Unable to perform ROS:  Dementia (additional information provided by daughter)  ?Constitutional:  Negative for appetite change, chills, fatigue, fever and unexpected weight change.  ?HENT:  Negative for congestion, dental problem, ear discharge, ear pain, facial swelling, hearing loss, nosebleeds, postnasal drip, rhinorrhea, sinus pressure, sinus pain, sneezing, sore throat, tinnitus and trouble swallowing.   ?Eyes:  Negative for pain, discharge, redness, itching and visual disturbance.  ?Respiratory:  Negative for cough, chest tightness,  shortness of breath and wheezing.   ?Cardiovascular:  Negative for chest pain, palpitations and leg swelling.  ?Gastrointestinal:  Negative for abdominal distention, abdominal pain, blood in stool, constipation, diarrhea, nausea and vomiting.  ?Endocrine: Negative for cold intolerance, heat intolerance, polydipsia, polyphagia and polyuria.  ?Genitourinary:   ?     Incontinent   ?Musculoskeletal:  Positive for gait problem. Negative for arthralgias, back pain, joint swelling, myalgias, neck pain and neck stiffness.  ?Skin:  Positive for rash. Negative for color change, pallor and wound.  ?     Yeast infection on groin   ?Neurological:  Negative for dizziness, syncope, speech difficulty, weakness, light-headedness, numbness and headaches.  ?Hematological:  Does not bruise/bleed easily.  ?Psychiatric/Behavioral:  Negative for agitation, behavioral problems, hallucinations, self-injury, sleep disturbance and suicidal ideas. The patient is not nervous/anxious.   ?     Memory loss   ? ?Immunization History  ?Administered Date(s) Administered  ? Fluad Quad(high Dose 65+) 02/23/2019, 04/25/2020, 03/14/2021  ? Influenza Whole 04/06/2012  ? Influenza,inj,Quad PF,6+ Mos 03/01/2013, 06/08/2014, 02/16/2015, 02/01/2016, 03/03/2017, 03/24/2018  ? PFIZER(Purple Top)SARS-COV-2 Vaccination 07/14/2019, 08/04/2019, 05/10/2020  ? Research officer, [employer] 55yrs & up 06/13/2021  ? Pneumococcal Conjugate-13 12/15/2014  ? Pneumococcal Polysaccharide-23 07/10/2011  ? Td 07/10/2011  ? Tdap 06/08/2014  ? Zoster Recombinat (Shingrix) 11/19/2017, 04/12/2018, 05/10/2018, 05/10/2018, 05/12/2018  ? ?Pertinent  Health Maintenance Due  ?Topic Date Due  ? FOOT EXAM  Never done  ? OPHTHALMOLOGY EXAM  Never done  ? HEMOGLOBIN A1C  06/07/2021  ? INFLUENZA VACCINE  01/07/2022  ? DEXA SCAN  Completed  ? ? ?  10/26/2019  ? 10:33 AM 07/26/2020  ?  1:53 PM 11/28/2020  ?  3:06 PM 02/21/2021  ?  4:06 PM 10/09/2021  ?  9:30 AM  ?Fall Risk  ?Falls in  the past year? 0 0 0 0 0  ?Was there an injury with Fall? 0 0 0 0 0  ?Fall Risk Category Calculator 0 0 0 0 0  ?Fall Risk Category Low Low Low Low Low  ?Patient Fall Risk Level Low fall risk  Low fall risk Low fall risk Low fall risk  ?Patient at Risk for Falls Due to   No Fall Risks No Fall Risks No Fall Risks  ?Fall risk Follow up    Falls evaluation completed Falls evaluation completed  ? ?Functional Status Survey: ?  ? ?Vitals:  ? 10/09/21 0931  ?BP: 120/80  ?Pulse: 84  ?Temp: (!) 97.3 ?F (36.3 ?C)  ?SpO2: 96%  ?Height: 5\' 4"  (1.626 m)  ? ?Body mass index is 31.55 kg/m? ?Physical Exam ?Vitals reviewed.  ?Constitutional:   ?   General: She is not in acute distress. ?   Appearance: Normal appearance. She is normal weight. She is not ill-appearing or diaphoretic.  ?HENT:  ?   Head: Normocephalic.  ?   Right Ear: There is impacted cerumen.  ?   Left Ear: There is impacted cerumen.  ?   Nose: Nose normal. No congestion or rhinorrhea.  ?   Mouth/Throat:  ?  Mouth: Mucous membranes are moist.  ?   Pharynx: Oropharynx is clear. No oropharyngeal exudate or posterior oropharyngeal erythema.  ?Eyes:  ?   General: No scleral icterus.    ?   Right eye: No discharge.     ?   Left eye: No discharge.  ?   Extraocular Movements: Extraocular movements intact.  ?   Conjunctiva/sclera: Conjunctivae normal.  ?   Pupils: Pupils are equal, round, and reactive to light.  ?Neck:  ?   Vascular: No carotid bruit.  ?Cardiovascular:  ?   Rate and Rhythm: Normal rate and regular rhythm.  ?   Pulses: Normal pulses.  ?   Heart sounds: Normal heart sounds. No murmur heard. ?  No friction rub. No gallop.  ?Pulmonary:  ?   Effort: Pulmonary effort is normal. No respiratory distress.  ?   Breath sounds: Normal breath sounds. No wheezing, rhonchi or rales.  ?Chest:  ?   Chest wall: No tenderness.  ?Abdominal:  ?   General: Bowel sounds are normal. There is no distension.  ?   Palpations: Abdomen is soft. There is no mass.  ?   Tenderness: There  is no abdominal tenderness. There is no right CVA tenderness, left CVA tenderness, guarding or rebound.  ?Musculoskeletal:     ?   General: No swelling or tenderness. Normal range of motion.  ?   Cervical ba

## 2021-10-10 LAB — CBC WITH DIFFERENTIAL/PLATELET
Absolute Monocytes: 474 cells/uL (ref 200–950)
Basophils Absolute: 47 cells/uL (ref 0–200)
Basophils Relative: 0.6 %
Eosinophils Absolute: 213 cells/uL (ref 15–500)
Eosinophils Relative: 2.7 %
HCT: 41.7 % (ref 35.0–45.0)
Hemoglobin: 13.6 g/dL (ref 11.7–15.5)
Lymphs Abs: 3207 cells/uL (ref 850–3900)
MCH: 28.7 pg (ref 27.0–33.0)
MCHC: 32.6 g/dL (ref 32.0–36.0)
MCV: 88 fL (ref 80.0–100.0)
MPV: 12.6 fL — ABNORMAL HIGH (ref 7.5–12.5)
Monocytes Relative: 6 %
Neutro Abs: 3958 cells/uL (ref 1500–7800)
Neutrophils Relative %: 50.1 %
Platelets: 224 10*3/uL (ref 140–400)
RBC: 4.74 10*6/uL (ref 3.80–5.10)
RDW: 13 % (ref 11.0–15.0)
Total Lymphocyte: 40.6 %
WBC: 7.9 10*3/uL (ref 3.8–10.8)

## 2021-10-10 LAB — LIPID PANEL
Cholesterol: 163 mg/dL (ref <200)
HDL: 65 mg/dL (ref >=50)
LDL Cholesterol (Calc): 83 mg/dL (calc)
Non-HDL Cholesterol (Calc): 98 mg/dL (calc) (ref <130)
Total CHOL/HDL Ratio: 2.5 (calc) (ref <5.0)
Triglycerides: 72 mg/dL (ref <150)

## 2021-10-10 LAB — COMPLETE METABOLIC PANEL WITH GFR
AG Ratio: 1 (calc) (ref 1.0–2.5)
ALT: 5 U/L — ABNORMAL LOW (ref 6–29)
AST: 12 U/L (ref 10–35)
Albumin: 4 g/dL (ref 3.6–5.1)
Alkaline phosphatase (APISO): 69 U/L (ref 37–153)
BUN/Creatinine Ratio: 15 (calc) (ref 6–22)
BUN: 17 mg/dL (ref 7–25)
CO2: 25 mmol/L (ref 20–32)
Calcium: 9.5 mg/dL (ref 8.6–10.4)
Chloride: 110 mmol/L (ref 98–110)
Creat: 1.16 mg/dL — ABNORMAL HIGH (ref 0.60–0.95)
Globulin: 3.9 g/dL (calc) — ABNORMAL HIGH (ref 1.9–3.7)
Glucose, Bld: 105 mg/dL — ABNORMAL HIGH (ref 65–99)
Potassium: 4.2 mmol/L (ref 3.5–5.3)
Sodium: 146 mmol/L (ref 135–146)
Total Bilirubin: 0.7 mg/dL (ref 0.2–1.2)
Total Protein: 7.9 g/dL (ref 6.1–8.1)
eGFR: 47 mL/min/{1.73_m2} — ABNORMAL LOW (ref >=60)

## 2021-10-10 LAB — TSH: TSH: 2.22 mIU/L (ref 0.40–4.50)

## 2021-10-10 LAB — HEMOGLOBIN A1C
Hgb A1c MFr Bld: 6.1 % of total Hgb — ABNORMAL HIGH (ref <5.7)
Mean Plasma Glucose: 128 mg/dL
eAG (mmol/L): 7.1 mmol/L

## 2021-10-14 ENCOUNTER — Other Ambulatory Visit: Payer: Self-pay | Admitting: *Deleted

## 2021-10-14 ENCOUNTER — Other Ambulatory Visit: Payer: Self-pay

## 2021-10-14 DIAGNOSIS — E782 Mixed hyperlipidemia: Secondary | ICD-10-CM

## 2021-10-14 DIAGNOSIS — F0150 Vascular dementia without behavioral disturbance: Secondary | ICD-10-CM

## 2021-10-14 DIAGNOSIS — E119 Type 2 diabetes mellitus without complications: Secondary | ICD-10-CM

## 2021-10-14 DIAGNOSIS — I10 Essential (primary) hypertension: Secondary | ICD-10-CM

## 2021-10-14 MED ORDER — QUETIAPINE FUMARATE 100 MG PO TABS: 100.0000 mg | ORAL_TABLET | Freq: Two times a day (BID) | ORAL | 1 refills | Status: DC

## 2021-10-14 NOTE — Telephone Encounter (Signed)
[facility] Requested refill.  °

## 2021-10-16 ENCOUNTER — Encounter: Payer: Self-pay | Admitting: Family

## 2021-10-16 ENCOUNTER — Ambulatory Visit: Payer: Medicare PPO | Admitting: Family

## 2021-10-16 VITALS — BP 138/90 | HR 89 | Temp 97.3°F

## 2021-10-16 DIAGNOSIS — E782 Mixed hyperlipidemia: Secondary | ICD-10-CM

## 2021-10-16 DIAGNOSIS — E119 Type 2 diabetes mellitus without complications: Secondary | ICD-10-CM | POA: Diagnosis not present

## 2021-10-16 DIAGNOSIS — H6123 Impacted cerumen, bilateral: Secondary | ICD-10-CM | POA: Diagnosis not present

## 2021-10-16 DIAGNOSIS — K5901 Slow transit constipation: Secondary | ICD-10-CM

## 2021-10-16 DIAGNOSIS — I10 Essential (primary) hypertension: Secondary | ICD-10-CM | POA: Diagnosis not present

## 2021-10-16 MED ORDER — SENNOSIDES-DOCUSATE SODIUM 8.6-50 MG PO TABS: 1.0000 | ORAL_TABLET | Freq: Every day | ORAL | 5 refills | Status: DC

## 2021-10-16 NOTE — Progress Notes (Signed)
? ?Provider: Richarda Blade FNP-C ? ?Javione Gunawan, Donalee Citrin, NP ? ?Patient Care Team: ?Darby Fleeman, Donalee Citrin, NP as PCP - General (Family Medicine) ? ?Extended Emergency Contact Information ?Primary Emergency Contact: Brown,[name] ? [place] ?Mobile Phone: [phone] ?Relation: Daughter ?Secondary Emergency Contact: Pinnix,Teressa ? [place] ?Mobile Phone: [phone] ?Relation: Daughter ? ?Code Status:  Full Code  ?Goals of care: Advanced Directive information ? ?  02/21/2021  ?  4:11 PM  ?Advanced Directives  ?Does Patient Have a Medical Advance Directive? No  ?Would patient like information on creating a medical advance directive? No - Patient declined  ? ? ? ?Chief Complaint  ?Patient presents with  ? Follow-up  ?  1 week follow up for Left/Right ear lavage. Weight not recorded due to defected scales which are in process of being replaced. Discuss labs (copy printed and given to patients daughter).Here with daughter Rosey Bath Pinnix   ? Lab work   ?  Review recent labs.  ? ? ?HPI:  ?Pt is a 86 y.o. female seen today for an acute visit for bilateral ear lavage.she was here 10/09/2021 for routine visit cerumen impaction was noted. She was advised to instil debrox 6.5 % otic solution 5 drops into each ear twice daily x 4 days then follow up today for ear lavage.she is here with her daughter,states has used debrox as directed. ?Recent lab results reviewed and discussed during visit.Labs unremarkable Hemoglobin A 1 C stable 6.1  ? ?Past Medical History:  ?Diagnosis Date  ? Anxiety state, unspecified   ? Cataract   ? Depressive disorder, not elsewhere classified   ? Lumbago   ? Memory loss   ? Other and unspecified hyperlipidemia   ? Rickets, late effect   ? Tobacco use disorder   ? Unspecified constipation   ? Unspecified essential hypertension   ? Varicose veins of lower extremities   ? ?Past Surgical History:  ?Procedure Laterality Date  ? CYST EXCISION Bilateral   ? behind ears  ? MOUTH SURGERY   04/12/2014  ? ? ?Allergies  ?Allergen Reactions  ? Penicillins Swelling and Rash  ?  Tolerate ceftriaxone  ? ? ?Outpatient Encounter Medications as of 10/16/2021  ?Medication Sig  ? acetaminophen (TYLENOL) 325 MG tablet Take 1 tablet (325 mg total) by mouth daily as needed (pain).  ? alendronate (FOSAMAX) 70 MG tablet TAKE 1 TABLET(70 MG) BY MOUTH 1 TIME A WEEK WITH A FULL GLASS OF WATER AND ON AN EMPTY STOMACH  ? amLODipine (NORVASC) 2.5 MG tablet Take 1 tablet (2.5 mg total) by mouth daily.  ? atorvastatin (LIPITOR) 20 MG tablet TAKE 1 TABLET(20 MG) BY MOUTH DAILY  ? Calcium Carb-Cholecalciferol (CALCIUM 600 + D PO) Take 1 tablet by mouth daily.  ? Cholecalciferol (VITAMIN D3 PO) Take 1,000 Units by mouth. Daily-otc  ? lisinopril (ZESTRIL) 5 MG tablet TAKE 1 TABLET(5 MG) BY MOUTH DAILY FOR KIDNEY PROTECTION  ? Memantine HCl-Donepezil HCl (NAMZARIC) 28-10 MG CP24 TAKE 1 CAPSULE BY MOUTH DAILY  ? Multiple Vitamins-Minerals (MULTIVITAMIN WITH MINERALS) tablet Take 1 tablet by mouth daily with supper. Centrum Silver  ? nystatin (MYCOSTATIN/NYSTOP) powder Apply topically 2 (two) times daily. To groin and abdomen area  ? nystatin cream (MYCOSTATIN) Apply 1 application. topically 2 (two) times daily.  ? polyethylene glycol (MIRALAX / GLYCOLAX) packet Take 17 g by mouth daily.  ? Probiotic Product (PROBIOTIC DAILY PO) Take by mouth. Culturelle--otc  ? QUEtiapine (SEROQUEL) 100 MG tablet Take 1 tablet (100  mg total) by mouth 2 (two) times daily.  ? [DISCONTINUED] senna-docusate (SENOKOT-S) 8.6-50 MG tablet Take 1 tablet by mouth at bedtime.  ? senna-docusate (SENOKOT-S) 8.6-50 MG tablet Take 1 tablet by mouth at bedtime.  ? ?No facility-administered encounter medications on file as of 10/16/2021.  ? ? ?Review of Systems  ?Constitutional:  Negative for appetite change, chills, fatigue and fever.  ?HENT:  Negative for congestion, ear discharge, ear pain, rhinorrhea, sinus pressure, sinus pain, sneezing and sore throat.    ?Respiratory:  Negative for cough, chest tightness, shortness of breath and wheezing.   ?Cardiovascular:  Negative for chest pain, palpitations and leg swelling.  ?Gastrointestinal:  Positive for constipation. Negative for abdominal distention, abdominal pain, diarrhea, nausea and vomiting.  ?Musculoskeletal:  Positive for gait problem. Negative for joint swelling and myalgias.  ? ?Immunization History  ?Administered Date(s) Administered  ? Fluad Quad(high Dose 65+) 02/23/2019, 04/25/2020, 03/14/2021  ? Influenza Whole 04/06/2012  ? Influenza,inj,Quad PF,6+ Mos 03/01/2013, 06/08/2014, 02/16/2015, 02/01/2016, 03/03/2017, 03/24/2018  ? PFIZER(Purple Top)SARS-COV-2 Vaccination 07/14/2019, 08/04/2019, 05/10/2020  ? Research officer, [employer] Covid-19 Vaccine Bivalent Booster 2213yrs & up 06/13/2021  ? Pneumococcal Conjugate-13 12/15/2014  ? Pneumococcal Polysaccharide-23 07/10/2011  ? Td 07/10/2011  ? Tdap 06/08/2014  ? Zoster Recombinat (Shingrix) 11/19/2017, 04/12/2018, 05/10/2018, 05/10/2018, 05/12/2018  ? ?Pertinent  Health Maintenance Due  ?Topic Date Due  ? FOOT EXAM  Never done  ? OPHTHALMOLOGY EXAM  Never done  ? INFLUENZA VACCINE  01/07/2022  ? HEMOGLOBIN A1C  04/11/2022  ? DEXA SCAN  Completed  ? ? ?  07/26/2020  ?  1:53 PM 11/28/2020  ?  3:06 PM 02/21/2021  ?  4:06 PM 10/09/2021  ?  9:30 AM 10/16/2021  ?  2:41 PM  ?Fall Risk  ?Falls in the past year? 0 0 0 0 0  ?Was there an injury with Fall? 0 0 0 0 0  ?Fall Risk Category Calculator 0 0 0 0 0  ?Fall Risk Category Low Low Low Low Low  ?Patient Fall Risk Level  Low fall risk Low fall risk Low fall risk Low fall risk  ?Patient at Risk for Falls Due to  No Fall Risks No Fall Risks No Fall Risks No Fall Risks  ?Fall risk Follow up   Falls evaluation completed Falls evaluation completed Falls evaluation completed  ? ?Functional Status Survey: ?  ? ?Vitals:  ? 10/16/21 1440  ?BP: 138/90  ?Pulse: 89  ?Temp: (!) 97.3 ?F (36.3 ?C)  ?TempSrc: Temporal  ?SpO2: 96%  ? ?There is no height or weight  on file to calculate BMI. ?Physical Exam ?Vitals reviewed.  ?Constitutional:   ?   General: She is not in acute distress. ?   Appearance: Normal appearance. She is not ill-appearing or diaphoretic.  ?HENT:  ?   Head: Normocephalic.  ?   Right Ear: Tympanic membrane, ear canal and external ear normal. There is impacted cerumen.  ?   Left Ear: Tympanic membrane, ear canal and external ear normal. There is impacted cerumen.  ?   Ears:  ?   Comments: Bilateral ear cerumen lavaged with warm water and hydrogen peroxide moderate amounts of cerumen obtained.Tolerated procedure well.TM clear without any signs of infection. ? ?   Nose: Nose normal. No congestion or rhinorrhea.  ?   Mouth/Throat:  ?   Mouth: Mucous membranes are moist.  ?   Pharynx: Oropharynx is clear. No oropharyngeal exudate or posterior oropharyngeal erythema.  ?Eyes:  ?   General: No scleral  icterus.    ?   Right eye: No discharge.     ?   Left eye: No discharge.  ?   Extraocular Movements: Extraocular movements intact.  ?   Conjunctiva/sclera: Conjunctivae normal.  ?   Pupils: Pupils are equal, round, and reactive to light.  ?Neck:  ?   Vascular: No carotid bruit.  ?Cardiovascular:  ?   Rate and Rhythm: Normal rate and regular rhythm.  ?   Pulses: Normal pulses.  ?   Heart sounds: Normal heart sounds. No murmur heard. ?  No friction rub. No gallop.  ?Pulmonary:  ?   Effort: Pulmonary effort is normal. No respiratory distress.  ?   Breath sounds: Normal breath sounds. No wheezing, rhonchi or rales.  ?Chest:  ?   Chest wall: No tenderness.  ?Abdominal:  ?   General: Bowel sounds are normal. There is no distension.  ?   Palpations: Abdomen is soft. There is no mass.  ?   Tenderness: There is no abdominal tenderness. There is no right CVA tenderness, left CVA tenderness, guarding or rebound.  ?Musculoskeletal:  ?   Cervical back: Normal range of motion.  ?Lymphadenopathy:  ?   Cervical: No cervical adenopathy.  ?Skin: ?   General: Skin is warm and dry.  ?    Coloration: Skin is not pale.  ?   Findings: No erythema or rash.  ?Neurological:  ?   Mental Status: She is alert and oriented to person, place, and time.  ?   Motor: No weakness.  ?   Gait: Gait normal.  ?P

## 2021-12-25 ENCOUNTER — Other Ambulatory Visit: Payer: Self-pay | Admitting: Family

## 2021-12-25 DIAGNOSIS — M8588 Other specified disorders of bone density and structure, other site: Secondary | ICD-10-CM

## 2022-02-20 ENCOUNTER — Other Ambulatory Visit: Payer: Self-pay | Admitting: Family

## 2022-02-20 DIAGNOSIS — H524 Presbyopia: Secondary | ICD-10-CM | POA: Diagnosis not present

## 2022-02-20 DIAGNOSIS — H52203 Unspecified astigmatism, bilateral: Secondary | ICD-10-CM | POA: Diagnosis not present

## 2022-02-20 DIAGNOSIS — Z961 Presence of intraocular lens: Secondary | ICD-10-CM | POA: Diagnosis not present

## 2022-02-26 ENCOUNTER — Ambulatory Visit (INDEPENDENT_AMBULATORY_CARE_PROVIDER_SITE_OTHER): Payer: Medicare PPO | Admitting: Family

## 2022-02-26 ENCOUNTER — Encounter: Payer: Self-pay | Admitting: Family

## 2022-02-26 DIAGNOSIS — Z0000 Encounter for general adult medical examination without abnormal findings: Secondary | ICD-10-CM

## 2022-02-26 DIAGNOSIS — B372 Candidiasis of skin and nail: Secondary | ICD-10-CM

## 2022-02-26 MED ORDER — NYSTATIN 100000 UNIT/GM EX CREA: 1.0000 | TOPICAL_CREAM | Freq: Two times a day (BID) | CUTANEOUS | 0 refills | Status: DC

## 2022-02-26 NOTE — Patient Instructions (Signed)
Carol [name] , Thank you for taking time to come for your Medicare Wellness Visit. I appreciate your ongoing commitment to your health goals. Please review the following plan we discussed and let me know if I can assist you in the future.   Screening recommendations/referrals: Colonoscopy : N/A  Mammogram : Up to date  Bone Density : Up to date  Recommended yearly ophthalmology/optometry visit for glaucoma screening and checkup Recommended yearly dental visit for hygiene and checkup  Vaccinations: Influenza vaccine- due annually in September/October Pneumococcal vaccine : Up to date  Tdap vaccine Up to date  Shingles vaccine Up to date     Advanced directives: No   Conditions/risks identified: Cardiac Risk Factors include: advanced age (>52men, >58 women);diabetes mellitus;hypertension;sedentary lifestyle;smoking/ tobacco exposure;obesity (BMI >30kg/m2)  Next appointment: 1 year    Preventive Care 84 Years and Older, Female Preventive care refers to lifestyle choices and visits with your health care provider that can promote health and wellness. What does preventive care include? A yearly physical exam. This is also called an annual well check. Dental exams once or twice a year. Routine eye exams. Ask your health care provider how often you should have your eyes checked. Personal lifestyle choices, including: Daily care of your teeth and gums. Regular physical activity. Eating a healthy diet. Avoiding tobacco and drug use. Limiting alcohol use. Practicing safe sex. Taking low-dose aspirin every day. Taking vitamin and mineral supplements as recommended by your health care provider. What happens during an annual well check? The services and screenings done by your health care provider during your annual well check will depend on your age, overall health, lifestyle risk factors, and family history of disease. Counseling  Your health care provider may ask you questions about  your: Alcohol use. Tobacco use. Drug use. Emotional well-being. Home and relationship well-being. Sexual activity. Eating habits. History of falls. Memory and ability to understand (cognition). Work and work Statistician. Reproductive health. Screening  You may have the following tests or measurements: Height, weight, and BMI. Blood pressure. Lipid and cholesterol levels. These may be checked every 5 years, or more frequently if you are over 84 years old. Skin check. Lung cancer screening. You may have this screening every year starting at age 84 if you have a 30-pack-year history of smoking and currently smoke or have quit within the past 15 years. Fecal occult blood test (FOBT) of the stool. You may have this test every year starting at age 84. Flexible sigmoidoscopy or colonoscopy. You may have a sigmoidoscopy every 5 years or a colonoscopy every 10 years starting at age 84. Hepatitis C blood test. Hepatitis B blood test. Sexually transmitted disease (STD) testing. Diabetes screening. This is done by checking your blood sugar (glucose) after you have not eaten for a while (fasting). You may have this done every 1-3 years. Bone density scan. This is done to screen for osteoporosis. You may have this done starting at age 84. Mammogram. This may be done every 1-2 years. Talk to your health care provider about how often you should have regular mammograms. Talk with your health care provider about your test results, treatment options, and if necessary, the need for more tests. Vaccines  Your health care provider may recommend certain vaccines, such as: Influenza vaccine. This is recommended every year. Tetanus, diphtheria, and acellular pertussis (Tdap, Td) vaccine. You may need a Td booster every 10 years. Zoster vaccine. You may need this after age 84. Pneumococcal 13-valent conjugate (PCV13) vaccine. One  dose is recommended after age 84. Pneumococcal polysaccharide (PPSV23) vaccine.  One dose is recommended after age 84. Talk to your health care provider about which screenings and vaccines you need and how often you need them. This information is not intended to replace advice given to you by your health care provider. Make sure you discuss any questions you have with your health care provider. Document Released: 06/22/2015 Document Revised: 02/13/2016 Document Reviewed: 03/27/2015 Elsevier Interactive Patient Education  2017 [place] Prevention in the Home Falls can cause injuries. They can happen to people of all ages. There are many things you can do to make your home safe and to help prevent falls. What can I do on the outside of my home? Regularly fix the edges of walkways and driveways and fix any cracks. Remove anything that might make you trip as you walk through a door, such as a raised step or threshold. Trim any bushes or trees on the path to your home. Use bright outdoor lighting. Clear any walking paths of anything that might make someone trip, such as rocks or tools. Regularly check to see if handrails are loose or broken. Make sure that both sides of any steps have handrails. Any raised decks and porches should have guardrails on the edges. Have any leaves, snow, or ice cleared regularly. Use sand or salt on walking paths during winter. Clean up any spills in your garage right away. This includes oil or grease spills. What can I do in the bathroom? Use night lights. Install grab bars by the toilet and in the tub and shower. Do not use towel bars as grab bars. Use non-skid mats or decals in the tub or shower. If you need to sit down in the shower, use a plastic, non-slip stool. Keep the floor dry. Clean up any water that spills on the floor as soon as it happens. Remove soap buildup in the tub or shower regularly. Attach bath mats securely with double-sided non-slip rug tape. Do not have throw rugs and other things on the floor that can make  you trip. What can I do in the bedroom? Use night lights. Make sure that you have a light by your bed that is easy to reach. Do not use any sheets or blankets that are too big for your bed. They should not hang down onto the floor. Have a firm chair that has side arms. You can use this for support while you get dressed. Do not have throw rugs and other things on the floor that can make you trip. What can I do in the kitchen? Clean up any spills right away. Avoid walking on wet floors. Keep items that you use a lot in easy-to-reach places. If you need to reach something above you, use a strong step stool that has a grab bar. Keep electrical cords out of the way. Do not use floor polish or wax that makes floors slippery. If you must use wax, use non-skid floor wax. Do not have throw rugs and other things on the floor that can make you trip. What can I do with my stairs? Do not leave any items on the stairs. Make sure that there are handrails on both sides of the stairs and use them. Fix handrails that are broken or loose. Make sure that handrails are as long as the stairways. Check any carpeting to make sure that it is firmly attached to the stairs. Fix any carpet that is loose or worn.  Avoid having throw rugs at the top or bottom of the stairs. If you do have throw rugs, attach them to the floor with carpet tape. Make sure that you have a light switch at the top of the stairs and the bottom of the stairs. If you do not have them, ask someone to add them for you. What else can I do to help prevent falls? Wear shoes that: Do not have high heels. Have rubber bottoms. Are comfortable and fit you well. Are closed at the toe. Do not wear sandals. If you use a stepladder: Make sure that it is fully opened. Do not climb a closed stepladder. Make sure that both sides of the stepladder are locked into place. Ask someone to hold it for you, if possible. Clearly mark and make sure that you can  see: Any grab bars or handrails. First and last steps. Where the edge of each step is. Use tools that help you move around (mobility aids) if they are needed. These include: Canes. Walkers. Scooters. Crutches. Turn on the lights when you go into a dark area. Replace any light bulbs as soon as they burn out. Set up your furniture so you have a clear path. Avoid moving your furniture around. If any of your floors are uneven, fix them. If there are any pets around you, be aware of where they are. Review your medicines with your doctor. Some medicines can make you feel dizzy. This can increase your chance of falling. Ask your doctor what other things that you can do to help prevent falls. This information is not intended to replace advice given to you by your health care provider. Make sure you discuss any questions you have with your health care provider. Document Released: 03/22/2009 Document Revised: 11/01/2015 Document Reviewed: 06/30/2014 Elsevier Interactive Patient Education  2017 [employer].

## 2022-02-26 NOTE — Progress Notes (Signed)
This service is provided via telemedicine  No vital signs collected/recorded due to the encounter was a telemedicine visit.   Location of patient (ex: home, work):  Home  Patient consents to a telephone visit:  Yes  Location of the provider (ex: office, home):  [employer].   Name of any referring provider:  Magda Muise, Nelda Bucks, NP   Names of all persons participating in the telemedicine service and their role in the encounter:  Patient, Carol [name], Heriberto [place], [place], Austin Pongratz, [place], NP.    Time spent on call: 8 minutes spent on the phone with Medical Assistant.       Subjective:   Carol [name] is a 86 y.o. female who presents for Medicare Annual (Subsequent) preventive examination.  Review of Systems     Cardiac Risk Factors include: advanced age (>12men, >74 women);diabetes mellitus;hypertension;sedentary lifestyle;smoking/ tobacco exposure;obesity (BMI >30kg/m2)     Objective:    There were no vitals filed for this visit. There is no height or weight on file to calculate BMI.     02/26/2022    3:29 PM 02/21/2021    4:11 PM 11/28/2020    3:11 PM 07/26/2020    1:54 PM 01/26/2018    3:41 PM 01/03/2018    8:46 PM 01/03/2018   11:00 AM  Advanced Directives  Does Patient Have a Medical Advance Directive? No No No Yes Yes    Type of Visual merchandiser of [employer] Power of Attorney  Does patient want to make changes to medical advance directive?    No - Patient declined  No - Patient declined   Copy of [place] in Chart?    Yes - validated most recent copy scanned in chart (See row information) No - copy requested  No - copy requested  Would patient like information on creating a medical advance directive? No - Patient declined No - Patient declined No - Patient declined        Current Medications (verified) Outpatient Encounter Medications as of 02/26/2022  Medication  Sig   acetaminophen (TYLENOL) 325 MG tablet Take 1 tablet (325 mg total) by mouth daily as needed (pain).   alendronate (FOSAMAX) 70 MG tablet TAKE 1 TABLET(70 MG) BY MOUTH 1 TIME A WEEK WITH A FULL GLASS OF WATER AND ON AN EMPTY STOMACH   amLODipine (NORVASC) 2.5 MG tablet Take 1 tablet (2.5 mg total) by mouth daily.   atorvastatin (LIPITOR) 20 MG tablet TAKE 1 TABLET(20 MG) BY MOUTH DAILY   Calcium Carb-Cholecalciferol (CALCIUM 600 + D PO) Take 1 tablet by mouth daily.   Cholecalciferol (VITAMIN D3 PO) Take 1,000 Units by mouth. Daily-otc   lisinopril (ZESTRIL) 5 MG tablet TAKE 1 TABLET(5 MG) BY MOUTH DAILY FOR KIDNEY PROTECTION   Memantine HCl-Donepezil HCl (NAMZARIC) 28-10 MG CP24 TAKE 1 CAPSULE BY MOUTH DAILY   Multiple Vitamins-Minerals (MULTIVITAMIN WITH MINERALS) tablet Take 1 tablet by mouth daily with supper. Centrum Silver   nystatin (MYCOSTATIN/NYSTOP) powder Apply topically 2 (two) times daily. To groin and abdomen area   nystatin cream (MYCOSTATIN) Apply 1 application. topically 2 (two) times daily.   polyethylene glycol (MIRALAX / GLYCOLAX) packet Take 17 g by mouth daily.   Probiotic Product (PROBIOTIC DAILY PO) Take by mouth. Culturelle--otc   QUEtiapine (SEROQUEL) 100 MG tablet Take 1 tablet (100 mg total) by mouth 2 (two) times daily.   senna-docusate (SENOKOT-S) 8.6-50 MG tablet Take  1 tablet by mouth at bedtime.   No facility-administered encounter medications on file as of 02/26/2022.    Allergies (verified) Penicillins   History: Past Medical History:  Diagnosis Date   Anxiety state, unspecified    Cataract    Depressive disorder, not elsewhere classified    Lumbago    Memory loss    Other and unspecified hyperlipidemia    Rickets, late effect    Tobacco use disorder    Unspecified constipation    Unspecified essential hypertension    Varicose veins of lower extremities    Past Surgical History:  Procedure Laterality Date   CYST EXCISION Bilateral     behind ears   MOUTH SURGERY  04/12/2014   Family History  Problem Relation Age of Onset   Heart disease Father    Social History   Socioeconomic History   Marital status: Married    Spouse name: Not on file   Number of children: Not on file   Years of education: Not on file   Highest education level: Not on file  Occupational History   Not on file  Tobacco Use   Smoking status: Former    Years: 55.00    Types: Cigarettes    Quit date: 05/09/2017    Years since quitting: 4.8   Smokeless tobacco: Never  Vaping Use   Vaping Use: Never used  Substance and Sexual Activity   Alcohol use: No    Alcohol/week: 0.0 standard drinks of alcohol   Drug use: No   Sexual activity: Not on file  Other Topics Concern   Not on file  Social History Narrative   Not on file   Social Determinants of Health   Financial Resource Strain: Low Risk  (11/11/2017)   Overall Financial Resource Strain (CARDIA)    Difficulty of Paying Living Expenses: Not hard at all  Food Insecurity: No Food Insecurity (11/11/2017)   Hunger Vital Sign    Worried About Running Out of Food in the Last Year: Never true    Ran Out of Food in the Last Year: Never true  Transportation Needs: No Transportation Needs (11/11/2017)   PRAPARE - Hydrologist (Medical): No    Lack of Transportation (Non-Medical): No  Physical Activity: Inactive (11/11/2017)   Exercise Vital Sign    Days of Exercise per Week: 0 days    Minutes of Exercise per Session: 0 min  Stress: Not on file  Social Connections: Moderately Integrated (11/11/2017)   Social Connection and Isolation Panel [NHANES]    Frequency of Communication with Friends and Family: More than three times a week    Frequency of Social Gatherings with Friends and Family: More than three times a week    Attends Religious Services: More than 4 times per year    Active Member of [employer] or Organizations: No    Attends Music therapist: Never     Marital Status: Married    Tobacco Counseling Counseling given: Not Answered   Clinical Intake:  Pre-visit preparation completed: No  Pain : No/denies pain     BMI - recorded: 31.55 Nutritional Status: BMI > 30  Obese Nutritional Risks: None Diabetes: No  How often do you need to have someone help you when you read instructions, pamphlets, or other written materials from your doctor or pharmacy?: 3 - Sometimes What is the last grade level you completed in school?: 2 yrs cologe  Diabetic?No   Interpreter Needed?: No  Activities of Daily Living    02/26/2022    3:57 PM  In your present state of health, do you have any difficulty performing the following activities:  Hearing? 0  Vision? 0  Difficulty concentrating or making decisions? 1  Comment memory  Walking or climbing stairs? 1  Comment uses a walker  Dressing or bathing? 1  Doing errands, shopping? 1  Comment daughters [place] and eating ? Y  Comment [name]  Using the Toilet? N  In the past six months, have you accidently leaked urine? Y  Do you have problems with loss of bowel control? N  Managing your Medications? Y  Comment [name] assist  Managing your Finances? Y  Comment [name] assist  Housekeeping or managing your Housekeeping? Y  Comment [name] assist    Patient Care Team: Chrissa Meetze, Nelda Bucks, NP as PCP - General (Family Medicine)  Indicate any recent Medical Services you may have received from other than [facility] providers in the past year (date may be approximate).     Assessment:   This is a routine wellness examination for [place].  Hearing/Vision screen Hearing Screening - Comments:: No Hearing Concerns   Vision Screening - Comments:: No Vision Concerns. Patient wears prescription glasses. Patient last eye exam 02/19/2022.  Dietary issues and exercise activities discussed: Current Exercise Habits: The patient does not participate in regular exercise at present,  Exercise limited by: None identified   Goals Addressed             This Visit's Progress    Community involvement   Not on track    Pt would like to get involved in the community      Patient Stated   On track    Live long and do the best she can        Depression Screen    02/26/2022    3:25 PM 02/21/2021    4:06 PM 07/26/2020    1:54 PM 10/26/2019   11:26 AM 10/26/2019   10:34 AM 02/23/2019    3:13 PM 07/27/2018    3:31 PM  PHQ 2/9 Scores  PHQ - 2 Score 0 0 0 0 0  0  PHQ- 9 Score    3     Exception Documentation      Other- indicate reason in comment box   Not completed      unable to assess due to dementia     Fall Risk    02/26/2022    3:25 PM 10/16/2021    2:41 PM 10/09/2021    9:30 AM 02/21/2021    4:06 PM 11/28/2020    3:06 PM  Fall Risk   Falls in the past year? 0 0 0 0 0  Number falls in past yr: 0 0 0 0 0  Injury with Fall? 0 0 0 0 0  Risk for fall due to : No Fall Risks No Fall Risks No Fall Risks No Fall Risks No Fall Risks  Follow up Falls evaluation completed Falls evaluation completed Falls evaluation completed Falls evaluation completed     [place]:  Any stairs in or around the home? No  If so, are there any without handrails? No  Home free of loose throw rugs in walkways, pet beds, electrical cords, etc? No  Adequate lighting in your home to reduce risk of falls? Yes   ASSISTIVE DEVICES UTILIZED TO PREVENT FALLS:  Life alert? No  Use of a cane,  walker or w/c? Yes  Grab bars in the bathroom? Yes  Shower chair or bench in shower? Yes  Elevated toilet seat or a handicapped toilet? Yes   TIMED UP AND GO:  Was the test performed? No .  Length of time to ambulate 10 feet: N/A sec.   Gait slow and steady with assistive device  Cognitive Function:    02/23/2019    3:58 PM 11/11/2017    2:00 PM 10/29/2016    2:46 PM 12/15/2014    3:23 PM 12/14/2013    3:09 PM  MMSE - Mini Mental State Exam  Orientation to time 1  0 0 1 3  Orientation to Place 4 3 3 4 5   Registration 3 3 3 3 3   Attention/ Calculation 5 5 5 5 5   Recall 0 0 0 2 0  Language- name 2 objects 2 2 2 2 2   Language- repeat 1 1 1 1 1   Language- follow 3 step command 3 3 3 3 3   Language- read & follow direction 1 1 1 1 1   Write a sentence 1 1 1 1 1   Copy design 1 1 0 1 1  Total score 22 20 19 24 25         02/26/2022    3:26 PM 02/21/2021    4:07 PM  6CIT Screen  What Year? 4 points 4 points  What month? 3 points 3 points  What time? 0 points 0 points  Count back from 20 4 points 0 points  Months in reverse 4 points 4 points  Repeat phrase 10 points 10 points  Total Score 25 points 21 points    Immunizations Immunization History  Administered Date(s) Administered   Fluad Quad(high Dose 65+) 02/23/2019, 04/25/2020, 03/14/2021   Influenza Whole 04/06/2012   Influenza,inj,Quad PF,6+ Mos 03/01/2013, 06/08/2014, 02/16/2015, 02/01/2016, 03/03/2017, 03/24/2018   PFIZER(Purple Top)SARS-COV-2 Vaccination 07/14/2019, 08/04/2019, 05/10/2020   Pfizer Covid-19 Vaccine Bivalent Booster 32yrs & up 06/13/2021   Pneumococcal Conjugate-13 12/15/2014   Pneumococcal Polysaccharide-23 07/10/2011   Td 07/10/2011   Tdap 06/08/2014   Zoster Recombinat (Shingrix) 11/19/2017, 04/12/2018, 05/10/2018, 05/10/2018, 05/12/2018    TDAP status: Up to date  Flu Vaccine status: Due, Education has been provided regarding the importance of this vaccine. Advised may receive this vaccine at local pharmacy or Health Dept. Aware to provide a copy of the vaccination record if obtained from local pharmacy or Health Dept. Verbalized acceptance and understanding.  Pneumococcal vaccine status: Up to date  Covid-19 vaccine status: Information provided on how to obtain vaccines.   Qualifies for Shingles Vaccine? Yes   Zostavax completed Yes   Shingrix Completed?: Yes  Screening Tests Health Maintenance  Topic Date Due   FOOT EXAM  Never done   OPHTHALMOLOGY EXAM   Never done   COVID-19 Vaccine (5 - Pfizer series) 10/11/2021   Diabetic kidney evaluation - Urine ACR  12/06/2021   INFLUENZA VACCINE  01/07/2022   HEMOGLOBIN A1C  04/11/2022   Diabetic kidney evaluation - GFR measurement  10/10/2022   TETANUS/TDAP  06/08/2024   Pneumonia Vaccine 71+ Years old  Completed   DEXA SCAN  Completed   Zoster Vaccines- Shingrix  Completed   HPV VACCINES  Aged Out    Health Maintenance  Health Maintenance Due  Topic Date Due   FOOT EXAM  Never done   OPHTHALMOLOGY EXAM  Never done   COVID-19 Vaccine (5 - [place] series) 10/11/2021   Diabetic kidney evaluation - Urine ACR  12/06/2021  INFLUENZA VACCINE  01/07/2022    Colorectal cancer screening: No longer required.   Mammogram status: No longer required due to age.  Bone Density status: Completed 01/07/2021. Results reflect: Bone density results: OSTEOPENIA. Repeat every 5 years.  Lung Cancer Screening: (Low Dose CT Chest recommended if Age 59-80 years, 30 pack-year currently smoking OR have quit w/in 15years.) does qualify.   Lung Cancer Screening Referral: No   Additional Screening:  Hepatitis C Screening: does not qualify; Completed No   Vision Screening: Recommended annual ophthalmology exams for early detection of glaucoma and other disorders of the eye. Is the patient up to date with their annual eye exam?  Yes  Who is the provider or what is the name of the office in which the patient attends annual eye exams? Dr.Shapiro  If pt is not established with a provider, would they like to be referred to a provider to establish care? No .   Dental Screening: Recommended annual dental exams for proper oral hygiene  Community Resource Referral / Chronic Care Management: CRR required this visit?  No   CCM required this visit?  No      Plan:     I have personally reviewed and noted the following in the patient's chart:   Medical and social history Use of alcohol, tobacco or illicit drugs   Current medications and supplements including opioid prescriptions. Patient is not currently taking opioid prescriptions. Functional ability and status Nutritional status Physical activity Advanced directives List of other physicians Hospitalizations, surgeries, and ER visits in previous 12 months Vitals Screenings to include cognitive, depression, and falls Referrals and appointments  In addition, I have reviewed and discussed with patient certain preventive protocols, quality metrics, and best practice recommendations. A written personalized care plan for preventive services as well as general preventive health recommendations were provided to patient.     Sandrea Hughs, NP   02/26/2022   Nurse Notes: she will get her Influenza and COVID-19 booster vaccine at her Pharmacy

## 2022-04-07 ENCOUNTER — Other Ambulatory Visit: Payer: Self-pay | Admitting: Family

## 2022-04-07 DIAGNOSIS — F0280 Dementia in other diseases classified elsewhere without behavioral disturbance: Secondary | ICD-10-CM

## 2022-04-09 ENCOUNTER — Other Ambulatory Visit: Payer: Medicare PPO

## 2022-04-09 ENCOUNTER — Other Ambulatory Visit: Payer: Self-pay

## 2022-04-09 DIAGNOSIS — F0150 Vascular dementia without behavioral disturbance: Secondary | ICD-10-CM

## 2022-04-09 DIAGNOSIS — E782 Mixed hyperlipidemia: Secondary | ICD-10-CM

## 2022-04-09 DIAGNOSIS — I10 Essential (primary) hypertension: Secondary | ICD-10-CM | POA: Diagnosis not present

## 2022-04-09 DIAGNOSIS — G309 Alzheimer's disease, unspecified: Secondary | ICD-10-CM | POA: Diagnosis not present

## 2022-04-09 DIAGNOSIS — E119 Type 2 diabetes mellitus without complications: Secondary | ICD-10-CM | POA: Diagnosis not present

## 2022-04-09 DIAGNOSIS — F0280 Dementia in other diseases classified elsewhere without behavioral disturbance: Secondary | ICD-10-CM | POA: Diagnosis not present

## 2022-04-10 LAB — BASIC METABOLIC PANEL WITH GFR
BUN/Creatinine Ratio: 11 (calc) (ref 6–22)
BUN: 13 mg/dL (ref 7–25)
CO2: 24 mmol/L (ref 20–32)
Calcium: 9.5 mg/dL (ref 8.6–10.4)
Chloride: 106 mmol/L (ref 98–110)
Creat: 1.21 mg/dL — ABNORMAL HIGH (ref 0.60–0.95)
Glucose, Bld: 90 mg/dL (ref 65–99)
Potassium: 3.8 mmol/L (ref 3.5–5.3)
Sodium: 144 mmol/L (ref 135–146)
eGFR: 44 mL/min/{1.73_m2} — ABNORMAL LOW (ref >=60)

## 2022-04-10 LAB — HEMOGLOBIN A1C
Hgb A1c MFr Bld: 6.2 % of total Hgb — ABNORMAL HIGH (ref <5.7)
Mean Plasma Glucose: 131 mg/dL
eAG (mmol/L): 7.3 mmol/L

## 2022-04-10 LAB — CBC WITH DIFFERENTIAL/PLATELET
Absolute Monocytes: 410 cells/uL (ref 200–950)
Basophils Absolute: 43 cells/uL (ref 0–200)
Basophils Relative: 0.6 %
Eosinophils Absolute: 252 cells/uL (ref 15–500)
Eosinophils Relative: 3.5 %
HCT: 41.7 % (ref 35.0–45.0)
Hemoglobin: 13.8 g/dL (ref 11.7–15.5)
Lymphs Abs: 3046 cells/uL (ref 850–3900)
MCH: 28.9 pg (ref 27.0–33.0)
MCHC: 33.1 g/dL (ref 32.0–36.0)
MCV: 87.4 fL (ref 80.0–100.0)
MPV: 12.8 fL — ABNORMAL HIGH (ref 7.5–12.5)
Monocytes Relative: 5.7 %
Neutro Abs: 3449 cells/uL (ref 1500–7800)
Neutrophils Relative %: 47.9 %
Platelets: 182 10*3/uL (ref 140–400)
RBC: 4.77 10*6/uL (ref 3.80–5.10)
RDW: 13.1 % (ref 11.0–15.0)
Total Lymphocyte: 42.3 %
WBC: 7.2 10*3/uL (ref 3.8–10.8)

## 2022-04-10 LAB — LIPID PANEL
Cholesterol: 160 mg/dL (ref <200)
HDL: 64 mg/dL (ref >=50)
LDL Cholesterol (Calc): 77 mg/dL (calc)
Non-HDL Cholesterol (Calc): 96 mg/dL (calc) (ref <130)
Total CHOL/HDL Ratio: 2.5 (calc) (ref <5.0)
Triglycerides: 102 mg/dL (ref <150)

## 2022-04-11 ENCOUNTER — Ambulatory Visit: Payer: Medicare PPO | Admitting: Family

## 2022-04-17 ENCOUNTER — Encounter: Payer: Self-pay | Admitting: Family

## 2022-04-17 ENCOUNTER — Ambulatory Visit (INDEPENDENT_AMBULATORY_CARE_PROVIDER_SITE_OTHER): Payer: Medicare PPO | Admitting: Family

## 2022-04-17 VITALS — BP 110/70 | HR 82 | Temp 96.0°F | Resp 16 | Ht 64.0 in | Wt 170.2 lb

## 2022-04-17 DIAGNOSIS — G309 Alzheimer's disease, unspecified: Secondary | ICD-10-CM | POA: Diagnosis not present

## 2022-04-17 DIAGNOSIS — Z23 Encounter for immunization: Secondary | ICD-10-CM

## 2022-04-17 DIAGNOSIS — E119 Type 2 diabetes mellitus without complications: Secondary | ICD-10-CM | POA: Diagnosis not present

## 2022-04-17 DIAGNOSIS — I10 Essential (primary) hypertension: Secondary | ICD-10-CM

## 2022-04-17 DIAGNOSIS — E782 Mixed hyperlipidemia: Secondary | ICD-10-CM

## 2022-04-17 DIAGNOSIS — F0150 Vascular dementia without behavioral disturbance: Secondary | ICD-10-CM

## 2022-04-17 DIAGNOSIS — Z6829 Body mass index (BMI) 29.0-29.9, adult: Secondary | ICD-10-CM

## 2022-04-17 DIAGNOSIS — F0280 Dementia in other diseases classified elsewhere without behavioral disturbance: Secondary | ICD-10-CM

## 2022-04-17 DIAGNOSIS — R350 Frequency of micturition: Secondary | ICD-10-CM

## 2022-04-17 NOTE — Progress Notes (Signed)
Provider: Marlowe Sax FNP-C   Joyel Chenette, Nelda Bucks, NP  Patient Care Team: Carlie Solorzano, Nelda Bucks, NP as PCP - General (Family Medicine)  Extended Emergency Contact Information Primary Emergency Contact: Nolon [place] Mobile Phone: [phone] Relation: Daughter Secondary Emergency Contact: Pinnix,Teressa  [place] [employer] Phone: [phone] Relation: Daughter  Code Status:  Full Code  Goals of care: Advanced Directive information    04/17/2022    3:19 PM  Advanced Directives  Does Patient Have a Medical Advance Directive? Yes  Type of Advance Directive [place]  Does patient want to make changes to medical advance directive? No - Patient declined  Copy of [place] in Chart? No - copy requested     Chief Complaint  Patient presents with   Medical Management of Chronic Issues    6 month follow up.   Review Labs     Review recent labs.   Immunizations    Discuss the need for Covid Booster, and Influenza vaccine.   Health Maintenance    Discuss the need for Eye exam, Diabetic kidney evaluation, and Foot exam.    HPI:  Pt is a 86 y.o. female seen today for 6 month follow up for medical management of chronic diseases. She is here with her daughters who provide HPI information.states patient has been doing well. Appetite is good.  No fall episode since last visit. Recent fasting blood work results reviewed and discussed with daughter during the visit.Her labs unremarkable except Hgb A1C slightly high compared to previous visit 6.2 previous 6.1   Health Maintenance:  She due for COVID-19 and Influenza vaccine   Past Medical History:  Diagnosis Date   Anxiety state, unspecified    Cataract    Depressive disorder, not elsewhere classified    Lumbago    Memory loss    Other and unspecified hyperlipidemia    Rickets, late effect    Tobacco use disorder    Unspecified constipation     Unspecified essential hypertension    Varicose veins of lower extremities    Past Surgical History:  Procedure Laterality Date   CYST EXCISION Bilateral    behind ears   MOUTH SURGERY  04/12/2014    Allergies  Allergen Reactions   Penicillins Swelling and Rash    Tolerate ceftriaxone    Allergies as of 04/17/2022       Reactions   Penicillins Swelling, Rash   Tolerate ceftriaxone        Medication List        Accurate as of April 17, 2022  4:04 PM. If you have any questions, ask your nurse or doctor.          acetaminophen 325 MG tablet Commonly known as: TYLENOL Take 1 tablet (325 mg total) by mouth daily as needed (pain).   alendronate 70 MG tablet Commonly known as: FOSAMAX TAKE 1 TABLET(70 MG) BY MOUTH 1 TIME A WEEK WITH A FULL GLASS OF WATER AND ON AN EMPTY STOMACH   amLODipine 2.5 MG tablet Commonly known as: NORVASC Take 1 tablet (2.5 mg total) by mouth daily.   atorvastatin 20 MG tablet Commonly known as: LIPITOR TAKE 1 TABLET(20 MG) BY MOUTH DAILY   CALCIUM 600 + D PO Take 1 tablet by mouth daily.   lisinopril 5 MG tablet Commonly known as: ZESTRIL TAKE 1 TABLET(5 MG) BY MOUTH DAILY FOR KIDNEY PROTECTION   multivitamin with minerals tablet Take 1 tablet by  mouth daily with supper. Centrum Silver   Namzaric 28-10 MG Cp24 Generic drug: Memantine HCl-Donepezil HCl TAKE 1 CAPSULE BY MOUTH DAILY   nystatin powder Commonly known as: MYCOSTATIN/NYSTOP Apply topically 2 (two) times daily. To groin and abdomen area   nystatin cream Commonly known as: MYCOSTATIN Apply 1 Application topically 2 (two) times daily.   polyethylene glycol 17 g packet Commonly known as: MIRALAX / GLYCOLAX Take 17 g by mouth daily.   PROBIOTIC DAILY PO Take by mouth. Culturelle--otc   QUEtiapine 100 MG tablet Commonly known as: SEROQUEL TAKE 1 TABLET(100 MG) BY MOUTH TWICE DAILY   senna-docusate 8.6-50 MG tablet Commonly known as: Senokot-S Take 1 tablet  by mouth at bedtime.   VITAMIN D3 PO Take 1,000 Units by mouth. Daily-otc        Review of Systems  Constitutional:  Negative for appetite change, chills, fatigue, fever and unexpected weight change.  HENT:  Negative for congestion, dental problem, ear discharge, ear pain, facial swelling, hearing loss, nosebleeds, postnasal drip, rhinorrhea, sinus pressure, sinus pain, sneezing, sore throat, tinnitus and trouble swallowing.   Eyes:  Negative for pain, discharge, redness, itching and visual disturbance.  Respiratory:  Negative for cough, chest tightness, shortness of breath and wheezing.   Cardiovascular:  Negative for chest pain, palpitations and leg swelling.  Gastrointestinal:  Negative for abdominal distention, abdominal pain, blood in stool, constipation, diarrhea, nausea and vomiting.  Endocrine: Negative for cold intolerance, heat intolerance, polydipsia, polyphagia and polyuria.  Genitourinary:  Negative for difficulty urinating, dysuria, flank pain, frequency and urgency.  Musculoskeletal:  Negative for arthralgias, back pain, gait problem, joint swelling, myalgias, neck pain and neck stiffness.  Skin:  Negative for color change, pallor, rash and wound.  Neurological:  Negative for dizziness, syncope, speech difficulty, weakness, light-headedness, numbness and headaches.  Hematological:  Does not bruise/bleed easily.  Psychiatric/Behavioral:  Negative for agitation, behavioral problems, confusion, hallucinations, self-injury, sleep disturbance and suicidal ideas. The patient is not nervous/anxious.        Memory loss     Immunization History  Administered Date(s) Administered   Fluad Quad(high Dose 65+) 02/23/2019, 04/25/2020, 03/14/2021, 04/17/2022   Influenza Whole 04/06/2012   Influenza,inj,Quad PF,6+ Mos 03/01/2013, 06/08/2014, 02/16/2015, 02/01/2016, 03/03/2017, 03/24/2018   PFIZER(Purple Top)SARS-COV-2 Vaccination 07/14/2019, 08/04/2019, 05/10/2020   Pfizer Covid-19  Vaccine Bivalent Booster 75yr & up 06/13/2021   Pneumococcal Conjugate-13 12/15/2014   Pneumococcal Polysaccharide-23 07/10/2011   Td 07/10/2011   Tdap 06/08/2014   Zoster Recombinat (Shingrix) 11/19/2017, 04/12/2018, 05/10/2018, 05/10/2018, 05/12/2018   Pertinent  Health Maintenance Due  Topic Date Due   FOOT EXAM  Never done   OPHTHALMOLOGY EXAM  Never done   HEMOGLOBIN A1C  10/08/2022   INFLUENZA VACCINE  Completed   DEXA SCAN  Completed      02/21/2021    4:06 PM 10/09/2021    9:30 AM 10/16/2021    2:41 PM 02/26/2022    3:25 PM 04/17/2022    3:18 PM  FBelcourtin the past year? 0 0 0 0 0  Was there an injury with Fall? 0 0 0 0 0  Fall Risk Category Calculator 0 0 0 0 0  Fall Risk Category _0   Patient Fall Risk Level _1   Patient at Risk for Falls Due to _2   Fall  risk Follow up _0    Functional Status Survey:    Vitals:   04/17/22 1517  BP: 110/70  Pulse: 82  Resp: 16  Temp: (!) 96 F (35.6 C)  SpO2: 97%  Weight: 170 lb 3.2 oz (77.2 kg)  Height: _1  (1.626 m)   Body mass index is 29.21 kg/m. Physical Exam Vitals reviewed.  Constitutional:      General: She is not in acute distress.    Appearance: Normal appearance. She is overweight. She is not ill-appearing or diaphoretic.  HENT:     Head: Normocephalic.     Right Ear: Tympanic membrane, ear canal and external ear normal. There is no impacted cerumen.     Left Ear: Tympanic membrane, ear canal and external ear normal. There is no impacted cerumen.     Nose: Nose normal. No congestion or rhinorrhea.     Mouth/Throat:     Mouth: Mucous membranes are moist.     Pharynx: Oropharynx is clear. No oropharyngeal exudate or posterior  oropharyngeal erythema.  Eyes:     General: No scleral icterus.       Right eye: No discharge.        Left eye: No discharge.     Extraocular Movements: Extraocular movements intact.     Conjunctiva/sclera: Conjunctivae normal.     Pupils: Pupils are equal, round, and reactive to light.  Neck:     Vascular: No carotid bruit.  Cardiovascular:     Rate and Rhythm: Normal rate and regular rhythm.     Pulses: Normal pulses.     Heart sounds: Normal heart sounds. No murmur heard.    No friction rub. No gallop.  Pulmonary:     Effort: Pulmonary effort is normal. No respiratory distress.     Breath sounds: Normal breath sounds. No wheezing, rhonchi or rales.  Chest:     Chest wall: No tenderness.  Abdominal:     General: Bowel sounds are normal. There is no distension.     Palpations: Abdomen is soft. There is no mass.     Tenderness: There is no abdominal tenderness. There is no right CVA tenderness, left CVA tenderness, guarding or rebound.  Musculoskeletal:        General: No swelling or tenderness. Normal range of motion.     Cervical back: Normal range of motion. No rigidity or tenderness.     Right lower leg: No edema.     Left lower leg: No edema.  Lymphadenopathy:     Cervical: No cervical adenopathy.  Skin:    General: Skin is warm and dry.     Coloration: Skin is not pale.     Findings: No bruising, erythema, lesion or rash.  Neurological:     Mental Status: She is alert and oriented to person, place, and time.     Cranial Nerves: No cranial nerve deficit.     Sensory: No sensory deficit.     Motor: No weakness.     Coordination: Coordination normal.     Gait: Gait normal.  Psychiatric:        Mood and Affect: Mood normal.        Speech: Speech normal.        Behavior: Behavior normal.        Thought Content: Thought content normal.        Cognition and Memory: She exhibits impaired recent memory.  Judgment: Judgment normal.     Labs reviewed: Recent Labs     10/09/21 1013 04/09/22 0944  NA 146 144  K 4.2 3.8  CL 110 106  CO2 25 24  GLUCOSE 105* 90  BUN 17 13  CREATININE 1.16* 1.21*  CALCIUM 9.5 9.5   Recent Labs    10/09/21 1013  AST 12  ALT 5*  BILITOT 0.7  PROT 7.9   Recent Labs    09/04/21 1613 10/09/21 1013 04/09/22 0944  WBC 8.4 7.9 7.2  NEUTROABS 4,108 3,958 3,449  HGB 14.0 13.6 13.8  HCT 42.9 41.7 41.7  MCV 88.3 88.0 87.4  PLT 204 224 182   Lab Results  Component Value Date   TSH 2.22 10/09/2021   Lab Results  Component Value Date   HGBA1C 6.2 (H) 04/09/2022   Lab Results  Component Value Date   CHOL 160 04/09/2022   HDL 64 04/09/2022   LDLCALC 77 04/09/2022   TRIG 102 04/09/2022   CHOLHDL 2.5 04/09/2022    Significant Diagnostic Results in last 30 days:  No results found.  Assessment/Plan 1. Need for influenza vaccination Afebrile  Flut shot administered by CMA no acute reaction reported.  - Flu Vaccine QUAD High Dose(Fluad)  2. Essential hypertension, benign Blood pressure well controlled Continue on lisinopril and amlodipine -Continue on statin - TSH; Future - COMPLETE METABOLIC PANEL WITH GFR; Future - CBC with Differential/Platelet; Future  3. Mixed hyperlipidemia Continue dietary modification and exercise as tolerated -Continue on atorvastatin - Lipid panel; Future  4. Type 2 diabetes mellitus without complication, without long-term current use of insulin (HCC) Lab Results  Component Value Date   HGBA1C 6.2 (H) 04/09/2022  -Currently of medication -Diet controlled - continue Statin for cardiac event prophylaxis - on ACE inhibitor for renal protection  - not up to date on annual eye and  foot exam.monofilament test intact this visit. Daughter to schedule appointment with ophthalmologist. - Microalbumin / creatinine urine ratio - Hemoglobin A1c; Future  5. Mixed Alzheimer's and vascular dementia ([place]) No new behavioral issues reported No weight loss since last  visit Continue with support care -Continue on Seroquel, memantine and donezepil  7. Body mass index 29.0-29.9, adult BMI 29.21 with associated comorbidities hypertension and type 2 diabetes mellitus -Continue with dietary modification and exercise as tolerated  6. Urine frequency Negative exam findings  -Encouraged to increase water intake we will send urine for culture - Urine Culture  Family/ staff Communication: Reviewed plan of care with patient and daughter verbalized understanding  Labs/tests ordered:  - CBC with Differential/Platelet - CMP with eGFR([facility]) - TSH - Hgb A1C - Lipid panel - Urine Culture  Next Appointment : Return in about 6 months (around 10/16/2022) for medical mangement of chronic issues., Fasting labs in 6 months prior to visit.   Sandrea Hughs, NP

## 2022-04-17 NOTE — Patient Instructions (Signed)
-   Bring urine specimen back to office when able to collect specimen.

## 2022-04-23 DIAGNOSIS — E119 Type 2 diabetes mellitus without complications: Secondary | ICD-10-CM | POA: Diagnosis not present

## 2022-04-23 DIAGNOSIS — R350 Frequency of micturition: Secondary | ICD-10-CM | POA: Diagnosis not present

## 2022-04-24 ENCOUNTER — Other Ambulatory Visit: Payer: Medicare PPO

## 2022-04-24 LAB — MICROALBUMIN / CREATININE URINE RATIO
Creatinine, Urine: 200 mg/dL (ref 20–275)
Microalb Creat Ratio: 200 mcg/mg creat — ABNORMAL HIGH (ref <30)
Microalb, Ur: 40 mg/dL

## 2022-04-26 LAB — URINE CULTURE
MICRO NUMBER:: 14193951
SPECIMEN QUALITY:: ADEQUATE

## 2022-04-29 ENCOUNTER — Telehealth: Payer: Self-pay

## 2022-04-29 MED ORDER — SACCHAROMYCES BOULARDII 250 MG PO CAPS: 250.0000 mg | ORAL_CAPSULE | Freq: Two times a day (BID) | ORAL | 0 refills | Status: AC

## 2022-04-29 MED ORDER — CIPROFLOXACIN HCL 500 MG PO TABS: 500.0000 mg | ORAL_TABLET | Freq: Two times a day (BID) | ORAL | 0 refills | Status: AC

## 2022-04-29 NOTE — Telephone Encounter (Signed)
-----   Message from Caesar Bookman, NP sent at 04/29/2022  8:55 AM EST ----- Final urine culture indicates > 100,000 colonies of E.Coli infection.Start on Cipro 500 mg tablet one by mouth twice daily x 7 days Take along with probiotics Florastor 250 mg capsule one by mouth twice daily x 10 days to prevent antibiotics associated diarrhea  - increase fluid intake

## 2022-04-29 NOTE — Telephone Encounter (Signed)
Informed patient daughter of results.  Rx sent to pharmacy.

## 2022-07-12 ENCOUNTER — Emergency Department (HOSPITAL_COMMUNITY): Payer: Medicare PPO

## 2022-07-12 ENCOUNTER — Other Ambulatory Visit: Payer: Self-pay

## 2022-07-12 ENCOUNTER — Encounter (HOSPITAL_COMMUNITY): Payer: Self-pay

## 2022-07-12 ENCOUNTER — Inpatient Hospital Stay (HOSPITAL_COMMUNITY)
Admission: EM | Admit: 2022-07-12 | Discharge: 2022-07-18 | DRG: 871 | Disposition: A | Discharge status: Hospice | Payer: Medicare PPO | Attending: Internal Medicine | Admitting: Internal Medicine

## 2022-07-12 DIAGNOSIS — E119 Type 2 diabetes mellitus without complications: Secondary | ICD-10-CM | POA: Diagnosis present

## 2022-07-12 DIAGNOSIS — I719 Aortic aneurysm of unspecified site, without rupture: Secondary | ICD-10-CM | POA: Diagnosis not present

## 2022-07-12 DIAGNOSIS — A4151 Sepsis due to Escherichia coli [E. coli]: Secondary | ICD-10-CM | POA: Diagnosis not present

## 2022-07-12 DIAGNOSIS — I7103 Dissection of thoracoabdominal aorta: Secondary | ICD-10-CM | POA: Diagnosis present

## 2022-07-12 DIAGNOSIS — Z888 Allergy status to other drugs, medicaments and biological substances status: Secondary | ICD-10-CM

## 2022-07-12 DIAGNOSIS — Z7401 Bed confinement status: Secondary | ICD-10-CM | POA: Diagnosis not present

## 2022-07-12 DIAGNOSIS — I716 Thoracoabdominal aortic aneurysm, without rupture, unspecified: Secondary | ICD-10-CM | POA: Diagnosis not present

## 2022-07-12 DIAGNOSIS — R4182 Altered mental status, unspecified: Secondary | ICD-10-CM | POA: Diagnosis not present

## 2022-07-12 DIAGNOSIS — I71019 Dissection of thoracic aorta, unspecified: Secondary | ICD-10-CM | POA: Diagnosis present

## 2022-07-12 DIAGNOSIS — N281 Cyst of kidney, acquired: Secondary | ICD-10-CM | POA: Diagnosis not present

## 2022-07-12 DIAGNOSIS — N390 Urinary tract infection, site not specified: Secondary | ICD-10-CM | POA: Diagnosis not present

## 2022-07-12 DIAGNOSIS — N179 Acute kidney failure, unspecified: Secondary | ICD-10-CM | POA: Diagnosis not present

## 2022-07-12 DIAGNOSIS — G309 Alzheimer's disease, unspecified: Secondary | ICD-10-CM | POA: Diagnosis present

## 2022-07-12 DIAGNOSIS — R531 Weakness: Secondary | ICD-10-CM | POA: Diagnosis not present

## 2022-07-12 DIAGNOSIS — R6520 Severe sepsis without septic shock: Secondary | ICD-10-CM | POA: Diagnosis present

## 2022-07-12 DIAGNOSIS — F0150 Vascular dementia without behavioral disturbance: Secondary | ICD-10-CM | POA: Diagnosis present

## 2022-07-12 DIAGNOSIS — E872 Acidosis, unspecified: Secondary | ICD-10-CM

## 2022-07-12 DIAGNOSIS — K8020 Calculus of gallbladder without cholecystitis without obstruction: Secondary | ICD-10-CM | POA: Diagnosis not present

## 2022-07-12 DIAGNOSIS — Z515 Encounter for palliative care: Secondary | ICD-10-CM | POA: Diagnosis not present

## 2022-07-12 DIAGNOSIS — K5289 Other specified noninfective gastroenteritis and colitis: Secondary | ICD-10-CM | POA: Diagnosis present

## 2022-07-12 DIAGNOSIS — F0280 Dementia in other diseases classified elsewhere without behavioral disturbance: Secondary | ICD-10-CM | POA: Diagnosis not present

## 2022-07-12 DIAGNOSIS — Z87891 Personal history of nicotine dependence: Secondary | ICD-10-CM | POA: Diagnosis not present

## 2022-07-12 DIAGNOSIS — Z1152 Encounter for screening for COVID-19: Secondary | ICD-10-CM

## 2022-07-12 DIAGNOSIS — Z7983 Long term (current) use of bisphosphonates: Secondary | ICD-10-CM

## 2022-07-12 DIAGNOSIS — Z8249 Family history of ischemic heart disease and other diseases of the circulatory system: Secondary | ICD-10-CM

## 2022-07-12 DIAGNOSIS — N3000 Acute cystitis without hematuria: Secondary | ICD-10-CM | POA: Diagnosis not present

## 2022-07-12 DIAGNOSIS — I10 Essential (primary) hypertension: Secondary | ICD-10-CM | POA: Diagnosis not present

## 2022-07-12 DIAGNOSIS — I7143 Infrarenal abdominal aortic aneurysm, without rupture: Secondary | ICD-10-CM | POA: Diagnosis present

## 2022-07-12 DIAGNOSIS — Z7189 Other specified counseling: Secondary | ICD-10-CM | POA: Diagnosis not present

## 2022-07-12 DIAGNOSIS — E876 Hypokalemia: Secondary | ICD-10-CM | POA: Diagnosis present

## 2022-07-12 DIAGNOSIS — R109 Unspecified abdominal pain: Secondary | ICD-10-CM | POA: Diagnosis not present

## 2022-07-12 DIAGNOSIS — Z66 Do not resuscitate: Secondary | ICD-10-CM | POA: Diagnosis present

## 2022-07-12 DIAGNOSIS — E785 Hyperlipidemia, unspecified: Secondary | ICD-10-CM | POA: Diagnosis present

## 2022-07-12 DIAGNOSIS — I714 Abdominal aortic aneurysm, without rupture, unspecified: Secondary | ICD-10-CM | POA: Diagnosis not present

## 2022-07-12 DIAGNOSIS — A419 Sepsis, unspecified organism: Secondary | ICD-10-CM | POA: Diagnosis not present

## 2022-07-12 DIAGNOSIS — K5900 Constipation, unspecified: Secondary | ICD-10-CM | POA: Diagnosis present

## 2022-07-12 DIAGNOSIS — Z79899 Other long term (current) drug therapy: Secondary | ICD-10-CM

## 2022-07-12 DIAGNOSIS — I959 Hypotension, unspecified: Principal | ICD-10-CM

## 2022-07-12 LAB — RESP PANEL BY RT-PCR (RSV, FLU A&B, COVID)  RVPGX2
Influenza A by PCR: NEGATIVE
Influenza B by PCR: NEGATIVE
Resp Syncytial Virus by PCR: NEGATIVE
SARS Coronavirus 2 by RT PCR: NEGATIVE

## 2022-07-12 LAB — URINALYSIS, ROUTINE W REFLEX MICROSCOPIC
Bilirubin Urine: NEGATIVE
Glucose, UA: NEGATIVE mg/dL
Hgb urine dipstick: NEGATIVE
Ketones, ur: 5 mg/dL — AB
Nitrite: NEGATIVE
Protein, ur: 100 mg/dL — AB
Specific Gravity, Urine: 1.02 (ref 1.005–1.030)
WBC, UA: >50 WBC/hpf (ref 0–5)
pH: 5 (ref 5.0–8.0)

## 2022-07-12 LAB — LACTIC ACID, PLASMA
Lactic Acid, Venous: 6.5 mmol/L (ref 0.5–1.9)
Lactic Acid, Venous: 3.7 mmol/L (ref 0.5–1.9)
Lactic Acid, Venous: 1.3 mmol/L (ref 0.5–1.9)

## 2022-07-12 LAB — CBC
HCT: 40.1 % (ref 36.0–46.0)
Hemoglobin: 12.6 g/dL (ref 12.0–15.0)
MCH: 28.6 pg (ref 26.0–34.0)
MCHC: 31.4 g/dL (ref 30.0–36.0)
MCV: 91.1 fL (ref 80.0–100.0)
Platelets: 115 10*3/uL — ABNORMAL LOW (ref 150–400)
RBC: 4.4 MIL/uL (ref 3.87–5.11)
RDW: 14.7 % (ref 11.5–15.5)
WBC: 13.7 10*3/uL — ABNORMAL HIGH (ref 4.0–10.5)
nRBC: 0 % (ref 0.0–0.2)

## 2022-07-12 LAB — BASIC METABOLIC PANEL WITH GFR
Anion gap: 17 — ABNORMAL HIGH (ref 5–15)
BUN: 29 mg/dL — ABNORMAL HIGH (ref 8–23)
CO2: 20 mmol/L — ABNORMAL LOW (ref 22–32)
Calcium: 8.8 mg/dL — ABNORMAL LOW (ref 8.9–10.3)
Chloride: 102 mmol/L (ref 98–111)
Creatinine, Ser: 2.41 mg/dL — ABNORMAL HIGH (ref 0.44–1.00)
GFR, Estimated: 19 mL/min — ABNORMAL LOW
Glucose, Bld: 125 mg/dL — ABNORMAL HIGH (ref 70–99)
Potassium: 4.1 mmol/L (ref 3.5–5.1)
Sodium: 139 mmol/L (ref 135–145)

## 2022-07-12 LAB — MAGNESIUM: Magnesium: 1.4 mg/dL — ABNORMAL LOW (ref 1.7–2.4)

## 2022-07-12 LAB — CBG MONITORING, ED: Glucose-Capillary: 113 mg/dL — ABNORMAL HIGH (ref 70–99)

## 2022-07-12 LAB — TROPONIN I (HIGH SENSITIVITY)
Troponin I (High Sensitivity): 24 ng/L — ABNORMAL HIGH
Troponin I (High Sensitivity): 21 ng/L — ABNORMAL HIGH

## 2022-07-12 MED ORDER — SODIUM CHLORIDE 0.9 % IV BOLUS
500.0000 mL | Freq: Once | INTRAVENOUS | Status: AC
  Administered 2022-07-12: 500 mL via INTRAVENOUS

## 2022-07-12 MED ORDER — MEMANTINE HCL-DONEPEZIL HCL ER 28-10 MG PO CP24: 1.0000 | ORAL_CAPSULE | Freq: Every day | ORAL | Status: DC

## 2022-07-12 MED ORDER — BISACODYL 10 MG RE SUPP: 10.0000 mg | Freq: Every day | RECTAL | Status: DC | PRN

## 2022-07-12 MED ORDER — AMLODIPINE BESYLATE 5 MG PO TABS
2.5000 mg | ORAL_TABLET | Freq: Two times a day (BID) | ORAL | Status: DC
  Administered 2022-07-13 – 2022-07-18 (×12): 2.5 mg via ORAL
  Filled 2022-07-12 (×12): qty 1

## 2022-07-12 MED ORDER — ACETAMINOPHEN 325 MG PO TABS
650.0000 mg | ORAL_TABLET | Freq: Four times a day (QID) | ORAL | Status: DC | PRN
  Administered 2022-07-15: 650 mg via ORAL
  Filled 2022-07-12: qty 2

## 2022-07-12 MED ORDER — ENOXAPARIN SODIUM 30 MG/0.3ML IJ SOSY
30.0000 mg | PREFILLED_SYRINGE | INTRAMUSCULAR | Status: DC
  Administered 2022-07-13 – 2022-07-17 (×5): 30 mg via SUBCUTANEOUS
  Filled 2022-07-12 (×5): qty 0.3

## 2022-07-12 MED ORDER — LACTATED RINGERS IV SOLN: INTRAVENOUS | Status: DC

## 2022-07-12 MED ORDER — ONDANSETRON HCL 4 MG/2ML IJ SOLN: 4.0000 mg | Freq: Four times a day (QID) | INTRAMUSCULAR | Status: DC | PRN

## 2022-07-12 MED ORDER — SODIUM CHLORIDE 0.9 % IV BOLUS: 1000.0000 mL | Freq: Once | INTRAVENOUS | Status: DC

## 2022-07-12 MED ORDER — SENNOSIDES-DOCUSATE SODIUM 8.6-50 MG PO TABS
1.0000 | ORAL_TABLET | Freq: Every day | ORAL | Status: DC
  Administered 2022-07-13 – 2022-07-17 (×6): 1 via ORAL
  Filled 2022-07-12 (×6): qty 1

## 2022-07-12 MED ORDER — ONDANSETRON HCL 4 MG PO TABS: 4.0000 mg | ORAL_TABLET | Freq: Four times a day (QID) | ORAL | Status: DC | PRN

## 2022-07-12 MED ORDER — LACTATED RINGERS IV BOLUS (SEPSIS): 1000.0000 mL | Freq: Once | INTRAVENOUS | Status: DC

## 2022-07-12 MED ORDER — METRONIDAZOLE 500 MG/100ML IV SOLN
500.0000 mg | Freq: Once | INTRAVENOUS | Status: AC
  Administered 2022-07-13: 500 mg via INTRAVENOUS
  Filled 2022-07-12: qty 100

## 2022-07-12 MED ORDER — ACETAMINOPHEN 650 MG RE SUPP: 650.0000 mg | Freq: Four times a day (QID) | RECTAL | Status: DC | PRN

## 2022-07-12 MED ORDER — SODIUM CHLORIDE 0.9 % IV SOLN
2.0000 g | INTRAVENOUS | Status: DC
  Administered 2022-07-12: 2 g via INTRAVENOUS
  Filled 2022-07-12: qty 20

## 2022-07-12 MED ORDER — ADULT MULTIVITAMIN W/MINERALS CH
1.0000 | ORAL_TABLET | Freq: Every day | ORAL | Status: DC
  Administered 2022-07-13 – 2022-07-17 (×5): 1 via ORAL
  Filled 2022-07-12 (×5): qty 1

## 2022-07-12 MED ORDER — ATORVASTATIN CALCIUM 10 MG PO TABS
20.0000 mg | ORAL_TABLET | Freq: Every day | ORAL | Status: DC
  Administered 2022-07-13 – 2022-07-18 (×6): 20 mg via ORAL
  Filled 2022-07-12 (×6): qty 2

## 2022-07-12 MED ORDER — HYDRALAZINE HCL 20 MG/ML IJ SOLN
5.0000 mg | INTRAMUSCULAR | Status: DC | PRN
  Filled 2022-07-12: qty 1

## 2022-07-12 MED ORDER — LACTATED RINGERS IV BOLUS: 500.0000 mL | Freq: Once | INTRAVENOUS | Status: DC

## 2022-07-12 MED ORDER — IOHEXOL 350 MG/ML SOLN
80.0000 mL | Freq: Once | INTRAVENOUS | Status: AC | PRN
  Administered 2022-07-12: 80 mL via INTRAVENOUS

## 2022-07-12 MED ORDER — MAGNESIUM SULFATE 2 GM/50ML IV SOLN
2.0000 g | Freq: Once | INTRAVENOUS | Status: AC
  Administered 2022-07-12: 2 g via INTRAVENOUS
  Filled 2022-07-12: qty 50

## 2022-07-12 MED ORDER — QUETIAPINE FUMARATE 100 MG PO TABS
100.0000 mg | ORAL_TABLET | Freq: Two times a day (BID) | ORAL | Status: DC
  Administered 2022-07-13 – 2022-07-18 (×12): 100 mg via ORAL
  Filled 2022-07-12 (×12): qty 1

## 2022-07-12 MED ORDER — POLYETHYLENE GLYCOL 3350 17 G PO PACK
17.0000 g | PACK | Freq: Every day | ORAL | Status: DC
  Administered 2022-07-14 – 2022-07-17 (×3): 17 g via ORAL
  Filled 2022-07-12 (×3): qty 1

## 2022-07-12 NOTE — ED Notes (Signed)
Patient transported to CT 

## 2022-07-12 NOTE — H&P (Signed)
History and Physical    Patient: Carol [name]:[id number] DOB: 04-Jun-1936 DOA: 07/12/2022 DOS: the patient was seen and examined on 07/12/2022 PCP: NgetichNelda Bucks, NP  Patient coming from: Home  Chief Complaint:  Chief Complaint  Patient presents with   Weakness   HPI: Carol [name] is a 87 y.o. female with medical history significant of Dementia, HTN, constipation.  In to ED today with several days of being unable to have a BM.  Laxatives and enemas at home.  During ED work up pt also found to have: 7+ cm AAA and TAAA. Type B aortic Dissection. Initial hypotension AKI Lactic acidosis.  Patient is pleasantly confused, denies any complaints.  Review of Systems: unable to review all systems due to the inability of the patient to answer questions. Past Medical History:  Diagnosis Date   Anxiety state, unspecified    Cataract    Depressive disorder, not elsewhere classified    Lumbago    Memory loss    Other and unspecified hyperlipidemia    Rickets, late effect    Tobacco use disorder    Unspecified constipation    Unspecified essential hypertension    Varicose veins of lower extremities    Past Surgical History:  Procedure Laterality Date   CYST EXCISION Bilateral    behind ears   MOUTH SURGERY  04/12/2014   Social History:  reports that she quit smoking about 5 years ago. Her smoking use included cigarettes. She has never used smokeless tobacco. She reports that she does not drink alcohol and does not use drugs.  Allergies  Allergen Reactions   Penicillins Swelling and Rash    Tolerate ceftriaxone    Family History  Problem Relation Age of Onset   Heart disease Father     Prior to Admission medications   Medication Sig Start Date End Date Taking? Authorizing Provider  acetaminophen (TYLENOL) 325 MG tablet Take 1 tablet (325 mg total) by mouth daily as needed (pain). 07/26/20   Reed, Tiffany L, DO  alendronate (FOSAMAX) 70 MG tablet TAKE 1  TABLET(70 MG) BY MOUTH 1 TIME A WEEK WITH A FULL GLASS OF WATER AND ON AN EMPTY STOMACH 12/26/21   Ngetich, Dinah C, NP  amLODipine (NORVASC) 2.5 MG tablet Take 1 tablet (2.5 mg total) by mouth daily. 09/04/21   Ngetich, Dinah C, NP  atorvastatin (LIPITOR) 20 MG tablet TAKE 1 TABLET(20 MG) BY MOUTH DAILY 09/02/21   Ngetich, Dinah C, NP  Calcium Carb-Cholecalciferol (CALCIUM 600 + D PO) Take 1 tablet by mouth daily.    [provider]  Cholecalciferol (VITAMIN D3 PO) Take 1,000 Units by mouth. Daily-otc    [provider]  lisinopril (ZESTRIL) 5 MG tablet TAKE 1 TABLET(5 MG) BY MOUTH DAILY FOR KIDNEY PROTECTION 02/20/22   Ngetich, Dinah C, NP  Memantine HCl-Donepezil HCl (NAMZARIC) 28-10 MG CP24 TAKE 1 CAPSULE BY MOUTH DAILY 09/19/21   Ngetich, Dinah C, NP  Multiple Vitamins-Minerals (MULTIVITAMIN WITH MINERALS) tablet Take 1 tablet by mouth daily with supper. Centrum Silver    [provider]  nystatin (MYCOSTATIN/NYSTOP) powder Apply topically 2 (two) times daily. To groin and abdomen area 07/26/20   Reed, Tiffany L, DO  nystatin cream (MYCOSTATIN) Apply 1 Application topically 2 (two) times daily. 02/26/22   Ngetich, Dinah C, NP  polyethylene glycol (MIRALAX / GLYCOLAX) packet Take 17 g by mouth daily. 07/28/18   Nche, Charlene Brooke, NP  Probiotic Product (PROBIOTIC DAILY PO) Take by mouth. Culturelle--otc  [provider]  QUEtiapine (SEROQUEL) 100 MG tablet TAKE 1 TABLET(100 MG) BY MOUTH TWICE DAILY 04/07/22   Ngetich, Dinah C, NP  senna-docusate (SENOKOT-S) 8.6-50 MG tablet Take 1 tablet by mouth at bedtime. 10/16/21   Ngetich, Nelda Bucks, NP    Physical Exam: Vitals:   07/12/22 1840 07/12/22 2025 07/12/22 2030 07/12/22 2045  BP: (!) 142/98 (!) 146/79 (!) 132/95 (!) 149/92  Pulse: 86 79 82 81  Resp: (!) 21 14 (!) 25 18  Temp:      TempSrc:      SpO2: 98% 98% 94% 95%  Weight:      Height:       Constitutional: NAD, calm, comfortable, elderly,  frail Respiratory: clear to auscultation bilaterally, no wheezing, no crackles. Normal respiratory effort. No accessory muscle use.  Cardiovascular: Regular rate and rhythm, no murmurs / rubs / gallops. No extremity edema. 2+ pedal pulses. No carotid bruits.  Abdomen: no tenderness, soft Neurologic: MAE, Grossly non-focal Psychiatric: AAOx1, not somnolent for me  Data Reviewed:    COVID, FLU, RSV negative  Urinalysis    Component Value Date/Time   COLORURINE AMBER (A) 07/12/2022 1444   APPEARANCEUR CLOUDY (A) 07/12/2022 1444   APPEARANCEUR Cloudy (A) 06/01/2015 1225   LABSPEC 1.020 07/12/2022 1444   PHURINE 5.0 07/12/2022 1444   GLUCOSEU NEGATIVE 07/12/2022 1444   HGBUR NEGATIVE 07/12/2022 1444   BILIRUBINUR NEGATIVE 07/12/2022 1444   BILIRUBINUR Neg 11/11/2017 1500   BILIRUBINUR Negative 06/01/2015 1225   KETONESUR 5 (A) 07/12/2022 1444   PROTEINUR 100 (A) 07/12/2022 1444   UROBILINOGEN 0.2 11/11/2017 1500   UROBILINOGEN 0.2 10/26/2010 1137   NITRITE NEGATIVE 07/12/2022 1444   LEUKOCYTESUR MODERATE (A) 07/12/2022 1444    Lactate 6.5 -> 3.7  Mg 1.4 (replaced in ED)     Latest Ref Rng & Units 07/12/2022    2:44 PM 04/09/2022    9:44 AM 10/09/2021   10:13 AM  CBC  WBC 4.0 - 10.5 K/uL 13.7  7.2  7.9   Hemoglobin 12.0 - 15.0 g/dL 12.6  13.8  13.6   Hematocrit 36.0 - 46.0 % 40.1  41.7  41.7   Platelets 150 - 400 K/uL 115  182  224         Latest Ref Rng & Units 07/12/2022    2:44 PM 04/09/2022    9:44 AM 10/09/2021   10:13 AM  BMP  Glucose 70 - 99 mg/dL 125  90  105   BUN 8 - 23 mg/dL 29  13  17    Creatinine 0.44 - 1.00 mg/dL 2.41  1.21  1.16   BUN/Creat Ratio 6 - 22 (calc)  11  15   Sodium 135 - 145 mmol/L 139  144  146   Potassium 3.5 - 5.1 mmol/L 4.1  3.8  4.2   Chloride 98 - 111 mmol/L 102  106  110   CO2 22 - 32 mmol/L 20  24  25    Calcium 8.9 - 10.3 mg/dL 8.8  9.5  9.5      CTA aorta: IMPRESSION: 1. Aortic atherosclerosis with multifocal aneurysmal  dilatation of the thoracic and abdominal aorta. There is aneurysmal dilatation of the aortic arch measuring up to 7.2 cm with a partially calcified dissection flap originating at the origin of the left subclavian artery. A small penetrating ulcer is also noted in this region. Aneurysmal dilatation of the ascending aorta measuring 4.2 cm and descending aorta measuring 5.3 cm. Partially calcified dissection  flap is noted in the descending thoracic aorta. Cardiothoracic surgery consultation recommended due to increased risk of rupture for arch aneurysm ? 5.5 cm. This recommendation follows 2010 ACCF/AHA/AATS/ACR/ASA/SCA/SCAI/SIR/STS/SVM Guidelines for the Diagnosis and Management of Patients With Thoracic Aortic Disease. Circulation. 2010; 121: D326-Z124. Aortic aneurysm NOS (ICD10-I71.9) 2. Multifocal aneurysmal dilatation of the abdominal aorta measuring up to 5.8 cm in the proximal abdominal aorta. Atherosclerotic calcification of the common iliac arteries bilaterally with aneurysmal dilatation of the common iliac arteries measuring up to 2.7 cm on the left. Recommend referral to a vascular specialist. This recommendation follows ACR consensus guidelines: White Paper of the ACR Incidental Findings Committee II on Vascular Findings. J Am Coll Radiol 2013; 10:789-794. 3. 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 4. Moderate-to-severe stenosis involving the origins of the external iliac arteries bilaterally. 5. Large amount of stool in the rectum with rectal wall thickening and perirectal fat stranding, suggesting stercoral colitis. 6. Cardiomegaly with coronary artery calcifications. 7. Remaining chronic findings as described above.    Assessment and Plan: * Sepsis secondary to UTI ([place]) Technically only 1/4 SIRS criteria at this point, but lactic acidosis + hypotension on presentation + UTI. Treated as sepsis. Sepsis pathway Rocephin Serial lactates -> trending down and  BP up after IVF Tele monitor Cultures pending  AKI (acute kidney injury) ([place]) Likely pre-renal / ATN in setting of initial BPs in the 70s, poor PO intake. IVF Strict intake and output Repeat BMP in AM  Aortic aneurysm [facility]) Not candidate for surgery per CVTS and Dr. Virl Cagey. Is at high risk for rupture given size. Pt family interested in pal care discussion, not pushing for surgery.  Aortic dissection distal to left subclavian [facility]) New diagnosis today; however, appears old and calcified on CT images per rads and vascular surgery.  Dr. Unk Lightning doesn't think this is an acute issue.  Constipation Senna-s Scheduled Dulcolax PRN EDP going to perform disimpaction.  Essential hypertension, benign Pt with initial hypotension into the 70s in ED; however, this rapidly improved after IVF bolus, now SBP 170. Dont [name] to allow high BPs in this patient given large AAA at high risk for rupture. Cont home norvasc Hold home lisinopril in setting of AKI PRN hydralazine for SBP > 160 or DBP > 100  Mixed Alzheimer's and vascular dementia ([place]) Pt with fairly advanced dementia at baseline. Cont home dementia meds Family confirmed DNR status to EDP. Pal care consulted at family request.      Advance Care Planning:   Code Status: DNR  Consults: Dr. Unk Lightning (vasc surg), Pal care  Family Communication: Daughter at bedside  Severity of Illness: The appropriate patient status for this patient is INPATIENT. Inpatient status is judged to be reasonable and necessary in order to provide the required intensity of service to ensure the patient's safety. The patient's presenting symptoms, physical exam findings, and initial radiographic and laboratory data in the context of their chronic comorbidities is felt to place them at high risk for further clinical deterioration. Furthermore, it is not anticipated that the patient will be medically stable for discharge from the hospital within 2 midnights of  admission.   * I certify that at the point of admission it is my clinical judgment that the patient will require inpatient hospital care spanning beyond 2 midnights from the point of admission due to high intensity of service, high risk for further deterioration and high frequency of surveillance required.*  Author: Etta Quill., DO 07/12/2022 10:48 PM  For on call review [email or web address].

## 2022-07-12 NOTE — Assessment & Plan Note (Addendum)
Pt with initial hypotension into the 70s in ED; however, this rapidly improved after IVF bolus, now SBP 170. Dont want to allow high BPs in this patient given large AAA at high risk for rupture. Cont home norvasc Hold home lisinopril in setting of AKI PRN hydralazine for SBP > 160 or DBP > 100

## 2022-07-12 NOTE — ED Triage Notes (Addendum)
Pt arrived via [facility] from home for weakness, lethargy, decreased oral intakex1wk. Per EMS pt initial bp was 79 systolic. Pt is alert. When I asked pt if she knew where she was or her name, she just keeps saying IDK.  Daughter told EMS, she believes pt is impacted with stool and they tried OTC stuff including enema, but it hasn't helped.

## 2022-07-12 NOTE — Assessment & Plan Note (Signed)
Likely pre-renal / ATN in setting of initial BPs in the 70s, poor PO intake. IVF Strict intake and output Repeat BMP in AM

## 2022-07-12 NOTE — Assessment & Plan Note (Addendum)
Pt with fairly advanced dementia at baseline. Cont home dementia meds Family confirmed DNR status to EDP. Pal care consulted at family request.

## 2022-07-12 NOTE — Assessment & Plan Note (Addendum)
Not candidate for surgery per CVTS and Dr. Virl Cagey. Is at high risk for rupture given size. Pt family interested in pal care discussion, not pushing for surgery.

## 2022-07-12 NOTE — ED Notes (Signed)
I'm unable to obtain blood from pt. I notified phlebotomy and she said she will try to obtain the blood

## 2022-07-12 NOTE — Assessment & Plan Note (Addendum)
Technically only 1/4 SIRS criteria at this point, but lactic acidosis + hypotension on presentation + UTI. Treated as sepsis. Sepsis pathway Rocephin Serial lactates -> trending down and BP up after IVF Tele monitor Cultures pending

## 2022-07-12 NOTE — ED Provider Notes (Incomplete)
[facility] [place] Provider Note   CSN: [id number] Arrival date & time: 07/12/22  1424     History  Chief Complaint  Patient presents with  . Weakness    Carol [name] is a 87 y.o. female.   Weakness    87 year old female with medical history significant for Alzheimer's and vascular dementia, HTN, HLD, constipation, UTIs and sepsis, DM2 who presents to the emergency department with no bowel movement for the past week, fatigue, decline in mental status.  The history is provided by the patient's daughter who was present bedside as the patient was in provide history of present illness due to her mental status and dementia.  Family notes that the patient has had decreased oral intake all throughout the past week.  They did not check her temperature at home.  They feel like she has been increasingly fatigued, lethargic, slight change in mental status from her baseline with more somnolence noted.  They thought she had a fecal impaction and they tried an at home enema without success.  She has had no nausea or vomiting but has not been eating.  EMS was called out due to her change in mental status today and lethargic status that she was found to be hypotensive with initial blood pressure of 79 systolic.  Home Medications Prior to Admission medications   Medication Sig Start Date End Date Taking? Authorizing Provider  acetaminophen (TYLENOL) 325 MG tablet Take 1 tablet (325 mg total) by mouth daily as needed (pain). 07/26/20  Yes Reed, Tiffany L, DO  alendronate (FOSAMAX) 70 MG tablet TAKE 1 TABLET(70 MG) BY MOUTH 1 TIME A WEEK WITH A FULL GLASS OF WATER AND ON AN EMPTY STOMACH Patient taking differently: 70 mg once a week. TAKE 1 TABLET(70 MG) BY MOUTH 1 TIME A WEEK WITH A FULL GLASS OF WATER AND ON AN EMPTY STOMACH Thursday 12/26/21  Yes Ngetich, Dinah C, NP  amLODipine (NORVASC) 2.5 MG tablet Take 1 tablet (2.5 mg total) by mouth daily. Patient  taking differently: Take 2.5 mg by mouth in the morning and at bedtime. 09/04/21  Yes Ngetich, Dinah C, NP  atorvastatin (LIPITOR) 20 MG tablet TAKE 1 TABLET(20 MG) BY MOUTH DAILY Patient taking differently: Take 20 mg by mouth daily. 09/02/21  Yes Ngetich, Dinah C, NP  lisinopril (ZESTRIL) 5 MG tablet TAKE 1 TABLET(5 MG) BY MOUTH DAILY FOR KIDNEY PROTECTION Patient taking differently: Take 5 mg by mouth daily. 02/20/22  Yes Ngetich, Dinah C, NP  Memantine HCl-Donepezil HCl (NAMZARIC) 28-10 MG CP24 TAKE 1 CAPSULE BY MOUTH DAILY Patient taking differently: Take 1 capsule by mouth daily. 09/19/21  Yes Ngetich, Dinah C, NP  Multiple Vitamins-Minerals (MULTIVITAMIN WITH MINERALS) tablet Take 1 tablet by mouth daily with supper. Centrum Silver   Yes [provider]  nystatin (MYCOSTATIN/NYSTOP) powder Apply topically 2 (two) times daily. To groin and abdomen area Patient taking differently: Apply 1 Application topically 2 (two) times daily as needed (for redness or rash to groin and abdominal area). 07/26/20  Yes Reed, Tiffany L, DO  nystatin cream (MYCOSTATIN) Apply 1 Application topically 2 (two) times daily. Patient taking differently: Apply 1 Application topically 2 (two) times daily as needed (for redness or rash to groin and abdominal area). 02/26/22  Yes Ngetich, Dinah C, NP  polyethylene glycol (MIRALAX / GLYCOLAX) packet Take 17 g by mouth daily. Patient taking differently: Take 17 g by mouth daily as needed for mild constipation. 07/28/18  Yes  Nche, Bonna Gainsharlotte Lum, NP  Probiotic Product (PROBIOTIC DAILY PO) Take by mouth. Culturelle--otc   Yes [provider]  QUEtiapine (SEROQUEL) 100 MG tablet TAKE 1 TABLET(100 MG) BY MOUTH TWICE DAILY Patient taking differently: Take 100 mg by mouth 2 (two) times daily. 04/07/22  Yes Ngetich, Dinah C, NP  senna-docusate (SENOKOT-S) 8.6-50 MG tablet Take 1 tablet by mouth at bedtime. Patient taking differently: Take 1 tablet by mouth at bedtime as  needed for mild constipation. 10/16/21  Yes Ngetich, Dinah C, NP      Allergies    Penicillins    Review of Systems   Review of Systems  Unable to perform ROS: Dementia  Neurological:  Positive for weakness.    Physical Exam Updated Vital Signs BP (!) 149/92   Pulse 81   Temp 97.9 F (36.6 C) (Axillary)   Resp 18   Ht 5\' 4"  (1.626 m)   Wt 77.2 kg   SpO2 95%   BMI 29.21 kg/m  Physical Exam Vitals and nursing note reviewed. Exam conducted with a chaperone present.  Constitutional:      General: She is not in acute distress.    Appearance: She is well-developed.     Comments: GCS 13, somnolent, arousable, AAOx1  HENT:     Head: Normocephalic and atraumatic.     Mouth/Throat:     Mouth: Mucous membranes are dry.  Eyes:     Conjunctiva/sclera: Conjunctivae normal.  Cardiovascular:     Rate and Rhythm: Normal rate and regular rhythm.  Pulmonary:     Effort: Pulmonary effort is normal. No respiratory distress.     Breath sounds: Normal breath sounds.  Abdominal:     Palpations: Abdomen is soft.     Tenderness: There is no abdominal tenderness.  Genitourinary:    Comments: Rectal exam without stool in the rectal vault. No melena or hematochezia Musculoskeletal:        General: No swelling.     Cervical back: Neck supple.  Skin:    General: Skin is warm and dry.     Capillary Refill: Capillary refill takes less than 2 seconds.  Neurological:     Mental Status: She is alert.  Psychiatric:        Mood and Affect: Mood normal.     ED Results / Procedures / Treatments   Labs (all labs ordered are listed, but only abnormal results are displayed) Labs Reviewed  BASIC METABOLIC PANEL - Abnormal; Notable for the following components:      Result Value   CO2 20 (*)    Glucose, Bld 125 (*)    BUN 29 (*)    Creatinine, Ser 2.41 (*)    Calcium 8.8 (*)    GFR, Estimated 19 (*)    Anion gap 17 (*)    All other components within normal limits  CBC - Abnormal; Notable  for the following components:   WBC 13.7 (*)    Platelets 115 (*)    All other components within normal limits  URINALYSIS, ROUTINE W REFLEX MICROSCOPIC - Abnormal; Notable for the following components:   Color, Urine AMBER (*)    APPearance CLOUDY (*)    Ketones, ur 5 (*)    Protein, ur 100 (*)    Leukocytes,Ua MODERATE (*)    Bacteria, UA MANY (*)    All other components within normal limits  LACTIC ACID, PLASMA - Abnormal; Notable for the following components:   Lactic Acid, Venous 6.5 (*)  All other components within normal limits  LACTIC ACID, PLASMA - Abnormal; Notable for the following components:   Lactic Acid, Venous 3.7 (*)    All other components within normal limits  MAGNESIUM - Abnormal; Notable for the following components:   Magnesium 1.4 (*)    All other components within normal limits  CBG MONITORING, ED - Abnormal; Notable for the following components:   Glucose-Capillary 113 (*)    All other components within normal limits  TROPONIN I (HIGH SENSITIVITY) - Abnormal; Notable for the following components:   Troponin I (High Sensitivity) 24 (*)    All other components within normal limits  TROPONIN I (HIGH SENSITIVITY) - Abnormal; Notable for the following components:   Troponin I (High Sensitivity) 21 (*)    All other components within normal limits  RESP PANEL BY RT-PCR (RSV, FLU A&B, COVID)  RVPGX2  CULTURE, BLOOD (ROUTINE X 2)  CULTURE, BLOOD (ROUTINE X 2)  URINE CULTURE  LACTIC ACID, PLASMA  LACTIC ACID, PLASMA  CBC  COMPREHENSIVE METABOLIC PANEL    EKG EKG Interpretation  Date/Time:  Saturday July 12 2022 14:36:09 EST Ventricular Rate:  91 PR Interval:  172 QRS Duration: 90 QT Interval:  353 QTC Calculation: 435 R Axis:   6 Text Interpretation: Sinus rhythm Probable left atrial enlargement Inferior infarct, old Abnormal lateral Q waves Confirmed by Regan Lemming ([id number]) on 07/12/2022 4:06:49 PM  Radiology CT Angio Chest/Abd/Pel for  Dissection W and/or Wo Contrast  Addendum Date: 07/12/2022   ADDENDUM REPORT: 07/12/2022 21:55 ADDENDUM: Critical Value/emergent results were called by telephone at the time of interpretation on 07/12/2022 at 9:53 pm to provider Regan Lemming , who verbally acknowledged these results. Electronically Signed   By: Brett Fairy M.D.   On: 07/12/2022 21:55   Result Date: 07/12/2022 CLINICAL DATA:  Acute aortic syndrome suspected. Thoracoabdominal aortic aneurysm follow-up. EXAM: CT ANGIOGRAPHY CHEST, ABDOMEN AND PELVIS TECHNIQUE: Non-contrast CT of the chest was initially obtained. Multidetector CT imaging through the chest, abdomen and pelvis was performed using the standard protocol during bolus administration of intravenous contrast. Multiplanar reconstructed images and MIPs were obtained and reviewed to evaluate the vascular anatomy. RADIATION DOSE REDUCTION: This exam was performed according to the departmental dose-optimization program which includes automated exposure control, adjustment of the mA and/or kV according to patient size and/or use of iterative reconstruction technique. CONTRAST:  74mL OMNIPAQUE IOHEXOL 350 MG/ML SOLN COMPARISON:  07/12/2022. FINDINGS: CTA CHEST FINDINGS Cardiovascular: Heart is enlarged and there is no pericardial effusion. Three-vessel coronary artery calcifications are seen. There is atherosclerotic calcification of the aorta with a calcified dissection flap originating in the proximal aortic arch at the origin of the left subclavian artery. Calcified dissection flap is also noted in the mid to distal abdominal aorta. There is aneurysmal dilatation of the ascending aorta measuring 4.2 cm. There is aneurysmal dilatation of the aortic arch measuring up to 7.2 cm there is aneurysmal dilatation of the descending thoracic aorta measuring 5.3 cm. A penetrating ulceration is noted at the aortic arch. There is a focal aneurysm of the ascending aorta measuring 2.7 cm. Mediastinum/Nodes: No  mediastinal, hilar, or axillary lymphadenopathy. The thyroid gland, trachea, and esophagus are within normal limits. Lungs/Pleura: Paraseptal and centrilobular emphysematous changes are present in the lungs. Atelectasis is present bilaterally. No effusion or pneumothorax. Musculoskeletal: Degenerative changes are present in the thoracic spine. No acute or suspicious osseous abnormality. Review of the MIP images confirms the above findings. CTA ABDOMEN AND PELVIS FINDINGS VASCULAR  Aorta: Aortic atherosclerosis with fusiform aneurysmal dilatation of the proximal abdominal aorta measuring measuring 5.8 x 5.5 cm. There is aneurysmal dilatation of the distal abdominal aorta measuring 3.4 x 3.2 cm. Celiac: Patent without evidence of aneurysm, dissection, vasculitis or significant stenosis. SMA: Patent without evidence of aneurysm, dissection, vasculitis or significant stenosis. Renals: Both renal arteries are patent without evidence of aneurysm, dissection, vasculitis, fibromuscular dysplasia or significant stenosis. IMA: Patent. Inflow: Atherosclerotic calcification with mural thrombus and aneurysmal dilatation of the common iliac arteries measuring 2.2 cm on the right and 2.7 cm on the left. There is aneurysmal dilatation of the proximal internal iliac artery on the left with thrombus and occlusion of the external iliac artery on the left. There is moderate-to-severe stenosis of the proximal external iliac arteries bilaterally. Veins: No obvious venous abnormality within the limitations of this arterial phase study. Review of the MIP images confirms the above findings. NON-VASCULAR Hepatobiliary: Hypodensities are present in the liver, most likely representing cysts or hemangiomas. No biliary ductal dilatation. A small stone is present in the gallbladder. Pancreas: Unremarkable. No pancreatic ductal dilatation or surrounding inflammatory changes. Spleen: Normal in size without focal abnormality. Adrenals/Urinary Tract:  The adrenal glands are within normal limits. Renal cysts are present bilaterally. The kidneys enhance symmetrically. No renal calculus or hydronephrosis. The bladder is unremarkable. Stomach/Bowel: Stomach is within normal limits. Appendix appears normal. No evidence of bowel wall thickening, distention, or inflammatory changes. No free air or pneumatosis. Large amount of retained stool is present in the rectum with rectal wall thickening and perirectal fat stranding. Lymphatic: No abdominal or pelvic lymphadenopathy. Reproductive: Uterus and bilateral adnexa are unremarkable. Other: No abdominopelvic ascites. Musculoskeletal: Degenerative changes are present in the lumbar spine and right hip. No acute osseous abnormality. Review of the MIP images confirms the above findings. IMPRESSION: 1. Aortic atherosclerosis with multifocal aneurysmal dilatation of the thoracic and abdominal aorta. There is aneurysmal dilatation of the aortic arch measuring up to 7.2 cm with a partially calcified dissection flap originating at the origin of the left subclavian artery. A small penetrating ulcer is also noted in this region. Aneurysmal dilatation of the ascending aorta measuring 4.2 cm and descending aorta measuring 5.3 cm. Partially calcified dissection flap is noted in the descending thoracic aorta. Cardiothoracic surgery consultation recommended due to increased risk of rupture for arch aneurysm ? 5.5 cm. This recommendation follows 2010 ACCF/AHA/AATS/ACR/ASA/SCA/SCAI/SIR/STS/SVM Guidelines for the Diagnosis and Management of Patients With Thoracic Aortic Disease. Circulation. 2010; 121: S063-K160. Aortic aneurysm NOS (ICD10-I71.9) 2. Multifocal aneurysmal dilatation of the abdominal aorta measuring up to 5.8 cm in the proximal abdominal aorta. Atherosclerotic calcification of the common iliac arteries bilaterally with aneurysmal dilatation of the common iliac arteries measuring up to 2.7 cm on the left. Recommend referral to  a vascular specialist. This recommendation follows ACR consensus guidelines: White Paper of the ACR Incidental Findings Committee II on Vascular Findings. J Am Coll Radiol 2013; 10:789-794. 3. 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 4. Moderate-to-severe stenosis involving the origins of the external iliac arteries bilaterally. 5. Large amount of stool in the rectum with rectal wall thickening and perirectal fat stranding, suggesting stercoral colitis. 6. Cardiomegaly with coronary artery calcifications. 7. Remaining chronic findings as described above. Electronically Signed: By: Thornell Sartorius M.D. On: 07/12/2022 21:49   CT ABDOMEN PELVIS WO CONTRAST  Result Date: 07/12/2022 CLINICAL DATA:  Weakness, lethargy, reduced oral intake, hypotension EXAM: CT ABDOMEN AND PELVIS WITHOUT CONTRAST TECHNIQUE: Multidetector CT imaging of  the abdomen and pelvis was performed following the standard protocol without IV contrast. RADIATION DOSE REDUCTION: This exam was performed according to the departmental dose-optimization program which includes automated exposure control, adjustment of the mA and/or kV according to patient size and/or use of iterative reconstruction technique. COMPARISON:  Abdomen radiograph from 07/27/2018 FINDINGS: Lower chest: Mild to moderate cardiomegaly. Aortic and coronary artery atherosclerotic vascular disease. Substantial tortuosity of the lower descending thoracic aorta. Suprarenal aortic aneurysm at the hiatus is fusiform in shape and measures 5.8 by 5.7 cm on image 18 series 3. Aortic valve calcification noted. Mild dependent atelectasis in the lower lobes, right greater than left. Hepatobiliary: Multiple hypodense liver lesions are fluid density and likely cysts although some are technically too small to characterize. Dependent density in the gallbladder is probably from sludge although gallstones cannot be excluded. No biliary dilatation. Pancreas: Unremarkable Spleen: Unremarkable  Adrenals/Urinary Tract: Multiple fluid density lesions of both kidneys compatible with simple cysts. No definite adrenal lesion although the right adrenal gland is somewhat indistinct. Empty urinary bladder. Stomach/Bowel: Prominent rectal stool ball about 480 cc in volume, with perirectal stranding raising the possibility of stercoral colitis. Scattered air-fluid levels in nondilated small bowel, nonspecific. No small bowel dilatation to suggest small bowel obstruction. Vascular/Lymphatic: In addition to the 5.8 cm suprarenal aneurysm, an infrarenal aneurysm measures about 3.3 cm in diameter. There is some mild indistinctness of tissue planes between the abdominal aorta and the IVC although skeptical that this would be a subtle sign sentinel bleed particularly given that the aneurysm in this region is the smaller component. Right common iliac artery caliber 2.3 cm, left common iliac artery caliber 2.5 cm. Aortoiliac atherosclerotic calcification. There is some calcific plaque proximally in the SMA. Reproductive: Unremarkable Other: No supplemental non-categorized findings. Musculoskeletal: Lower thoracic and lumbar spondylosis and degenerative disc disease. This causes left greater than right foraminal impingement at the L5-S1 level. IMPRESSION: 1. Prominent rectal stool ball about 480 cc in volume, with perirectal stranding raising the possibility of stercoral colitis. 2. 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 3.3 cm infrarenal abdominal aortic aneurysm. There is some mild indistinctness of tissue planes between the abdominal aorta and the IVC although skeptical that this would be a subtle sign of sentinel bleed particularly given that the aneurysm in this region is the smaller component. Recommend referral to a vascular specialist. This recommendation follows ACR consensus guidelines: White Paper of the ACR Incidental Findings Committee II on Vascular Findings. J Am Coll Radiol 2013; 10:789-794. 3. Mild  to moderate cardiomegaly with aortic valve calcification. Coronary and systemic atherosclerosis. 4. Multiple hypodense liver lesions are fluid density and likely cysts although some are technically too small to characterize. 5. Dependent density in the gallbladder is probably from sludge although gallstones cannot be excluded. 6. Lower thoracic and lumbar spondylosis and degenerative disc disease causing left greater than right foraminal impingement at L5-S1. Aortic Atherosclerosis (ICD10-I70.0). Electronically Signed   By: Gaylyn Rong M.D.   On: 07/12/2022 18:51   CT HEAD WO CONTRAST ( )  Result Date: 07/12/2022 CLINICAL DATA:  Altered mental status. Weakness and lethargy with reduced oral intake. Hypotension. EXAM: CT HEAD WITHOUT CONTRAST TECHNIQUE: Contiguous axial images were obtained from the base of the skull through the vertex without intravenous contrast. RADIATION DOSE REDUCTION: This exam was performed according to the departmental dose-optimization program which includes automated exposure control, adjustment of the mA and/or kV according to patient size and/or use of iterative reconstruction technique. COMPARISON:  01/02/2018  FINDINGS: Brain: The brainstem, cerebellum, cerebral peduncles, thalami, basal ganglia, basilar cisterns, and ventricular system appear within normal limits. Periventricular white matter and corona radiata hypodensities favor chronic ischemic microvascular white matter disease. No intracranial hemorrhage, mass lesion, or acute CVA. Vascular: There is atherosclerotic calcification of the cavernous carotid arteries bilaterally. Skull: Unremarkable Sinuses/Orbits: Chronic right maxillary and mild chronic ethmoid sinusitis. The mucoperiosteal thickening in the right maxillary sinus has speckled internal calcification and fungal involvement is not excluded. This appearance is similar to prior. Other: No supplemental non-categorized findings. IMPRESSION: 1. No acute  intracranial findings. 2. Periventricular white matter and corona radiata hypodensities favor chronic ischemic microvascular white matter disease. 3. Atherosclerosis. 4. Chronic right maxillary and mild chronic ethmoid sinusitis. Electronically Signed   By: Van Clines M.D.   On: 07/12/2022 18:39   DG Chest Portable 1 View  Result Date: 07/12/2022 CLINICAL DATA:  Altered mental status EXAM: PORTABLE CHEST 1 VIEW COMPARISON:  07/09/2018 FINDINGS: Cardiomegaly. Aneurysmal appearance of the aortic arch, which appears enlarged in comparison to prior examination dated 07/09/2018. Both lungs are clear. The visualized skeletal structures are unremarkable. IMPRESSION: 1. Aneurysmal appearance of the aortic arch, which appears enlarged in comparison to prior examination dated 07/09/2018. Recommend CT to further evaluate if clinically appropriate. 2. Cardiomegaly. 3. No acute abnormality of the lungs. Electronically Signed   By: Delanna Ahmadi M.D.   On: 07/12/2022 15:53    Procedures Ultrasound ED Peripheral IV (Provider)  Date/Time: 07/12/2022 3:59 PM  Performed by: Regan Lemming, MD Authorized by: Regan Lemming, MD   Procedure details:    Indications: hypotension     Skin Prep: chlorhexidine gluconate     Location:  Right AC   Angiocath:  20 G   Bedside Ultrasound Guided: Yes     Images: not archived     Patient tolerated procedure without complications: Yes     Dressing applied: Yes   Comments:     Pt very difficult IV placement and had four total [place] IV attempts as IVs infiltrated following initial successful placement.     Medications Ordered in ED Medications  lactated ringers infusion ( Intravenous Not Given 07/12/22 2236)  lactated ringers bolus 1,000 mL (1,000 mLs Intravenous Not Given 07/12/22 2237)  cefTRIAXone (ROCEPHIN) 2 g in sodium chloride 0.9 % 100 mL IVPB (0 g Intravenous Stopped 07/12/22 2228)  enoxaparin (LOVENOX) injection 30 mg (has no administration in time range)   acetaminophen (TYLENOL) tablet 650 mg (has no administration in time range)    Or  acetaminophen (TYLENOL) suppository 650 mg (has no administration in time range)  ondansetron (ZOFRAN) tablet 4 mg (has no administration in time range)    Or  ondansetron (ZOFRAN) injection 4 mg (has no administration in time range)  senna-docusate (Senokot-S) tablet 1 tablet (has no administration in time range)  polyethylene glycol (MIRALAX / GLYCOLAX) packet 17 g (has no administration in time range)  bisacodyl (DULCOLAX) suppository 10 mg (has no administration in time range)  amLODipine (NORVASC) tablet 2.5 mg (has no administration in time range)  atorvastatin (LIPITOR) tablet 20 mg (has no administration in time range)  Memantine HCl-Donepezil HCl 28-10 MG CP24 1 capsule (has no administration in time range)  multivitamin with minerals tablet 1 tablet (has no administration in time range)  QUEtiapine (SEROQUEL) tablet 100 mg (100 mg Oral Not Given 07/12/22 2330)  hydrALAZINE (APRESOLINE) injection 5-10 mg (has no administration in time range)  metroNIDAZOLE (FLAGYL) IVPB 500 mg (has no administration in time  range)  sodium chloride 0.9 % bolus 500 mL (0 mLs Intravenous Stopped 07/12/22 1713)  sodium chloride 0.9 % bolus 500 mL (0 mLs Intravenous Stopped 07/12/22 2158)  magnesium sulfate IVPB 2 g 50 mL (0 g Intravenous Stopped 07/12/22 2315)  iohexol (OMNIPAQUE) 350 MG/ML injection 80 mL (80 mLs Intravenous Contrast Given 07/12/22 2110)    ED Course/ Medical Decision Making/ A&P Clinical Course as of 07/12/22 2344  Sat Jul 12, 2022  1601 Lactic Acid, Venous(!!): 6.5 [JL]  1730 WBC(!): 13.7 [JL]  2049 Glori Luis): MODERATE [JL]  2050 Bacteria, UA(!): MANY [JL]  2050 WBC, UA: >50 [JL]    Clinical Course User Index [JL] Ernie Avena, MD                             Medical Decision Making Amount and/or Complexity of Data Reviewed Labs: ordered. Decision-making details documented in ED  Course. Radiology: ordered.  Risk Prescription drug management. Decision regarding hospitalization.    87 year old female with medical history significant for Alzheimer's and vascular dementia, HTN, HLD, constipation, UTIs and sepsis, DM2 who presents to the emergency department with no bowel movement for the past week, fatigue, decline in mental status.  The history is provided by the patient's daughter who was present bedside as the patient was in provide history of present illness due to her mental status and dementia.  Family notes that the patient has had decreased oral intake all throughout the past week.  They did not check her temperature at home.  They feel like she has been increasingly fatigued, lethargic, slight change in mental status from her baseline with more somnolence noted.  They thought she had a fecal impaction and they tried an at home enema without success.  She has had no nausea or vomiting but has not been eating.  EMS was called out due to her change in mental status today and lethargic status that she was found to be hypotensive with initial blood pressure of 79 systolic.  Arrival, the patient was afebrile, temperature 97.5, not tachycardic heart rate 87, mildly tachypneic RR 24, hypotensive BP 75/55, improved to 85/62 without immediate intervention, subsequently improved following initial fluid resuscitation of 500 cc.  Patient was more somnolent compared to her baseline per her daughter who was present bedside.  No wheezing rales or rhonchi on physical exam abdomen was soft, nontender, nondistended.  A rectal exam was performed and did not reveal stool in the immediate rectal vault. No melena or hematochezia.  Differential diagnosis includes constipation, small bowel obstruction, fecal impaction, hypotension from hypovolemia/dehydration and poor p.o. intake, infectious etiology such as urinary tract infection, intra-abdominal infection.  Patient's blood pressure improved  with a 1 L IV fluid bolus.  Laboratory evaluation significant for CBC with a leukocytosis of 13.7, no anemia, CBG 113, hypomagnesemia to 1.4, COVID-19, influenza, RSV PCR testing negative, BMP with evidence of an AKI with a serum creatinine of 2.41 and an elevated BUN of 29, urinalysis with evidence of urinary tract infection with moderate leukocytes, greater than 50 WBCs and many bacteria, initial lactic acid was elevated to 6.5, repeat downtrending to 3.7 and subsequently resolved to 1.3 following just 1 L of IV fluids.  Low concern for sepsis at this time as patient not meeting SIRS criteria, afebrile, not tachycardic, not tachypneic with only source of infection being possibly genitourinary versus stercoral colitis given the patient's report of constipation.  Abdomen was soft, nontender, nondistended, no  rebound or guarding and she was in no respiratory distress with no complaint of any pain.  Her CT abdomen pelvis was performed without contrast given her AKI which revealed a new aneurysm that was unknown to family members.  A CT head was performed which was unremarkable.  Patient CT abdomen pelvis revealed the following: IMPRESSION:  1. Prominent rectal stool ball about 480 cc in volume, with  perirectal stranding raising the possibility of stercoral colitis.  2. 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 3.3 cm  infrarenal abdominal aortic aneurysm. There is some mild  indistinctness of tissue planes between the abdominal aorta and the  IVC although skeptical that this would be a subtle sign of sentinel  bleed particularly given that the aneurysm in this region is the  smaller component. Recommend referral to a vascular specialist. This  recommendation follows ACR consensus guidelines: White Paper of the  ACR Incidental Findings Committee II on Vascular Findings. J Am Coll  Radiol 2013; 10:789-794.  3. Mild to moderate cardiomegaly with aortic valve calcification.  Coronary and systemic  atherosclerosis.  4. Multiple hypodense liver lesions are fluid density and likely  cysts although some are technically too small to characterize.  5. Dependent density in the gallbladder is probably from sludge  although gallstones cannot be excluded.  6. Lower thoracic and lumbar spondylosis and degenerative disc  disease causing left greater than right foraminal impingement at  L5-S1.    Aortic Atherosclerosis (ICD10-I70.0).    Given this finding, I consulted vascular surgery and CTA chest abdomen pelvis was ordered to further evaluate.  Dr. Sherral Hammersobbins of vascular surgery evaluated the patient.  She is not a surgical candidate for any type of aneurysmal repair based on her age, medical comorbidities.    CTA dissection study revealed the following: IMPRESSION:  1. Aortic atherosclerosis with multifocal aneurysmal dilatation of  the thoracic and abdominal aorta. 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 A small penetrating ulcer is also noted in this region.  Aneurysmal dilatation of the ascending aorta measuring 4.2 cm and  descending aorta measuring 5.3 cm. Partially calcified dissection  flap is noted in the descending thoracic aorta. Cardiothoracic  surgery consultation recommended due to increased risk of rupture  for arch aneurysm ? 5.5 cm. This recommendation follows 2010  ACCF/AHA/AATS/ACR/ASA/SCA/SCAI/SIR/STS/SVM Guidelines for the  Diagnosis and Management of Patients With Thoracic Aortic Disease.  Circulation. 2010; 121: Z610-R604: E266-e369. Aortic aneurysm NOS (ICD10-I71.9)  2. Multifocal aneurysmal dilatation of the abdominal aorta measuring  up to 5.8 cm in the proximal abdominal aorta. Atherosclerotic  calcification of the common iliac arteries bilaterally with  aneurysmal dilatation of the common iliac arteries measuring up to  2.7 cm on the left.  Recommend referral to a vascular specialist.  This recommendation follows ACR consensus guidelines: White Paper of  the ACR Incidental Findings Committee II on Vascular Findings. J Am  Coll Radiol 2013; 10:789-794.  3. 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  4. 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  5. Large amount of stool in the rectum with rectal wall thickening  and perirectal fat stranding, suggesting stercoral colitis.  6. Cardiomegaly with coronary artery calcifications.  7. Remaining chronic findings as described above.   I spoke with on-call cardiothoracic surgery.  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  After discussion with vascular surgery as well, Dr. Unk Lightning this appears to be chronic.  Per Dr. Reinaldo Berber, no surgical intervention recommended, recommended medical management.  Given the findings of cervical colitis as well on CT, I reached out to general surgery who recommended no immediate intervention, gastroenterology consultation for bowel regimen as there is no evidence for perforation on CT imaging.  For the patient with IV Rocephin and Flagyl for antibiosis.  On repeat assessment, the patient was asymptomatic, resting comfortably, in no distress.   After discussion with family members bedside, the patient's CODE STATUS was changed to DNR.  I consulted gastroenterology given the patient's findings of stercoral colitis.  I spoke with Dr. Alcario Drought of hospitalist medicine who accepted the patient in admission.  {Document critical care time when appropriate:1} {Document review of labs and clinical decision tools ie heart score, Chads2Vasc2 etc:1}  {Document your independent review of radiology images, and any outside records:1} {Document your discussion with family members, caretakers, and with consultants:1} {Document social determinants  of health affecting pt's care:1} {Document your decision making why or why not admission, treatments were needed:1} Final Clinical Impression(s) / ED Diagnoses Final diagnoses:  Hypotension, unspecified hypotension type  Stercoral colitis  Acute cystitis without hematuria    Rx / DC Orders ED Discharge Orders     None

## 2022-07-12 NOTE — ED Notes (Signed)
Pt going to CT and only has one IV so unable to start atx until pt comes back

## 2022-07-12 NOTE — Consult Note (Addendum)
Hospital Consult    Reason for Consult: Aortic aneurysm Requesting Physician: ED MRN #:  [id number]  History of Present Illness: This is a 87 y.o. female with history of dementia and constipation, who presented to the emergency department with abdominal pain after several days of being unable to have a bowel movement.  She has a history of constipation,, and takes laxatives as well as enemas at home.  She is cared for by her 4 daughters.  On exam, Colleena was pleasant, she denied abdominal pain, no chest or back pain, and complained that my hands were cold.  A native of [place], she is a Writer of [employer].  4 daughters, husband passed away 1 year ago today.  Unable to have significant discussion due to her dementia, but per her daughters, she was unaware of her abdominal aortic aneurysm.   Past Medical History:  Diagnosis Date   Anxiety state, unspecified    Cataract    Depressive disorder, not elsewhere classified    Lumbago    Memory loss    Other and unspecified hyperlipidemia    Rickets, late effect    Tobacco use disorder    Unspecified constipation    Unspecified essential hypertension    Varicose veins of lower extremities     Past Surgical History:  Procedure Laterality Date   CYST EXCISION Bilateral    behind ears   MOUTH SURGERY  04/12/2014    Allergies  Allergen Reactions   Penicillins Swelling and Rash    Tolerate ceftriaxone    Prior to Admission medications   Medication Sig Start Date End Date Taking? Authorizing Provider  acetaminophen (TYLENOL) 325 MG tablet Take 1 tablet (325 mg total) by mouth daily as needed (pain). 07/26/20   Reed, Tiffany L, DO  alendronate (FOSAMAX) 70 MG tablet TAKE 1 TABLET(70 MG) BY MOUTH 1 TIME A WEEK WITH A FULL GLASS OF WATER AND ON AN EMPTY STOMACH 12/26/21   Ngetich, Dinah C, NP  amLODipine (NORVASC) 2.5 MG tablet Take 1 tablet (2.5 mg total) by mouth daily. 09/04/21   Ngetich, Dinah C, NP  atorvastatin  (LIPITOR) 20 MG tablet TAKE 1 TABLET(20 MG) BY MOUTH DAILY 09/02/21   Ngetich, Dinah C, NP  Calcium Carb-Cholecalciferol (CALCIUM 600 + D PO) Take 1 tablet by mouth daily.    [provider]  Cholecalciferol (VITAMIN D3 PO) Take 1,000 Units by mouth. Daily-otc    [provider]  lisinopril (ZESTRIL) 5 MG tablet TAKE 1 TABLET(5 MG) BY MOUTH DAILY FOR KIDNEY PROTECTION 02/20/22   Ngetich, Dinah C, NP  Memantine HCl-Donepezil HCl (NAMZARIC) 28-10 MG CP24 TAKE 1 CAPSULE BY MOUTH DAILY 09/19/21   Ngetich, Dinah C, NP  Multiple Vitamins-Minerals (MULTIVITAMIN WITH MINERALS) tablet Take 1 tablet by mouth daily with supper. Centrum Silver    [provider]  nystatin (MYCOSTATIN/NYSTOP) powder Apply topically 2 (two) times daily. To groin and abdomen area 07/26/20   Reed, Tiffany L, DO  nystatin cream (MYCOSTATIN) Apply 1 Application topically 2 (two) times daily. 02/26/22   Ngetich, Dinah C, NP  polyethylene glycol (MIRALAX / GLYCOLAX) packet Take 17 g by mouth daily. 07/28/18   Nche, Charlene Brooke, NP  Probiotic Product (PROBIOTIC DAILY PO) Take by mouth. Culturelle--otc    [provider]  QUEtiapine (SEROQUEL) 100 MG tablet TAKE 1 TABLET(100 MG) BY MOUTH TWICE DAILY 04/07/22   Ngetich, Dinah C, NP  senna-docusate (SENOKOT-S) 8.6-50 MG tablet Take 1 tablet by mouth at bedtime. 10/16/21  Ngetich, Nelda Bucks, NP    Social History   Socioeconomic History   Marital status: Married    Spouse name: Not on file   Number of children: Not on file   Years of education: Not on file   Highest education level: Not on file  Occupational History   Not on file  Tobacco Use   Smoking status: Former    Years: 55.00    Types: Cigarettes    Quit date: 05/09/2017    Years since quitting: 5.1   Smokeless tobacco: Never  Vaping Use   Vaping Use: Never used  Substance and Sexual Activity   Alcohol use: No    Alcohol/week: 0.0 standard drinks of alcohol   Drug use: No   Sexual  activity: Not on file  Other Topics Concern   Not on file  Social History Narrative   Not on file   Social Determinants of Health   Financial Resource Strain: Low Risk  (11/11/2017)   Overall Financial Resource Strain (CARDIA)    Difficulty of Paying Living Expenses: Not hard at all  Food Insecurity: No Food Insecurity (11/11/2017)   Hunger Vital Sign    Worried About Running Out of Food in the Last Year: Never true    [place] in the Last Year: Never true  Transportation Needs: No Transportation Needs (11/11/2017)   PRAPARE - Hydrologist (Medical): No    Lack of Transportation (Non-Medical): No  Physical Activity: Inactive (11/11/2017)   Exercise Vital Sign    Days of Exercise per Week: 0 days    Minutes of Exercise per Session: 0 min  Stress: Not on file  Social Connections: Moderately Integrated (11/11/2017)   Social Connection and Isolation Panel [NHANES]    Frequency of Communication with Friends and Family: More than three times a week    Frequency of Social Gatherings with Friends and Family: More than three times a week    Attends Religious Services: More than 4 times per year    Active Member of [employer] or Organizations: No    Attends Archivist Meetings: Never    Marital Status: Married  Human resources officer Violence: Not At Risk (11/11/2017)   Humiliation, Afraid, Rape, and Kick questionnaire    Fear of Current or Ex-Partner: No    Emotionally Abused: No    Physically Abused: No    Sexually Abused: No    Family History  Problem Relation Age of Onset   Heart disease Father     ROS: Otherwise negative unless mentioned in HPI  Physical Examination  Vitals:   07/12/22 1840 07/12/22 2025  BP: (!) 142/98 (!) 146/79  Pulse: 86 79  Resp: (!) 21 14  Temp:    SpO2: 98% 98%   Body mass index is 29.21 kg/m.  General:  WDWN in NAD Gait: Not observed HENT: WNL, normocephalic Pulmonary: normal non-labored breathing, without Rales,  rhonchi,  wheezing Cardiac: regular Abdomen: soft, NT/ND, no masses Skin: without rashes Vascular Exam/Pulses: Warm and well-perfused Extremities: without ischemic changes, without Gangrene , without cellulitis; without open wounds;  Musculoskeletal: no muscle wasting or atrophy  Neurologic: A&O X 3;  No focal weakness or paresthesias are detected; speech is fluent/normal Psychiatric:  The pt has Normal affect. Lymph:  Unremarkable  CBC    Component Value Date/Time   WBC 13.7 (H) 07/12/2022 1444   RBC 4.40 07/12/2022 1444   HGB 12.6 07/12/2022 1444   HGB 14.0 06/01/2015  1225   HCT 40.1 07/12/2022 1444   HCT 41.7 06/01/2015 1225   PLT 115 (L) 07/12/2022 1444   PLT 317 06/01/2015 1225   MCV 91.1 07/12/2022 1444   MCV 87 06/01/2015 1225   MCH 28.6 07/12/2022 1444   MCHC 31.4 07/12/2022 1444   RDW 14.7 07/12/2022 1444   RDW 13.7 06/01/2015 1225   LYMPHSABS 3,046 04/09/2022 0944   LYMPHSABS 2.4 06/01/2015 1225   MONOABS 0.7 07/09/2018 1817   EOSABS 252 04/09/2022 0944   EOSABS 0.1 06/01/2015 1225   BASOSABS 43 04/09/2022 0944   BASOSABS 0.0 06/01/2015 1225    BMET    Component Value Date/Time   NA 139 07/12/2022 1444   NA 141 10/05/2015 1619   K 4.1 07/12/2022 1444   CL 102 07/12/2022 1444   CO2 20 (L) 07/12/2022 1444   GLUCOSE 125 (H) 07/12/2022 1444   BUN 29 (H) 07/12/2022 1444   BUN 14 10/05/2015 1619   CREATININE 2.41 (H) 07/12/2022 1444   CREATININE 1.21 (H) 04/09/2022 0944   CALCIUM 8.8 (L) 07/12/2022 1444   GFRNONAA 19 (L) 07/12/2022 1444   GFRNONAA 50 (L) 12/06/2020 1049   GFRAA 58 (L) 12/06/2020 1049    COAGS: Lab Results  Component Value Date   INR 1.10 09/19/2009     ASSESSMENT/PLAN: This is a 87 y.o. female who presented the hospital with progressive abdominal pain, with known constipation.  She has not had a bowel movement in several days.  Per her daughters, she has had difficulty with p.o. intake and hydration.  Patient with significant  baseline dementia.  CT scan demonstrated colitis.  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  I had a long discussion with Lucella and her children regarding the aneurysm.  On physical exam, she did not have palpable pain in the abdomen, back or chest.  The aneurysm is likely incidental, but will follow-up with CT angio to ensure there are no issues.  With her comorbidities, I do not think she is a surgical candidate.  Type II thoracoabdominal repair would need to take place at [facility].    I do not think the daughters are interested in pursuing elective repair, and told them that she would likely not be deemed a candidate by [facility].  Should they change her mind, I am happy to give them a referral to their office.  _______________________________   No concern for rupture of type II thoracoabdominal aneurysm on CT angio chest abdomen pelvis. Etiology possibly from prior TBAD dissection - this is chronic. Aortic walls calcified.  No concerning features. No need for aggressive BP control which would utilized for an acute dissection. Pt remains asymptomatic. Patient would benefit from disimpaction, admission for colitis, palliative consult.  After discussing with family, no plans for emergent or elective intervention.  Will continue to follow peripherally.     Cassandria Santee MD [facility] [phone] 07/12/2022  8:44 PM

## 2022-07-12 NOTE — ED Notes (Signed)
Pt was incontinent of Bm, cleaned pt and applied a clean brief.

## 2022-07-12 NOTE — ED Notes (Signed)
Pt has a couple of stage 1 pressure ulcers on right and left buttocks areas.

## 2022-07-12 NOTE — Assessment & Plan Note (Addendum)
Senna-s Scheduled Dulcolax PRN EDP going to perform disimpaction.

## 2022-07-12 NOTE — Sepsis Progress Note (Signed)
Elink following for sepsis protocol. 

## 2022-07-12 NOTE — Assessment & Plan Note (Signed)
New diagnosis today; however, appears old and calcified on CT images per rads and vascular surgery.  Dr. Unk Lightning doesn't think this is an acute issue.

## 2022-07-12 NOTE — ED Provider Notes (Signed)
[facility] Provider Note   CSN: [id number] Arrival date & time: 07/12/22  1424     History  Chief Complaint  Patient presents with   Weakness    Carol [name] is a 87 y.o. female.   Weakness    87 year old female with medical history significant for Alzheimer's and vascular dementia, HTN, HLD, constipation, UTIs and sepsis, DM2 who presents to the emergency department with no bowel movement for the past week, fatigue, decline in mental status.  The history is provided by the patient's daughter who was present bedside as the patient was in provide history of present illness due to her mental status and dementia.  Family notes that the patient has had decreased oral intake all throughout the past week.  They did not check her temperature at home.  They feel like she has been increasingly fatigued, lethargic, slight change in mental status from her baseline with more somnolence noted.  They thought she had a fecal impaction and they tried an at home enema without success.  She has had no nausea or vomiting but has not been eating.  EMS was called out due to her change in mental status today and lethargic status that she was found to be hypotensive with initial blood pressure of 79 systolic.  Home Medications Prior to Admission medications   Medication Sig Start Date End Date Taking? Authorizing Provider  acetaminophen (TYLENOL) 325 MG tablet Take 1 tablet (325 mg total) by mouth daily as needed (pain). 07/26/20  Yes Reed, Tiffany L, DO  alendronate (FOSAMAX) 70 MG tablet TAKE 1 TABLET(70 MG) BY MOUTH 1 TIME A WEEK WITH A FULL GLASS OF WATER AND ON AN EMPTY STOMACH Patient taking differently: 70 mg once a week. TAKE 1 TABLET(70 MG) BY MOUTH 1 TIME A WEEK WITH A FULL GLASS OF WATER AND ON AN EMPTY STOMACH Thursday 12/26/21  Yes Ngetich, Dinah C, NP  amLODipine (NORVASC) 2.5 MG tablet Take 1 tablet (2.5 mg total) by mouth daily. Patient  taking differently: Take 2.5 mg by mouth in the morning and at bedtime. 09/04/21  Yes Ngetich, Dinah C, NP  atorvastatin (LIPITOR) 20 MG tablet TAKE 1 TABLET(20 MG) BY MOUTH DAILY Patient taking differently: Take 20 mg by mouth daily. 09/02/21  Yes Ngetich, Dinah C, NP  lisinopril (ZESTRIL) 5 MG tablet TAKE 1 TABLET(5 MG) BY MOUTH DAILY FOR KIDNEY PROTECTION Patient taking differently: Take 5 mg by mouth daily. 02/20/22  Yes Ngetich, Dinah C, NP  Memantine HCl-Donepezil HCl (NAMZARIC) 28-10 MG CP24 TAKE 1 CAPSULE BY MOUTH DAILY Patient taking differently: Take 1 capsule by mouth daily. 09/19/21  Yes Ngetich, Dinah C, NP  Multiple Vitamins-Minerals (MULTIVITAMIN WITH MINERALS) tablet Take 1 tablet by mouth daily with supper. Centrum Silver   Yes [provider]  nystatin (MYCOSTATIN/NYSTOP) powder Apply topically 2 (two) times daily. To groin and abdomen area Patient taking differently: Apply 1 Application topically 2 (two) times daily as needed (for redness or rash to groin and abdominal area). 07/26/20  Yes Reed, Tiffany L, DO  nystatin cream (MYCOSTATIN) Apply 1 Application topically 2 (two) times daily. Patient taking differently: Apply 1 Application topically 2 (two) times daily as needed (for redness or rash to groin and abdominal area). 02/26/22  Yes Ngetich, Dinah C, NP  polyethylene glycol (MIRALAX / GLYCOLAX) packet Take 17 g by mouth daily. Patient taking differently: Take 17 g by mouth daily as needed for mild constipation. 07/28/18  Yes  Nche, Bonna Gainsharlotte Lum, NP  Probiotic Product (PROBIOTIC DAILY PO) Take by mouth. Culturelle--otc   Yes [provider]  QUEtiapine (SEROQUEL) 100 MG tablet TAKE 1 TABLET(100 MG) BY MOUTH TWICE DAILY Patient taking differently: Take 100 mg by mouth 2 (two) times daily. 04/07/22  Yes Ngetich, Dinah C, NP  senna-docusate (SENOKOT-S) 8.6-50 MG tablet Take 1 tablet by mouth at bedtime. Patient taking differently: Take 1 tablet by mouth at bedtime as  needed for mild constipation. 10/16/21  Yes Ngetich, Dinah C, NP      Allergies    Penicillins    Review of Systems   Review of Systems  Unable to perform ROS: Dementia  Neurological:  Positive for weakness.    Physical Exam Updated Vital Signs BP 136/83 (BP Location: Right Arm)   Pulse 89   Temp 97.7 F (36.5 C) (Oral)   Resp 15   Ht 5\' 4"  (1.626 m)   Wt 77.2 kg   SpO2 100%   BMI 29.21 kg/m  Physical Exam Vitals and nursing note reviewed. Exam conducted with a chaperone present.  Constitutional:      General: She is not in acute distress.    Appearance: She is well-developed.     Comments: GCS 13, somnolent, arousable, AAOx1  HENT:     Head: Normocephalic and atraumatic.     Mouth/Throat:     Mouth: Mucous membranes are dry.  Eyes:     Conjunctiva/sclera: Conjunctivae normal.  Cardiovascular:     Rate and Rhythm: Normal rate and regular rhythm.  Pulmonary:     Effort: Pulmonary effort is normal. No respiratory distress.     Breath sounds: Normal breath sounds.  Abdominal:     Palpations: Abdomen is soft.     Tenderness: There is no abdominal tenderness.  Genitourinary:    Comments: Rectal exam without stool in the rectal vault. No melena or hematochezia Musculoskeletal:        General: No swelling.     Cervical back: Neck supple.  Skin:    General: Skin is warm and dry.     Capillary Refill: Capillary refill takes less than 2 seconds.  Neurological:     Mental Status: She is alert.  Psychiatric:        Mood and Affect: Mood normal.     ED Results / Procedures / Treatments   Labs (all labs ordered are listed, but only abnormal results are displayed) Labs Reviewed  BASIC METABOLIC PANEL - Abnormal; Notable for the following components:      Result Value   CO2 20 (*)    Glucose, Bld 125 (*)    BUN 29 (*)    Creatinine, Ser 2.41 (*)    Calcium 8.8 (*)    GFR, Estimated 19 (*)    Anion gap 17 (*)    All other components within normal limits  CBC -  Abnormal; Notable for the following components:   WBC 13.7 (*)    Platelets 115 (*)    All other components within normal limits  URINALYSIS, ROUTINE W REFLEX MICROSCOPIC - Abnormal; Notable for the following components:   Color, Urine AMBER (*)    APPearance CLOUDY (*)    Ketones, ur 5 (*)    Protein, ur 100 (*)    Leukocytes,Ua MODERATE (*)    Bacteria, UA MANY (*)    All other components within normal limits  LACTIC ACID, PLASMA - Abnormal; Notable for the following components:   Lactic Acid, Venous 6.5 (*)  All other components within normal limits  LACTIC ACID, PLASMA - Abnormal; Notable for the following components:   Lactic Acid, Venous 3.7 (*)    All other components within normal limits  MAGNESIUM - Abnormal; Notable for the following components:   Magnesium 1.4 (*)    All other components within normal limits  CBG MONITORING, ED - Abnormal; Notable for the following components:   Glucose-Capillary 113 (*)    All other components within normal limits  TROPONIN I (HIGH SENSITIVITY) - Abnormal; Notable for the following components:   Troponin I (High Sensitivity) 24 (*)    All other components within normal limits  TROPONIN I (HIGH SENSITIVITY) - Abnormal; Notable for the following components:   Troponin I (High Sensitivity) 21 (*)    All other components within normal limits  RESP PANEL BY RT-PCR (RSV, FLU A&B, COVID)  RVPGX2  CULTURE, BLOOD (ROUTINE X 2)  CULTURE, BLOOD (ROUTINE X 2)  URINE CULTURE  LACTIC ACID, PLASMA  LACTIC ACID, PLASMA  CBC  COMPREHENSIVE METABOLIC PANEL    EKG EKG Interpretation  Date/Time:  Saturday July 12 2022 14:36:09 EST Ventricular Rate:  91 PR Interval:  172 QRS Duration: 90 QT Interval:  353 QTC Calculation: 435 R Axis:   6 Text Interpretation: Sinus rhythm Probable left atrial enlargement Inferior infarct, old Abnormal lateral Q waves Confirmed by Ernie AvenaLawsing, Emmalise Huard ([id number]) on 07/12/2022 4:06:49 PM  Radiology CT Angio  Chest/Abd/Pel for Dissection W and/or Wo Contrast  Addendum Date: 07/12/2022   ADDENDUM REPORT: 07/12/2022 21:55 ADDENDUM: Critical Value/emergent results were called by telephone at the time of interpretation on 07/12/2022 at 9:53 pm to provider Ernie AvenaJAMES Kathlene Yano , who verbally acknowledged these results. Electronically Signed   By: Thornell SartoriusLaura  Taylor M.D.   On: 07/12/2022 21:55   Result Date: 07/12/2022 CLINICAL DATA:  Acute aortic syndrome suspected. Thoracoabdominal aortic aneurysm follow-up. EXAM: CT ANGIOGRAPHY CHEST, ABDOMEN AND PELVIS TECHNIQUE: Non-contrast CT of the chest was initially obtained. Multidetector CT imaging through the chest, abdomen and pelvis was performed using the standard protocol during bolus administration of intravenous contrast. Multiplanar reconstructed images and MIPs were obtained and reviewed to evaluate the vascular anatomy. RADIATION DOSE REDUCTION: This exam was performed according to the departmental dose-optimization program which includes automated exposure control, adjustment of the mA and/or kV according to patient size and/or use of iterative reconstruction technique. CONTRAST:  80mL OMNIPAQUE IOHEXOL 350 MG/ML SOLN COMPARISON:  07/12/2022. FINDINGS: CTA CHEST FINDINGS Cardiovascular: Heart is enlarged and there is no pericardial effusion. Three-vessel coronary artery calcifications are seen. There is atherosclerotic calcification of the aorta with a calcified dissection flap originating in the proximal aortic arch at the origin of the left subclavian artery. Calcified dissection flap is also noted in the mid to distal abdominal aorta. There is aneurysmal dilatation of the ascending aorta measuring 4.2 cm. There is aneurysmal dilatation of the aortic arch measuring up to 7.2 cm there is aneurysmal dilatation of the descending thoracic aorta measuring 5.3 cm. A penetrating ulceration is noted at the aortic arch. There is a focal aneurysm of the ascending aorta measuring 2.7 cm.  Mediastinum/Nodes: No mediastinal, hilar, or axillary lymphadenopathy. The thyroid gland, trachea, and esophagus are within normal limits. Lungs/Pleura: Paraseptal and centrilobular emphysematous changes are present in the lungs. Atelectasis is present bilaterally. No effusion or pneumothorax. Musculoskeletal: Degenerative changes are present in the thoracic spine. No acute or suspicious osseous abnormality. Review of the MIP images confirms the above findings. CTA ABDOMEN AND PELVIS FINDINGS VASCULAR  Aorta: Aortic atherosclerosis with fusiform aneurysmal dilatation of the proximal abdominal aorta measuring measuring 5.8 x 5.5 cm. There is aneurysmal dilatation of the distal abdominal aorta measuring 3.4 x 3.2 cm. Celiac: Patent without evidence of aneurysm, dissection, vasculitis or significant stenosis. SMA: Patent without evidence of aneurysm, dissection, vasculitis or significant stenosis. Renals: Both renal arteries are patent without evidence of aneurysm, dissection, vasculitis, fibromuscular dysplasia or significant stenosis. IMA: Patent. Inflow: Atherosclerotic calcification with mural thrombus and aneurysmal dilatation of the common iliac arteries measuring 2.2 cm on the right and 2.7 cm on the left. There is aneurysmal dilatation of the proximal internal iliac artery on the left with thrombus and occlusion of the external iliac artery on the left. There is moderate-to-severe stenosis of the proximal external iliac arteries bilaterally. Veins: No obvious venous abnormality within the limitations of this arterial phase study. Review of the MIP images confirms the above findings. NON-VASCULAR Hepatobiliary: Hypodensities are present in the liver, most likely representing cysts or hemangiomas. No biliary ductal dilatation. A small stone is present in the gallbladder. Pancreas: Unremarkable. No pancreatic ductal dilatation or surrounding inflammatory changes. Spleen: Normal in size without focal abnormality.  Adrenals/Urinary Tract: The adrenal glands are within normal limits. Renal cysts are present bilaterally. The kidneys enhance symmetrically. No renal calculus or hydronephrosis. The bladder is unremarkable. Stomach/Bowel: Stomach is within normal limits. Appendix appears normal. No evidence of bowel wall thickening, distention, or inflammatory changes. No free air or pneumatosis. Large amount of retained stool is present in the rectum with rectal wall thickening and perirectal fat stranding. Lymphatic: No abdominal or pelvic lymphadenopathy. Reproductive: Uterus and bilateral adnexa are unremarkable. Other: No abdominopelvic ascites. Musculoskeletal: Degenerative changes are present in the lumbar spine and right hip. No acute osseous abnormality. Review of the MIP images confirms the above findings. IMPRESSION: 1. Aortic atherosclerosis with multifocal aneurysmal dilatation of the thoracic and abdominal aorta. There is aneurysmal dilatation of the aortic arch measuring up to 7.2 cm with a partially calcified dissection flap originating at the origin of the left subclavian artery. A small penetrating ulcer is also noted in this region. Aneurysmal dilatation of the ascending aorta measuring 4.2 cm and descending aorta measuring 5.3 cm. Partially calcified dissection flap is noted in the descending thoracic aorta. Cardiothoracic surgery consultation recommended due to increased risk of rupture for arch aneurysm ? 5.5 cm. This recommendation follows 2010 ACCF/AHA/AATS/ACR/ASA/SCA/SCAI/SIR/STS/SVM Guidelines for the Diagnosis and Management of Patients With Thoracic Aortic Disease. Circulation. 2010; 121: O756-E332. Aortic aneurysm NOS (ICD10-I71.9) 2. Multifocal aneurysmal dilatation of the abdominal aorta measuring up to 5.8 cm in the proximal abdominal aorta. Atherosclerotic calcification of the common iliac arteries bilaterally with aneurysmal dilatation of the common iliac arteries measuring up to 2.7 cm on the  left. Recommend referral to a vascular specialist. This recommendation follows ACR consensus guidelines: White Paper of the ACR Incidental Findings Committee II on Vascular Findings. J Am Coll Radiol 2013; 10:789-794. 3. 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 4. Moderate-to-severe stenosis involving the origins of the external iliac arteries bilaterally. 5. Large amount of stool in the rectum with rectal wall thickening and perirectal fat stranding, suggesting stercoral colitis. 6. Cardiomegaly with coronary artery calcifications. 7. Remaining chronic findings as described above. Electronically Signed: By: Thornell Sartorius M.D. On: 07/12/2022 21:49   CT ABDOMEN PELVIS WO CONTRAST  Result Date: 07/12/2022 CLINICAL DATA:  Weakness, lethargy, reduced oral intake, hypotension EXAM: CT ABDOMEN AND PELVIS WITHOUT CONTRAST TECHNIQUE: Multidetector CT imaging of  the abdomen and pelvis was performed following the standard protocol without IV contrast. RADIATION DOSE REDUCTION: This exam was performed according to the departmental dose-optimization program which includes automated exposure control, adjustment of the mA and/or kV according to patient size and/or use of iterative reconstruction technique. COMPARISON:  Abdomen radiograph from 07/27/2018 FINDINGS: Lower chest: Mild to moderate cardiomegaly. Aortic and coronary artery atherosclerotic vascular disease. Substantial tortuosity of the lower descending thoracic aorta. Suprarenal aortic aneurysm at the hiatus is fusiform in shape and measures 5.8 by 5.7 cm on image 18 series 3. Aortic valve calcification noted. Mild dependent atelectasis in the lower lobes, right greater than left. Hepatobiliary: Multiple hypodense liver lesions are fluid density and likely cysts although some are technically too small to characterize. Dependent density in the gallbladder is probably from sludge although gallstones cannot be excluded. No biliary dilatation. Pancreas:  Unremarkable Spleen: Unremarkable Adrenals/Urinary Tract: Multiple fluid density lesions of both kidneys compatible with simple cysts. No definite adrenal lesion although the right adrenal gland is somewhat indistinct. Empty urinary bladder. Stomach/Bowel: Prominent rectal stool ball about 480 cc in volume, with perirectal stranding raising the possibility of stercoral colitis. Scattered air-fluid levels in nondilated small bowel, nonspecific. No small bowel dilatation to suggest small bowel obstruction. Vascular/Lymphatic: In addition to the 5.8 cm suprarenal aneurysm, an infrarenal aneurysm measures about 3.3 cm in diameter. There is some mild indistinctness of tissue planes between the abdominal aorta and the IVC although skeptical that this would be a subtle sign sentinel bleed particularly given that the aneurysm in this region is the smaller component. Right common iliac artery caliber 2.3 cm, left common iliac artery caliber 2.5 cm. Aortoiliac atherosclerotic calcification. There is some calcific plaque proximally in the SMA. Reproductive: Unremarkable Other: No supplemental non-categorized findings. Musculoskeletal: Lower thoracic and lumbar spondylosis and degenerative disc disease. This causes left greater than right foraminal impingement at the L5-S1 level. IMPRESSION: 1. Prominent rectal stool ball about 480 cc in volume, with perirectal stranding raising the possibility of stercoral colitis. 2. 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 3.3 cm infrarenal abdominal aortic aneurysm. There is some mild indistinctness of tissue planes between the abdominal aorta and the IVC although skeptical that this would be a subtle sign of sentinel bleed particularly given that the aneurysm in this region is the smaller component. Recommend referral to a vascular specialist. This recommendation follows ACR consensus guidelines: White Paper of the ACR Incidental Findings Committee II on Vascular Findings. J Am  Coll Radiol 2013; 10:789-794. 3. Mild to moderate cardiomegaly with aortic valve calcification. Coronary and systemic atherosclerosis. 4. Multiple hypodense liver lesions are fluid density and likely cysts although some are technically too small to characterize. 5. Dependent density in the gallbladder is probably from sludge although gallstones cannot be excluded. 6. Lower thoracic and lumbar spondylosis and degenerative disc disease causing left greater than right foraminal impingement at L5-S1. Aortic Atherosclerosis (ICD10-I70.0). Electronically Signed   By: Walter  Liebkemann M.D.   On: 07/12/2022 18:51   CT HEAD WO CONTRAST [facility]esult Date: 07/12/2022 CLINICAL DATA:  Altered mental status. Weakness and lethargy with reduced oral intake. Hypotension. EXAM: CT HEAD WITHOUT CONTRAST TECHNIQUE: Contiguous axial images were obtained from the base of the skull through the vertex without intravenous contrast. RADIATION DOSE REDUCTION: This exam was performed according to the departmental dose-optimization program which includes automated exposure control, adjustment of the mA and/or kV according to patient size and/or use of iterative reconstruction technique. COMPARISON:  01/02/2018  FINDINGS: Brain: The brainstem, cerebellum, cerebral peduncles, thalami, basal ganglia, basilar cisterns, and ventricular system appear within normal limits. Periventricular white matter and corona radiata hypodensities favor chronic ischemic microvascular white matter disease. No intracranial hemorrhage, mass lesion, or acute CVA. Vascular: There is atherosclerotic calcification of the cavernous carotid arteries bilaterally. Skull: Unremarkable Sinuses/Orbits: Chronic right maxillary and mild chronic ethmoid sinusitis. The mucoperiosteal thickening in the right maxillary sinus has speckled internal calcification and fungal involvement is not excluded. This appearance is similar to prior. Other: No supplemental non-categorized  findings. IMPRESSION: 1. No acute intracranial findings. 2. Periventricular white matter and corona radiata hypodensities favor chronic ischemic microvascular white matter disease. 3. Atherosclerosis. 4. Chronic right maxillary and mild chronic ethmoid sinusitis. Electronically Signed   By: Van Clines M.D.   On: 07/12/2022 18:39   DG Chest Portable 1 View  Result Date: 07/12/2022 CLINICAL DATA:  Altered mental status EXAM: PORTABLE CHEST 1 VIEW COMPARISON:  07/09/2018 FINDINGS: Cardiomegaly. Aneurysmal appearance of the aortic arch, which appears enlarged in comparison to prior examination dated 07/09/2018. Both lungs are clear. The visualized skeletal structures are unremarkable. IMPRESSION: 1. Aneurysmal appearance of the aortic arch, which appears enlarged in comparison to prior examination dated 07/09/2018. Recommend CT to further evaluate if clinically appropriate. 2. Cardiomegaly. 3. No acute abnormality of the lungs. Electronically Signed   By: Delanna Ahmadi M.D.   On: 07/12/2022 15:53    Procedures Ultrasound ED Peripheral IV (Provider)  Date/Time: 07/12/2022 3:59 PM  Performed by: Regan Lemming, MD Authorized by: Regan Lemming, MD   Procedure details:    Indications: hypotension     Skin Prep: chlorhexidine gluconate     Location:  Right AC   Angiocath:  20 G   Bedside Ultrasound Guided: Yes     Images: not archived     Patient tolerated procedure without complications: Yes     Dressing applied: Yes   Comments:     Pt very difficult IV placement and had four total [place] IV attempts as IVs infiltrated following initial successful placement.     Medications Ordered in ED Medications  lactated ringers infusion ( Intravenous Not Given 07/12/22 2236)  lactated ringers bolus 1,000 mL (1,000 mLs Intravenous Not Given 07/12/22 2237)  cefTRIAXone (ROCEPHIN) 2 g in sodium chloride 0.9 % 100 mL IVPB (0 g Intravenous Stopped 07/12/22 2228)  enoxaparin (LOVENOX) injection 30 mg (has no  administration in time range)  acetaminophen (TYLENOL) tablet 650 mg (has no administration in time range)    Or  acetaminophen (TYLENOL) suppository 650 mg (has no administration in time range)  ondansetron (ZOFRAN) tablet 4 mg (has no administration in time range)    Or  ondansetron (ZOFRAN) injection 4 mg (has no administration in time range)  senna-docusate (Senokot-S) tablet 1 tablet (1 tablet Oral Given 07/13/22 0000)  polyethylene glycol (MIRALAX / GLYCOLAX) packet 17 g (has no administration in time range)  bisacodyl (DULCOLAX) suppository 10 mg (has no administration in time range)  amLODipine (NORVASC) tablet 2.5 mg (2.5 mg Oral Given 07/13/22 0001)  atorvastatin (LIPITOR) tablet 20 mg (has no administration in time range)  Memantine HCl-Donepezil HCl 28-10 MG CP24 1 capsule (has no administration in time range)  multivitamin with minerals tablet 1 tablet (has no administration in time range)  QUEtiapine (SEROQUEL) tablet 100 mg (100 mg Oral Not Given 07/12/22 2330)  hydrALAZINE (APRESOLINE) injection 5-10 mg (has no administration in time range)  sodium chloride 0.9 % bolus 500 mL (0 mLs Intravenous  Stopped 07/12/22 1713)  sodium chloride 0.9 % bolus 500 mL (0 mLs Intravenous Stopped 07/12/22 2158)  magnesium sulfate IVPB 2 g 50 mL (0 g Intravenous Stopped 07/12/22 2315)  iohexol (OMNIPAQUE) 350 MG/ML injection 80 mL (80 mLs Intravenous Contrast Given 07/12/22 2110)  metroNIDAZOLE (FLAGYL) IVPB 500 mg (500 mg Intravenous New Bag/Given 07/13/22 0003)    ED Course/ Medical Decision Making/ A&P Clinical Course as of 07/13/22 0117  Sat Jul 12, 2022  1601 Lactic Acid, Venous(!!): 6.5 [JL]  1730 WBC(!): 13.7 [JL]  2049 Glori Luis): MODERATE [JL]  2050 Bacteria, UA(!): MANY [JL]  2050 WBC, UA: >50 [JL]    Clinical Course User Index [JL] Ernie Avena, MD                             Medical Decision Making Amount and/or Complexity of Data Reviewed Labs: ordered. Decision-making  details documented in ED Course. Radiology: ordered.  Risk Prescription drug management. Decision regarding hospitalization.    87 year old female with medical history significant for Alzheimer's and vascular dementia, HTN, HLD, constipation, UTIs and sepsis, DM2 who presents to the emergency department with no bowel movement for the past week, fatigue, decline in mental status.  The history is provided by the patient's daughter who was present bedside as the patient was in provide history of present illness due to her mental status and dementia.  Family notes that the patient has had decreased oral intake all throughout the past week.  They did not check her temperature at home.  They feel like she has been increasingly fatigued, lethargic, slight change in mental status from her baseline with more somnolence noted.  They thought she had a fecal impaction and they tried an at home enema without success.  She has had no nausea or vomiting but has not been eating.  EMS was called out due to her change in mental status today and lethargic status that she was found to be hypotensive with initial blood pressure of 79 systolic.  Arrival, the patient was afebrile, temperature 97.5, not tachycardic heart rate 87, mildly tachypneic RR 24, hypotensive BP 75/55, improved to 85/62 without immediate intervention, subsequently improved following initial fluid resuscitation of 500 cc.  Patient was more somnolent compared to her baseline per her daughter who was present bedside.  No wheezing rales or rhonchi on physical exam abdomen was soft, nontender, nondistended.  A rectal exam was performed and did not reveal stool in the immediate rectal vault. No melena or hematochezia.  Differential diagnosis includes constipation, small bowel obstruction, fecal impaction, hypotension from hypovolemia/dehydration and poor p.o. intake, infectious etiology such as urinary tract infection, intra-abdominal infection.  Patient's  blood pressure improved with a 1 L IV fluid bolus.  Laboratory evaluation significant for CBC with a leukocytosis of 13.7, no anemia, CBG 113, hypomagnesemia to 1.4, COVID-19, influenza, RSV PCR testing negative, BMP with evidence of an AKI with a serum creatinine of 2.41 and an elevated BUN of 29, urinalysis with evidence of urinary tract infection with moderate leukocytes, greater than 50 WBCs and many bacteria, initial lactic acid was elevated to 6.5, repeat downtrending to 3.7 and subsequently resolved to 1.3 following just 1 L of IV fluids.  Low concern for sepsis at this time as patient not meeting SIRS criteria, afebrile, not tachycardic, not tachypneic with only source of infection being possibly genitourinary versus stercoral colitis given the patient's report of constipation.  Abdomen was soft, nontender, nondistended,  no rebound or guarding and she was in no respiratory distress with no complaint of any pain.  Her CT abdomen pelvis was performed without contrast given her AKI which revealed a new aneurysm that was unknown to family members.  A CT head was performed which was unremarkable for acute findings.  Patient CT abdomen pelvis revealed the following: IMPRESSION:  1. Prominent rectal stool ball about 480 cc in volume, with  perirectal stranding raising the possibility of stercoral colitis.  2. 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 3.3 cm  infrarenal abdominal aortic aneurysm. There is some mild  indistinctness of tissue planes between the abdominal aorta and the  IVC although skeptical that this would be a subtle sign of sentinel  bleed particularly given that the aneurysm in this region is the  smaller component. Recommend referral to a vascular specialist. This  recommendation follows ACR consensus guidelines: White Paper of the  ACR Incidental Findings Committee II on Vascular Findings. J Am Coll  Radiol 2013; 10:789-794.  3. Mild to moderate cardiomegaly with aortic  valve calcification.  Coronary and systemic atherosclerosis.  4. Multiple hypodense liver lesions are fluid density and likely  cysts although some are technically too small to characterize.  5. Dependent density in the gallbladder is probably from sludge  although gallstones cannot be excluded.  6. Lower thoracic and lumbar spondylosis and degenerative disc  disease causing left greater than right foraminal impingement at  L5-S1.    Aortic Atherosclerosis (ICD10-I70.0).    Given this finding, I consulted vascular surgery and CTA chest abdomen pelvis was ordered to further evaluate.  Dr. Unk Lightning of vascular surgery evaluated the patient.  She is not a surgical candidate for any type of aneurysmal repair based on her age, medical comorbidities.    CTA dissection study revealed the following: IMPRESSION:  1. Aortic atherosclerosis with multifocal aneurysmal dilatation of  the thoracic and abdominal aorta. 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 A small penetrating ulcer is also noted in this region.  Aneurysmal dilatation of the ascending aorta measuring 4.2 cm and  descending aorta measuring 5.3 cm. Partially calcified dissection  flap is noted in the descending thoracic aorta. Cardiothoracic  surgery consultation recommended due to increased risk of rupture  for arch aneurysm ? 5.5 cm. This recommendation follows 2010  ACCF/AHA/AATS/ACR/ASA/SCA/SCAI/SIR/STS/SVM Guidelines for the  Diagnosis and Management of Patients With Thoracic Aortic Disease.  Circulation. 2010; 121: U440-H474. Aortic aneurysm NOS (ICD10-I71.9)  2. Multifocal aneurysmal dilatation of the abdominal aorta measuring  up to 5.8 cm in the proximal abdominal aorta. Atherosclerotic  calcification of the common iliac arteries bilaterally with  aneurysmal dilatation of the common iliac  arteries measuring up to  2.7 cm on the left. Recommend referral to a vascular specialist.  This recommendation follows ACR consensus guidelines: White Paper of  the ACR Incidental Findings Committee II on Vascular Findings. J Am  Coll Radiol 2013; 10:789-794.  3. 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  4. 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  5. Large amount of stool in the rectum with rectal wall thickening  and perirectal fat stranding, suggesting stercoral colitis.  6. Cardiomegaly with coronary artery calcifications.  7. Remaining chronic findings as described above.   I spoke with on-call cardiothoracic surgery.  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  After discussion with vascular surgery as well, Dr. Sherral Hammers this appears to be chronic.  Per Dr. Nevin Bloodgood, no surgical intervention recommended, recommended medical management.  Given the findings of cervical colitis as well on CT, I reached out to general surgery who recommended no immediate intervention, gastroenterology consultation for bowel regimen as there is no evidence for perforation on CT imaging.  For the patient with IV Rocephin and Flagyl for antibiosis.  On repeat assessment, the patient was asymptomatic, resting comfortably, in no distress.   After discussion with family members bedside, the patient's CODE STATUS was changed to DNR.  I consulted gastroenterology given the patient's findings of stercoral colitis. Palliative care consult was placed.  I spoke with Dr. Julian Reil of hospitalist medicine who accepted the patient in admission.   Final Clinical Impression(s) / ED Diagnoses Final diagnoses:  Hypotension, unspecified hypotension type  Stercoral colitis  Acute cystitis without hematuria  Hypomagnesemia  Dissection of thoracoabdominal aorta (HCC)  AKI (acute kidney injury)  (HCC)  Lactic acidosis  Thoracoabdominal aortic aneurysm (TAAA) without rupture, unspecified part [facility])    Rx / DC Orders ED Discharge Orders     None         Ernie Avena, MD 07/13/22 [phone]

## 2022-07-13 DIAGNOSIS — N390 Urinary tract infection, site not specified: Secondary | ICD-10-CM

## 2022-07-13 DIAGNOSIS — I7103 Dissection of thoracoabdominal aorta: Secondary | ICD-10-CM | POA: Diagnosis not present

## 2022-07-13 DIAGNOSIS — Z515 Encounter for palliative care: Secondary | ICD-10-CM | POA: Diagnosis not present

## 2022-07-13 DIAGNOSIS — A419 Sepsis, unspecified organism: Secondary | ICD-10-CM | POA: Diagnosis not present

## 2022-07-13 DIAGNOSIS — G309 Alzheimer's disease, unspecified: Secondary | ICD-10-CM | POA: Diagnosis not present

## 2022-07-13 DIAGNOSIS — Z7189 Other specified counseling: Secondary | ICD-10-CM

## 2022-07-13 LAB — CBC
HCT: 39.6 % (ref 36.0–46.0)
Hemoglobin: 12.4 g/dL (ref 12.0–15.0)
MCH: 28.2 pg (ref 26.0–34.0)
MCHC: 31.3 g/dL (ref 30.0–36.0)
MCV: 90 fL (ref 80.0–100.0)
Platelets: 134 10*3/uL — ABNORMAL LOW (ref 150–400)
RBC: 4.4 MIL/uL (ref 3.87–5.11)
RDW: 14.6 % (ref 11.5–15.5)
WBC: 12.7 10*3/uL — ABNORMAL HIGH (ref 4.0–10.5)
nRBC: 0 % (ref 0.0–0.2)

## 2022-07-13 LAB — COMPREHENSIVE METABOLIC PANEL
ALT: 8 U/L (ref 0–44)
AST: 23 U/L (ref 15–41)
Albumin: 2.9 g/dL — ABNORMAL LOW (ref 3.5–5.0)
Alkaline Phosphatase: 51 U/L (ref 38–126)
Anion gap: 16 — ABNORMAL HIGH (ref 5–15)
BUN: 25 mg/dL — ABNORMAL HIGH (ref 8–23)
CO2: 24 mmol/L (ref 22–32)
Calcium: 8.5 mg/dL — ABNORMAL LOW (ref 8.9–10.3)
Chloride: 101 mmol/L (ref 98–111)
Creatinine, Ser: 1.69 mg/dL — ABNORMAL HIGH (ref 0.44–1.00)
GFR, Estimated: 30 mL/min — ABNORMAL LOW (ref >60)
Glucose, Bld: 93 mg/dL (ref 70–99)
Potassium: 3.3 mmol/L — ABNORMAL LOW (ref 3.5–5.1)
Sodium: 141 mmol/L (ref 135–145)
Total Bilirubin: 1.1 mg/dL (ref 0.3–1.2)
Total Protein: 7 g/dL (ref 6.5–8.1)

## 2022-07-13 LAB — LACTIC ACID, PLASMA: Lactic Acid, Venous: 2.4 mmol/L (ref 0.5–1.9)

## 2022-07-13 MED ORDER — DONEPEZIL HCL 5 MG PO TABS
10.0000 mg | ORAL_TABLET | Freq: Every day | ORAL | Status: DC
  Administered 2022-07-13 – 2022-07-18 (×6): 10 mg via ORAL
  Filled 2022-07-13 (×7): qty 2

## 2022-07-13 MED ORDER — CEFTRIAXONE SODIUM 1 G IJ SOLR
1.0000 g | INTRAMUSCULAR | Status: DC
  Administered 2022-07-13: 1 g via INTRAMUSCULAR
  Filled 2022-07-13 (×2): qty 10

## 2022-07-13 MED ORDER — MEMANTINE HCL ER 28 MG PO CP24
28.0000 mg | ORAL_CAPSULE | Freq: Every day | ORAL | Status: DC
  Administered 2022-07-13 – 2022-07-18 (×6): 28 mg via ORAL
  Filled 2022-07-13 (×6): qty 1

## 2022-07-13 MED ORDER — BISACODYL 10 MG RE SUPP
10.0000 mg | Freq: Once | RECTAL | Status: AC
  Administered 2022-07-13: 10 mg via RECTAL
  Filled 2022-07-13: qty 1

## 2022-07-13 MED ORDER — POTASSIUM CHLORIDE CRYS ER 20 MEQ PO TBCR
40.0000 meq | EXTENDED_RELEASE_TABLET | Freq: Once | ORAL | Status: AC
  Administered 2022-07-13: 40 meq via ORAL
  Filled 2022-07-13: qty 2

## 2022-07-13 NOTE — Progress Notes (Signed)
PROGRESS NOTE    Carol [name]  VQQ:[id number] DOB: 1935-08-30 DOA: 07/12/2022 PCP: Sandrea Hughs, NP   Brief Narrative:    Carol [name] is a 87 y.o. female with medical history significant of Dementia, HTN, constipation. In to ED today with several days of being unable to have a BM.  Laxatives and enemas at home.   During ED work up pt also found to have: 7+ cm AAA and TAAA. Type B aortic Dissection. Initial hypotension AKI Lactic acidosis.  She was admitted for sepsis secondary to UTI and is also noted to have AKI and incidentally a aortic aneurysm with dissection.  She was seen by vascular surgery and this appears to be a chronic issue and patient does not appear to be a surgical candidate.  She has pulled out multiple IVs this morning and therefore IV fluid and IV antibiotics discontinued.  Palliative consultation pending.  Assessment & Plan:   Principal Problem:   Sepsis secondary to UTI [facility]) Active Problems:   AKI (acute kidney injury) ([place])   Aortic dissection distal to left subclavian (HCC)   Aortic aneurysm (HCC)   Mixed Alzheimer's and vascular dementia ([place])   Essential hypertension, benign   Constipation  Assessment and Plan:  Sepsis secondary to UTI ([place]) Technically only 1/4 SIRS criteria at this point, but lactic acidosis + hypotension on presentation + UTI. Treated as sepsis. Sepsis pathway Rocephin, now IM as IV's pulled out Serial lactates -> trending down and BP up after IVF Tele monitor Cultures pending   AKI (acute kidney injury) (HCC)-improving Likely pre-renal / ATN in setting of initial BPs in the 70s, poor PO intake. IVF discontinued Strict intake and output Repeat BMP in AM   Aortic aneurysm [facility]) Not candidate for surgery per CVTS and Dr. Virl Cagey. Is at high risk for rupture given size. Pt family interested in pal care discussion, not pushing for surgery.   Aortic dissection distal to left subclavian [facility]) New  diagnosis today; however, appears old and calcified on CT images per rads and vascular surgery.  Dr. Unk Lightning doesn't think this is an acute issue.   Constipation-resolved 2 bowel movements noted today   Essential hypertension, benign Cont home norvasc Hold home lisinopril in setting of AKI PRN hydralazine for SBP > 160 or DBP > 100   Mixed Alzheimer's and vascular dementia ([place]) Pt with fairly advanced dementia at baseline. Cont home dementia meds Family confirmed DNR status to EDP. Pal care consulted at family request.  Hypokalemia/hypomagnesemia Replete and reevaluate in a.m.  DVT prophylaxis:Lovenox Code Status: DNR Family Communication: None at bedside Disposition Plan:  Status is: Inpatient Remains inpatient appropriate because: Need for ongoing evaluation and management  Consultants:  Vascular surgery Palliative  Procedures:  None  Antimicrobials:  Anti-infectives (From admission, onward)    Start     Dose/Rate Route Frequency Ordered Stop   07/13/22 1115  cefTRIAXone (ROCEPHIN) injection 1 g        1 g Intramuscular Every 24 hours 07/13/22 1022     07/12/22 2330  metroNIDAZOLE (FLAGYL) IVPB 500 mg        500 mg 100 mL/hr over 60 Minutes Intravenous  Once 07/12/22 2324 07/13/22 0119   07/12/22 1900  cefTRIAXone (ROCEPHIN) 2 g in sodium chloride 0.9 % 100 mL IVPB  Status:  Discontinued        2 g 200 mL/hr over 30 Minutes Intravenous Every 24 hours 07/12/22 1845 07/13/22 1022       Subjective:  Patient seen and evaluated today with no new acute complaints or concerns. No acute concerns or events noted overnight.  She seems calm and sleepy.  Objective: Vitals:   07/12/22 2335 07/12/22 2359 07/13/22 0056 07/13/22 0415  BP:  (!) 148/94 136/83 125/79  Pulse:  82 89 87  Resp:  17 15 20   Temp: 97.9 F (36.6 C)  97.7 F (36.5 C) 97.7 F (36.5 C)  TempSrc: Axillary  Oral Oral  SpO2:  96% 100% 99%  Weight:      Height:        Intake/Output Summary (Last  24 hours) at 07/13/2022 1035 Last data filed at 07/13/2022 0118 Gross per 24 hour  Intake 698.85 ml  Output --  Net 698.85 ml   Filed Weights   07/12/22 1449  Weight: 77.2 kg    Examination:  General exam: Appears calm and comfortable  Respiratory system: Clear to auscultation. Respiratory effort normal. Cardiovascular system: S1 & S2 heard, RRR.  Gastrointestinal system: Abdomen is soft Central nervous system: Alert and awake Extremities: No edema Skin: No significant lesions noted Psychiatry: Flat affect.    Data Reviewed: I have personally reviewed following labs and imaging studies  CBC: Recent Labs  Lab 07/12/22 1444 07/13/22 0109  WBC 13.7* 12.7*  HGB 12.6 12.4  HCT 40.1 39.6  MCV 91.1 90.0  PLT 115* 134*   Basic Metabolic Panel: Recent Labs  Lab 07/12/22 1444 07/12/22 1818 07/13/22 0109  NA 139  --  141  K 4.1  --  3.3*  CL 102  --  101  CO2 20*  --  24  GLUCOSE 125*  --  93  BUN 29*  --  25*  CREATININE 2.41*  --  1.69*  CALCIUM 8.8*  --  8.5*  MG  --  1.4*  --    GFR: Estimated Creatinine Clearance: 24.9 mL/min (A) (by C-G formula based on SCr of 1.69 mg/dL (H)). Liver Function Tests: Recent Labs  Lab 07/13/22 0109  AST 23  ALT 8  ALKPHOS 51  BILITOT 1.1  PROT 7.0  ALBUMIN 2.9*   No results for input(s): "LIPASE", "AMYLASE" in the last 168 hours. No results for input(s): "AMMONIA" in the last 168 hours. Coagulation Profile: No results for input(s): "INR", "PROTIME" in the last 168 hours. Cardiac Enzymes: No results for input(s): "CKTOTAL", "CKMB", "CKMBINDEX", "TROPONINI" in the last 168 hours. BNP (last 3 results) No results for input(s): "PROBNP" in the last 8760 hours. HbA1C: No results for input(s): "HGBA1C" in the last 72 hours. CBG: Recent Labs  Lab 07/12/22 1503  GLUCAP 113*   Lipid Profile: No results for input(s): "CHOL", "HDL", "LDLCALC", "TRIG", "CHOLHDL", "LDLDIRECT" in the last 72 hours. Thyroid Function Tests: No  results for input(s): "TSH", "T4TOTAL", "FREET4", "T3FREE", "THYROIDAB" in the last 72 hours. Anemia Panel: No results for input(s): "VITAMINB12", "FOLATE", "FERRITIN", "TIBC", "IRON", "RETICCTPCT" in the last 72 hours. Sepsis Labs: Recent Labs  Lab 07/12/22 1504 07/12/22 1818 07/12/22 2310 07/13/22 0109  LATICACIDVEN 6.5* 3.7* 1.3 2.4*    Recent Results (from the past 240 hour(s))  Blood culture (routine x 2)     Status: None (Preliminary result)   Collection Time: 07/12/22  3:25 PM   Specimen: BLOOD  Result Value Ref Range Status   Specimen Description BLOOD SITE NOT SPECIFIED  Final   Special Requests   Final    BOTTLES DRAWN AEROBIC AND ANAEROBIC Blood Culture adequate volume   Culture   Final  NO GROWTH < 24 HOURS Performed at [facility], [address] [place], [place] [address]    Report Status PENDING  Incomplete  Resp panel by RT-PCR (RSV, Flu A&B, Covid) Anterior Nasal Swab     Status: None   Collection Time: 07/12/22  5:35 PM   Specimen: Anterior Nasal Swab  Result Value Ref Range Status   SARS Coronavirus 2 by RT PCR NEGATIVE NEGATIVE Final   Influenza A by PCR NEGATIVE NEGATIVE Final   Influenza B by PCR NEGATIVE NEGATIVE Final    Comment: (NOTE) The Xpert Xpress SARS-CoV-2/FLU/RSV plus assay is intended as an aid in the diagnosis of influenza from Nasopharyngeal swab specimens and should not be used as a sole basis for treatment. Nasal washings and aspirates are unacceptable for Xpert Xpress SARS-CoV-2/FLU/RSV testing.  Fact Sheet for Patients: BloggerCourse.com  Fact Sheet for Healthcare Providers: SeriousBroker.it  This test is not yet approved or cleared by the [place] FDA and has been authorized for detection and/or diagnosis of SARS-CoV-2 by FDA under an Emergency Use Authorization (EUA). This EUA will remain in effect (meaning this test can be used) for the duration of the COVID-19  declaration under Section 564(b)(1) of the Act, 21 U.S.C. section 360bbb-3(b)(1), unless the authorization is terminated or revoked.     Resp Syncytial Virus by PCR NEGATIVE NEGATIVE Final    Comment: (NOTE) Fact Sheet for Patients: BloggerCourse.com  Fact Sheet for Healthcare Providers: SeriousBroker.it  This test is not yet approved or cleared by the [place] FDA and has been authorized for detection and/or diagnosis of SARS-CoV-2 by FDA under an Emergency Use Authorization (EUA). This EUA will remain in effect (meaning this test can be used) for the duration of the COVID-19 declaration under Section 564(b)(1) of the Act, 21 U.S.C. section 360bbb-3(b)(1), unless the authorization is terminated or revoked.  Performed at [facility], [address] [place], [place] [address]          Radiology Studies: CT Angio Chest/Abd/Pel for Dissection W and/or Wo Contrast  Addendum Date: 07/12/2022   ADDENDUM REPORT: 07/12/2022 21:55 ADDENDUM: Critical Value/emergent results were called by telephone at the time of interpretation on 07/12/2022 at 9:53 pm to provider Ernie Avena , who verbally acknowledged these results. Electronically Signed   By: Thornell Sartorius M.D.   On: 07/12/2022 21:55   Result Date: 07/12/2022 CLINICAL DATA:  Acute aortic syndrome suspected. Thoracoabdominal aortic aneurysm follow-up. EXAM: CT ANGIOGRAPHY CHEST, ABDOMEN AND PELVIS TECHNIQUE: Non-contrast CT of the chest was initially obtained. Multidetector CT imaging through the chest, abdomen and pelvis was performed using the standard protocol during bolus administration of intravenous contrast. Multiplanar reconstructed images and MIPs were obtained and reviewed to evaluate the vascular anatomy. RADIATION DOSE REDUCTION: This exam was performed according to the departmental dose-optimization program which includes automated exposure control, adjustment of the  mA and/or kV according to patient size and/or use of iterative reconstruction technique. CONTRAST:  81mL OMNIPAQUE IOHEXOL 350 MG/ML SOLN COMPARISON:  07/12/2022. FINDINGS: CTA CHEST FINDINGS Cardiovascular: Heart is enlarged and there is no pericardial effusion. Three-vessel coronary artery calcifications are seen. There is atherosclerotic calcification of the aorta with a calcified dissection flap originating in the proximal aortic arch at the origin of the left subclavian artery. Calcified dissection flap is also noted in the mid to distal abdominal aorta. There is aneurysmal dilatation of the ascending aorta measuring 4.2 cm. There is aneurysmal dilatation of the aortic arch measuring up to 7.2  cm there is aneurysmal dilatation of the descending thoracic aorta measuring 5.3 cm. A penetrating ulceration is noted at the aortic arch. There is a focal aneurysm of the ascending aorta measuring 2.7 cm. Mediastinum/Nodes: No mediastinal, hilar, or axillary lymphadenopathy. The thyroid gland, trachea, and esophagus are within normal limits. Lungs/Pleura: Paraseptal and centrilobular emphysematous changes are present in the lungs. Atelectasis is present bilaterally. No effusion or pneumothorax. Musculoskeletal: Degenerative changes are present in the thoracic spine. No acute or suspicious osseous abnormality. Review of the MIP images confirms the above findings. CTA ABDOMEN AND PELVIS FINDINGS VASCULAR Aorta: Aortic atherosclerosis with fusiform aneurysmal dilatation of the proximal abdominal aorta measuring measuring 5.8 x 5.5 cm. There is aneurysmal dilatation of the distal abdominal aorta measuring 3.4 x 3.2 cm. Celiac: Patent without evidence of aneurysm, dissection, vasculitis or significant stenosis. SMA: Patent without evidence of aneurysm, dissection, vasculitis or significant stenosis. Renals: Both renal arteries are patent without evidence of aneurysm, dissection, vasculitis, fibromuscular dysplasia or  significant stenosis. IMA: Patent. Inflow: Atherosclerotic calcification with mural thrombus and aneurysmal dilatation of the common iliac arteries measuring 2.2 cm on the right and 2.7 cm on the left. There is aneurysmal dilatation of the proximal internal iliac artery on the left with thrombus and occlusion of the external iliac artery on the left. There is moderate-to-severe stenosis of the proximal external iliac arteries bilaterally. Veins: No obvious venous abnormality within the limitations of this arterial phase study. Review of the MIP images confirms the above findings. NON-VASCULAR Hepatobiliary: Hypodensities are present in the liver, most likely representing cysts or hemangiomas. No biliary ductal dilatation. A small stone is present in the gallbladder. Pancreas: Unremarkable. No pancreatic ductal dilatation or surrounding inflammatory changes. Spleen: Normal in size without focal abnormality. Adrenals/Urinary Tract: The adrenal glands are within normal limits. Renal cysts are present bilaterally. The kidneys enhance symmetrically. No renal calculus or hydronephrosis. The bladder is unremarkable. Stomach/Bowel: Stomach is within normal limits. Appendix appears normal. No evidence of bowel wall thickening, distention, or inflammatory changes. No free air or pneumatosis. Large amount of retained stool is present in the rectum with rectal wall thickening and perirectal fat stranding. Lymphatic: No abdominal or pelvic lymphadenopathy. Reproductive: Uterus and bilateral adnexa are unremarkable. Other: No abdominopelvic ascites. Musculoskeletal: Degenerative changes are present in the lumbar spine and right hip. No acute osseous abnormality. Review of the MIP images confirms the above findings. IMPRESSION: 1. Aortic atherosclerosis with multifocal aneurysmal dilatation of the thoracic and abdominal aorta. There is aneurysmal dilatation of the aortic arch measuring up to 7.2 cm with a partially calcified  dissection flap originating at the origin of the left subclavian artery. A small penetrating ulcer is also noted in this region. Aneurysmal dilatation of the ascending aorta measuring 4.2 cm and descending aorta measuring 5.3 cm. Partially calcified dissection flap is noted in the descending thoracic aorta. Cardiothoracic surgery consultation recommended due to increased risk of rupture for arch aneurysm ? 5.5 cm. This recommendation follows 2010 ACCF/AHA/AATS/ACR/ASA/SCA/SCAI/SIR/STS/SVM Guidelines for the Diagnosis and Management of Patients With Thoracic Aortic Disease. Circulation. 2010; 121: R427-C623. Aortic aneurysm NOS (ICD10-I71.9) 2. Multifocal aneurysmal dilatation of the abdominal aorta measuring up to 5.8 cm in the proximal abdominal aorta. Atherosclerotic calcification of the common iliac arteries bilaterally with aneurysmal dilatation of the common iliac arteries measuring up to 2.7 cm on the left. Recommend referral to a vascular specialist. This recommendation follows ACR consensus guidelines: White Paper of the ACR Incidental Findings Committee II on Vascular Findings. J Am  Coll Radiol 2013; 10:789-794. 3. 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 4. Moderate-to-severe stenosis involving the origins of the external iliac arteries bilaterally. 5. Large amount of stool in the rectum with rectal wall thickening and perirectal fat stranding, suggesting stercoral colitis. 6. Cardiomegaly with coronary artery calcifications. 7. Remaining chronic findings as described above. Electronically Signed: By: Brett Fairy M.D. On: 07/12/2022 21:49   CT ABDOMEN PELVIS WO CONTRAST  Result Date: 07/12/2022 CLINICAL DATA:  Weakness, lethargy, reduced oral intake, hypotension EXAM: CT ABDOMEN AND PELVIS WITHOUT CONTRAST TECHNIQUE: Multidetector CT imaging of the abdomen and pelvis was performed following the standard protocol without IV contrast. RADIATION DOSE REDUCTION: This exam was performed  according to the departmental dose-optimization program which includes automated exposure control, adjustment of the mA and/or kV according to patient size and/or use of iterative reconstruction technique. COMPARISON:  Abdomen radiograph from 07/27/2018 FINDINGS: Lower chest: Mild to moderate cardiomegaly. Aortic and coronary artery atherosclerotic vascular disease. Substantial tortuosity of the lower descending thoracic aorta. Suprarenal aortic aneurysm at the hiatus is fusiform in shape and measures 5.8 by 5.7 cm on image 18 series 3. Aortic valve calcification noted. Mild dependent atelectasis in the lower lobes, right greater than left. Hepatobiliary: Multiple hypodense liver lesions are fluid density and likely cysts although some are technically too small to characterize. Dependent density in the gallbladder is probably from sludge although gallstones cannot be excluded. No biliary dilatation. Pancreas: Unremarkable Spleen: Unremarkable Adrenals/Urinary Tract: Multiple fluid density lesions of both kidneys compatible with simple cysts. No definite adrenal lesion although the right adrenal gland is somewhat indistinct. Empty urinary bladder. Stomach/Bowel: Prominent rectal stool ball about 480 cc in volume, with perirectal stranding raising the possibility of stercoral colitis. Scattered air-fluid levels in nondilated small bowel, nonspecific. No small bowel dilatation to suggest small bowel obstruction. Vascular/Lymphatic: In addition to the 5.8 cm suprarenal aneurysm, an infrarenal aneurysm measures about 3.3 cm in diameter. There is some mild indistinctness of tissue planes between the abdominal aorta and the IVC although skeptical that this would be a subtle sign sentinel bleed particularly given that the aneurysm in this region is the smaller component. Right common iliac artery caliber 2.3 cm, left common iliac artery caliber 2.5 cm. Aortoiliac atherosclerotic calcification. There is some calcific plaque  proximally in the SMA. Reproductive: Unremarkable Other: No supplemental non-categorized findings. Musculoskeletal: Lower thoracic and lumbar spondylosis and degenerative disc disease. This causes left greater than right foraminal impingement at the L5-S1 level. IMPRESSION: 1. Prominent rectal stool ball about 480 cc in volume, with perirectal stranding raising the possibility of stercoral colitis. 2. 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 3.3 cm infrarenal abdominal aortic aneurysm. There is some mild indistinctness of tissue planes between the abdominal aorta and the IVC although skeptical that this would be a subtle sign of sentinel bleed particularly given that the aneurysm in this region is the smaller component. Recommend referral to a vascular specialist. This recommendation follows ACR consensus guidelines: White Paper of the ACR Incidental Findings Committee II on Vascular Findings. J Am Coll Radiol 2013; 10:789-794. 3. Mild to moderate cardiomegaly with aortic valve calcification. Coronary and systemic atherosclerosis. 4. Multiple hypodense liver lesions are fluid density and likely cysts although some are technically too small to characterize. 5. Dependent density in the gallbladder is probably from sludge although gallstones cannot be excluded. 6. Lower thoracic and lumbar spondylosis and degenerative disc disease causing left greater than right foraminal impingement at L5-S1. Aortic Atherosclerosis (  ICD10-I70.0). Electronically Signed   By: Gaylyn Rong M.D.   On: 07/12/2022 18:51   CT HEAD WO CONTRAST ( )  Result Date: 07/12/2022 CLINICAL DATA:  Altered mental status. Weakness and lethargy with reduced oral intake. Hypotension. EXAM: CT HEAD WITHOUT CONTRAST TECHNIQUE: Contiguous axial images were obtained from the base of the skull through the vertex without intravenous contrast. RADIATION DOSE REDUCTION: This exam was performed according to the departmental dose-optimization  program which includes automated exposure control, adjustment of the mA and/or kV according to patient size and/or use of iterative reconstruction technique. COMPARISON:  01/02/2018 FINDINGS: Brain: The brainstem, cerebellum, cerebral peduncles, thalami, basal ganglia, basilar cisterns, and ventricular system appear within normal limits. Periventricular white matter and corona radiata hypodensities favor chronic ischemic microvascular white matter disease. No intracranial hemorrhage, mass lesion, or acute CVA. Vascular: There is atherosclerotic calcification of the cavernous carotid arteries bilaterally. Skull: Unremarkable Sinuses/Orbits: Chronic right maxillary and mild chronic ethmoid sinusitis. The mucoperiosteal thickening in the right maxillary sinus has speckled internal calcification and fungal involvement is not excluded. This appearance is similar to prior. Other: No supplemental non-categorized findings. IMPRESSION: 1. No acute intracranial findings. 2. Periventricular white matter and corona radiata hypodensities favor chronic ischemic microvascular white matter disease. 3. Atherosclerosis. 4. Chronic right maxillary and mild chronic ethmoid sinusitis. Electronically Signed   By: Gaylyn Rong M.D.   On: 07/12/2022 18:39   DG Chest Portable 1 View  Result Date: 07/12/2022 CLINICAL DATA:  Altered mental status EXAM: PORTABLE CHEST 1 VIEW COMPARISON:  07/09/2018 FINDINGS: Cardiomegaly. Aneurysmal appearance of the aortic arch, which appears enlarged in comparison to prior examination dated 07/09/2018. Both lungs are clear. The visualized skeletal structures are unremarkable. IMPRESSION: 1. Aneurysmal appearance of the aortic arch, which appears enlarged in comparison to prior examination dated 07/09/2018. Recommend CT to further evaluate if clinically appropriate. 2. Cardiomegaly. 3. No acute abnormality of the lungs. Electronically Signed   By: Jearld Lesch M.D.   On: 07/12/2022 15:53         Scheduled Meds:  amLODipine  2.5 mg Oral BID   atorvastatin  20 mg Oral Daily   cefTRIAXone (ROCEPHIN) IM  1 g Intramuscular Q24H   donepezil  10 mg Oral Daily   enoxaparin (LOVENOX) injection  30 mg Subcutaneous Q24H   memantine  28 mg Oral Daily   multivitamin with minerals  1 tablet Oral Q supper   polyethylene glycol  17 g Oral Daily   potassium chloride  40 mEq Oral Once   QUEtiapine  100 mg Oral BID   senna-docusate  1 tablet Oral QHS   Continuous Infusions:  lactated ringers       LOS: 1 day    Time spent: 35 minutes    Wannetta Langland Hoover Brunette, DO [facility]  If 7PM-7AM, please contact night-coverage [email or web address] 07/13/2022, 10:35 AM

## 2022-07-13 NOTE — Progress Notes (Signed)
Pt seen this morning. Denies abdominal back or chest pain.   Appreciate palliative care involvement.  Will follow peripherally.   Broadus John MD

## 2022-07-13 NOTE — Consult Note (Signed)
Palliative Care Consult Note                                  Date: 07/13/2022   Patient Name: Carol [name]  DOB: [birth year]  MRN: [id number]  Age / Sex: 87 y.o., female  PCP: Ngetich, Nelda Bucks, NP Referring Physician: Rodena Goldmann, DO  Reason for Consultation: Establishing goals of care  HPI/Patient Profile: 87 y.o. female  with past medical history of Carol [name], hypertension, and chronic constipation who presented to [facility] ED on 07/12/2022 with several days of being unable to have a BM.  She was admitted for sepsis secondary to UTI and AKI.  She was incidentally found to have an aortic aneurysm with dissection.  Per vascular surgery, this appears to be a chronic issue and she does not appear to be a surgical candidate.  Palliative Medicine was consulted for goals of care.   Subjective:   I have reviewed medical records including progress notes, labs and imaging, and assessed the patient at bedside.   I met with daughters Carol [name], Carol [name], and Carol [name]) in the 4E waiting room to discuss diagnosis, prognosis, GOC, EOL wishes, disposition, and options.  I introduced Palliative Medicine as specialized medical care for people living with serious illness. It focuses on providing relief from the symptoms and stress of a serious illness.   A brief life review was discussed. Carol [name] is originally from the [place] area. Her husband was in [place], so they lived in many different places.   At baseline, patient is ambulatory with a rolling walker. She needs assistance with bADLs, including bathing and dressing. The family "takes shifts" to ensure that [name] has 24/7 supervision.  They also have private caregivers.   We discussed patient's current medical situation and what it means in the larger context of her ongoing co-morbidities. Current clinical status was reviewed. Family verbalizes understanding of incidental finding of  thoracoabdominal aneurysm with dissection. They understand this ultimately could be a life-limiting condition, although timing for this is unknown. They also understand patient is likely not a surgical candidate, and are not interested in pursuing elective repair.   Created space and opportunity for family to express thoughts regarding patient's current medical situation.Values and goals of care were attempted to be elicited.  Family's goal is for [name] to be at home, so they can "love on her" for as long as possible.   I discussed with family my recommendation to consider the addition of hospice support at home. I explained that hospice would be personal care for patient and assistance with symptom management if she declines. Also discussed option to start with outpatient palliative, then transition to hospice when ready. Family understands and will consider.   Questions and concerns addressed.    Review of Systems  All other systems reviewed and are negative.   Objective:   Primary Diagnoses: Present on Admission:  Mixed Alzheimer's and vascular dementia ([place])  AKI (acute kidney injury) ([place])  Sepsis secondary to UTI [facility])  Aortic dissection distal to left subclavian [facility])  Essential hypertension, benign  Aortic aneurysm ([place])  Constipation   Physical Exam Vitals reviewed.  Constitutional:      General: She is not in acute distress.    Comments: Frail, chronically ill-appearing  Pulmonary:     Effort: Pulmonary effort is normal.  Neurological:     Mental Status: She is alert.  Comments: Oriented to person, place  Psychiatric:        Cognition and Memory: Cognition is impaired.     Vital Signs:  BP 125/77 (BP Location: Left Wrist)   Pulse 83   Temp 98.2 F (36.8 C) (Axillary)   Resp 16   Ht 5\' 4"  (1.626 m)   Wt 77.2 kg   SpO2 99%   BMI 29.21 kg/m   Palliative Assessment/Data: PPS 40%     Assessment & Plan:   SUMMARY OF RECOMMENDATIONS   Continue  current supportive interventions Family is not interested in pursuing surgical intervention Family considering home hospice versus outpatient palliative PMT will follow up tomorrow  Primary Decision Maker: 4 daughters together  Code Status/Advance Care Planning: DNR  Prognosis:  < 6 months would not be surprising  Discharge Planning:  To Be Determined    Thank you for allowing [place] to participate in the care of [name]   Signed by: Elie Confer, NP Palliative Medicine Team  Team Phone # [phone]  For individual providers, please see AMION

## 2022-07-13 NOTE — ED Notes (Signed)
ED TO INPATIENT HANDOFF REPORT  ED Nurse Name and Phone #: [phone] Name/Age/Gender Carol [name] 87 y.o. female Room/Bed: 017C/017C  Code Status   Code Status: DNR  Home/SNF/Other Home Patient oriented to: self, place, time, and situation Is this baseline? Yes   Triage Complete: Triage complete  Chief Complaint Sepsis secondary to UTI ([place]) [A41.9, N39.0]  Triage Note Pt arrived via [facility] from home for weakness, lethargy, decreased oral intakex1wk. Per EMS pt initial bp was 79 systolic. Pt is alert. When I asked pt if she knew where she was or her name, she just keeps saying IDK.  Daughter told EMS, she believes pt is impacted with stool and they tried OTC stuff including enema, but it hasn't helped.   Allergies Allergies  Allergen Reactions   Penicillins Swelling and Rash    Tolerate ceftriaxone    Level of Care/Admitting Diagnosis ED Disposition     ED Disposition  Admit   Condition  --   [place]: [employer] [[id number]]  Level of Care: Progressive [102]  Admit to Progressive based on following criteria: MULTISYSTEM THREATS such as stable sepsis, metabolic/electrolyte imbalance with or without encephalopathy that is responding to early treatment.  May admit patient to [facility] or [facility] if equivalent level of care is available:: No  Covid Evaluation: Asymptomatic - no recent exposure (last 10 days) testing not required  Diagnosis: Sepsis secondary to UTI [facility]) [[id number]]  Admitting Physician: Etta Quill [phone]  Attending Physician: Etta Quill [[id number]]  Certification:: I certify this patient will need inpatient services for at least 2 midnights  Estimated Length of Stay: 3          B Medical/Surgery History Past Medical History:  Diagnosis Date   Anxiety state, unspecified    Cataract    Depressive disorder, not elsewhere classified    Lumbago    Memory loss    Other and unspecified hyperlipidemia     Rickets, late effect    Tobacco use disorder    Unspecified constipation    Unspecified essential hypertension    Varicose veins of lower extremities    Past Surgical History:  Procedure Laterality Date   CYST EXCISION Bilateral    behind ears   MOUTH SURGERY  04/12/2014     A IV Location/Drains/Wounds Patient Lines/Drains/Airways Status     Active Line/Drains/Airways     Name Placement date Placement time Site Days   Peripheral IV 07/12/22 18 G Anterior;Left;Upper Arm 07/12/22  1846  Arm  1   Peripheral IV 07/12/22 20 G 1" Left;Anterior Forearm 07/12/22  2029  Forearm  1   External Urinary Catheter 07/09/18  1852  --  1465   External Urinary Catheter 07/12/22  1456  --  1            Intake/Output Last 24 hours  Intake/Output Summary (Last 24 hours) at 07/13/2022 0012 Last data filed at 07/12/2022 1713 Gross per 24 hour  Intake 500 ml  Output --  Net 500 ml    Labs/Imaging Results for orders placed or performed during the hospital encounter of 07/12/22 (from the past 48 hour(s))  Basic metabolic panel     Status: Abnormal   Collection Time: 07/12/22  2:44 PM  Result Value Ref Range   Sodium 139 135 - 145 mmol/L   Potassium 4.1 3.5 - 5.1 mmol/L   Chloride 102 98 - 111 mmol/L   CO2 20 (L) 22 - 32 mmol/L  Glucose, Bld 125 (H) 70 - 99 mg/dL    Comment: Glucose reference range applies only to samples taken after fasting for at least 8 hours.   BUN 29 (H) 8 - 23 mg/dL   Creatinine, Ser 8.27 (H) 0.44 - 1.00 mg/dL   Calcium 8.8 (L) 8.9 - 10.3 mg/dL   GFR, Estimated 19 (L) >60 mL/min    Comment: (NOTE) Calculated using the CKD-EPI Creatinine Equation (2021)    Anion gap 17 (H) 5 - 15    Comment: Performed at [facility], [address] [place], [place] [address]  CBC     Status: Abnormal   Collection Time: 07/12/22  2:44 PM  Result Value Ref Range   WBC 13.7 (H) 4.0 - 10.5 K/uL   RBC 4.40 3.87 - 5.11 MIL/uL   Hemoglobin 12.6 12.0 - 15.0 g/dL   HCT 54.4  92.0 - 10.0 %   MCV 91.1 80.0 - 100.0 fL   MCH 28.6 26.0 - 34.0 pg   MCHC 31.4 30.0 - 36.0 g/dL   RDW 71.2 19.7 - 58.8 %   Platelets 115 (L) 150 - 400 K/uL    Comment: REPEATED TO VERIFY   nRBC 0.0 0.0 - 0.2 %    Comment: Performed at [facility], [address], [place], [place] [address]  Urinalysis, Routine w reflex microscopic -Urine, Clean Catch     Status: Abnormal   Collection Time: 07/12/22  2:44 PM  Result Value Ref Range   Color, Urine AMBER (A) YELLOW    Comment: BIOCHEMICALS MAY BE AFFECTED BY COLOR   APPearance CLOUDY (A) CLEAR   Specific Gravity, Urine 1.020 1.005 - 1.030   pH 5.0 5.0 - 8.0   Glucose, UA NEGATIVE NEGATIVE mg/dL   Hgb urine dipstick NEGATIVE NEGATIVE   Bilirubin Urine NEGATIVE NEGATIVE   Ketones, ur 5 (A) NEGATIVE mg/dL   Protein, ur 826 (A) NEGATIVE mg/dL   Nitrite NEGATIVE NEGATIVE   Leukocytes,Ua MODERATE (A) NEGATIVE   RBC / HPF 11-20 0 - 5 RBC/hpf   WBC, UA >50 0 - 5 WBC/hpf   Bacteria, UA MANY (A) NONE SEEN   Squamous Epithelial / HPF 0-5 0 - 5 /HPF   WBC Clumps PRESENT    Mucus PRESENT    Hyaline Casts, UA PRESENT     Comment: Performed at [facility], [address], [place], [place] [address]  Troponin I (High Sensitivity)     Status: Abnormal   Collection Time: 07/12/22  2:44 PM  Result Value Ref Range   Troponin I (High Sensitivity) 24 (H) <18 ng/L    Comment: (NOTE) Elevated high sensitivity troponin I (hsTnI) values and significant  changes across serial measurements may suggest ACS but many other  chronic and acute conditions are known to elevate hsTnI results.  Refer to the "Links" section for chest pain algorithms and additional  guidance. Performed at [facility], [address] [place], [place] [address]   CBG monitoring, ED     Status: Abnormal   Collection Time: 07/12/22  3:03 PM  Result Value Ref Range   Glucose-Capillary 113 (H) 70 - 99 mg/dL    Comment: Glucose reference range applies only to  samples taken after fasting for at least 8 hours.  Lactic acid, plasma     Status: Abnormal   Collection Time: 07/12/22  3:04 PM  Result Value Ref Range   Lactic Acid, Venous 6.5 (HH) 0.5 - 1.9  mmol/L    Comment: CRITICAL RESULT CALLED TO, READ BACK BY AND VERIFIED WITH A. BANKS RN 07/12/22 @1600  BY J. WHITE Performed at [facility], [address] [place], [place]   Resp panel by RT-PCR (RSV, Flu A&B, Covid) Anterior Nasal Swab     Status: None   Collection Time: 07/12/22  5:35 PM   Specimen: Anterior Nasal Swab  Result Value Ref Range   SARS Coronavirus 2 by RT PCR NEGATIVE NEGATIVE   Influenza A by PCR NEGATIVE NEGATIVE   Influenza B by PCR NEGATIVE NEGATIVE    Comment: (NOTE) The Xpert Xpress SARS-CoV-2/FLU/RSV plus assay is intended as an aid in the diagnosis of influenza from Nasopharyngeal swab specimens and should not be used as a sole basis for treatment. Nasal washings and aspirates are unacceptable for Xpert Xpress SARS-CoV-2/FLU/RSV testing.  Fact Sheet for Patients: 09/10/22  Fact Sheet for Healthcare Providers: BloggerCourse.com  This test is not yet approved or cleared by the [employer]iousBro[employer] FDA and has been authorized for detection and/or diagnosis of SARS-CoV-2 by FDA under an Emergency Use Authorization (EUA). This EUA will remain in effect (meaning this test can be used) for the duration of the COVID-19 declaration under Section 564(b)(1) of the Act, 21 U.S.C. section 360bbb-3(b)(1), unless the authorization is terminated or revoked.     Resp Syncytial Virus by PCR NEGATIVE NEGATIVE    Comment: (NOTE) Fact Sheet for Patients: [place]  Fact Sheet for Healthcare Providers: BloggerCourse.com  This test is not yet approved or cleared by the Serious[employer]ker.it FDA and has been authorized for detection and/or diagnosis of SARS-CoV-2  by FDA under an Emergency Use Authorization (EUA). This EUA will remain in effect (meaning this test can be used) for the duration of the COVID-19 declaration under Section 564(b)(1) of the Act, 21 U.S.C. section 360bbb-3(b)(1), unless the authorization is terminated or revoked.  Performed at [facility], [address], [place], [place]   Lactic acid, plasma     Status: Abnormal   Collection Time: 07/12/22  6:18 PM  Result Value Ref Range   Lactic Acid, Venous 3.7 (HH) 0.5 - 1.9 mmol/L    Comment: CRITICAL VALUE NOTED. VALUE IS CONSISTENT WITH PREVIOUSLY REPORTED/CALLED VALUE Performed at [facility], [address], [place], [place]   Troponin I (High Sensitivity)     Status: Abnormal   Collection Time: 07/12/22  6:18 PM  Result Value Ref Range   Troponin I (High Sensitivity) 21 (H) <18 ng/L    Comment: (NOTE) Elevated high sensitivity troponin I (hsTnI) values and significant  changes across serial measurements may suggest ACS but many other  chronic and acute conditions are known to elevate hsTnI results.  Refer to the "Links" section for chest pain algorithms and additional  guidance. Performed at [facility], [address], [place], [place]   Magnesium     Status: Abnormal   Collection Time: 07/12/22  6:18 PM  Result Value Ref Range   Magnesium 1.4 (L) 1.7 - 2.4 mg/dL    Comment: Performed at [facility], [address] [place], [place]  Lactic acid, plasma     Status: None   Collection Time: 07/12/22 11:10 PM  Result Value Ref Range   Lactic Acid, Venous 1.3 0.5 - 1.9 mmol/L    Comment: Performed at [facility], [address], [place], [place]   CT Angio  Chest/Abd/Pel for Dissection W and/or Wo Contrast  Addendum Date: 07/12/2022   ADDENDUM REPORT: 07/12/2022 21:55 ADDENDUM: Critical Value/emergent results were called by telephone at the time of interpretation on 07/12/2022 at 9:53  pm to provider Ernie AvenaJAMES LAWSING , who verbally acknowledged these results. Electronically Signed   By: Thornell SartoriusLaura  Taylor M.D.   On: 07/12/2022 21:55   Result Date: 07/12/2022 CLINICAL DATA:  Acute aortic syndrome suspected. Thoracoabdominal aortic aneurysm follow-up. EXAM: CT ANGIOGRAPHY CHEST, ABDOMEN AND PELVIS TECHNIQUE: Non-contrast CT of the chest was initially obtained. Multidetector CT imaging through the chest, abdomen and pelvis was performed using the standard protocol during bolus administration of intravenous contrast. Multiplanar reconstructed images and MIPs were obtained and reviewed to evaluate the vascular anatomy. RADIATION DOSE REDUCTION: This exam was performed according to the departmental dose-optimization program which includes automated exposure control, adjustment of the mA and/or kV according to patient size and/or use of iterative reconstruction technique. CONTRAST:  80mL OMNIPAQUE IOHEXOL 350 MG/ML SOLN COMPARISON:  07/12/2022. FINDINGS: CTA CHEST FINDINGS Cardiovascular: Heart is enlarged and there is no pericardial effusion. Three-vessel coronary artery calcifications are seen. There is atherosclerotic calcification of the aorta with a calcified dissection flap originating in the proximal aortic arch at the origin of the left subclavian artery. Calcified dissection flap is also noted in the mid to distal abdominal aorta. There is aneurysmal dilatation of the ascending aorta measuring 4.2 cm. There is aneurysmal dilatation of the aortic arch measuring up to 7.2 cm there is aneurysmal dilatation of the descending thoracic aorta measuring 5.3 cm. A penetrating ulceration is noted at the aortic arch. There is a focal aneurysm of the ascending aorta measuring 2.7 cm. Mediastinum/Nodes: No mediastinal, hilar, or axillary lymphadenopathy. The thyroid gland, trachea, and esophagus are within normal limits. Lungs/Pleura: Paraseptal and centrilobular emphysematous changes are present in the lungs.  Atelectasis is present bilaterally. No effusion or pneumothorax. Musculoskeletal: Degenerative changes are present in the thoracic spine. No acute or suspicious osseous abnormality. Review of the MIP images confirms the above findings. CTA ABDOMEN AND PELVIS FINDINGS VASCULAR Aorta: Aortic atherosclerosis with fusiform aneurysmal dilatation of the proximal abdominal aorta measuring measuring 5.8 x 5.5 cm. There is aneurysmal dilatation of the distal abdominal aorta measuring 3.4 x 3.2 cm. Celiac: Patent without evidence of aneurysm, dissection, vasculitis or significant stenosis. SMA: Patent without evidence of aneurysm, dissection, vasculitis or significant stenosis. Renals: Both renal arteries are patent without evidence of aneurysm, dissection, vasculitis, fibromuscular dysplasia or significant stenosis. IMA: Patent. Inflow: Atherosclerotic calcification with mural thrombus and aneurysmal dilatation of the common iliac arteries measuring 2.2 cm on the right and 2.7 cm on the left. There is aneurysmal dilatation of the proximal internal iliac artery on the left with thrombus and occlusion of the external iliac artery on the left. There is moderate-to-severe stenosis of the proximal external iliac arteries bilaterally. Veins: No obvious venous abnormality within the limitations of this arterial phase study. Review of the MIP images confirms the above findings. NON-VASCULAR Hepatobiliary: Hypodensities are present in the liver, most likely representing cysts or hemangiomas. No biliary ductal dilatation. A small stone is present in the gallbladder. Pancreas: Unremarkable. No pancreatic ductal dilatation or surrounding inflammatory changes. Spleen: Normal in size without focal abnormality. Adrenals/Urinary Tract: The adrenal glands are within normal limits. Renal cysts are present bilaterally. The kidneys enhance symmetrically. No renal calculus or hydronephrosis. The bladder is unremarkable. Stomach/Bowel: Stomach is  within normal limits. Appendix appears normal. No evidence of bowel wall thickening, distention, or  inflammatory changes. No free air or pneumatosis. Large amount of retained stool is present in the rectum with rectal wall thickening and perirectal fat stranding. Lymphatic: No abdominal or pelvic lymphadenopathy. Reproductive: Uterus and bilateral adnexa are unremarkable. Other: No abdominopelvic ascites. Musculoskeletal: Degenerative changes are present in the lumbar spine and right hip. No acute osseous abnormality. Review of the MIP images confirms the above findings. IMPRESSION: 1. Aortic atherosclerosis with multifocal aneurysmal dilatation of the thoracic and abdominal aorta. There is aneurysmal dilatation of the aortic arch measuring up to 7.2 cm with a partially calcified dissection flap originating at the origin of the left subclavian artery. A small penetrating ulcer is also noted in this region. Aneurysmal dilatation of the ascending aorta measuring 4.2 cm and descending aorta measuring 5.3 cm. Partially calcified dissection flap is noted in the descending thoracic aorta. Cardiothoracic surgery consultation recommended due to increased risk of rupture for arch aneurysm ? 5.5 cm. This recommendation follows 2010 ACCF/AHA/AATS/ACR/ASA/SCA/SCAI/SIR/STS/SVM Guidelines for the Diagnosis and Management of Patients With Thoracic Aortic Disease. Circulation. 2010; 121: O756-E332. Aortic aneurysm NOS (ICD10-I71.9) 2. Multifocal aneurysmal dilatation of the abdominal aorta measuring up to 5.8 cm in the proximal abdominal aorta. Atherosclerotic calcification of the common iliac arteries bilaterally with aneurysmal dilatation of the common iliac arteries measuring up to 2.7 cm on the left. Recommend referral to a vascular specialist. This recommendation follows ACR consensus guidelines: White Paper of the ACR Incidental Findings Committee II on Vascular Findings. J Am Coll Radiol 2013; 10:789-794. 3. Occlusion of the  internal iliac artery on the left. 4. Moderate-to-severe stenosis involving the origins of the external iliac arteries bilaterally. 5. Large amount of stool in the rectum with rectal wall thickening and perirectal fat stranding, suggesting stercoral colitis. 6. Cardiomegaly with coronary artery calcifications. 7. Remaining chronic findings as described above. Electronically Signed: By: Thornell Sartorius M.D. On: 07/12/2022 21:49   CT ABDOMEN PELVIS WO CONTRAST  Result Date: 07/12/2022 CLINICAL DATA:  Weakness, lethargy, reduced oral intake, hypotension EXAM: CT ABDOMEN AND PELVIS WITHOUT CONTRAST TECHNIQUE: Multidetector CT imaging of the abdomen and pelvis was performed following the standard protocol without IV contrast. RADIATION DOSE REDUCTION: This exam was performed according to the departmental dose-optimization program which includes automated exposure control, adjustment of the mA and/or kV according to patient size and/or use of iterative reconstruction technique. COMPARISON:  Abdomen radiograph from 07/27/2018 FINDINGS: Lower chest: Mild to moderate cardiomegaly. Aortic and coronary artery atherosclerotic vascular disease. Substantial tortuosity of the lower descending thoracic aorta. Suprarenal aortic aneurysm at the hiatus is fusiform in shape and measures 5.8 by 5.7 cm on image 18 series 3. Aortic valve calcification noted. Mild dependent atelectasis in the lower lobes, right greater than left. Hepatobiliary: Multiple hypodense liver lesions are fluid density and likely cysts although some are technically too small to characterize. Dependent density in the gallbladder is probably from sludge although gallstones cannot be excluded. No biliary dilatation. Pancreas: Unremarkable Spleen: Unremarkable Adrenals/Urinary Tract: Multiple fluid density lesions of both kidneys compatible with simple cysts. No definite adrenal lesion although the right adrenal gland is somewhat indistinct. Empty urinary bladder.  Stomach/Bowel: Prominent rectal stool ball about 480 cc in volume, with perirectal stranding raising the possibility of stercoral colitis. Scattered air-fluid levels in nondilated small bowel, nonspecific. No small bowel dilatation to suggest small bowel obstruction. Vascular/Lymphatic: In addition to the 5.8 cm suprarenal aneurysm, an infrarenal aneurysm measures about 3.3 cm in diameter. There is some mild indistinctness of tissue planes between the abdominal  aorta and the IVC although skeptical that this would be a subtle sign sentinel bleed particularly given that the aneurysm in this region is the smaller component. Right common iliac artery caliber 2.3 cm, left common iliac artery caliber 2.5 cm. Aortoiliac atherosclerotic calcification. There is some calcific plaque proximally in the SMA. Reproductive: Unremarkable Other: No supplemental non-categorized findings. Musculoskeletal: Lower thoracic and lumbar spondylosis and degenerative disc disease. This causes left greater than right foraminal impingement at the L5-S1 level. IMPRESSION: 1. Prominent rectal stool ball about 480 cc in volume, with perirectal stranding raising the possibility of stercoral colitis. 2. 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 3.3 cm infrarenal abdominal aortic aneurysm. There is some mild indistinctness of tissue planes between the abdominal aorta and the IVC although skeptical that this would be a subtle sign of sentinel bleed particularly given that the aneurysm in this region is the smaller component. Recommend referral to a vascular specialist. This recommendation follows ACR consensus guidelines: White Paper of the ACR Incidental Findings Committee II on Vascular Findings. J Am Coll Radiol 2013; 10:789-794. 3. Mild to moderate cardiomegaly with aortic valve calcification. Coronary and systemic atherosclerosis. 4. Multiple hypodense liver lesions are fluid density and likely cysts although some are technically too small  to characterize. 5. Dependent density in the gallbladder is probably from sludge although gallstones cannot be excluded. 6. Lower thoracic and lumbar spondylosis and degenerative disc disease causing left greater than right foraminal impingement at L5-S1. Aortic Atherosclerosis (ICD10-I70.0). Electronically Signed   By: Van Clines M.D.   On: 07/12/2022 18:51   CT HEAD WO CONTRAST (5MM)  Result Date: 07/12/2022 CLINICAL DATA:  Altered mental status. Weakness and lethargy with reduced oral intake. Hypotension. EXAM: CT HEAD WITHOUT CONTRAST TECHNIQUE: Contiguous axial images were obtained from the base of the skull through the vertex without intravenous contrast. RADIATION DOSE REDUCTION: This exam was performed according to the departmental dose-optimization program which includes automated exposure control, adjustment of the mA and/or kV according to patient size and/or use of iterative reconstruction technique. COMPARISON:  01/02/2018 FINDINGS: Brain: The brainstem, cerebellum, cerebral peduncles, thalami, basal ganglia, basilar cisterns, and ventricular system appear within normal limits. Periventricular white matter and corona radiata hypodensities favor chronic ischemic microvascular white matter disease. No intracranial hemorrhage, mass lesion, or acute CVA. Vascular: There is atherosclerotic calcification of the cavernous carotid arteries bilaterally. Skull: Unremarkable Sinuses/Orbits: Chronic right maxillary and mild chronic ethmoid sinusitis. The mucoperiosteal thickening in the right maxillary sinus has speckled internal calcification and fungal involvement is not excluded. This appearance is similar to prior. Other: No supplemental non-categorized findings. IMPRESSION: 1. No acute intracranial findings. 2. Periventricular white matter and corona radiata hypodensities favor chronic ischemic microvascular white matter disease. 3. Atherosclerosis. 4. Chronic right maxillary and mild chronic  ethmoid sinusitis. Electronically Signed   By: Van Clines M.D.   On: 07/12/2022 18:39   DG Chest Portable 1 View  Result Date: 07/12/2022 CLINICAL DATA:  Altered mental status EXAM: PORTABLE CHEST 1 VIEW COMPARISON:  07/09/2018 FINDINGS: Cardiomegaly. Aneurysmal appearance of the aortic arch, which appears enlarged in comparison to prior examination dated 07/09/2018. Both lungs are clear. The visualized skeletal structures are unremarkable. IMPRESSION: 1. Aneurysmal appearance of the aortic arch, which appears enlarged in comparison to prior examination dated 07/09/2018. Recommend CT to further evaluate if clinically appropriate. 2. Cardiomegaly. 3. No acute abnormality of the lungs. Electronically Signed   By: Delanna Ahmadi M.D.   On: 07/12/2022 15:53    Pending  Labs [employer] (From admission, onward)     Start     Ordered   07/13/22 0500  CBC  Tomorrow morning,   R        07/12/22 2246   07/13/22 0500  Comprehensive metabolic panel  Tomorrow morning,   R        07/12/22 2246   07/12/22 2249  Urine Culture (for pregnant, neutropenic or urologic patients or patients with an indwelling urinary catheter)  (Urine Labs)  Once,   R       Question:  Indication  Answer:  Sepsis   07/12/22 2248   07/12/22 2053  Lactic acid, plasma  Now then every 2 hours,   R      07/12/22 2052   07/12/22 1504  Blood culture (routine x 2)  BLOOD CULTURE X 2,   R      07/12/22 1504            Vitals/Pain Today's Vitals   07/12/22 2045 07/12/22 2135 07/12/22 2335 07/12/22 2359  BP: (!) 149/92   (!) 148/94  Pulse: 81   82  Resp: 18   17  Temp:   97.9 F (36.6 C)   TempSrc:   Axillary   SpO2: 95%   96%  Weight:      Height:      PainSc:  0-No pain      Isolation Precautions Airborne and Contact precautions  Medications Medications  lactated ringers infusion ( Intravenous Not Given 07/12/22 2236)  lactated ringers bolus 1,000 mL (1,000 mLs Intravenous Not Given 07/12/22 2237)   cefTRIAXone (ROCEPHIN) 2 g in sodium chloride 0.9 % 100 mL IVPB (0 g Intravenous Stopped 07/12/22 2228)  enoxaparin (LOVENOX) injection 30 mg (has no administration in time range)  acetaminophen (TYLENOL) tablet 650 mg (has no administration in time range)    Or  acetaminophen (TYLENOL) suppository 650 mg (has no administration in time range)  ondansetron (ZOFRAN) tablet 4 mg (has no administration in time range)    Or  ondansetron (ZOFRAN) injection 4 mg (has no administration in time range)  senna-docusate (Senokot-S) tablet 1 tablet (1 tablet Oral Given 07/13/22 0000)  polyethylene glycol (MIRALAX / GLYCOLAX) packet 17 g (has no administration in time range)  bisacodyl (DULCOLAX) suppository 10 mg (has no administration in time range)  amLODipine (NORVASC) tablet 2.5 mg (2.5 mg Oral Given 07/13/22 0001)  atorvastatin (LIPITOR) tablet 20 mg (has no administration in time range)  Memantine HCl-Donepezil HCl 28-10 MG CP24 1 capsule (has no administration in time range)  multivitamin with minerals tablet 1 tablet (has no administration in time range)  QUEtiapine (SEROQUEL) tablet 100 mg (100 mg Oral Not Given 07/12/22 2330)  hydrALAZINE (APRESOLINE) injection 5-10 mg (has no administration in time range)  metroNIDAZOLE (FLAGYL) IVPB 500 mg (500 mg Intravenous New Bag/Given 07/13/22 0003)  sodium chloride 0.9 % bolus 500 mL (0 mLs Intravenous Stopped 07/12/22 1713)  sodium chloride 0.9 % bolus 500 mL (0 mLs Intravenous Stopped 07/12/22 2158)  magnesium sulfate IVPB 2 g 50 mL (0 g Intravenous Stopped 07/12/22 2315)  iohexol (OMNIPAQUE) 350 MG/ML injection 80 mL (80 mLs Intravenous Contrast Given 07/12/22 2110)    Mobility walks     Focused Assessments Cardiac Assessment Handoff:  Cardiac Rhythm: Normal sinus rhythm Lab Results  Component Value Date   CKTOTAL 88 04/26/2020   CKMB 3.2 09/19/2009   TROPONINI 0.01        NO INDICATION OF MYOCARDIAL INJURY. 09/19/2009  No results found for:  "DDIMER" Does the Patient currently have chest pain? No     Recommendations: See Admitting Provider Note  Report given to:   Additional Notes: patient on [place], family at the bedside

## 2022-07-13 NOTE — Progress Notes (Signed)
Patient arrived to the floor , alert and oriented to self. Cardiac monitoring initiated, CCMD notified we'll continue to monitor.

## 2022-07-13 NOTE — Plan of Care (Signed)
POC initiated and progressing. 

## 2022-07-13 NOTE — Progress Notes (Addendum)
Update: Per EDP, unable to manually get any stool on exam when attempted to disimpact.  Will put on dulcolax PR and senna-s and Miralax for the moment.  Order saline enema or get GI consult if above fails to produce BM.

## 2022-07-14 ENCOUNTER — Inpatient Hospital Stay (HOSPITAL_COMMUNITY): Payer: Medicare PPO

## 2022-07-14 DIAGNOSIS — G309 Alzheimer's disease, unspecified: Secondary | ICD-10-CM | POA: Diagnosis not present

## 2022-07-14 DIAGNOSIS — A419 Sepsis, unspecified organism: Secondary | ICD-10-CM | POA: Diagnosis not present

## 2022-07-14 DIAGNOSIS — I716 Thoracoabdominal aortic aneurysm, without rupture, unspecified: Secondary | ICD-10-CM

## 2022-07-14 DIAGNOSIS — N390 Urinary tract infection, site not specified: Secondary | ICD-10-CM | POA: Diagnosis not present

## 2022-07-14 DIAGNOSIS — K5900 Constipation, unspecified: Secondary | ICD-10-CM | POA: Diagnosis not present

## 2022-07-14 LAB — CBC
HCT: 37 % (ref 36.0–46.0)
Hemoglobin: 11.4 g/dL — ABNORMAL LOW (ref 12.0–15.0)
MCH: 28.5 pg (ref 26.0–34.0)
MCHC: 30.8 g/dL (ref 30.0–36.0)
MCV: 92.5 fL (ref 80.0–100.0)
Platelets: UNDETERMINED 10*3/uL (ref 150–400)
RBC: 4 MIL/uL (ref 3.87–5.11)
RDW: 14.6 % (ref 11.5–15.5)
WBC: 10.6 10*3/uL — ABNORMAL HIGH (ref 4.0–10.5)
nRBC: 0 % (ref 0.0–0.2)

## 2022-07-14 LAB — BASIC METABOLIC PANEL
Anion gap: 11 (ref 5–15)
BUN: 22 mg/dL (ref 8–23)
CO2: 22 mmol/L (ref 22–32)
Calcium: 8.2 mg/dL — ABNORMAL LOW (ref 8.9–10.3)
Chloride: 108 mmol/L (ref 98–111)
Creatinine, Ser: 1.23 mg/dL — ABNORMAL HIGH (ref 0.44–1.00)
GFR, Estimated: 43 mL/min — ABNORMAL LOW (ref >60)
Glucose, Bld: 93 mg/dL (ref 70–99)
Potassium: 3.5 mmol/L (ref 3.5–5.1)
Sodium: 141 mmol/L (ref 135–145)

## 2022-07-14 LAB — LACTIC ACID, PLASMA: Lactic Acid, Venous: 1.7 mmol/L (ref 0.5–1.9)

## 2022-07-14 LAB — MAGNESIUM: Magnesium: 1.9 mg/dL (ref 1.7–2.4)

## 2022-07-14 LAB — URINE CULTURE: Culture: >=100000 — AB

## 2022-07-14 MED ORDER — SORBITOL 70 % SOLN
960.0000 mL | TOPICAL_OIL | Freq: Once | ORAL | Status: AC
  Administered 2022-07-14: 960 mL via RECTAL
  Filled 2022-07-14: qty 240

## 2022-07-14 MED ORDER — CEPHALEXIN 500 MG PO CAPS
500.0000 mg | ORAL_CAPSULE | Freq: Two times a day (BID) | ORAL | Status: DC
  Administered 2022-07-14 – 2022-07-18 (×9): 500 mg via ORAL
  Filled 2022-07-14 (×9): qty 1

## 2022-07-14 NOTE — Progress Notes (Signed)
Palliative Medicine Progress Note   Patient Name: Carol [name]       Date: 07/14/2022 DOB: 07-19-1935  Age: 87 y.o. MRN#: [id number] Attending Physician: Rodena Goldmann, DO Primary Care Physician: Sandrea Hughs, NP Admit Date: 07/12/2022    HPI/Patient Profile: 87 y.o. female  with past medical history of [place], hypertension, and chronic constipation who presented to [facility] ED on 07/12/2022 with several days of being unable to have a BM.  She was admitted for sepsis secondary to UTI and AKI.  She was incidentally found to have an aortic aneurysm with dissection.  Per vascular surgery, this appears to be a chronic issue and she does not appear to be a surgical candidate.   Palliative Medicine was consulted for goals of care.  Subjective: Patient remains pleasantly confused. Daughter Carol [name]) is at bedside. She reports that family has concerns that patient's constipation is not resolved. I reviewed nursing documentation, which notes 2 BM's since admission but there is no mention of amount  I conveyed family's concerns to Dr. Manuella Ghazi, and he has ordered a KUB which shows significant amount of stool.  Per their request, I provided family with a list of hospice agencies, including which agencies also offer outpatient palliative. Family will review and reach out to social work or myself with their decision.    Objective:  Physical Exam Vitals reviewed.  Constitutional:      General: She is awake. She is not in acute distress.    Comments: Chronically ill-appearing  Pulmonary:     Effort: Pulmonary effort is normal.  Psychiatric:        Cognition and Memory: Cognition is impaired.              LBM: Last BM Date : 07/13/22     Palliative Medicine Assessment & Plan    Assessment: Principal Problem:   Sepsis secondary to UTI [facility]) Active Problems:   Mixed Alzheimer's and vascular dementia ([place])   Essential hypertension, benign   Constipation   AKI (acute kidney injury) ([place])   Aortic dissection distal to left [facility])   Aortic aneurysm ([facility])    Recommendations/Plan: Continue current supportive interventions Family considering home hospice versus outpatient palliative Continue bowel regimen - agree with SMOG enema PMT will continue to follow  Primary Decision Maker: 4 daughters together  Code Status/Advance Care Planning: DNR  Prognosis:  < 6 months would not be surprising  Discharge Planning: Home    Thank you for allowing the Palliative Medicine Team to assist in the care of this patient.   Lavena Bullion, NP   Please contact Palliative Medicine Team phone at [phone] for questions and concerns.  For individual providers, please see AMION.

## 2022-07-14 NOTE — Progress Notes (Signed)
PROGRESS NOTE    Carol [name]  WUJ:[id number] DOB: 09/15/1935 DOA: 07/12/2022 PCP: Sandrea Hughs, NP   Brief Narrative:    Carol [name] is a 87 y.o. female with medical history significant of Dementia, HTN, constipation. In to ED today with several days of being unable to have a BM.  Laxatives and enemas at home.   During ED work up pt also found to have: 7+ cm AAA and TAAA. Type B aortic Dissection. Initial hypotension AKI Lactic acidosis.  She was admitted for sepsis secondary to UTI and is also noted to have AKI and incidentally a aortic aneurysm with dissection.  She was seen by vascular surgery and this appears to be a chronic issue and patient does not appear to be a surgical candidate.  She has pulled out multiple IVs this morning and therefore IV fluid and IV antibiotics discontinued.  Palliative consultation to further discuss with family members regarding home hospice versus outpatient palliative care.  Also needs home equipment such as hospital bed and hopefully everything will be set up in the next 24 hours for discharge.  Assessment & Plan:   Principal Problem:   Sepsis secondary to UTI [facility]) Active Problems:   AKI (acute kidney injury) ([place])   Aortic dissection distal to left subclavian (HCC)   Aortic aneurysm (HCC)   Mixed Alzheimer's and vascular dementia ([place])   Essential hypertension, benign   Constipation  Assessment and Plan:  Sepsis secondary to Ecoli UTI, POA -No need for further IV fluid and patient has pulled out IV lines -Rocephin transition to Keflex based on sensitivities, plan to continue for total 3 days   AKI (acute kidney injury) (HCC)-improving Likely pre-renal / ATN in setting of initial BPs in the 70s, poor PO intake. IVF discontinued Strict intake and output Repeat BMP in AM   Aortic aneurysm [facility]) Not candidate for surgery per CVTS and Dr. Virl Cagey. Is at high risk for rupture given size. Pt family interested in pal  care discussion, not pushing for surgery.   Aortic dissection distal to left subclavian [facility]) New diagnosis today; however, appears old and calcified on CT images per rads and vascular surgery.  Dr. Unk Lightning doesn't think this is an acute issue.   Constipation-resolved 2 bowel movements noted 2/4 Continues to have ongoing bowel movements, continue laxation with MiraLAX and Senokot as ordered with suppositories as needed   Essential hypertension, benign Cont home norvasc Hold home lisinopril in setting of AKI PRN hydralazine for SBP > 160 or DBP > 100   Mixed Alzheimer's and vascular dementia ([place]) Pt with fairly advanced dementia at baseline. Cont home dementia meds Family confirmed DNR status to EDP. Pal care consulted at family request with likely need for home hospice which is being set up today along with hospital bed  Hypokalemia/hypomagnesemia Continue to monitor in a.m.  DVT prophylaxis:Lovenox Code Status: DNR Family Communication: Discussed with daughter, Severiano Gilbert on phone 2/5 Disposition Plan:  Status is: Inpatient Remains inpatient appropriate because: Need for ongoing evaluation and management  Consultants:  Vascular surgery, signed off Palliative  Procedures:  None  Antimicrobials:  Anti-infectives (From admission, onward)    Start     Dose/Rate Route Frequency Ordered Stop   07/14/22 1100  cephALEXin (KEFLEX) capsule 500 mg        500 mg Oral Every 12 hours 07/14/22 1005     07/13/22 1115  cefTRIAXone (ROCEPHIN) injection 1 g  Status:  Discontinued  1 g Intramuscular Every 24 hours 07/13/22 1022 07/14/22 1005   07/12/22 2330  metroNIDAZOLE (FLAGYL) IVPB 500 mg        500 mg 100 mL/hr over 60 Minutes Intravenous  Once 07/12/22 2324 07/13/22 0119   07/12/22 1900  cefTRIAXone (ROCEPHIN) 2 g in sodium chloride 0.9 % 100 mL IVPB  Status:  Discontinued        2 g 200 mL/hr over 30 Minutes Intravenous Every 24 hours 07/12/22 1845 07/13/22 1022        Subjective: Patient seen and evaluated today with no new acute complaints or concerns. No acute concerns or events noted overnight.  She seems calm and sleepy, but overall appears to be at baseline.  Objective: Vitals:   07/13/22 1324 07/13/22 1723 07/13/22 2158 07/14/22 1000  BP: 125/77 (!) 147/92 118/77 132/66  Pulse: 83 96 87 85  Resp: 16 17 17 20   Temp: 98.2 F (36.8 C) 98.7 F (37.1 C) 97.7 F (36.5 C) 98.3 F (36.8 C)  TempSrc: Axillary Axillary Oral Oral  SpO2: 99% 96% 100% 98%  Weight:      Height:        Intake/Output Summary (Last 24 hours) at 07/14/2022 1011 Last data filed at 07/14/2022 0900 Gross per 24 hour  Intake --  Output 300 ml  Net -300 ml   Filed Weights   07/12/22 1449  Weight: 77.2 kg    Examination:  General exam: Appears calm and comfortable  Respiratory system: Clear to auscultation. Respiratory effort normal. Cardiovascular system: S1 & S2 heard, RRR.  Gastrointestinal system: Abdomen is soft Central nervous system: Alert and awake Extremities: No edema Skin: No significant lesions noted Psychiatry: Flat affect.    Data Reviewed: I have personally reviewed following labs and imaging studies  CBC: Recent Labs  Lab 07/12/22 1444 07/13/22 0109 07/14/22 0106  WBC 13.7* 12.7* 10.6*  HGB 12.6 12.4 11.4*  HCT 40.1 39.6 37.0  MCV 91.1 90.0 92.5  PLT 115* 134* PLATELET CLUMPS NOTED ON SMEAR, UNABLE TO ESTIMATE   Basic Metabolic Panel: Recent Labs  Lab 07/12/22 1444 07/12/22 1818 07/13/22 0109 07/14/22 0106  NA 139  --  141 141  K 4.1  --  3.3* 3.5  CL 102  --  101 108  CO2 20*  --  24 22  GLUCOSE 125*  --  93 93  BUN 29*  --  25* 22  CREATININE 2.41*  --  1.69* 1.23*  CALCIUM 8.8*  --  8.5* 8.2*  MG  --  1.4*  --  1.9   GFR: Estimated Creatinine Clearance: 34.2 mL/min (A) (by C-G formula based on SCr of 1.23 mg/dL (H)). Liver Function Tests: Recent Labs  Lab 07/13/22 0109  AST 23  ALT 8  ALKPHOS 51  BILITOT 1.1   PROT 7.0  ALBUMIN 2.9*   No results for input(s): "LIPASE", "AMYLASE" in the last 168 hours. No results for input(s): "AMMONIA" in the last 168 hours. Coagulation Profile: No results for input(s): "INR", "PROTIME" in the last 168 hours. Cardiac Enzymes: No results for input(s): "CKTOTAL", "CKMB", "CKMBINDEX", "TROPONINI" in the last 168 hours. BNP (last 3 results) No results for input(s): "PROBNP" in the last 8760 hours. HbA1C: No results for input(s): "HGBA1C" in the last 72 hours. CBG: Recent Labs  Lab 07/12/22 1503  GLUCAP 113*   Lipid Profile: No results for input(s): "CHOL", "HDL", "LDLCALC", "TRIG", "CHOLHDL", "LDLDIRECT" in the last 72 hours. Thyroid Function Tests: No results for input(s): "  TSH", "T4TOTAL", "FREET4", "T3FREE", "THYROIDAB" in the last 72 hours. Anemia Panel: No results for input(s): "VITAMINB12", "FOLATE", "FERRITIN", "TIBC", "IRON", "RETICCTPCT" in the last 72 hours. Sepsis Labs: Recent Labs  Lab 07/12/22 1818 07/12/22 2310 07/13/22 0109 07/14/22 0106  LATICACIDVEN 3.7* 1.3 2.4* 1.7    Recent Results (from the past 240 hour(s))  Urine Culture (for pregnant, neutropenic or urologic patients or patients with an indwelling urinary catheter)     Status: Abnormal   Collection Time: 07/12/22  2:42 PM   Specimen: Urine, Clean Catch  Result Value Ref Range Status   Specimen Description URINE, CLEAN CATCH  Final   Special Requests   Final    NONE Performed at [facility], [place] [address] [place], [place] [address]    Culture >=100,000 COLONIES/mL ESCHERICHIA COLI (A)  Final   Report Status 07/14/2022 FINAL  Final   Organism ID, Bacteria ESCHERICHIA COLI (A)  Final      Susceptibility   Escherichia coli - MIC*    AMPICILLIN 4 SENSITIVE Sensitive     CEFAZOLIN <=4 SENSITIVE Sensitive     CEFEPIME <=0.12 SENSITIVE Sensitive     CEFTRIAXONE <=0.25 SENSITIVE Sensitive     CIPROFLOXACIN <=0.25 SENSITIVE Sensitive     GENTAMICIN <=1 SENSITIVE  Sensitive     IMIPENEM <=0.25 SENSITIVE Sensitive     NITROFURANTOIN <=16 SENSITIVE Sensitive     TRIMETH/SULFA <=20 SENSITIVE Sensitive     AMPICILLIN/SULBACTAM <=2 SENSITIVE Sensitive     PIP/TAZO <=4 SENSITIVE Sensitive     * >=100,000 COLONIES/mL ESCHERICHIA COLI  Blood culture (routine x 2)     Status: None (Preliminary result)   Collection Time: 07/12/22  3:25 PM   Specimen: BLOOD  Result Value Ref Range Status   Specimen Description BLOOD SITE NOT SPECIFIED  Final   Special Requests   Final    BOTTLES DRAWN AEROBIC AND ANAEROBIC Blood Culture adequate volume   Culture   Final    NO GROWTH 2 DAYS Performed at [facility], [address], [place], [place] [address]    Report Status PENDING  Incomplete  Resp panel by RT-PCR (RSV, Flu A&B, Covid) Anterior Nasal Swab     Status: None   Collection Time: 07/12/22  5:35 PM   Specimen: Anterior Nasal Swab  Result Value Ref Range Status   SARS Coronavirus 2 by RT PCR NEGATIVE NEGATIVE Final   Influenza A by PCR NEGATIVE NEGATIVE Final   Influenza B by PCR NEGATIVE NEGATIVE Final    Comment: (NOTE) The Xpert Xpress SARS-CoV-2/FLU/RSV plus assay is intended as an aid in the diagnosis of influenza from Nasopharyngeal swab specimens and should not be used as a sole basis for treatment. Nasal washings and aspirates are unacceptable for Xpert Xpress SARS-CoV-2/FLU/RSV testing.  Fact Sheet for Patients: EntrepreneurPulse.com.au  Fact Sheet for Healthcare Providers: IncredibleEmployment.be  This test is not yet approved or cleared by the [place] FDA and has been authorized for detection and/or diagnosis of SARS-CoV-2 by FDA under an Emergency Use Authorization (EUA). This EUA will remain in effect (meaning this test can be used) for the duration of the COVID-19 declaration under Section 564(b)(1) of the Act, 21 U.S.C. section 360bbb-3(b)(1), unless the authorization is terminated  or revoked.     Resp Syncytial Virus by PCR NEGATIVE NEGATIVE Final    Comment: (NOTE) Fact Sheet for Patients: EntrepreneurPulse.com.au  Fact Sheet for Healthcare Providers: IncredibleEmployment.be  This test is not yet approved or cleared by  the [employer] and has been authorized for detection and/or diagnosis of SARS-CoV-2 by FDA under an Emergency Use Authorization (EUA). This EUA will remain in effect (meaning this test can be used) for the duration of the COVID-19 declaration under Section 564(b)(1) of the Act, 21 U.S.C. section 360bbb-3(b)(1), unless the authorization is terminated or revoked.  Performed at [facility], [address] [place], [place] [address]          Radiology Studies: CT Angio Chest/Abd/Pel for Dissection W and/or Wo Contrast  Addendum Date: 07/12/2022   ADDENDUM REPORT: 07/12/2022 21:55 ADDENDUM: Critical Value/emergent results were called by telephone at the time of interpretation on 07/12/2022 at 9:53 pm to provider Ernie Avena , who verbally acknowledged these results. Electronically Signed   By: Thornell Sartorius M.D.   On: 07/12/2022 21:55   Result Date: 07/12/2022 CLINICAL DATA:  Acute aortic syndrome suspected. Thoracoabdominal aortic aneurysm follow-up. EXAM: CT ANGIOGRAPHY CHEST, ABDOMEN AND PELVIS TECHNIQUE: Non-contrast CT of the chest was initially obtained. Multidetector CT imaging through the chest, abdomen and pelvis was performed using the standard protocol during bolus administration of intravenous contrast. Multiplanar reconstructed images and MIPs were obtained and reviewed to evaluate the vascular anatomy. RADIATION DOSE REDUCTION: This exam was performed according to the departmental dose-optimization program which includes automated exposure control, adjustment of the mA and/or kV according to patient size and/or use of iterative reconstruction technique. CONTRAST:  76mL OMNIPAQUE IOHEXOL  350 MG/ML SOLN COMPARISON:  07/12/2022. FINDINGS: CTA CHEST FINDINGS Cardiovascular: Heart is enlarged and there is no pericardial effusion. Three-vessel coronary artery calcifications are seen. There is atherosclerotic calcification of the aorta with a calcified dissection flap originating in the proximal aortic arch at the origin of the left subclavian artery. Calcified dissection flap is also noted in the mid to distal abdominal aorta. There is aneurysmal dilatation of the ascending aorta measuring 4.2 cm. There is aneurysmal dilatation of the aortic arch measuring up to 7.2 cm there is aneurysmal dilatation of the descending thoracic aorta measuring 5.3 cm. A penetrating ulceration is noted at the aortic arch. There is a focal aneurysm of the ascending aorta measuring 2.7 cm. Mediastinum/Nodes: No mediastinal, hilar, or axillary lymphadenopathy. The thyroid gland, trachea, and esophagus are within normal limits. Lungs/Pleura: Paraseptal and centrilobular emphysematous changes are present in the lungs. Atelectasis is present bilaterally. No effusion or pneumothorax. Musculoskeletal: Degenerative changes are present in the thoracic spine. No acute or suspicious osseous abnormality. Review of the MIP images confirms the above findings. CTA ABDOMEN AND PELVIS FINDINGS VASCULAR Aorta: Aortic atherosclerosis with fusiform aneurysmal dilatation of the proximal abdominal aorta measuring measuring 5.8 x 5.5 cm. There is aneurysmal dilatation of the distal abdominal aorta measuring 3.4 x 3.2 cm. Celiac: Patent without evidence of aneurysm, dissection, vasculitis or significant stenosis. SMA: Patent without evidence of aneurysm, dissection, vasculitis or significant stenosis. Renals: Both renal arteries are patent without evidence of aneurysm, dissection, vasculitis, fibromuscular dysplasia or significant stenosis. IMA: Patent. Inflow: Atherosclerotic calcification with mural thrombus and aneurysmal dilatation of the  common iliac arteries measuring 2.2 cm on the right and 2.7 cm on the left. There is aneurysmal dilatation of the proximal internal iliac artery on the left with thrombus and occlusion of the external iliac artery on the left. There is moderate-to-severe stenosis of the proximal external iliac arteries bilaterally. Veins: No obvious venous abnormality within the limitations of this arterial phase study. Review of the MIP images confirms the above findings. NON-VASCULAR Hepatobiliary: Hypodensities  are present in the liver, most likely representing cysts or hemangiomas. No biliary ductal dilatation. A small stone is present in the gallbladder. Pancreas: Unremarkable. No pancreatic ductal dilatation or surrounding inflammatory changes. Spleen: Normal in size without focal abnormality. Adrenals/Urinary Tract: The adrenal glands are within normal limits. Renal cysts are present bilaterally. The kidneys enhance symmetrically. No renal calculus or hydronephrosis. The bladder is unremarkable. Stomach/Bowel: Stomach is within normal limits. Appendix appears normal. No evidence of bowel wall thickening, distention, or inflammatory changes. No free air or pneumatosis. Large amount of retained stool is present in the rectum with rectal wall thickening and perirectal fat stranding. Lymphatic: No abdominal or pelvic lymphadenopathy. Reproductive: Uterus and bilateral adnexa are unremarkable. Other: No abdominopelvic ascites. Musculoskeletal: Degenerative changes are present in the lumbar spine and right hip. No acute osseous abnormality. Review of the MIP images confirms the above findings. IMPRESSION: 1. Aortic atherosclerosis with multifocal aneurysmal dilatation of the thoracic and abdominal aorta. There is aneurysmal dilatation of the aortic arch measuring up to 7.2 cm with a partially calcified dissection flap originating at the origin of the left subclavian artery. A small penetrating ulcer is also noted in this region.  Aneurysmal dilatation of the ascending aorta measuring 4.2 cm and descending aorta measuring 5.3 cm. Partially calcified dissection flap is noted in the descending thoracic aorta. Cardiothoracic surgery consultation recommended due to increased risk of rupture for arch aneurysm ? 5.5 cm. This recommendation follows 2010 ACCF/AHA/AATS/ACR/ASA/SCA/SCAI/SIR/STS/SVM Guidelines for the Diagnosis and Management of Patients With Thoracic Aortic Disease. Circulation. 2010; 121: F643-P295. Aortic aneurysm NOS (ICD10-I71.9) 2. Multifocal aneurysmal dilatation of the abdominal aorta measuring up to 5.8 cm in the proximal abdominal aorta. Atherosclerotic calcification of the common iliac arteries bilaterally with aneurysmal dilatation of the common iliac arteries measuring up to 2.7 cm on the left. Recommend referral to a vascular specialist. This recommendation follows ACR consensus guidelines: White Paper of the ACR Incidental Findings Committee II on Vascular Findings. J Am Coll Radiol 2013; 10:789-794. 3. 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 4. Moderate-to-severe stenosis involving the origins of the external iliac arteries bilaterally. 5. Large amount of stool in the rectum with rectal wall thickening and perirectal fat stranding, suggesting stercoral colitis. 6. Cardiomegaly with coronary artery calcifications. 7. Remaining chronic findings as described above. Electronically Signed: By: Thornell Sartorius M.D. On: 07/12/2022 21:49   CT ABDOMEN PELVIS WO CONTRAST  Result Date: 07/12/2022 CLINICAL DATA:  Weakness, lethargy, reduced oral intake, hypotension EXAM: CT ABDOMEN AND PELVIS WITHOUT CONTRAST TECHNIQUE: Multidetector CT imaging of the abdomen and pelvis was performed following the standard protocol without IV contrast. RADIATION DOSE REDUCTION: This exam was performed according to the departmental dose-optimization program which includes automated exposure control, adjustment of the mA and/or kV  according to patient size and/or use of iterative reconstruction technique. COMPARISON:  Abdomen radiograph from 07/27/2018 FINDINGS: Lower chest: Mild to moderate cardiomegaly. Aortic and coronary artery atherosclerotic vascular disease. Substantial tortuosity of the lower descending thoracic aorta. Suprarenal aortic aneurysm at the hiatus is fusiform in shape and measures 5.8 by 5.7 cm on image 18 series 3. Aortic valve calcification noted. Mild dependent atelectasis in the lower lobes, right greater than left. Hepatobiliary: Multiple hypodense liver lesions are fluid density and likely cysts although some are technically too small to characterize. Dependent density in the gallbladder is probably from sludge although gallstones cannot be excluded. No biliary dilatation. Pancreas: Unremarkable Spleen: Unremarkable Adrenals/Urinary Tract: Multiple fluid density lesions of both kidneys  compatible with simple cysts. No definite adrenal lesion although the right adrenal gland is somewhat indistinct. Empty urinary bladder. Stomach/Bowel: Prominent rectal stool ball about 480 cc in volume, with perirectal stranding raising the possibility of stercoral colitis. Scattered air-fluid levels in nondilated small bowel, nonspecific. No small bowel dilatation to suggest small bowel obstruction. Vascular/Lymphatic: In addition to the 5.8 cm suprarenal aneurysm, an infrarenal aneurysm measures about 3.3 cm in diameter. There is some mild indistinctness of tissue planes between the abdominal aorta and the IVC although skeptical that this would be a subtle sign sentinel bleed particularly given that the aneurysm in this region is the smaller component. Right common iliac artery caliber 2.3 cm, left common iliac artery caliber 2.5 cm. Aortoiliac atherosclerotic calcification. There is some calcific plaque proximally in the SMA. Reproductive: Unremarkable Other: No supplemental non-categorized findings. Musculoskeletal: Lower thoracic  and lumbar spondylosis and degenerative disc disease. This causes left greater than right foraminal impingement at the L5-S1 level. IMPRESSION: 1. Prominent rectal stool ball about 480 cc in volume, with perirectal stranding raising the possibility of stercoral colitis. 2. 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 3.3 cm infrarenal abdominal aortic aneurysm. There is some mild indistinctness of tissue planes between the abdominal aorta and the IVC although skeptical that this would be a subtle sign of sentinel bleed particularly given that the aneurysm in this region is the smaller component. Recommend referral to a vascular specialist. This recommendation follows ACR consensus guidelines: White Paper of the ACR Incidental Findings Committee II on Vascular Findings. J Am Coll Radiol 2013; 10:789-794. 3. Mild to moderate cardiomegaly with aortic valve calcification. Coronary and systemic atherosclerosis. 4. Multiple hypodense liver lesions are fluid density and likely cysts although some are technically too small to characterize. 5. Dependent density in the gallbladder is probably from sludge although gallstones cannot be excluded. 6. Lower thoracic and lumbar spondylosis and degenerative disc disease causing left greater than right foraminal impingement at L5-S1. Aortic Atherosclerosis (ICD10-I70.0). Electronically Signed   By: Van Clines M.D.   On: 07/12/2022 18:51   CT HEAD WO CONTRAST (5MM)  Result Date: 07/12/2022 CLINICAL DATA:  Altered mental status. Weakness and lethargy with reduced oral intake. Hypotension. EXAM: CT HEAD WITHOUT CONTRAST TECHNIQUE: Contiguous axial images were obtained from the base of the skull through the vertex without intravenous contrast. RADIATION DOSE REDUCTION: This exam was performed according to the departmental dose-optimization program which includes automated exposure control, adjustment of the mA and/or kV according to patient size and/or use of iterative  reconstruction technique. COMPARISON:  01/02/2018 FINDINGS: Brain: The brainstem, cerebellum, cerebral peduncles, thalami, basal ganglia, basilar cisterns, and ventricular system appear within normal limits. Periventricular white matter and corona radiata hypodensities favor chronic ischemic microvascular white matter disease. No intracranial hemorrhage, mass lesion, or acute CVA. Vascular: There is atherosclerotic calcification of the cavernous carotid arteries bilaterally. Skull: Unremarkable Sinuses/Orbits: Chronic right maxillary and mild chronic ethmoid sinusitis. The mucoperiosteal thickening in the right maxillary sinus has speckled internal calcification and fungal involvement is not excluded. This appearance is similar to prior. Other: No supplemental non-categorized findings. IMPRESSION: 1. No acute intracranial findings. 2. Periventricular white matter and corona radiata hypodensities favor chronic ischemic microvascular white matter disease. 3. Atherosclerosis. 4. Chronic right maxillary and mild chronic ethmoid sinusitis. Electronically Signed   By: Van Clines M.D.   On: 07/12/2022 18:39   DG Chest Portable 1 View  Result Date: 07/12/2022 CLINICAL DATA:  Altered mental status EXAM: PORTABLE CHEST 1 VIEW  COMPARISON:  07/09/2018 FINDINGS: Cardiomegaly. Aneurysmal appearance of the aortic arch, which appears enlarged in comparison to prior examination dated 07/09/2018. Both lungs are clear. The visualized skeletal structures are unremarkable. IMPRESSION: 1. Aneurysmal appearance of the aortic arch, which appears enlarged in comparison to prior examination dated 07/09/2018. Recommend CT to further evaluate if clinically appropriate. 2. Cardiomegaly. 3. No acute abnormality of the lungs. Electronically Signed   By: Jearld Lesch M.D.   On: 07/12/2022 15:53        Scheduled Meds:  amLODipine  2.5 mg Oral BID   atorvastatin  20 mg Oral Daily   cephALEXin  500 mg Oral Q12H   donepezil  10  mg Oral Daily   enoxaparin (LOVENOX) injection  30 mg Subcutaneous Q24H   memantine  28 mg Oral Daily   multivitamin with minerals  1 tablet Oral Q supper   polyethylene glycol  17 g Oral Daily   QUEtiapine  100 mg Oral BID   senna-docusate  1 tablet Oral QHS   Continuous Infusions:  lactated ringers       LOS: 2 days    Time spent: 35 minutes    [place] Wank Hoover Brunette, DO [facility]  If 7PM-7AM, please contact night-coverage [email or web address] 07/14/2022, 10:11 AM

## 2022-07-14 NOTE — Progress Notes (Signed)
Patient has a history of confusion, she refuses nursing care and can be combative at time. She is removes her purwick whenever  we place one. She also removes two IV since admission and is no longer on telemetry d/t frequent removals. MD aware.

## 2022-07-15 DIAGNOSIS — A419 Sepsis, unspecified organism: Secondary | ICD-10-CM | POA: Diagnosis not present

## 2022-07-15 DIAGNOSIS — N390 Urinary tract infection, site not specified: Secondary | ICD-10-CM | POA: Diagnosis not present

## 2022-07-15 LAB — BASIC METABOLIC PANEL
Anion gap: 9 (ref 5–15)
BUN: 12 mg/dL (ref 8–23)
CO2: 25 mmol/L (ref 22–32)
Calcium: 8.2 mg/dL — ABNORMAL LOW (ref 8.9–10.3)
Chloride: 107 mmol/L (ref 98–111)
Creatinine, Ser: 1.13 mg/dL — ABNORMAL HIGH (ref 0.44–1.00)
GFR, Estimated: 48 mL/min — ABNORMAL LOW (ref >60)
Glucose, Bld: 104 mg/dL — ABNORMAL HIGH (ref 70–99)
Potassium: 3.5 mmol/L (ref 3.5–5.1)
Sodium: 141 mmol/L (ref 135–145)

## 2022-07-15 LAB — CBC
HCT: 34.4 % — ABNORMAL LOW (ref 36.0–46.0)
Hemoglobin: 11.2 g/dL — ABNORMAL LOW (ref 12.0–15.0)
MCH: 28.8 pg (ref 26.0–34.0)
MCHC: 32.6 g/dL (ref 30.0–36.0)
MCV: 88.4 fL (ref 80.0–100.0)
Platelets: 204 10*3/uL (ref 150–400)
RBC: 3.89 MIL/uL (ref 3.87–5.11)
RDW: 14.5 % (ref 11.5–15.5)
WBC: 8.3 10*3/uL (ref 4.0–10.5)
nRBC: 0 % (ref 0.0–0.2)

## 2022-07-15 LAB — MAGNESIUM: Magnesium: 1.8 mg/dL (ref 1.7–2.4)

## 2022-07-15 NOTE — Care Management Important Message (Signed)
Important Message  Patient Details  Name: Carol [name] MRN: [id number] Date of Birth: 05-12-1936   Medicare Important Message Given:  Yes     Shelda Altes 07/15/2022, 10:55 AM

## 2022-07-15 NOTE — Progress Notes (Addendum)
CM went to room to follow up on transition plans with family, one daughter at bedside who states another daughter is once that is making decisions for their mother and she is not currently here but on her way. Family to let CM know when daughter Jenny Reichmann arrives so that CM can return to discuss families decisions on plan for home w/ hospice vs palliative care.   1500- returned to room to check and see if Jenny Reichmann had arrived yet- family states they are still awaiting for her to get here.

## 2022-07-15 NOTE — TOC Initial Note (Signed)
Transition of Care (TOC) - Initial/Assessment Note  Carol Gibbons RN, BSN Transitions of Care Unit 4E- RN Case Manager See Treatment Team for direct phone #   Patient Details  Name: Carol [name] MRN: [id number] Date of Birth: 1935-09-27  Transition of Care [facility]) CM/SW Contact:    Dawayne Patricia, RN Phone Number: 07/15/2022, 4:25 PM  Clinical Narrative:                 Daughter Otila Kluver arrived to hospital and found CM to follow up on Transition plan and needs for patient.  Per conversation with Otila Kluver family has come to decision to take their mother home with Hospice services. After review of list for Hospice providers- and star ratings per Medicare.gov- family has selected [facility] for Hospice needs.   Per discussion with Otila Kluver, pt has RW at home, however family requesting hospital bed and table as well as BSC. Hospice will make arrangements for DME- per Otila Kluver family will need time to arrange things at home for DME delivery and get things ready to bring pt home- anticipate potential EDD for Thursday pending DME delivery and confirmation of Hospice services.   Pt will transport home vis EMS- address confirmed w/ Otila Kluver, pt will need GOLD DNR for transport.    Family still has some questions for MD- msg sent and MD will address with family in AM- contact for Otila Kluver provided should family not be at bedside.   Call made to [facility] with [facility] for Home Hospice referral- referral pending review. Cheri will reach out to [facility] in am to follow up as per [name]'s request.   TOC to continue to follow to assist with coordination of transition needs.   Expected Discharge Plan: Home w Hospice Care Barriers to Discharge: Other (must enter comment) (Hospice referral and DME delivery)   Patient Goals and CMS Choice Patient states their goals for this hospitalization and ongoing recovery are:: return home w/ family and home hospice CMS Medicare.gov Compare Post Acute  Care list provided to:: Patient Represenative (must comment) Choice offered to / list presented to : Adult Children      Expected Discharge Plan and Services   Discharge Planning Services: CM Consult Post Acute Care Choice: Hospice Living arrangements for the past 2 months: Single Family Home                 DME Arranged: [place] Others, Hospital bed       Representative spoke with at DME Agency: Per [facility]: [facility] Management [place] Agency: [place] Date [place]: 07/15/22 Time [place]: 1624 Representative spoke with at [place]: [place]  Prior Living Arrangements/Services Living arrangements for the past 2 months: [place] with:: Self, Relatives Patient language and need for interpreter reviewed:: Yes Do you feel safe going back to the place where you live?: Yes      Need for Family Participation in Patient Care: Yes (Comment) Care giver support system in place?: Yes (comment) Current home services: DME (RW) Criminal Activity/Legal Involvement Pertinent to Current Situation/Hospitalization: No - Comment as needed  Activities of Daily Living      Permission Sought/Granted Permission sought to share information with : Facility Art therapist granted to share information with : Yes, Verbal Permission Granted  Share Information with NAME: Severiano Gilbert  Permission granted to share info w AGENCY: Hospice  Permission granted to share info  w Relationship: daughter     Emotional Assessment Appearance:: Appears stated age Attitude/Demeanor/Rapport: Unable to Assess Affect (typically observed): Unable to Assess   Alcohol / Substance Use: Not Applicable Psych Involvement: No (comment)  Admission diagnosis:  Acute cystitis without hematuria [N30.00] Sepsis secondary to UTI ([place]) [A41.9, N39.0] Hypotension, unspecified hypotension type [I95.9] Stercoral colitis  [K52.89] Patient Active Problem List   Diagnosis Date Noted   Aortic dissection distal to left subclavian ([place]) 07/12/2022   Aortic aneurysm ([place]) 07/12/2022   Type 2 diabetes mellitus with hyperglycemia, without long-term current use of insulin ([place]) 04/29/2020   Cutaneous candidiasis 03/10/2018   Sepsis ([place]) 01/02/2018   Electrolyte disturbance 08/01/2015   Altered mental status 05/17/2015   Sepsis secondary to UTI ([place]) 05/17/2015   AKI (acute kidney injury) ([place]) 05/17/2015   Dementia with behavioral disturbance ([place]) 06/08/2014   Cataract 01/24/2014   Osteopenia 12/14/2013   Routine general medical examination at a health care facility 12/14/2013   Depression due to dementia ([place]) 09/14/2013   Psychosis ([place]) 03/01/2013   Need for prophylactic vaccination and inoculation against influenza 03/01/2013   Unspecified constipation 03/01/2013   Mixed Alzheimer's and vascular dementia ([place]) 01/26/2013   Essential hypertension, benign 01/26/2013   Hyperlipidemia 01/26/2013   Other disorders of bone and cartilage(733.99) 01/26/2013   Tobacco user 01/26/2013   Constipation 01/26/2013   Tobacco use disorder    Depressive disorder, not elsewhere classified    PCP:  Sandrea Hughs, NP Pharmacy:   [facility] [place] [facility] [address] [place] [address] Phone: [phone] Fax: [phone]     Social Determinants of Health (SDOH) Social History: SDOH Screenings   Food Insecurity: No Food Insecurity (11/11/2017)  Transportation Needs: No Transportation Needs (11/11/2017)  Depression (PHQ2-9): Low Risk  (02/26/2022)  Financial Resource Strain: Low Risk  (11/11/2017)  Physical Activity: Inactive (11/11/2017)  Social Connections: Moderately Integrated (11/11/2017)  Tobacco Use: Medium Risk (07/12/2022)   SDOH Interventions:     Readmission Risk Interventions     No data to display

## 2022-07-15 NOTE — Progress Notes (Signed)
PROGRESS NOTE Carol [name]  KDX:[id number] DOB: 1935-08-15 DOA: 07/12/2022 PCP: Sandrea Hughs, NP   Brief Narrative/Hospital Course: 87 y.o. female with medical history significant of Dementia, HTN, constipation. In to ED today with several days of being unable to have a BM.  Laxatives and enemas at home. In ED work up pt also found to have: 7+ cm AAA and TAAA. Type B aortic Dissection. Initial hypotension AKI Lactic acidosis. She was admitted for sepsis secondary to UTI and is also noted to have AKI and incidentally a aortic aneurysm with dissection.  She was seen by vascular surgery and this appears to be a chronic issue and patient does not appear to be a surgical candidate.  She has pulled out multiple IVs this morning and therefore IV fluid and IV antibiotics discontinued.  Palliative consultation to further discuss with family members regarding home hospice versus outpatient palliative care.  Extensive family discussion held at this time DNR continue supportive intervention and family considering home hospice service versus outpatient palliative care, equipment is being set up.       Subjective: Seen and examined this morning daughter at the bedside patient is alert awake pleasantly confused has no complaint.  Blood pressure close patient had a bowel movement.   Assessment and Plan: Principal Problem:   Sepsis secondary to UTI [facility]) Active Problems:   AKI (acute kidney injury) ([place])   Aortic dissection distal to left subclavian (HCC)   Aortic aneurysm (HCC)   Mixed Alzheimer's and vascular dementia ([place])   Essential hypertension, benign   Constipation   Severe sepsis due to E. coli UTI POA: Patient pulled out IV lines.  Received Rocephin>changed to oral antibiotics to complete course  AKI creatinine improving continue to encourage oral hydration.  No IV lines at this time.  Aortic aneurysm:Not candidate for surgery per CVTS and Dr. Virl Cagey. Is at high risk for rupture given  size.Pt family interested in palliative care   Aortic dissection distal to left subclavian:new diagnosis today; however, appears old and calcified on CT images per rads and vascular surgery.  Dr. Unk Lightning doesn't think this is an acute issue.   Constipation-resolved.  Patient had bowel movements, continue MiraLAX Senokot and Dulcolax suppository as needed instruction provided   Essential hypertension, benign: BP controlled on home norvasc Mixed Alzheimer's and vascular dementia: has fairly advanced dementia at baseline.  Continue home medication continue DNR continue palliative care follow-up continuing home with hospice Hypokalemia/hypomagnesemia: Was replaced.  DVT prophylaxis: enoxaparin (LOVENOX) injection 30 mg Start: 07/13/22 1400 Code Status:   Code Status: DNR Family Communication: plan of care discussed with patient/daughter at bedside. Patient status is: Inpatient because of goals of care discussion and anticipating discharge home with hospice TOC consulted Level of care: Progressive   Dispo: The patient is from: home w/ family            Anticipated disposition: Home with hospice, is medically stable can be discharged once  Objective: Vitals last 24 hrs: Vitals:   07/15/22 0350 07/15/22 0743 07/15/22 0858 07/15/22 1201  BP: (!) 100/58 117/77 101/71 111/73  Pulse: 62 81 81 81  Resp: 20  20   Temp: 98.1 F (36.7 C) 98 F (36.7 C) 98.7 F (37.1 C) 99.2 F (37.3 C)  TempSrc: Oral Oral Oral Axillary  SpO2: 98% 98% 98% 97%  Weight:      Height:       Weight change:   Physical Examination: General exam: alert awake, oriented to self, pleasant  older than stated age HEENT:Oral mucosa moist, Ear/Nose WNL grossly Respiratory system: bilaterally clear BS, no use of accessory muscle Cardiovascular system: S1 & S2 +, No JVD. Gastrointestinal system: Abdomen soft,NT,ND, BS+ Nervous System:Alert, awake, moving extremities. Extremities: LE edema neg,distal peripheral pulses  palpable.  Skin: No rashes,no icterus. MSK: Normal muscle bulk,tone, power  Medications reviewed:  Scheduled Meds:  amLODipine  2.5 mg Oral BID   atorvastatin  20 mg Oral Daily   cephALEXin  500 mg Oral Q12H   donepezil  10 mg Oral Daily   enoxaparin (LOVENOX) injection  30 mg Subcutaneous Q24H   memantine  28 mg Oral Daily   multivitamin with minerals  1 tablet Oral Q supper   polyethylene glycol  17 g Oral Daily   QUEtiapine  100 mg Oral BID   senna-docusate  1 tablet Oral QHS   Continuous Infusions:  lactated ringers      Diet Order             Diet Heart Room service appropriate? Yes; Fluid consistency: Thin  Diet effective now                 No intake or output data in the 24 hours ending 07/15/22 1326 Net IO Since Admission: 398.85 mL [07/15/22 1326]  Wt Readings from Last 3 Encounters:  07/12/22 77.2 kg  04/17/22 77.2 kg  09/04/21 83.4 kg     Unresulted Labs (From admission, onward)    None     Data Reviewed: I have personally reviewed following labs and imaging studies CBC: Recent Labs  Lab 07/12/22 1444 07/13/22 0109 07/14/22 0106 07/15/22 0711  WBC 13.7* 12.7* 10.6* 8.3  HGB 12.6 12.4 11.4* 11.2*  HCT 40.1 39.6 37.0 34.4*  MCV 91.1 90.0 92.5 88.4  PLT 115* 134* PLATELET CLUMPS NOTED ON SMEAR, UNABLE TO ESTIMATE 204   Basic Metabolic Panel: Recent Labs  Lab 07/12/22 1444 07/12/22 1818 07/13/22 0109 07/14/22 0106 07/15/22 0711  NA 139  --  141 141 141  K 4.1  --  3.3* 3.5 3.5  CL 102  --  101 108 107  CO2 20*  --  24 22 25   GLUCOSE 125*  --  93 93 104*  BUN 29*  --  25* 22 12  CREATININE 2.41*  --  1.69* 1.23* 1.13*  CALCIUM 8.8*  --  8.5* 8.2* 8.2*  MG  --  1.4*  --  1.9 1.8  GFR: Estimated Creatinine Clearance: 37.3 mL/min (A) (by C-G formula based on SCr of 1.13 mg/dL (H)). Liver Function Tests: Recent Labs  Lab 07/13/22 0109  AST 23  ALT 8  ALKPHOS 51  BILITOT 1.1  PROT 7.0  ALBUMIN 2.9*   Recent Labs  Lab  07/12/22 1503  GLUCAP 113*   Recent Labs  Lab 07/12/22 1818 07/12/22 2310 07/13/22 0109 07/14/22 0106  LATICACIDVEN 3.7* 1.3 2.4* 1.7    Recent Results (from the past 240 hour(s))  Urine Culture (for pregnant, neutropenic or urologic patients or patients with an indwelling urinary catheter)     Status: Abnormal   Collection Time: 07/12/22  2:42 PM   Specimen: Urine, Clean Catch  Result Value Ref Range Status   Specimen Description URINE, CLEAN CATCH  Final   Special Requests   Final    NONE Performed at [facility], [address], [place], [place]    Culture >=100,000 COLONIES/mL ESCHERICHIA COLI (A)  Final   Report Status 07/14/2022 FINAL  Final   Organism ID, Bacteria ESCHERICHIA COLI (A)  Final      Susceptibility   Escherichia coli - MIC*    AMPICILLIN 4 SENSITIVE Sensitive     CEFAZOLIN <=4 SENSITIVE Sensitive     CEFEPIME <=0.12 SENSITIVE Sensitive     CEFTRIAXONE <=0.25 SENSITIVE Sensitive     CIPROFLOXACIN <=0.25 SENSITIVE Sensitive     GENTAMICIN <=1 SENSITIVE Sensitive     IMIPENEM <=0.25 SENSITIVE Sensitive     NITROFURANTOIN <=16 SENSITIVE Sensitive     TRIMETH/SULFA <=20 SENSITIVE Sensitive     AMPICILLIN/SULBACTAM <=2 SENSITIVE Sensitive     PIP/TAZO <=4 SENSITIVE Sensitive     * >=100,000 COLONIES/mL ESCHERICHIA COLI  Blood culture (routine x 2)     Status: None (Preliminary result)   Collection Time: 07/12/22  3:25 PM   Specimen: BLOOD  Result Value Ref Range Status   Specimen Description BLOOD SITE NOT SPECIFIED  Final   Special Requests   Final    BOTTLES DRAWN AEROBIC AND ANAEROBIC Blood Culture adequate volume   Culture   Final    NO GROWTH 3 DAYS Performed at [facility], [address] [place], [place] [address]    Report Status PENDING  Incomplete  Resp panel by RT-PCR (RSV, Flu A&B, Covid) Anterior Nasal Swab     Status: None   Collection Time: 07/12/22  5:35 PM   Specimen: Anterior Nasal Swab  Result Value Ref  Range Status   SARS Coronavirus 2 by RT PCR NEGATIVE NEGATIVE Final   Influenza A by PCR NEGATIVE NEGATIVE Final   Influenza B by PCR NEGATIVE NEGATIVE Final    Comment: (NOTE) The Xpert Xpress SARS-CoV-2/FLU/RSV plus assay is intended as an aid in the diagnosis of influenza from Nasopharyngeal swab specimens and should not be used as a sole basis for treatment. Nasal washings and aspirates are unacceptable for Xpert Xpress SARS-CoV-2/FLU/RSV testing.  Fact Sheet for Patients: EntrepreneurPulse.com.au  Fact Sheet for Healthcare Providers: IncredibleEmployment.be  This test is not yet approved or cleared by the [place] FDA and has been authorized for detection and/or diagnosis of SARS-CoV-2 by FDA under an Emergency Use Authorization (EUA). This EUA will remain in effect (meaning this test can be used) for the duration of the COVID-19 declaration under Section 564(b)(1) of the Act, 21 U.S.C. section 360bbb-3(b)(1), unless the authorization is terminated or revoked.     Resp Syncytial Virus by PCR NEGATIVE NEGATIVE Final    Comment: (NOTE) Fact Sheet for Patients: EntrepreneurPulse.com.au  Fact Sheet for Healthcare Providers: IncredibleEmployment.be  This test is not yet approved or cleared by the [place] FDA and has been authorized for detection and/or diagnosis of SARS-CoV-2 by FDA under an Emergency Use Authorization (EUA). This EUA will remain in effect (meaning this test can be used) for the duration of the COVID-19 declaration under Section 564(b)(1) of the Act, 21 U.S.C. section 360bbb-3(b)(1), unless the authorization is terminated or revoked.  Performed at [facility], [place] [address], [place], [place] [address]     Antimicrobials: Anti-infectives (From admission, onward)    Start     Dose/Rate Route Frequency Ordered Stop   07/14/22 1100  cephALEXin (KEFLEX) capsule 500 mg         500 mg Oral Every 12 hours 07/14/22 1005     07/13/22 1115  cefTRIAXone (ROCEPHIN) injection 1 g  Status:  Discontinued        1 g Intramuscular Every 24 hours 07/13/22 1022  07/14/22 1005   07/12/22 2330  metroNIDAZOLE (FLAGYL) IVPB 500 mg        500 mg 100 mL/hr over 60 Minutes Intravenous  Once 07/12/22 2324 07/13/22 0119   07/12/22 1900  cefTRIAXone (ROCEPHIN) 2 g in sodium chloride 0.9 % 100 mL IVPB  Status:  Discontinued        2 g 200 mL/hr over 30 Minutes Intravenous Every 24 hours 07/12/22 1845 07/13/22 1022      Culture/Microbiology    Component Value Date/Time   SDES BLOOD SITE NOT SPECIFIED 07/12/2022 1525   SPECREQUEST  07/12/2022 1525    BOTTLES DRAWN AEROBIC AND ANAEROBIC Blood Culture adequate volume   CULT  07/12/2022 1525    NO GROWTH 3 DAYS Performed at [facility], [place] [address], [place], [place] [address]    REPTSTATUS PENDING 07/12/2022 1525  Radiology Studies: DG Abd 1 View  Result Date: 07/14/2022 CLINICAL DATA:  Constipation and abdominal pain for 2 weeks. EXAM: ABDOMEN - 1 VIEW COMPARISON:  07/27/2018. FINDINGS: Stool moderately distends the rectum with mild increased stool sigmoid. Normal bowel gas pattern.  No evidence of obstruction. No evidence of renal or ureteral stones. Aortoiliac atherosclerotic vascular calcifications. Soft tissues otherwise unremarkable. Skeletal structures are demineralized. No acute skeletal abnormality. IMPRESSION: 1. No acute findings. 2. Mild increase in the sigmoid colon stool burden with the rectum moderately distended with stool. Electronically Signed   By: Lajean Manes M.D.   On: 07/14/2022 15:18     LOS: 3 days   Antonieta Pert, MD [facility]  07/15/2022, 1:26 PM

## 2022-07-15 NOTE — Hospital Course (Addendum)
87 y.o. female with medical history significant of Dementia, HTN, constipation. In to ED today with several days of being unable to have a BM.  Laxatives and enemas at home. In ED work up pt also found to have: 7+ cm AAA and TAAA. Type B aortic Dissection. Initial hypotension AKI Lactic acidosis. She was admitted for sepsis secondary to UTI and is also noted to have AKI and incidentally a aortic aneurysm with dissection.  She was seen by vascular surgery and this appears to be a chronic issue and patient does not appear to be a surgical candidate.  She has pulled out multiple IVs this morning and therefore IV fluid and IV antibiotics discontinued.  Palliative consultation to further discuss with family members regarding home hospice versus outpatient palliative care.  Extensive family discussion held at this time DNR continue supportive intervention and family considering home hospice service versus outpatient palliative care, equipment is being set up.  2/9 hospice and equipment were delivered today and patient is being discharged

## 2022-07-16 ENCOUNTER — Inpatient Hospital Stay (HOSPITAL_COMMUNITY): Payer: Medicare PPO

## 2022-07-16 DIAGNOSIS — A419 Sepsis, unspecified organism: Secondary | ICD-10-CM | POA: Diagnosis not present

## 2022-07-16 DIAGNOSIS — N390 Urinary tract infection, site not specified: Secondary | ICD-10-CM | POA: Diagnosis not present

## 2022-07-16 NOTE — Progress Notes (Signed)
PROGRESS NOTE SHAUNIECE [name]  KZS:[id number] DOB: 1936/05/25 DOA: 07/12/2022 PCP: Sandrea Hughs, NP   Brief Narrative/Hospital Course: 87 y.o. female with medical history significant of Dementia, HTN, constipation. In to ED today with several days of being unable to have a BM.  Laxatives and enemas at home. In ED work up pt also found to have: 7+ cm AAA and TAAA. Type B aortic Dissection. Initial hypotension AKI Lactic acidosis. She was admitted for sepsis secondary to UTI and is also noted to have AKI and incidentally a aortic aneurysm with dissection.  She was seen by vascular surgery and this appears to be a chronic issue and patient does not appear to be a surgical candidate.  She has pulled out multiple IVs this morning and therefore IV fluid and IV antibiotics discontinued.  Palliative consultation to further discuss with family members regarding home hospice versus outpatient palliative care.  Extensive family discussion held at this time DNR continue supportive intervention and family considering home hospice service versus outpatient palliative care, equipment is being set up.     Subjective: Seen and examined.  Resting comfortably no new complaints.     Assessment and Plan: Principal Problem:   Sepsis secondary to UTI [name]) Active Problems:   AKI (acute kidney injury) ([place])   Aortic dissection distal to left subclavian (HCC)   Aortic aneurysm (HCC)   Mixed Alzheimer's and vascular dementia ([place])   Essential hypertension, benign   Constipation   Severe sepsis due to E. coli UTI POA: Clinically improved vitals stable continue Keflex to complete the course.Patient pulled out IV lines  AKI creatinine improved to 1.1,continue to encourage oral hydration.  No IV lines at this time.  Aortic aneurysm:Not candidate for surgery per CVTS and Dr. Virl Cagey. Is at high risk for rupture given size.Pt family interested in palliative care> discharging home with hospice.  Continue  antihypertensive and BP appears stable Aortic dissection distal to left subclavian:new diagnosis today; however, appears old and calcified on CT images per rads and vascular surgery.  Dr. Unk Lightning doesn't think this is an acute issue.   Constipation-resolved.  X-ray appears stable this morning continue MiraLAX stool softener and Dulcolax suppository as needed instruction provided   Essential hypertension, benign: BP well controlled on norvasc Mixed Alzheimer's and vascular dementia: has fairly advanced dementia at baseline.  Continue home medication continue DNR continue palliative care follow-up continuing home with hospice Hypokalemia/hypomagnesemia: Was replaced.  DVT prophylaxis: enoxaparin (LOVENOX) injection 30 mg Start: 07/13/22 1400 Code Status:   Code Status: DNR Family Communication: plan of care discussed with patient/daughter at bedside. Patient status is: Inpatient because of goals of care discussion and anticipating discharge home with hospice TOC consulted Level of care: Progressive   Dispo: The patient is from: home w/ family            Anticipated disposition: Home with hospice tomorrow once DME and hospice set up  Objective: Vitals last 24 hrs: Vitals:   07/15/22 1924 07/15/22 2307 07/16/22 0802 07/16/22 1120  BP: 123/77 104/71 128/75 129/77  Pulse: 79 81  77  Resp: 20 18  18   Temp: 98.3 F (36.8 C) 98 F (36.7 C) 98.9 F (37.2 C) 98.9 F (37.2 C)  TempSrc: Oral Oral Axillary Oral  SpO2: 94% 100%  98%  Weight:      Height:       Weight change:   Physical Examination: General exam: alert awake, pleasant confused HEENT:Oral mucosa moist, Ear/Nose WNL grossly Respiratory system: Bilaterally  BS, no use of accessory muscle Cardiovascular system: S1 & S2 +, No JVD. Gastrointestinal system: Abdomen soft,NT,ND, BS+ Nervous System: Alert, awake, moving extremities, shefollows commands. Extremities: LE edema neg,distal peripheral pulses palpable.  Skin: No  rashes,no icterus. MSK: Normal muscle bulk,tone, power  Medications reviewed:  Scheduled Meds:  amLODipine  2.5 mg Oral BID   atorvastatin  20 mg Oral Daily   cephALEXin  500 mg Oral Q12H   donepezil  10 mg Oral Daily   enoxaparin (LOVENOX) injection  30 mg Subcutaneous Q24H   memantine  28 mg Oral Daily   multivitamin with minerals  1 tablet Oral Q supper   polyethylene glycol  17 g Oral Daily   QUEtiapine  100 mg Oral BID   senna-docusate  1 tablet Oral QHS   Continuous Infusions:  lactated ringers      Diet Order             Diet Heart Room service appropriate? Yes; Fluid consistency: Thin  Diet effective now                  Intake/Output Summary (Last 24 hours) at 07/16/2022 1340 Last data filed at 07/16/2022 0000 Gross per 24 hour  Intake 120 ml  Output --  Net 120 ml   Net IO Since Admission: 518.85 mL [07/16/22 1340]  Wt Readings from Last 3 Encounters:  07/12/22 77.2 kg  04/17/22 77.2 kg  09/04/21 83.4 kg     Unresulted Labs (From admission, onward)    None     Data Reviewed: I have personally reviewed following labs and imaging studies CBC: Recent Labs  Lab 07/12/22 1444 07/13/22 0109 07/14/22 0106 07/15/22 0711  WBC 13.7* 12.7* 10.6* 8.3  HGB 12.6 12.4 11.4* 11.2*  HCT 40.1 39.6 37.0 34.4*  MCV 91.1 90.0 92.5 88.4  PLT 115* 134* PLATELET CLUMPS NOTED ON SMEAR, UNABLE TO ESTIMATE 149    Basic Metabolic Panel: Recent Labs  Lab 07/12/22 1444 07/12/22 1818 07/13/22 0109 07/14/22 0106 07/15/22 0711  NA 139  --  141 141 141  K 4.1  --  3.3* 3.5 3.5  CL 102  --  101 108 107  CO2 20*  --  24 22 25   GLUCOSE 125*  --  93 93 104*  BUN 29*  --  25* 22 12  CREATININE 2.41*  --  1.69* 1.23* 1.13*  CALCIUM 8.8*  --  8.5* 8.2* 8.2*  MG  --  1.4*  --  1.9 1.8   GFR: Estimated Creatinine Clearance: 37.3 mL/min (A) (by C-G formula based on SCr of 1.13 mg/dL (H)). Liver Function Tests: Recent Labs  Lab 07/13/22 0109  AST 23  ALT 8  ALKPHOS  51  BILITOT 1.1  PROT 7.0  ALBUMIN 2.9*    Recent Labs  Lab 07/12/22 1503  GLUCAP 113*    Recent Labs  Lab 07/12/22 1818 07/12/22 2310 07/13/22 0109 07/14/22 0106  LATICACIDVEN 3.7* 1.3 2.4* 1.7     Recent Results (from the past 240 hour(s))  Urine Culture (for pregnant, neutropenic or urologic patients or patients with an indwelling urinary catheter)     Status: Abnormal   Collection Time: 07/12/22  2:42 PM   Specimen: Urine, Clean Catch  Result Value Ref Range Status   Specimen Description URINE, CLEAN CATCH  Final   Special Requests   Final    NONE Performed at [facility], [place] [address], [place], [place] [address]  Culture >=100,000 COLONIES/mL ESCHERICHIA COLI (A)  Final   Report Status 07/14/2022 FINAL  Final   Organism ID, Bacteria ESCHERICHIA COLI (A)  Final      Susceptibility   Escherichia coli - MIC*    AMPICILLIN 4 SENSITIVE Sensitive     CEFAZOLIN <=4 SENSITIVE Sensitive     CEFEPIME <=0.12 SENSITIVE Sensitive     CEFTRIAXONE <=0.25 SENSITIVE Sensitive     CIPROFLOXACIN <=0.25 SENSITIVE Sensitive     GENTAMICIN <=1 SENSITIVE Sensitive     IMIPENEM <=0.25 SENSITIVE Sensitive     NITROFURANTOIN <=16 SENSITIVE Sensitive     TRIMETH/SULFA <=20 SENSITIVE Sensitive     AMPICILLIN/SULBACTAM <=2 SENSITIVE Sensitive     PIP/TAZO <=4 SENSITIVE Sensitive     * >=100,000 COLONIES/mL ESCHERICHIA COLI  Blood culture (routine x 2)     Status: None (Preliminary result)   Collection Time: 07/12/22  3:25 PM   Specimen: BLOOD  Result Value Ref Range Status   Specimen Description BLOOD SITE NOT SPECIFIED  Final   Special Requests   Final    BOTTLES DRAWN AEROBIC AND ANAEROBIC Blood Culture adequate volume   Culture   Final    NO GROWTH 4 DAYS Performed at [facility], [address] [place], [place] [address]    Report Status PENDING  Incomplete  Resp panel by RT-PCR (RSV, Flu A&B, Covid) Anterior Nasal Swab     Status: None   Collection Time:  07/12/22  5:35 PM   Specimen: Anterior Nasal Swab  Result Value Ref Range Status   SARS Coronavirus 2 by RT PCR NEGATIVE NEGATIVE Final   Influenza A by PCR NEGATIVE NEGATIVE Final   Influenza B by PCR NEGATIVE NEGATIVE Final    Comment: (NOTE) The Xpert Xpress SARS-CoV-2/FLU/RSV plus assay is intended as an aid in the diagnosis of influenza from Nasopharyngeal swab specimens and should not be used as a sole basis for treatment. Nasal washings and aspirates are unacceptable for Xpert Xpress SARS-CoV-2/FLU/RSV testing.  Fact Sheet for Patients: EntrepreneurPulse.com.au  Fact Sheet for Healthcare Providers: IncredibleEmployment.be  This test is not yet approved or cleared by the [place] FDA and has been authorized for detection and/or diagnosis of SARS-CoV-2 by FDA under an Emergency Use Authorization (EUA). This EUA will remain in effect (meaning this test can be used) for the duration of the COVID-19 declaration under Section 564(b)(1) of the Act, 21 U.S.C. section 360bbb-3(b)(1), unless the authorization is terminated or revoked.     Resp Syncytial Virus by PCR NEGATIVE NEGATIVE Final    Comment: (NOTE) Fact Sheet for Patients: EntrepreneurPulse.com.au  Fact Sheet for Healthcare Providers: IncredibleEmployment.be  This test is not yet approved or cleared by the [place] FDA and has been authorized for detection and/or diagnosis of SARS-CoV-2 by FDA under an Emergency Use Authorization (EUA). This EUA will remain in effect (meaning this test can be used) for the duration of the COVID-19 declaration under Section 564(b)(1) of the Act, 21 U.S.C. section 360bbb-3(b)(1), unless the authorization is terminated or revoked.  Performed at [facility], [place] [address], [place], [place] [address]     Antimicrobials: Anti-infectives (From admission, onward)    Start     Dose/Rate Route  Frequency Ordered Stop   07/14/22 1100  cephALEXin (KEFLEX) capsule 500 mg        500 mg Oral Every 12 hours 07/14/22 1005     07/13/22 1115  cefTRIAXone (ROCEPHIN) injection 1 g  Status:  Discontinued  1 g Intramuscular Every 24 hours 07/13/22 1022 07/14/22 1005   07/12/22 2330  metroNIDAZOLE (FLAGYL) IVPB 500 mg        500 mg 100 mL/hr over 60 Minutes Intravenous  Once 07/12/22 2324 07/13/22 0119   07/12/22 1900  cefTRIAXone (ROCEPHIN) 2 g in sodium chloride 0.9 % 100 mL IVPB  Status:  Discontinued        2 g 200 mL/hr over 30 Minutes Intravenous Every 24 hours 07/12/22 1845 07/13/22 1022      Culture/Microbiology    Component Value Date/Time   SDES BLOOD SITE NOT SPECIFIED 07/12/2022 1525   SPECREQUEST  07/12/2022 1525    BOTTLES DRAWN AEROBIC AND ANAEROBIC Blood Culture adequate volume   CULT  07/12/2022 1525    NO GROWTH 4 DAYS Performed at [facility], [address] [place], [place] [address]    REPTSTATUS PENDING 07/12/2022 1525  Radiology Studies: DG Abd Portable 1V  Result Date: 07/16/2022 CLINICAL DATA:  Constipation. EXAM: PORTABLE ABDOMEN - 1 VIEW COMPARISON:  07/14/2022. FINDINGS: Gaseous distension of the cecum. Gas is seen scattered throughout the colon. Stool in the rectosigmoid colon. No unexpected radiopaque calculi. IMPRESSION: Mild gaseous distension of cecum. Otherwise unremarkable bowel gas pattern. Electronically Signed   By: Leanna Battles M.D.   On: 07/16/2022 10:12   DG Abd 1 View  Result Date: 07/14/2022 CLINICAL DATA:  Constipation and abdominal pain for 2 weeks. EXAM: ABDOMEN - 1 VIEW COMPARISON:  07/27/2018. FINDINGS: Stool moderately distends the rectum with mild increased stool sigmoid. Normal bowel gas pattern.  No evidence of obstruction. No evidence of renal or ureteral stones. Aortoiliac atherosclerotic vascular calcifications. Soft tissues otherwise unremarkable. Skeletal structures are demineralized. No acute skeletal abnormality.  IMPRESSION: 1. No acute findings. 2. Mild increase in the sigmoid colon stool burden with the rectum moderately distended with stool. Electronically Signed   By: Amie Portland M.D.   On: 07/14/2022 15:18     LOS: 4 days   Lanae Boast, MD [facility]  07/16/2022, 1:40 PM

## 2022-07-16 NOTE — Progress Notes (Signed)
   This pt was referred to hospice services for terminal illness. After review of chart and discussion with the pt's daughter Otila Kluver they are interested in proceeding with hospice services at home. She has been approved for Hospice care at home by our MD. I have ordered equipment of transport chair, hospital bed, over bed table, and BSC to be delivered to the home today.  We will continue to follow and admit to hospice services upon d/c home. Webb Silversmith RN  [phone]

## 2022-07-16 NOTE — Discharge Summary (Signed)
Physician Discharge Summary  Carol [name] [id number] DOB: March 23, 1936 DOA: 07/12/2022  PCP: Sandrea Hughs, NP  Admit date: 07/12/2022 Discharge date: 07/18/2022 Recommendations for Outpatient Follow-up:  Follow up with PCP in 1 weeks-call for appointment Please obtain BMP/CBC in one week  Discharge Dispo: home w/ hospice Discharge Condition: Stable Code Status:   Code Status: DNR Diet recommendation:  Diet Order             Diet Heart Room service appropriate? Yes; Fluid consistency: Thin  Diet effective now                    Brief/Interim Summary: 87 y.o. female with medical history significant of Dementia, HTN, constipation. In to ED today with several days of being unable to have a BM.  Laxatives and enemas at home. In ED work up pt also found to have: 7+ cm AAA and TAAA. Type B aortic Dissection. Initial hypotension AKI Lactic acidosis. She was admitted for sepsis secondary to UTI and is also noted to have AKI and incidentally a aortic aneurysm with dissection.  She was seen by vascular surgery and this appears to be a chronic issue and patient does not appear to be a surgical candidate.  She has pulled out multiple IVs this morning and therefore IV fluid and IV antibiotics discontinued.  Palliative consultation to further discuss with family members regarding home hospice versus outpatient palliative care.  Extensive family discussion held at this time DNR continue supportive intervention and family considering home hospice service versus outpatient palliative care, equipment is being set up.  2/9 hospice and equipment were delivered today and patient is being discharged   Discharge Diagnoses:  Principal Problem:   Sepsis secondary to UTI [facility]) Active Problems:   AKI (acute kidney injury) ([place])   Aortic dissection distal to left subclavian ([place])   Aortic aneurysm ([place])   Mixed Alzheimer's and vascular dementia ([place])   Essential hypertension, benign    Constipation  Severe sepsis due to E. coli UTI POA: Clinically improved vitals stable continue Keflex to complete the course AKI creatinine improved to 1.1,continue to encourage oral hydration.  No IV lines at this time. Aortic aneurysm:Not candidate for surgery per CVTS and Dr. Virl Cagey. Is at high risk for rupture given size.Pt family interested in palliative care> discharging home with hospice.  Continue antihypertensive and BP appears stable Aortic dissection distal to left subclavian:new diagnosis today; however, appears old and calcified on CT images per rads and vascular surgery.  Dr. Unk Lightning doesn't think this is an acute issue. Constipation-resolved.  X-ray appears stable 2/7. Continue MiraLAX stool softener and Dulcolax suppository as needed instruction provided Essential hypertension, benign: BP stable on norvasc Mixed Alzheimer's and vascular dementia: has fairly advanced dementia at baseline.  Continue home medication continue DNR continue palliative care follow-up continuing home with hospice Hypokalemia/hypomagnesemia: resolved.  Consults: Vascular, palliative Subjective: Alert awake daughter at bedside  Discharge Exam: Vitals:   07/18/22 0446 07/18/22 0730  BP: 139/86 135/79  Pulse: 81 85  Resp: 20 18  Temp: 98.9 F (37.2 C) 98.7 F (37.1 C)  SpO2: 93% 96%   General: Pt is alert, awake, not in acute distress Cardiovascular: RRR, S1/S2 +, no rubs, no gallops Respiratory: CTA bilaterally, no wheezing, no rhonchi Abdominal: Soft, NT, ND, bowel sounds + Extremities: no edema, no cyanosis  Discharge Instructions   Allergies as of 07/18/2022       Reactions   Penicillins Swelling, Rash   Tolerate ceftriaxone  Medication List     STOP taking these medications    lisinopril 5 MG tablet Commonly known as: ZESTRIL       TAKE these medications    acetaminophen 325 MG tablet Commonly known as: TYLENOL Take 1 tablet (325 mg total) by mouth daily as  needed (pain).   alendronate 70 MG tablet Commonly known as: FOSAMAX TAKE 1 TABLET(70 MG) BY MOUTH 1 TIME A WEEK WITH A FULL GLASS OF WATER AND ON AN EMPTY STOMACH What changed: See the new instructions.   amLODipine 2.5 MG tablet Commonly known as: NORVASC Take 1 tablet (2.5 mg total) by mouth daily. What changed: when to take this   atorvastatin 20 MG tablet Commonly known as: LIPITOR TAKE 1 TABLET(20 MG) BY MOUTH DAILY What changed: See the new instructions.   bisacodyl 10 MG suppository Commonly known as: DULCOLAX Place 1 suppository (10 mg total) rectally daily as needed for moderate constipation.   multivitamin with minerals tablet Take 1 tablet by mouth daily with supper. Centrum Silver   Namzaric 28-10 MG Cp24 Generic drug: Memantine HCl-Donepezil HCl TAKE 1 CAPSULE BY MOUTH DAILY   nystatin powder Commonly known as: MYCOSTATIN/NYSTOP Apply topically 2 (two) times daily. To groin and abdomen area What changed:  how much to take when to take this reasons to take this additional instructions Another medication with the same name was removed. Continue taking this medication, and follow the directions you see here.   polyethylene glycol 17 g packet Commonly known as: MIRALAX / GLYCOLAX Take 17 g by mouth daily. What changed:  when to take this reasons to take this   PROBIOTIC DAILY PO Take by mouth. Culturelle--otc   QUEtiapine 100 MG tablet Commonly known as: SEROQUEL TAKE 1 TABLET(100 MG) BY MOUTH TWICE DAILY What changed: See the new instructions.   senna-docusate 8.6-50 MG tablet Commonly known as: Senokot-S Take 1 tablet by mouth at bedtime. What changed:  when to take this reasons to take this               Durable Medical Equipment  (From admission, onward)           Start     Ordered   07/14/22 1005  For home use only DME Hospital bed  Once       Question Answer Comment  Length of Need Lifetime   The above medical condition  requires: Patient requires the ability to reposition frequently   Head must be elevated greater than: 45 degrees   Bed type Semi-electric      07/14/22 1004            Follow-up Information     [facility] Follow up.   Why: Home Hospice referral Contact information: [place] [phone]               Allergies  Allergen Reactions   Penicillins Swelling and Rash    Tolerate ceftriaxone    The results of significant diagnostics from this hospitalization (including imaging, microbiology, ancillary and laboratory) are listed below for reference.    Microbiology: Recent Results (from the past 240 hour(s))  Urine Culture (for pregnant, neutropenic or urologic patients or patients with an indwelling urinary catheter)     Status: Abnormal   Collection Time: 07/12/22  2:42 PM   Specimen: Urine, Clean Catch  Result Value Ref Range Status   Specimen Description URINE, CLEAN CATCH  Final   Special Requests  Final    NONE Performed at [facility], [place] [address], [place], [place] [address]    Culture >=100,000 COLONIES/mL ESCHERICHIA COLI (A)  Final   Report Status 07/14/2022 FINAL  Final   Organism ID, Bacteria ESCHERICHIA COLI (A)  Final      Susceptibility   Escherichia coli - MIC*    AMPICILLIN 4 SENSITIVE Sensitive     CEFAZOLIN <=4 SENSITIVE Sensitive     CEFEPIME <=0.12 SENSITIVE Sensitive     CEFTRIAXONE <=0.25 SENSITIVE Sensitive     CIPROFLOXACIN <=0.25 SENSITIVE Sensitive     GENTAMICIN <=1 SENSITIVE Sensitive     IMIPENEM <=0.25 SENSITIVE Sensitive     NITROFURANTOIN <=16 SENSITIVE Sensitive     TRIMETH/SULFA <=20 SENSITIVE Sensitive     AMPICILLIN/SULBACTAM <=2 SENSITIVE Sensitive     PIP/TAZO <=4 SENSITIVE Sensitive     * >=100,000 COLONIES/mL ESCHERICHIA COLI  Blood culture (routine x 2)     Status: None   Collection Time: 07/12/22  3:25 PM   Specimen: BLOOD  Result Value Ref  Range Status   Specimen Description BLOOD SITE NOT SPECIFIED  Final   Special Requests   Final    BOTTLES DRAWN AEROBIC AND ANAEROBIC Blood Culture adequate volume   Culture   Final    NO GROWTH 5 DAYS Performed at [facility], [address] [place], [place] [address]    Report Status 07/17/2022 FINAL  Final  Resp panel by RT-PCR (RSV, Flu A&B, Covid) Anterior Nasal Swab     Status: None   Collection Time: 07/12/22  5:35 PM   Specimen: Anterior Nasal Swab  Result Value Ref Range Status   SARS Coronavirus 2 by RT PCR NEGATIVE NEGATIVE Final   Influenza A by PCR NEGATIVE NEGATIVE Final   Influenza B by PCR NEGATIVE NEGATIVE Final    Comment: (NOTE) The Xpert Xpress SARS-CoV-2/FLU/RSV plus assay is intended as an aid in the diagnosis of influenza from Nasopharyngeal swab specimens and should not be used as a sole basis for treatment. Nasal washings and aspirates are unacceptable for Xpert Xpress SARS-CoV-2/FLU/RSV testing.  Fact Sheet for Patients: EntrepreneurPulse.com.au  Fact Sheet for Healthcare Providers: IncredibleEmployment.be  This test is not yet approved or cleared by the [place] FDA and has been authorized for detection and/or diagnosis of SARS-CoV-2 by FDA under an Emergency Use Authorization (EUA). This EUA will remain in effect (meaning this test can be used) for the duration of the COVID-19 declaration under Section 564(b)(1) of the Act, 21 U.S.C. section 360bbb-3(b)(1), unless the authorization is terminated or revoked.     Resp Syncytial Virus by PCR NEGATIVE NEGATIVE Final    Comment: (NOTE) Fact Sheet for Patients: EntrepreneurPulse.com.au  Fact Sheet for Healthcare Providers: IncredibleEmployment.be  This test is not yet approved or cleared by the [place] FDA and has been authorized for detection and/or diagnosis of SARS-CoV-2 by FDA under an Emergency Use  Authorization (EUA). This EUA will remain in effect (meaning this test can be used) for the duration of the COVID-19 declaration under Section 564(b)(1) of the Act, 21 U.S.C. section 360bbb-3(b)(1), unless the authorization is terminated or revoked.  Performed at [facility], [place] [address], [place],  [address]     Procedures/Studies: DG Abd Portable 1V  Result Date: 07/16/2022 CLINICAL DATA:  Constipation. EXAM: PORTABLE ABDOMEN - 1 VIEW COMPARISON:  07/14/2022. FINDINGS: Gaseous distension of the cecum. Gas is seen scattered throughout the colon. Stool in the rectosigmoid  colon. No unexpected radiopaque calculi. IMPRESSION: Mild gaseous distension of cecum. Otherwise unremarkable bowel gas pattern. Electronically Signed   By: Lorin Picket M.D.   On: 07/16/2022 10:12   DG Abd 1 View  Result Date: 07/14/2022 CLINICAL DATA:  Constipation and abdominal pain for 2 weeks. EXAM: ABDOMEN - 1 VIEW COMPARISON:  07/27/2018. FINDINGS: Stool moderately distends the rectum with mild increased stool sigmoid. Normal bowel gas pattern.  No evidence of obstruction. No evidence of renal or ureteral stones. Aortoiliac atherosclerotic vascular calcifications. Soft tissues otherwise unremarkable. Skeletal structures are demineralized. No acute skeletal abnormality. IMPRESSION: 1. No acute findings. 2. Mild increase in the sigmoid colon stool burden with the rectum moderately distended with stool. Electronically Signed   By: Lajean Manes M.D.   On: 07/14/2022 15:18   CT Angio Chest/Abd/Pel for Dissection W and/or Wo Contrast  Addendum Date: 07/12/2022   ADDENDUM REPORT: 07/12/2022 21:55 ADDENDUM: Critical Value/emergent results were called by telephone at the time of interpretation on 07/12/2022 at 9:53 pm to provider Regan Lemming , who verbally acknowledged these results. Electronically Signed   By: Brett Fairy M.D.   On: 07/12/2022 21:55   Result Date: 07/12/2022 CLINICAL DATA:  Acute aortic  syndrome suspected. Thoracoabdominal aortic aneurysm follow-up. EXAM: CT ANGIOGRAPHY CHEST, ABDOMEN AND PELVIS TECHNIQUE: Non-contrast CT of the chest was initially obtained. Multidetector CT imaging through the chest, abdomen and pelvis was performed using the standard protocol during bolus administration of intravenous contrast. Multiplanar reconstructed images and MIPs were obtained and reviewed to evaluate the vascular anatomy. RADIATION DOSE REDUCTION: This exam was performed according to the departmental dose-optimization program which includes automated exposure control, adjustment of the mA and/or kV according to patient size and/or use of iterative reconstruction technique. CONTRAST:  38m OMNIPAQUE IOHEXOL 350 MG/ML SOLN COMPARISON:  07/12/2022. FINDINGS: CTA CHEST FINDINGS Cardiovascular: Heart is enlarged and there is no pericardial effusion. Three-vessel coronary artery calcifications are seen. There is atherosclerotic calcification of the aorta with a calcified dissection flap originating in the proximal aortic arch at the origin of the left subclavian artery. Calcified dissection flap is also noted in the mid to distal abdominal aorta. There is aneurysmal dilatation of the ascending aorta measuring 4.2 cm. There is aneurysmal dilatation of the aortic arch measuring up to 7.2 cm there is aneurysmal dilatation of the descending thoracic aorta measuring 5.3 cm. A penetrating ulceration is noted at the aortic arch. There is a focal aneurysm of the ascending aorta measuring 2.7 cm. Mediastinum/Nodes: No mediastinal, hilar, or axillary lymphadenopathy. The thyroid gland, trachea, and esophagus are within normal limits. Lungs/Pleura: Paraseptal and centrilobular emphysematous changes are present in the lungs. Atelectasis is present bilaterally. No effusion or pneumothorax. Musculoskeletal: Degenerative changes are present in the thoracic spine. No acute or suspicious osseous abnormality. Review of the MIP  images confirms the above findings. CTA ABDOMEN AND PELVIS FINDINGS VASCULAR Aorta: Aortic atherosclerosis with fusiform aneurysmal dilatation of the proximal abdominal aorta measuring measuring 5.8 x 5.5 cm. There is aneurysmal dilatation of the distal abdominal aorta measuring 3.4 x 3.2 cm. Celiac: Patent without evidence of aneurysm, dissection, vasculitis or significant stenosis. SMA: Patent without evidence of aneurysm, dissection, vasculitis or significant stenosis. Renals: Both renal arteries are patent without evidence of aneurysm, dissection, vasculitis, fibromuscular dysplasia or significant stenosis. IMA: Patent. Inflow: Atherosclerotic calcification with mural thrombus and aneurysmal dilatation of the common iliac arteries measuring 2.2 cm on the right and 2.7 cm on the left. There is aneurysmal dilatation of the  proximal internal iliac artery on the left with thrombus and occlusion of the external iliac artery on the left. There is moderate-to-severe stenosis of the proximal external iliac arteries bilaterally. Veins: No obvious venous abnormality within the limitations of this arterial phase study. Review of the MIP images confirms the above findings. NON-VASCULAR Hepatobiliary: Hypodensities are present in the liver, most likely representing cysts or hemangiomas. No biliary ductal dilatation. A small stone is present in the gallbladder. Pancreas: Unremarkable. No pancreatic ductal dilatation or surrounding inflammatory changes. Spleen: Normal in size without focal abnormality. Adrenals/Urinary Tract: The adrenal glands are within normal limits. Renal cysts are present bilaterally. The kidneys enhance symmetrically. No renal calculus or hydronephrosis. The bladder is unremarkable. Stomach/Bowel: Stomach is within normal limits. Appendix appears normal. No evidence of bowel wall thickening, distention, or inflammatory changes. No free air or pneumatosis. Large amount of retained stool is present in the  rectum with rectal wall thickening and perirectal fat stranding. Lymphatic: No abdominal or pelvic lymphadenopathy. Reproductive: Uterus and bilateral adnexa are unremarkable. Other: No abdominopelvic ascites. Musculoskeletal: Degenerative changes are present in the lumbar spine and right hip. No acute osseous abnormality. Review of the MIP images confirms the above findings. IMPRESSION: 1. Aortic atherosclerosis with multifocal aneurysmal dilatation of the thoracic and abdominal aorta. There is aneurysmal dilatation of the aortic arch measuring up to 7.2 cm with a partially calcified dissection flap originating at the origin of the left subclavian artery. A small penetrating ulcer is also noted in this region. Aneurysmal dilatation of the ascending aorta measuring 4.2 cm and descending aorta measuring 5.3 cm. Partially calcified dissection flap is noted in the descending thoracic aorta. Cardiothoracic surgery consultation recommended due to increased risk of rupture for arch aneurysm ? 5.5 cm. This recommendation follows 2010 ACCF/AHA/AATS/ACR/ASA/SCA/SCAI/SIR/STS/SVM Guidelines for the Diagnosis and Management of Patients With Thoracic Aortic Disease. Circulation. 2010; [id number]. Aortic aneurysm NOS (ICD10-I71.9) 2. Multifocal aneurysmal dilatation of the abdominal aorta measuring up to 5.8 cm in the proximal abdominal aorta. Atherosclerotic calcification of the common iliac arteries bilaterally with aneurysmal dilatation of the common iliac arteries measuring up to 2.7 cm on the left. Recommend referral to a vascular specialist. This recommendation follows ACR consensus guidelines: White Paper of the ACR Incidental Findings Committee II on Vascular Findings. J Am Coll Radiol 2013; 10:789-794. 3. 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 4. Moderate-to-severe stenosis involving the origins of the external iliac arteries bilaterally. 5. Large amount of stool in the rectum with rectal wall thickening  and perirectal fat stranding, suggesting stercoral colitis. 6. Cardiomegaly with coronary artery calcifications. 7. Remaining chronic findings as described above. Electronically Signed: By: Brett Fairy M.D. On: 07/12/2022 21:49   CT ABDOMEN PELVIS WO CONTRAST  Result Date: 07/12/2022 CLINICAL DATA:  Weakness, lethargy, reduced oral intake, hypotension EXAM: CT ABDOMEN AND PELVIS WITHOUT CONTRAST TECHNIQUE: Multidetector CT imaging of the abdomen and pelvis was performed following the standard protocol without IV contrast. RADIATION DOSE REDUCTION: This exam was performed according to the departmental dose-optimization program which includes automated exposure control, adjustment of the mA and/or kV according to patient size and/or use of iterative reconstruction technique. COMPARISON:  Abdomen radiograph from 07/27/2018 FINDINGS: Lower chest: Mild to moderate cardiomegaly. Aortic and coronary artery atherosclerotic vascular disease. Substantial tortuosity of the lower descending thoracic aorta. Suprarenal aortic aneurysm at the hiatus is fusiform in shape and measures 5.8 by 5.7 cm on image 18 series 3. Aortic valve calcification noted. Mild dependent atelectasis in the  lower lobes, right greater than left. Hepatobiliary: Multiple hypodense liver lesions are fluid density and likely cysts although some are technically too small to characterize. Dependent density in the gallbladder is probably from sludge although gallstones cannot be excluded. No biliary dilatation. Pancreas: Unremarkable Spleen: Unremarkable Adrenals/Urinary Tract: Multiple fluid density lesions of both kidneys compatible with simple cysts. No definite adrenal lesion although the right adrenal gland is somewhat indistinct. Empty urinary bladder. Stomach/Bowel: Prominent rectal stool ball about 480 cc in volume, with perirectal stranding raising the possibility of stercoral colitis. Scattered air-fluid levels in nondilated small bowel,  nonspecific. No small bowel dilatation to suggest small bowel obstruction. Vascular/Lymphatic: In addition to the 5.8 cm suprarenal aneurysm, an infrarenal aneurysm measures about 3.3 cm in diameter. There is some mild indistinctness of tissue planes between the abdominal aorta and the IVC although skeptical that this would be a subtle sign sentinel bleed particularly given that the aneurysm in this region is the smaller component. Right common iliac artery caliber 2.3 cm, left common iliac artery caliber 2.5 cm. Aortoiliac atherosclerotic calcification. There is some calcific plaque proximally in the SMA. Reproductive: Unremarkable Other: No supplemental non-categorized findings. Musculoskeletal: Lower thoracic and lumbar spondylosis and degenerative disc disease. This causes left greater than right foraminal impingement at the L5-S1 level. IMPRESSION: 1. Prominent rectal stool ball about 480 cc in volume, with perirectal stranding raising the possibility of stercoral colitis. 2. 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 3.3 cm infrarenal abdominal aortic aneurysm. There is some mild indistinctness of tissue planes between the abdominal aorta and the IVC although skeptical that this would be a subtle sign of sentinel bleed particularly given that the aneurysm in this region is the smaller component. Recommend referral to a vascular specialist. This recommendation follows ACR consensus guidelines: White Paper of the ACR Incidental Findings Committee II on Vascular Findings. J Am Coll Radiol 2013; 10:789-794. 3. Mild to moderate cardiomegaly with aortic valve calcification. Coronary and systemic atherosclerosis. 4. Multiple hypodense liver lesions are fluid density and likely cysts although some are technically too small to characterize. 5. Dependent density in the gallbladder is probably from sludge although gallstones cannot be excluded. 6. Lower thoracic and lumbar spondylosis and degenerative disc disease  causing left greater than right foraminal impingement at L5-S1. Aortic Atherosclerosis (ICD10-I70.0). Electronically Signed   By: Van Clines M.D.   On: 07/12/2022 18:51   CT HEAD WO CONTRAST (5MM)  Result Date: 07/12/2022 CLINICAL DATA:  Altered mental status. Weakness and lethargy with reduced oral intake. Hypotension. EXAM: CT HEAD WITHOUT CONTRAST TECHNIQUE: Contiguous axial images were obtained from the base of the skull through the vertex without intravenous contrast. RADIATION DOSE REDUCTION: This exam was performed according to the departmental dose-optimization program which includes automated exposure control, adjustment of the mA and/or kV according to patient size and/or use of iterative reconstruction technique. COMPARISON:  01/02/2018 FINDINGS: Brain: The brainstem, cerebellum, cerebral peduncles, thalami, basal ganglia, basilar cisterns, and ventricular system appear within normal limits. Periventricular white matter and corona radiata hypodensities favor chronic ischemic microvascular white matter disease. No intracranial hemorrhage, mass lesion, or acute CVA. Vascular: There is atherosclerotic calcification of the cavernous carotid arteries bilaterally. Skull: Unremarkable Sinuses/Orbits: Chronic right maxillary and mild chronic ethmoid sinusitis. The mucoperiosteal thickening in the right maxillary sinus has speckled internal calcification and fungal involvement is not excluded. This appearance is similar to prior. Other: No supplemental non-categorized findings. IMPRESSION: 1. No acute intracranial findings. 2. Periventricular white matter and corona radiata  hypodensities favor chronic ischemic microvascular white matter disease. 3. Atherosclerosis. 4. Chronic right maxillary and mild chronic ethmoid sinusitis. Electronically Signed   By: Van Clines M.D.   On: 07/12/2022 18:39   DG Chest Portable 1 View  Result Date: 07/12/2022 CLINICAL DATA:  Altered mental status EXAM:  PORTABLE CHEST 1 VIEW COMPARISON:  07/09/2018 FINDINGS: Cardiomegaly. Aneurysmal appearance of the aortic arch, which appears enlarged in comparison to prior examination dated 07/09/2018. Both lungs are clear. The visualized skeletal structures are unremarkable. IMPRESSION: 1. Aneurysmal appearance of the aortic arch, which appears enlarged in comparison to prior examination dated 07/09/2018. Recommend CT to further evaluate if clinically appropriate. 2. Cardiomegaly. 3. No acute abnormality of the lungs. Electronically Signed   By: Delanna Ahmadi M.D.   On: 07/12/2022 15:53    Labs: BNP (last 3 results) No results for input(s): "BNP" in the last 8760 hours. Basic Metabolic Panel: Recent Labs  Lab 07/12/22 1444 07/12/22 1818 07/13/22 0109 07/14/22 0106 07/15/22 0711  NA 139  --  141 141 141  K 4.1  --  3.3* 3.5 3.5  CL 102  --  101 108 107  CO2 20*  --  24 22 25  $ GLUCOSE 125*  --  93 93 104*  BUN 29*  --  25* 22 12  CREATININE 2.41*  --  1.69* 1.23* 1.13*  CALCIUM 8.8*  --  8.5* 8.2* 8.2*  MG  --  1.4*  --  1.9 1.8   Liver Function Tests: Recent Labs  Lab 07/13/22 0109  AST 23  ALT 8  ALKPHOS 51  BILITOT 1.1  PROT 7.0  ALBUMIN 2.9*   No results for input(s): "LIPASE", "AMYLASE" in the last 168 hours. No results for input(s): "AMMONIA" in the last 168 hours. CBC: Recent Labs  Lab 07/12/22 1444 07/13/22 0109 07/14/22 0106 07/15/22 0711  WBC 13.7* 12.7* 10.6* 8.3  HGB 12.6 12.4 11.4* 11.2*  HCT 40.1 39.6 37.0 34.4*  MCV 91.1 90.0 92.5 88.4  PLT 115* 134* PLATELET CLUMPS NOTED ON SMEAR, UNABLE TO ESTIMATE 204  CBG: Recent Labs  Lab 07/12/22 1503  GLUCAP 113*   Urinalysis    Component Value Date/Time   COLORURINE AMBER (A) 07/12/2022 1444   APPEARANCEUR CLOUDY (A) 07/12/2022 1444   APPEARANCEUR Cloudy (A) 06/01/2015 1225   LABSPEC 1.020 07/12/2022 1444   PHURINE 5.0 07/12/2022 1444   GLUCOSEU NEGATIVE 07/12/2022 1444   HGBUR NEGATIVE 07/12/2022 1444    BILIRUBINUR NEGATIVE 07/12/2022 1444   BILIRUBINUR Neg 11/11/2017 1500   BILIRUBINUR Negative 06/01/2015 1225   KETONESUR 5 (A) 07/12/2022 1444   PROTEINUR 100 (A) 07/12/2022 1444   UROBILINOGEN 0.2 11/11/2017 1500   UROBILINOGEN 0.2 10/26/2010 1137   NITRITE NEGATIVE 07/12/2022 1444   LEUKOCYTESUR MODERATE (A) 07/12/2022 1444   Sepsis Labs Recent Labs  Lab 07/12/22 1444 07/13/22 0109 07/14/22 0106 07/15/22 0711  WBC 13.7* 12.7* 10.6* 8.3   Microbiology Recent Results (from the past 240 hour(s))  Urine Culture (for pregnant, neutropenic or urologic patients or patients with an indwelling urinary catheter)     Status: Abnormal   Collection Time: 07/12/22  2:42 PM   Specimen: Urine, Clean Catch  Result Value Ref Range Status   Specimen Description URINE, CLEAN CATCH  Final   Special Requests   Final    NONE Performed at [facility], [address] [place], [place] [address]    Culture >=100,000 COLONIES/mL ESCHERICHIA COLI (A)  Final   Report  Status 07/14/2022 FINAL  Final   Organism ID, Bacteria ESCHERICHIA COLI (A)  Final      Susceptibility   Escherichia coli - MIC*    AMPICILLIN 4 SENSITIVE Sensitive     CEFAZOLIN <=4 SENSITIVE Sensitive     CEFEPIME <=0.12 SENSITIVE Sensitive     CEFTRIAXONE <=0.25 SENSITIVE Sensitive     CIPROFLOXACIN <=0.25 SENSITIVE Sensitive     GENTAMICIN <=1 SENSITIVE Sensitive     IMIPENEM <=0.25 SENSITIVE Sensitive     NITROFURANTOIN <=16 SENSITIVE Sensitive     TRIMETH/SULFA <=20 SENSITIVE Sensitive     AMPICILLIN/SULBACTAM <=2 SENSITIVE Sensitive     PIP/TAZO <=4 SENSITIVE Sensitive     * >=100,000 COLONIES/mL ESCHERICHIA COLI  Blood culture (routine x 2)     Status: None   Collection Time: 07/12/22  3:25 PM   Specimen: BLOOD  Result Value Ref Range Status   Specimen Description BLOOD SITE NOT SPECIFIED  Final   Special Requests   Final    BOTTLES DRAWN AEROBIC AND ANAEROBIC Blood Culture adequate volume   Culture   Final     NO GROWTH 5 DAYS Performed at [facility], [address] [place], [place] [address]    Report Status 07/17/2022 FINAL  Final  Resp panel by RT-PCR (RSV, Flu A&B, Covid) Anterior Nasal Swab     Status: None   Collection Time: 07/12/22  5:35 PM   Specimen: Anterior Nasal Swab  Result Value Ref Range Status   SARS Coronavirus 2 by RT PCR NEGATIVE NEGATIVE Final   Influenza A by PCR NEGATIVE NEGATIVE Final   Influenza B by PCR NEGATIVE NEGATIVE Final    Comment: (NOTE) The Xpert Xpress SARS-CoV-2/FLU/RSV plus assay is intended as an aid in the diagnosis of influenza from Nasopharyngeal swab specimens and should not be used as a sole basis for treatment. Nasal washings and aspirates are unacceptable for Xpert Xpress SARS-CoV-2/FLU/RSV testing.  Fact Sheet for Patients: EntrepreneurPulse.com.au  Fact Sheet for Healthcare Providers: IncredibleEmployment.be  This test is not yet approved or cleared by the [place] FDA and has been authorized for detection and/or diagnosis of SARS-CoV-2 by FDA under an Emergency Use Authorization (EUA). This EUA will remain in effect (meaning this test can be used) for the duration of the COVID-19 declaration under Section 564(b)(1) of the Act, 21 U.S.C. section 360bbb-3(b)(1), unless the authorization is terminated or revoked.     Resp Syncytial Virus by PCR NEGATIVE NEGATIVE Final    Comment: (NOTE) Fact Sheet for Patients: EntrepreneurPulse.com.au  Fact Sheet for Healthcare Providers: IncredibleEmployment.be  This test is not yet approved or cleared by the [place] FDA and has been authorized for detection and/or diagnosis of SARS-CoV-2 by FDA under an Emergency Use Authorization (EUA). This EUA will remain in effect (meaning this test can be used) for the duration of the COVID-19 declaration under Section 564(b)(1) of the Act, 21 U.S.C. section  360bbb-3(b)(1), unless the authorization is terminated or revoked.  Performed at [facility], [place] [address] [place], [place] [address]   Time coordinating discharge: 25  minutes  SIGNED: Antonieta Pert, MD  [facility] 07/18/2022, 11:46 AM  If 7PM-7AM, please contact night-coverage [email or web address]

## 2022-07-17 DIAGNOSIS — A419 Sepsis, unspecified organism: Secondary | ICD-10-CM | POA: Diagnosis not present

## 2022-07-17 DIAGNOSIS — N390 Urinary tract infection, site not specified: Secondary | ICD-10-CM | POA: Diagnosis not present

## 2022-07-17 LAB — CULTURE, BLOOD (ROUTINE X 2)
Culture: NO GROWTH
Special Requests: ADEQUATE

## 2022-07-17 NOTE — Progress Notes (Signed)
   Received TC from [place] that pt was ready for d/c when equipment was to be delivered. I updated that I have ordered it yesterday and requested delivery yesterday as well.   TC to the daughter Severiano Gilbert to inquire on equipment delivery and that pt is ready for d/c when equipment get in place. She did hear from equipment company last evening and has requested the equipment be delivered Friday. States that she requested am however, the attendant on phone stated they could not guarantee this.   I have updated the CM Christy of the family's request. Webb Silversmith RN [phone]

## 2022-07-17 NOTE — Progress Notes (Signed)
Pt daughter at bedside.  Asked about delivery of equipment for her mom so she could go home and she stated her sister would know, that she stays here to feed Mom.

## 2022-07-17 NOTE — Plan of Care (Signed)
  Problem: Clinical Measurements: Goal: Will remain free from infection Outcome: Progressing Goal: Diagnostic test results will improve Outcome: Progressing   Problem: Activity: Goal: Risk for activity intolerance will decrease Outcome: Progressing   

## 2022-07-17 NOTE — Progress Notes (Signed)
PROGRESS NOTE Carol [name]  MWN:[id number] DOB: 03-09-1936 DOA: 07/12/2022 PCP: Sandrea Hughs, NP   Brief Narrative/Hospital Course: 87 y.o. female with medical history significant of Dementia, HTN, constipation. In to ED today with several days of being unable to have a BM.  Laxatives and enemas at home. In ED work up pt also found to have: 7+ cm AAA and TAAA. Type B aortic Dissection. Initial hypotension AKI Lactic acidosis. She was admitted for sepsis secondary to UTI and is also noted to have AKI and incidentally a aortic aneurysm with dissection.  She was seen by vascular surgery and this appears to be a chronic issue and patient does not appear to be a surgical candidate.  She has pulled out multiple IVs this morning and therefore IV fluid and IV antibiotics discontinued.  Palliative consultation to further discuss with family members regarding home hospice versus outpatient palliative care.  Extensive family discussion held at this time DNR continue supportive intervention and family considering home hospice service versus outpatient palliative care, equipment is being set up.     Subjective: Seen and examined No new complaints Daughter at bedside   Assessment and Plan: Principal Problem:   Sepsis secondary to UTI [facility]) Active Problems:   AKI (acute kidney injury) ([place])   Aortic dissection distal to left subclavian (HCC)   Aortic aneurysm (HCC)   Mixed Alzheimer's and vascular dementia ([place])   Essential hypertension, benign   Constipation   Severe sepsis due to E. coli UTI POA: Clinically improved vitals stable continue Keflex to complete the course AKI creatinine improved to 1.1,continue to encourage oral hydration.  No IV lines at this time.  Aortic aneurysm:Not candidate for surgery per CVTS and Dr. Virl Cagey. Is at high risk for rupture given size.Pt family interested in palliative care> discharging home with hospice.  Continue antihypertensive and BP appears  stable Aortic dissection distal to left subclavian:new diagnosis today; however, appears old and calcified on CT images per rads and vascular surgery.  Dr. Unk Lightning doesn't think this is an acute issue.   Constipation-resolved.  X-ray appears stable 2/7. Continue MiraLAX stool softener and Dulcolax suppository as needed instruction provided   Essential hypertension, benign: BP stable on norvasc Mixed Alzheimer's and vascular dementia: has fairly advanced dementia at baseline.  Continue home medication continue DNR continue palliative care follow-up continuing home with hospice Hypokalemia/hypomagnesemia: resolved  DVT prophylaxis: enoxaparin (LOVENOX) injection 30 mg Start: 07/13/22 1400 Code Status:   Code Status: DNR Family Communication: plan of care discussed with patient/daughter at bedside. Patient status is: Inpatient because of goals of care discussion and anticipating discharge home with hospice TOC consulted Level of care: Progressive   Dispo: The patient is from: home w/ family            Anticipated disposition: Home with hospice  once DME delivered and home hospice set up. She is medically stable  Objective: Vitals last 24 hrs: Vitals:   07/16/22 1951 07/16/22 2348 07/17/22 0317 07/17/22 0744  BP: 120/74 125/79 132/85 138/86  Pulse: 82 80 72   Resp:  20 15 17   Temp: 98.7 F (37.1 C) 99 F (37.2 C) 98 F (36.7 C) 98.2 F (36.8 C)  TempSrc: Oral Oral Oral Oral  SpO2: 99% 97% 98%   Weight:      Height:       Weight change:   Physical Examination: General exam: Aaox1-2, weak,older appearing HEENT:Oral mucosa moist, Ear/Nose WNL grossly, dentition normal. Respiratory system: bilaterally clear BS, no  use of accessory muscle Cardiovascular system: S1 & S2 +, regular rate. Gastrointestinal system: Abdomen soft, NT,ND,BS+ Nervous System:Alert, awake, moving extremities and grossly nonfocal Extremities: LE ankle edema neg, lower extremities warm Skin: No rashes,no  icterus. MSK: Normal muscle bulk,tone, power.   Medications reviewed:  Scheduled Meds:  amLODipine  2.5 mg Oral BID   atorvastatin  20 mg Oral Daily   cephALEXin  500 mg Oral Q12H   donepezil  10 mg Oral Daily   enoxaparin (LOVENOX) injection  30 mg Subcutaneous Q24H   memantine  28 mg Oral Daily   multivitamin with minerals  1 tablet Oral Q supper   polyethylene glycol  17 g Oral Daily   QUEtiapine  100 mg Oral BID   senna-docusate  1 tablet Oral QHS   Continuous Infusions:  lactated ringers      Diet Order             Diet Heart Room service appropriate? Yes; Fluid consistency: Thin  Diet effective now                  Intake/Output Summary (Last 24 hours) at 07/17/2022 1038 Last data filed at 07/17/2022 0500 Gross per 24 hour  Intake 240 ml  Output 152 ml  Net 88 ml    Net IO Since Admission: 556.85 mL [07/17/22 1038]  Wt Readings from Last 3 Encounters:  07/12/22 77.2 kg  04/17/22 77.2 kg  09/04/21 83.4 kg     Unresulted Labs (From admission, onward)    None     Data Reviewed: I have personally reviewed following labs and imaging studies CBC: Recent Labs  Lab 07/12/22 1444 07/13/22 0109 07/14/22 0106 07/15/22 0711  WBC 13.7* 12.7* 10.6* 8.3  HGB 12.6 12.4 11.4* 11.2*  HCT 40.1 39.6 37.0 34.4*  MCV 91.1 90.0 92.5 88.4  PLT 115* 134* PLATELET CLUMPS NOTED ON SMEAR, UNABLE TO ESTIMATE 962    Basic Metabolic Panel: Recent Labs  Lab 07/12/22 1444 07/12/22 1818 07/13/22 0109 07/14/22 0106 07/15/22 0711  NA 139  --  141 141 141  K 4.1  --  3.3* 3.5 3.5  CL 102  --  101 108 107  CO2 20*  --  24 22 25   GLUCOSE 125*  --  93 93 104*  BUN 29*  --  25* 22 12  CREATININE 2.41*  --  1.69* 1.23* 1.13*  CALCIUM 8.8*  --  8.5* 8.2* 8.2*  MG  --  1.4*  --  1.9 1.8   GFR: Estimated Creatinine Clearance: 37.3 mL/min (A) (by C-G formula based on SCr of 1.13 mg/dL (H)). Liver Function Tests: Recent Labs  Lab 07/13/22 0109  AST 23  ALT 8  ALKPHOS 51   BILITOT 1.1  PROT 7.0  ALBUMIN 2.9*    Recent Labs  Lab 07/12/22 1503  GLUCAP 113*    Recent Labs  Lab 07/12/22 1818 07/12/22 2310 07/13/22 0109 07/14/22 0106  LATICACIDVEN 3.7* 1.3 2.4* 1.7     Recent Results (from the past 240 hour(s))  Urine Culture (for pregnant, neutropenic or urologic patients or patients with an indwelling urinary catheter)     Status: Abnormal   Collection Time: 07/12/22  2:42 PM   Specimen: Urine, Clean Catch  Result Value Ref Range Status   Specimen Description URINE, CLEAN CATCH  Final   Special Requests   Final    NONE Performed at [facility], [place] [address] [place], [place] [address]  Culture >=100,000 COLONIES/mL ESCHERICHIA COLI (A)  Final   Report Status 07/14/2022 FINAL  Final   Organism ID, Bacteria ESCHERICHIA COLI (A)  Final      Susceptibility   Escherichia coli - MIC*    AMPICILLIN 4 SENSITIVE Sensitive     CEFAZOLIN <=4 SENSITIVE Sensitive     CEFEPIME <=0.12 SENSITIVE Sensitive     CEFTRIAXONE <=0.25 SENSITIVE Sensitive     CIPROFLOXACIN <=0.25 SENSITIVE Sensitive     GENTAMICIN <=1 SENSITIVE Sensitive     IMIPENEM <=0.25 SENSITIVE Sensitive     NITROFURANTOIN <=16 SENSITIVE Sensitive     TRIMETH/SULFA <=20 SENSITIVE Sensitive     AMPICILLIN/SULBACTAM <=2 SENSITIVE Sensitive     PIP/TAZO <=4 SENSITIVE Sensitive     * >=100,000 COLONIES/mL ESCHERICHIA COLI  Blood culture (routine x 2)     Status: None   Collection Time: 07/12/22  3:25 PM   Specimen: BLOOD  Result Value Ref Range Status   Specimen Description BLOOD SITE NOT SPECIFIED  Final   Special Requests   Final    BOTTLES DRAWN AEROBIC AND ANAEROBIC Blood Culture adequate volume   Culture   Final    NO GROWTH 5 DAYS Performed at [facility], [address] [place], [place] [address]    Report Status 07/17/2022 FINAL  Final  Resp panel by RT-PCR (RSV, Flu A&B, Covid) Anterior Nasal Swab     Status: None   Collection Time: 07/12/22  5:35 PM    Specimen: Anterior Nasal Swab  Result Value Ref Range Status   SARS Coronavirus 2 by RT PCR NEGATIVE NEGATIVE Final   Influenza A by PCR NEGATIVE NEGATIVE Final   Influenza B by PCR NEGATIVE NEGATIVE Final    Comment: (NOTE) The Xpert Xpress SARS-CoV-2/FLU/RSV plus assay is intended as an aid in the diagnosis of influenza from Nasopharyngeal swab specimens and should not be used as a sole basis for treatment. Nasal washings and aspirates are unacceptable for Xpert Xpress SARS-CoV-2/FLU/RSV testing.  Fact Sheet for Patients: EntrepreneurPulse.com.au  Fact Sheet for Healthcare Providers: IncredibleEmployment.be  This test is not yet approved or cleared by the [place] FDA and has been authorized for detection and/or diagnosis of SARS-CoV-2 by FDA under an Emergency Use Authorization (EUA). This EUA will remain in effect (meaning this test can be used) for the duration of the COVID-19 declaration under Section 564(b)(1) of the Act, 21 U.S.C. section 360bbb-3(b)(1), unless the authorization is terminated or revoked.     Resp Syncytial Virus by PCR NEGATIVE NEGATIVE Final    Comment: (NOTE) Fact Sheet for Patients: EntrepreneurPulse.com.au  Fact Sheet for Healthcare Providers: IncredibleEmployment.be  This test is not yet approved or cleared by the [place] FDA and has been authorized for detection and/or diagnosis of SARS-CoV-2 by FDA under an Emergency Use Authorization (EUA). This EUA will remain in effect (meaning this test can be used) for the duration of the COVID-19 declaration under Section 564(b)(1) of the Act, 21 U.S.C. section 360bbb-3(b)(1), unless the authorization is terminated or revoked.  Performed at [facility], [place] [address] [place], [place] [address]     Antimicrobials: Anti-infectives (From admission, onward)    Start     Dose/Rate Route Frequency Ordered Stop    07/14/22 1100  cephALEXin (KEFLEX) capsule 500 mg        500 mg Oral Every 12 hours 07/14/22 1005     07/13/22 1115  cefTRIAXone (ROCEPHIN) injection 1 g  Status:  Discontinued  1 g Intramuscular Every 24 hours 07/13/22 1022 07/14/22 1005   07/12/22 2330  metroNIDAZOLE (FLAGYL) IVPB 500 mg        500 mg 100 mL/hr over 60 Minutes Intravenous  Once 07/12/22 2324 07/13/22 0119   07/12/22 1900  cefTRIAXone (ROCEPHIN) 2 g in sodium chloride 0.9 % 100 mL IVPB  Status:  Discontinued        2 g 200 mL/hr over 30 Minutes Intravenous Every 24 hours 07/12/22 1845 07/13/22 1022      Culture/Microbiology    Component Value Date/Time   SDES BLOOD SITE NOT SPECIFIED 07/12/2022 1525   SPECREQUEST  07/12/2022 1525    BOTTLES DRAWN AEROBIC AND ANAEROBIC Blood Culture adequate volume   CULT  07/12/2022 1525    NO GROWTH 5 DAYS Performed at [name], [address] [place], [place] [address]    REPTSTATUS 07/17/2022 FINAL 07/12/2022 1525  Radiology Studies: DG Abd Portable 1V  Result Date: 07/16/2022 CLINICAL DATA:  Constipation. EXAM: PORTABLE ABDOMEN - 1 VIEW COMPARISON:  07/14/2022. FINDINGS: Gaseous distension of the cecum. Gas is seen scattered throughout the colon. Stool in the rectosigmoid colon. No unexpected radiopaque calculi. IMPRESSION: Mild gaseous distension of cecum. Otherwise unremarkable bowel gas pattern. Electronically Signed   By: Leanna Battles M.D.   On: 07/16/2022 10:12     LOS: 5 days   Lanae Boast, MD [facility]  07/17/2022, 10:38 AM

## 2022-07-17 NOTE — Care Management Important Message (Signed)
Important Message  Patient Details  Name: Carol [name] MRN: [id number] Date of Birth: Jan 17, 1936   Medicare Important Message Given:  Yes     Shelda Altes 07/17/2022, 10:24 AM

## 2022-07-18 DIAGNOSIS — A419 Sepsis, unspecified organism: Secondary | ICD-10-CM | POA: Diagnosis not present

## 2022-07-18 DIAGNOSIS — N390 Urinary tract infection, site not specified: Secondary | ICD-10-CM | POA: Diagnosis not present

## 2022-07-18 MED ORDER — BISACODYL 10 MG RE SUPP: 10.0000 mg | Freq: Every day | RECTAL | 0 refills | Status: DC | PRN

## 2022-07-18 NOTE — TOC Transition Note (Signed)
Transition of Care (TOC) - CM/SW Discharge Note Marvetta Gibbons RN, BSN Transitions of Care Unit 4E- RN Case Manager See Treatment Team for direct phone #   Patient Details  Name: Carol [name] MRN: [id number] Date of Birth: December 09, 1935  Transition of Care [facility]) CM/SW Contact:  Dawayne Patricia, RN Phone Number: 07/18/2022, 11:23 AM   Clinical Narrative:    CM received msg from [facility]- that home DME has been delivered this AM and Hospice is ready on their end for pt to transition home.   MD has cleared pt to transition home, CM spoke with daughter Otila Kluver at the bedside and confirmed family ready for pt to transport home via EMS- address confirmed.   PTAR called at 1110 to place pt on list for transport home- ETA for transport is within the hour to at most hour/half. Daughter updated (daughter going to get briefs for pt to wear home).   [facility] also updated regarding Transport timing.   No further TOC needs noted, PTAR paperwork and GOLD DNR placed on chart.    Final next level of care: Home w Hospice Care Barriers to Discharge: Barriers Resolved   Patient Goals and CMS Choice CMS Medicare.gov Compare Post Acute Care list provided to:: Patient Represenative (must comment) Choice offered to / list presented to : Adult Children  Discharge Placement               Home w/ Hospice          Discharge Plan and Services Additional resources added to the After Visit Summary for     Discharge Planning Services: CM Consult Post Acute Care Choice: Hospice          DME Arranged: [place] Others, Hospital bed       Representative spoke with at DME Agency: Per [facility]: Disease Management [place] Agency: [place] Date [place]: 07/15/22 Time [place]: 1624 Representative spoke with at [place]: [place] Determinants of Health ([place])  Interventions SDOH Screenings   Food Insecurity: No Food Insecurity (11/11/2017)  Transportation Needs: No Transportation Needs (11/11/2017)  Depression (PHQ2-9): Low Risk  (02/26/2022)  Financial Resource Strain: Low Risk  (11/11/2017)  Physical Activity: Inactive (11/11/2017)  Social Connections: Moderately Integrated (11/11/2017)  Tobacco Use: Medium Risk (07/12/2022)     Readmission Risk Interventions    07/18/2022   11:22 AM  Readmission Risk Prevention Plan  Post Dischage Appt Not Complete  Appt Comments Home w/ Hospice  Medication Screening Complete  Transportation Screening Complete

## 2022-07-18 NOTE — Plan of Care (Signed)
?  Problem: Clinical Measurements: ?Goal: Will remain free from infection ?Outcome: Progressing ?Goal: Diagnostic test results will improve ?Outcome: Progressing ?Goal: Respiratory complications will improve ?Outcome: Progressing ?  ?

## 2022-10-04 ENCOUNTER — Other Ambulatory Visit: Payer: Self-pay | Admitting: Family

## 2022-10-04 DIAGNOSIS — F0280 Dementia in other diseases classified elsewhere without behavioral disturbance: Secondary | ICD-10-CM

## 2022-10-16 ENCOUNTER — Other Ambulatory Visit: Payer: Medicare PPO

## 2022-10-20 ENCOUNTER — Ambulatory Visit: Payer: Medicare PPO | Admitting: Family

## 2022-11-08 DEATH — deceased

## 2023-03-04 ENCOUNTER — Encounter: Payer: Medicare PPO | Admitting: Family
